# Patient Record
Sex: Male | Born: 1940
Health system: Southern US, Community
[De-identification: ages and names within clinical notes are randomized; demographics above are authoritative.]

## PROBLEM LIST (undated history)

## (undated) DIAGNOSIS — K760 Fatty (change of) liver, not elsewhere classified: Secondary | ICD-10-CM

## (undated) DIAGNOSIS — I6523 Occlusion and stenosis of bilateral carotid arteries: Secondary | ICD-10-CM

## (undated) DIAGNOSIS — K219 Gastro-esophageal reflux disease without esophagitis: Secondary | ICD-10-CM

## (undated) DIAGNOSIS — K229 Disease of esophagus, unspecified: Secondary | ICD-10-CM

## (undated) DIAGNOSIS — E785 Hyperlipidemia, unspecified: Secondary | ICD-10-CM

## (undated) DIAGNOSIS — K222 Esophageal obstruction: Secondary | ICD-10-CM

## (undated) DIAGNOSIS — R9439 Abnormal result of other cardiovascular function study: Secondary | ICD-10-CM

## (undated) DIAGNOSIS — G20 Parkinson's disease: Secondary | ICD-10-CM

## (undated) DIAGNOSIS — I2510 Atherosclerotic heart disease of native coronary artery without angina pectoris: Secondary | ICD-10-CM

## (undated) DIAGNOSIS — Z9889 Other specified postprocedural states: Secondary | ICD-10-CM

## (undated) DIAGNOSIS — T1490XA Injury, unspecified, initial encounter: Secondary | ICD-10-CM

## (undated) DIAGNOSIS — I4891 Unspecified atrial fibrillation: Secondary | ICD-10-CM

## (undated) DIAGNOSIS — I714 Abdominal aortic aneurysm, without rupture, unspecified: Secondary | ICD-10-CM

## (undated) DIAGNOSIS — K3189 Other diseases of stomach and duodenum: Secondary | ICD-10-CM

## (undated) DIAGNOSIS — Z930 Tracheostomy status: Secondary | ICD-10-CM

## (undated) DIAGNOSIS — S36039A Unspecified laceration of spleen, initial encounter: Secondary | ICD-10-CM

## (undated) DIAGNOSIS — R161 Splenomegaly, not elsewhere classified: Secondary | ICD-10-CM

## (undated) DIAGNOSIS — Z8719 Personal history of other diseases of the digestive system: Secondary | ICD-10-CM

## (undated) DIAGNOSIS — M1990 Unspecified osteoarthritis, unspecified site: Secondary | ICD-10-CM

## (undated) DIAGNOSIS — G20C Parkinsonism, unspecified: Secondary | ICD-10-CM

## (undated) HISTORY — DX: Tracheostomy status: Z93.0

## (undated) HISTORY — DX: Unspecified atrial fibrillation: I48.91

## (undated) HISTORY — DX: Other diseases of stomach and duodenum: K31.89

## (undated) HISTORY — DX: Occlusion and stenosis of bilateral carotid arteries: I65.23

## (undated) HISTORY — DX: Splenomegaly, not elsewhere classified: R16.1

## (undated) HISTORY — DX: Injury, unspecified, initial encounter: T14.90XA

## (undated) HISTORY — PX: CORONARY ARTERY BYPASS GRAFT: SHX141

## (undated) HISTORY — DX: Abdominal aortic aneurysm, without rupture, unspecified: I71.40

## (undated) HISTORY — PX: TONSILLECTOMY: SUR1361

## (undated) HISTORY — DX: Abdominal aortic aneurysm, without rupture: I71.4

## (undated) HISTORY — DX: Disease of esophagus, unspecified: K22.9

## (undated) HISTORY — DX: Hyperlipidemia, unspecified: E78.5

## (undated) HISTORY — DX: Other specified postprocedural states: Z98.890

## (undated) HISTORY — DX: Gastro-esophageal reflux disease without esophagitis: K21.9

## (undated) HISTORY — DX: Unspecified laceration of spleen, initial encounter: S36.039A

## (undated) HISTORY — DX: Atherosclerotic heart disease of native coronary artery without angina pectoris: I25.10

## (undated) HISTORY — DX: Abnormal result of other cardiovascular function study: R94.39

## (undated) HISTORY — PX: CARDIAC CATHETERIZATION: SHX172

## (undated) HISTORY — DX: Fatty (change of) liver, not elsewhere classified: K76.0

## (undated) HISTORY — DX: Esophageal obstruction: K22.2

---

## 1993-05-21 HISTORY — PX: INNER EAR SURGERY: SHX679

## 2002-05-21 HISTORY — PX: COLONOSCOPY: SHX174

## 2009-10-18 ENCOUNTER — Ambulatory Visit: Payer: Self-pay | Admitting: Gastroenterology

## 2009-10-18 ENCOUNTER — Ambulatory Visit (HOSPITAL_COMMUNITY): Admission: EM | Admit: 2009-10-18 | Discharge: 2009-10-18 | Payer: Self-pay | Admitting: Emergency Medicine

## 2009-10-19 ENCOUNTER — Encounter (INDEPENDENT_AMBULATORY_CARE_PROVIDER_SITE_OTHER): Payer: Self-pay | Admitting: *Deleted

## 2009-10-19 ENCOUNTER — Telehealth (INDEPENDENT_AMBULATORY_CARE_PROVIDER_SITE_OTHER): Payer: Self-pay

## 2009-10-21 ENCOUNTER — Ambulatory Visit: Payer: Self-pay | Admitting: Gastroenterology

## 2009-10-26 ENCOUNTER — Telehealth: Payer: Self-pay | Admitting: Gastroenterology

## 2009-10-26 ENCOUNTER — Ambulatory Visit: Payer: Self-pay | Admitting: Gastroenterology

## 2009-10-28 ENCOUNTER — Encounter: Payer: Self-pay | Admitting: Gastroenterology

## 2010-05-21 HISTORY — PX: EYE SURGERY: SHX253

## 2010-06-20 NOTE — Procedures (Signed)
Summary: Upper Endoscopy  Patient: Gregory [name] Note: All result statuses are Final unless otherwise noted.  Tests: (1) Upper Endoscopy (EGD)   EGD Upper Endoscopy       DONE     [facility]     [address]     [place], [place]  [address]           ENDOSCOPY PROCEDURE REPORT           PATIENT:  Gregory [name], Gregory [name]  MR#:  [id number]     BIRTHDATE:  February 16, 1941, 68 yrs. old  GENDER:  male     ENDOSCOPIST:  Judie Petit T. Russella Dar, MD, [facility]     Referred by:  Adela Lank, M.D.     PROCEDURE DATE:  10/18/2009     PROCEDURE:  EGD with foreign body removal     ASA CLASS:  Class II     INDICATIONS:  dysphagia, food impaction: Several weeks of solid     dysphagia and acute dysphagia unable to handle secretions since     Sunday evening     MEDICATIONS:  Fentanyl 75 mcg IV, Versed 7 mg IV     TOPICAL ANESTHETIC:  none     DESCRIPTION OF PROCEDURE:   After the risks benefits and     alternatives of the procedure were thoroughly explained, informed     consent was obtained.  The Pentax EG-[id number] endoscope was     introduced through the mouth and advanced to the second portion of     the duodenum, without limitations.  The instrument was slowly     withdrawn as the mucosa was fully examined.     PROCEDUREIMAGES     Retained food was present in the distal esophagus totally     obstructing the lumen. The food impaction was removed and the     endoscope advanced easily into the stomach. Severve esophagitis     was found in the distal esophagus, likely pressure related. It was     with circumfirential ulcerations and friable. A stricture was     found at the gastroesophageal junction. It was circumferential and     benign appearing. The stomach was entered and closely examined.     The antrum, angularis, and lesser curvature were well visualized,     including a retroflexed view of the cardia and fundus. The stomach     wall was normally distensable. The pylorus was deformed.  The scope     passed easily through the pylorus into the duodenum. The duodenal     bulb was normal in appearance, as was the postbulbar duodenum.     Retroflexed views revealed no abnormalities. The scope was then     withdrawn from the patient and the procedure completed.     COMPLICATIONS:  None           ENDOSCOPIC IMPRESSION:     1) Food, retained in the distal esophagus     2) Esophagitis in the distal esophagus     3) Stricture at the gastroesophageal junction     4) Deformed pylorus     RECOMMENDATIONS:     1) Anti-reflux regimen long term     2) PPI qam long term     3) EGD/Savary in 7-14 days     4) clear liquids until 0900 tomorrow and then only soft foods no     meat or bread until dilation  Venita Lick. Russella Dar, MD, [name]           n.     eSIGNED:   Venita Lick. Jakye Mullens at 10/18/2009 10:36 PM           [name], [id number]  Note: An exclamation mark (!) indicates a result that was not dispersed into the flowsheet. Document Creation Date: 10/19/2009 11:20 AM _______________________________________________________________________  (1) Order result status: Final Collection or observation date-time: 10/18/2009 22:12 Requested date-time:  Receipt date-time:  Reported date-time:  Referring Physician:   Ordering Physician: Claudette Head [phone]) Specimen Source:  Source: Launa Grill Order Number: [id number] Lab site:

## 2010-06-20 NOTE — Letter (Signed)
Summary: Patient [facility] Biopsy Results   Gastroenterology  [address] [place], [place] [address]   Phone: [phone]  Fax: [phone]        October 28, 2009 MRN: [id number]    FARID GRIGORIAN [address] [place], [place]  [address]    Dear Mr. Bezio,  I am pleased to inform you that the biopsies taken during your recent endoscopic examination did not show any evidence of cancer upon pathologic examination. The biopsies showed gastritis.  Continue with the treatment plan as outlined on the day of your      exam.  Please call us if you are having persistent problems or have questions about your condition that have not been fully answered at this time.  Sincerely,  Meryl Dare MD [facility]  This letter has been electronically signed by your physician.  Appended Document: Patient Notice-Endo Biopsy Results letter mailed.

## 2010-06-20 NOTE — Progress Notes (Signed)
Summary: med ?'s  Phone Note Call from Patient Call back at Home Phone [phone]   Caller: Patient Call For: Dr. Russella Dar Reason for Call: Talk to Nurse Summary of Call: EGD this morning and has med questions Initial call taken by: Vallarie Mare,  October 26, 2009 8:46 AM  Follow-up for Phone Call        pt. wanted to know if he should take prilosec this a.m. informed pt that he could take that after proedure is completed.  Follow-up by: Greer Ee RN,  October 26, 2009 8:49 AM

## 2010-06-20 NOTE — Procedures (Signed)
Summary: EGD/food impaction    EGD  Procedure date:  10/18/2009  Findings:      Location: [facility]    ENDOSCOPY PROCEDURE REPORT  PATIENT:  Gregory [name], Gregory [name]  MR#:  [id number] BIRTHDATE:   1940/08/06, 68 yrs. old   GENDER:   male ENDOSCOPIST:   Judie Petit T. Russella Dar, MD, [facility] Referred by: Adela Lank, M.D. PROCEDURE DATE:  10/18/2009 PROCEDURE:  EGD with foreign body removal ASA CLASS:   Class II INDICATIONS: dysphagia, food impaction: Several weeks of solid dysphagia and acute dysphagia unable to handle secretions since Sunday evening  MEDICATIONS:   Fentanyl 75 mcg IV, Versed 7 mg IV TOPICAL ANESTHETIC:   none DESCRIPTION OF PROCEDURE:   After the risks benefits and alternatives of the procedure were thoroughly explained, informed consent was obtained.  The Pentax EG-2770K endoscope was introduced through the mouth and advanced to the second portion of the duodenum, without limitations.  The instrument was slowly withdrawn as the mucosa was fully examined. <<PROCEDUREIMAGES>>          <<OLD IMAGES>> Retained food was present in the distal esophagus totally  obstructing the lumen. The food impaction was removed and the endoscope advanced easily into the stomach. Severve esophagitis was found in the distal esophagus, likely pressure related. It was with circumfirential ulcerations and friable. A stricture was found at the gastroesophageal junction. It was circumferential and benign appearing. The stomach was entered and closely examined. The antrum, angularis, and lesser curvature were well visualized, including a retroflexed view of the cardia and fundus. The stomach wall was normally distensable. The pylorus was deformed. The scope passed easily through the pylorus into the duodenum. The duodenal bulb was normal in appearance, as was the postbulbar duodenum. Retroflexed views revealed no abnormalities. The scope was then withdrawn from the patient and the procedure  completed. COMPLICATIONS:   None  ENDOSCOPIC IMPRESSION:  1) Food, retained in the distal esophagus  2) Esophagitis in the distal esophagus  3) Stricture at the gastroesophageal junction 4) Deformed pylorus RECOMMENDATIONS:  1) Anti-reflux regimen long term  2) PPI qam long term  3) EGD/Savary in 7-14 days 4) clear liquids until 0900 tomorrow and then only soft foods (no meat or bread) until dilation  Taksh Hjort T. Russella Dar, MD, [name]

## 2010-06-20 NOTE — Letter (Signed)
Summary: EGD Instructions  [facility]  [address] [place], [place] [address]   Phone: [phone]  Fax: [phone]       Gregory [name]    30-Nov-1940    MRN: [id number]       Procedure Day Dorna Bloom:  [facility]  10/26/09     Arrival Time: 1:30PM     Procedure Time:  2:30PM     Location of Procedure:                    [facility] (4th Floor)    PREPARATION FOR ENDOSCOPY   On 10/26/09 THE DAY OF THE PROCEDURE:  1.   No solid foods, milk or milk products are allowed after midnight the night before your procedure.  2.   Do not drink anything colored red or purple.  Avoid juices with pulp.  No orange juice.  3.  You may drink clear liquids until 12:30PM, which is 2 hours before your procedure.                                                                                                CLEAR LIQUIDS INCLUDE: Water Jello Ice Popsicles Tea (sugar ok, no milk/cream) Powdered fruit flavored drinks Coffee (sugar ok, no milk/cream) Gatorade Juice: apple, white grape, white cranberry  Lemonade Clear bullion, consomm, broth Carbonated beverages (any kind) Strained chicken noodle soup Hard Candy   MEDICATION INSTRUCTIONS  Unless otherwise instructed, you should take regular prescription medications with a small sip of water as early as possible the morning of your procedure.          OTHER INSTRUCTIONS  You will need a responsible adult at least 71 years of age to accompany you and drive you home.   This person must remain in the waiting room during your procedure.  Wear loose fitting clothing that is easily removed.  Leave jewelry and other valuables at home.  However, you may wish to bring a book to read or an iPod/MP3 player to listen to music as you wait for your procedure to start.  Remove all body piercing jewelry and leave at home.  Total time from sign-in until discharge is approximately 2-3 hours.  You should go home  directly after your procedure and rest.  You can resume normal activities the day after your procedure.  The day of your procedure you should not:   Drive   Make legal decisions   Operate machinery   Drink alcohol   Return to work  You will receive specific instructions about eating, activities and medications before you leave.    The above instructions have been reviewed and explained to me by   Wyona Almas RN  October 21, 2009 3:18 PM     I fully understand and can verbalize these instructions _____________________________ Date _________

## 2010-06-20 NOTE — Miscellaneous (Signed)
Summary: [facility] Previsit/prep  Clinical Lists Changes  Observations: Added new observation of NKA: T (10/21/2009 14:44)

## 2010-06-20 NOTE — Progress Notes (Signed)
Summary: Schedule repeat EGD  Phone Note Outgoing Call Call back at [facility] Phone [phone]   Call placed by: Darcey Nora RN, CGRN,  October 19, 2009 11:15 AM Call placed to: Patient Summary of Call: Patient  had food impaction and had EGD last pm by Dr Russella Dar.  Per [facility] report patient to be scheduled for repeat ESA in [facility], this is scheduled for 10/26/09 2:30 patient will come for a pre-visit 10/21/09 2:30 for instructions and paperwork.  Omeprazole rx will be sent to [facility] per patient request.  He will double up on his Prilosec for now.  Reviewed again with the patient and the wife, he needs to avoid meat and bread and stay on a soft diet. Initial call taken by: Darcey Nora RN, CGRN,  October 19, 2009 11:18 AM    New/Updated Medications: OMEPRAZOLE 40 MG CPDR (OMEPRAZOLE) 1 by mouth once daily 30 minutes before the first meal of the day Prescriptions: OMEPRAZOLE 40 MG CPDR (OMEPRAZOLE) 1 by mouth once daily 30 minutes before the first meal of the day  #90 x 3   Entered by:   Darcey Nora RN, CGRN   Authorized by:   Meryl Dare MD [facility]   Signed by:   Darcey Nora RN, CGRN on 10/19/2009   Method used:   Electronically to        [facility] [facility]* (mail-order)             ,          Ph: [phone]       Fax: [phone]   RxID:   [id number]

## 2010-06-20 NOTE — Procedures (Signed)
Summary: Upper Endoscopy w/DIL  Patient: [name] Note: All result statuses are Final unless otherwise noted.  Tests: (1) Upper Endoscopy w/DIL (UED)  UED Upper Endoscopy w/DIL                             DONE     [facility]     [address]     [place], [place]  [address]           ENDOSCOPY PROCEDURE REPORT           PATIENT:  Gregory [name], Gregory [name]  MR#:  [id number]     BIRTHDATE:  [birth year], 68 yrs. old  GENDER:  male     ENDOSCOPIST:  Judie Petit T. Russella Dar, MD, [facility]           PROCEDURE DATE:  10/26/2009     PROCEDURE:  EGD with biopsy, with dilatation over guidewire     ASA CLASS:  Class II     INDICATIONS:  1) stricture  2) dysphagia     MEDICATIONS:  Fentanyl 100 mcg IV, Versed 8 mg IV     TOPICAL ANESTHETIC:  Exactacain Spray     DESCRIPTION OF PROCEDURE:   After the risks benefits and     alternatives of the procedure were thoroughly explained, informed     consent was obtained.  The [id number] endoscope was     introduced through the mouth and advanced to the second portion of     the duodenum, without limitations.  The instrument was slowly     withdrawn as the mucosa was carefully examined.     <<PROCEDUREIMAGES>>     Moderate gastritis was found in the total stomach. It was granular     and erythematous. Biopsies of the antrum and body of the stomach     were obtained and sent to pathology.  Erythema was found in the     distal esophagus.  A stricture was found at the gastroesophageal     junction. It was circumferential and benign appearing. It was 14     mm in diameter. The duodenal bulb was normal in appearance, as was     the postbulbar duodenum.  A hiatal hernia was found in the cardia,     small.   Dilation was then performed at the gastroesphageal     junction:           Dilator:  Savary over guidewire  Sizes:  15, 16, 17 mm     Resistance:  minimal  Heme:  none           COMPLICATIONS:  None           ENDOSCOPIC IMPRESSION:     1)  Moderate diffuse gastritis     2) Erythema in the distal esophagus     3) 14 mm stricture at the gastroesophageal junction     4) Small hiatal hernia     RECOMMENDATIONS:     1) await pathology results     2) anti-reflux regimen long term     3) continue PPI qam long term     4) call office to schedule an office visit for 12 months     5) post dilation instructions           Briceida Rasberry T. Russella Dar, MD, [name]           CC:  Adela Lank, M.D.  n.     eSIGNED:   Artemis Koller T. Haris Baack at 10/26/2009 02:42 PM           [name], [id number]  Note: An exclamation mark (!) indicates a result that was not dispersed into the flowsheet. Document Creation Date: 10/26/2009 2:43 PM _______________________________________________________________________  (1) Order result status: Final Collection or observation date-time: 10/26/2009 14:36 Requested date-time:  Receipt date-time:  Reported date-time:  Referring Physician:   Ordering Physician: Claudette Head [phone]) Specimen Source:  Source: Launa Grill Order Number: [id number] Lab site:

## 2010-08-07 LAB — COMPREHENSIVE METABOLIC PANEL
AST: 21 U/L (ref 0–37)
Albumin: 4.2 g/dL (ref 3.5–5.2)
Alkaline Phosphatase: 71 U/L (ref 39–117)
BUN: 33 mg/dL — ABNORMAL HIGH (ref 6–23)
Calcium: 9.1 mg/dL (ref 8.4–10.5)
Chloride: 102 mEq/L (ref 96–112)
GFR calc Af Amer: 57 mL/min — ABNORMAL LOW (ref >60)
Glucose, Bld: 130 mg/dL — ABNORMAL HIGH (ref 70–99)
Sodium: 137 mEq/L (ref 135–145)

## 2010-08-07 LAB — CBC
MCHC: 34.3 g/dL (ref 30.0–36.0)
Platelets: 298 10*3/uL (ref 150–400)

## 2010-08-07 LAB — LIPASE, BLOOD: Lipase: 19 U/L (ref 11–59)

## 2010-08-07 LAB — DIFFERENTIAL
Basophils Absolute: 0 10*3/uL (ref 0.0–0.1)
Basophils Relative: 0 % (ref 0–1)
Neutrophils Relative %: 82 % — ABNORMAL HIGH (ref 43–77)

## 2010-08-31 ENCOUNTER — Other Ambulatory Visit: Payer: Self-pay | Admitting: Gastroenterology

## 2011-02-07 ENCOUNTER — Telehealth: Payer: Self-pay | Admitting: Gastroenterology

## 2011-02-07 NOTE — Telephone Encounter (Signed)
the patient is advised to try increase his omeprazole to bid, gas -x ac meals, Patient instructed to maintain an anti-reflux diet. Advised to avoid caffeine, mint, citrus foods/juices, tomatoes,  chocolate, NSAIDS/ASA products.  Instructed not to eat within 2 hours of exercise or bed, multiple small meals are better than 3 large meals.  Need to take PPI 30 minutes prior to 1st meal of the day and at bedtime.  He is asked to keep the appt with Dr Russella Dar on 03/06/11.  I will mail him an antireflux diet and measures.  [name]

## 2011-03-06 ENCOUNTER — Ambulatory Visit (INDEPENDENT_AMBULATORY_CARE_PROVIDER_SITE_OTHER): Payer: 59 | Admitting: Gastroenterology

## 2011-03-06 ENCOUNTER — Encounter: Payer: Self-pay | Admitting: Gastroenterology

## 2011-03-06 VITALS — BP 132/82 | HR 87 | Wt 213.4 lb

## 2011-03-06 DIAGNOSIS — K219 Gastro-esophageal reflux disease without esophagitis: Secondary | ICD-10-CM | POA: Insufficient documentation

## 2011-03-06 DIAGNOSIS — R079 Chest pain, unspecified: Secondary | ICD-10-CM

## 2011-03-06 MED ORDER — OMEPRAZOLE 40 MG PO CPDR: 40.0000 mg | DELAYED_RELEASE_CAPSULE | Freq: Two times a day (BID) | ORAL | Status: DC

## 2011-03-06 NOTE — Patient Instructions (Signed)
Your prescription for omeprazole 40 mg to take one tablet by mouth twice daily has been sent to your pharmacy.  Please keep your Cardiology appointment for further evaluation of your chest pain.  cc: Darci Needle, MD

## 2011-03-06 NOTE — Progress Notes (Signed)
History of Present Illness: This is a 70 year old male who has a history of GERD, an esophageal stricture and a prior food impaction. He underwent upper endoscopy with dilation last year. His reflux symptoms have generally been under good control with occasional breakthrough symptoms related to tomato sauce and other dietary stressors. He has had several episodes of nighttime reflux mainly following dietary stressors. He describes it as a tightness across his chest that awoke him on several occasions. He also has had similar symptoms related to exertion during the daytime. He was evaluated by Dr. Juleen China, his primary physician, and was referred to see me and [facility] cardiology. He has not had his cardiology appointment. His omeprazole was increased from 40 mg daily to 40 mg twice daily and his symptoms have improved but not resolved. Denies weight loss, abdominal pain, constipation, diarrhea, change in stool caliber, melena, hematochezia, nausea, vomiting, dysphagia.  Current Medications, Allergies, Past Medical History, Past Surgical History, Family History and Social History were reviewed in [employer] record.  Physical Exam: General: Well developed , well nourished, no acute distress Head: Normocephalic and atraumatic Eyes:  sclerae anicteric, EOMI Ears: Normal auditory acuity Mouth: No deformity or lesions Lungs: Clear throughout to auscultation Heart: Regular rate and rhythm; no murmurs, rubs or bruits Abdomen: Soft, non tender and non distended. No masses, hepatosplenomegaly or hernias noted. Normal Bowel sounds Musculoskeletal: Symmetrical with no gross deformities  Pulses:  Normal pulses noted Extremities: No clubbing, cyanosis, edema or deformities noted Neurological: Alert oriented x 4, grossly nonfocal Psychological:  Alert and cooperative. Normal mood and affect  Assessment and Recommendations:  1. GERD with a history of an esophageal stricture. One  component of his chest pain appears to be related to GERD. Continue omeprazole 40 mg twice daily and all standard antireflux measures. May use TUMS or Maalox when necessary.  2. Chest pain. One component is exertional and needs further evaluation by cardiology. I strongly recommended that he sees his cardiologist soon as possible.

## 2011-08-01 ENCOUNTER — Other Ambulatory Visit: Payer: Self-pay | Admitting: Endocrinology

## 2011-08-01 DIAGNOSIS — R1013 Epigastric pain: Secondary | ICD-10-CM

## 2011-08-01 DIAGNOSIS — K219 Gastro-esophageal reflux disease without esophagitis: Secondary | ICD-10-CM

## 2011-08-03 ENCOUNTER — Ambulatory Visit
Admission: RE | Admit: 2011-08-03 | Discharge: 2011-08-03 | Disposition: A | Discharge status: Home / Self Care | Payer: 59 | Source: Ambulatory Visit | Attending: Endocrinology | Admitting: Endocrinology

## 2011-08-03 DIAGNOSIS — R1013 Epigastric pain: Secondary | ICD-10-CM

## 2011-08-03 DIAGNOSIS — K219 Gastro-esophageal reflux disease without esophagitis: Secondary | ICD-10-CM

## 2011-08-28 ENCOUNTER — Telehealth: Payer: Self-pay | Admitting: Gastroenterology

## 2011-08-28 NOTE — Telephone Encounter (Signed)
Patient reported that he is having continued reflux.  He wants to schedule an office visit with Dr Russella Dar.  I have scheduled an appt for 09/19/11

## 2011-09-19 ENCOUNTER — Encounter: Payer: Self-pay | Admitting: Gastroenterology

## 2011-09-19 ENCOUNTER — Ambulatory Visit (INDEPENDENT_AMBULATORY_CARE_PROVIDER_SITE_OTHER): Payer: 59 | Admitting: Gastroenterology

## 2011-09-19 VITALS — BP 120/72 | HR 64 | Ht 72.0 in | Wt 208.5 lb

## 2011-09-19 DIAGNOSIS — R079 Chest pain, unspecified: Secondary | ICD-10-CM

## 2011-09-19 DIAGNOSIS — K219 Gastro-esophageal reflux disease without esophagitis: Secondary | ICD-10-CM

## 2011-09-19 MED ORDER — RANITIDINE HCL 150 MG PO TABS: 300.0000 mg | ORAL_TABLET | Freq: Every day | ORAL | Status: DC

## 2011-09-19 MED ORDER — DEXLANSOPRAZOLE 60 MG PO CPDR: 60.0000 mg | DELAYED_RELEASE_CAPSULE | Freq: Every day | ORAL | Status: DC

## 2011-09-19 NOTE — Progress Notes (Signed)
History of Present Illness: This is a 71 year old male here today his wife. He complains of frequent postprandial chest burning and grafting he describes diffusely across his chest. The symptoms come on frequently after meals and occasionally at night. He also has similar symptoms with exertion and movement. He tried Zegerid for 10 days and his symptoms improved significantly however the prescription was $362 and he could not afford it. 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 EGD showed distal esophageal erythema, peptic stricture, a small hernia and moderate chronic gastritis. He notes increased intestinal gas, and loud rumbling bowel sounds and flatulence. 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 Denies weight loss, abdominal pain, constipation, diarrhea, change in stool caliber, melena, hematochezia, nausea, vomiting, dysphagia.  Current Medications, Allergies, Past Medical History, Past Surgical History, Family History and Social History were reviewed in [employer] record.  Physical Exam: General: Well developed , well nourished, no acute distress Head: Normocephalic and atraumatic Eyes:  sclerae anicteric, EOMI Ears: Normal auditory acuity Mouth: No deformity or lesions Lungs: Clear throughout to auscultation Heart: Regular rate and rhythm; no murmurs, rubs or bruits Abdomen: Soft, non tender and non distended. No masses, hepatosplenomegaly or hernias noted. Normal Bowel sounds Musculoskeletal: Symmetrical with no gross deformities  Pulses:  Normal pulses noted Extremities: No clubbing, cyanosis, edema or deformities noted Neurological: Alert oriented x 4, grossly nonfocal Psychological:  Alert and cooperative. Mildly anxious, otherwise normal mood and affect  Assessment and Recommendations:  1. GERD. Chest pain. His symptoms appear to be primarily reflux related but he does have an exertional and movement  component to his chest symptoms. Discontinue omeprazole and begin Dexilant 60 mg every morning and ranitidine 300 mg every evening. TUMS as needed. Follow antireflux measures. Proceed with EGD if his symptoms are not adequately controlled. Many further evaluation by Dr. Juleen China other causes of chest symptoms related to exertion and movement.  2. Gas, borborygmi and flatulence. Low gas diet and Gas-X 4 times a day when necessary.  3. AAA. Per Dr. Juleen China.  4. Fatty liver. Long term low fat diet at weight loss program supervised by Dr. Juleen China.  5. Mild splenomegaly. Etiolgy unclear.

## 2011-09-19 NOTE — Patient Instructions (Addendum)
We have sent the following medications to your pharmacy for you to pick up at your convenience: Zantac 300 mg to take at bedtime. Stop taking Prilosec and start Dexilant samples one tablet by mouth every morning x 4 weeks.   cc: Darci Needle, MD

## 2011-10-09 ENCOUNTER — Telehealth: Payer: Self-pay | Admitting: Gastroenterology

## 2011-10-10 NOTE — Telephone Encounter (Signed)
Samples left out front for patient to pick up and pt notified.

## 2011-10-22 ENCOUNTER — Ambulatory Visit (INDEPENDENT_AMBULATORY_CARE_PROVIDER_SITE_OTHER): Payer: 59 | Admitting: Gastroenterology

## 2011-10-22 ENCOUNTER — Encounter: Payer: Self-pay | Admitting: Gastroenterology

## 2011-10-22 VITALS — BP 120/68 | HR 80 | Ht 72.0 in | Wt 207.1 lb

## 2011-10-22 DIAGNOSIS — R079 Chest pain, unspecified: Secondary | ICD-10-CM

## 2011-10-22 DIAGNOSIS — K219 Gastro-esophageal reflux disease without esophagitis: Secondary | ICD-10-CM

## 2011-10-22 DIAGNOSIS — Z1211 Encounter for screening for malignant neoplasm of colon: Secondary | ICD-10-CM

## 2011-10-22 MED ORDER — DEXLANSOPRAZOLE 60 MG PO CPDR: 60.0000 mg | DELAYED_RELEASE_CAPSULE | Freq: Every day | ORAL | Status: DC

## 2011-10-22 NOTE — Progress Notes (Signed)
History of Present Illness: This is a 71 year old male who has had improvement in his epigastric pain and chest pain on Dexilant in the morning and ranitidine in the evening. However he has had occasional symptoms that are often relieved by belching or passing flatus. He cannot locate records from his last colonoscopy but states it was performed in [place] over 10 years ago.  Current Medications, Allergies, Past Medical History, Past Surgical History, Family History and Social History were reviewed in [employer] record.  Physical Exam: General: Well developed , well nourished, no acute distress Head: Normocephalic and atraumatic Eyes:  sclerae anicteric, EOMI Ears: Normal auditory acuity Mouth: No deformity or lesions Lungs: Clear throughout to auscultation Heart: Regular rate and rhythm; no murmurs, rubs or bruits Abdomen: Soft, non tender and non distended. No masses, hepatosplenomegaly or hernias noted. Normal Bowel sounds Musculoskeletal: Symmetrical with no gross deformities  Pulses:  Normal pulses noted Extremities: No clubbing, cyanosis, edema or deformities noted Neurological: Alert oriented x 4, grossly nonfocal Psychological:  Alert and cooperative. Normal mood and affect  Assessment and Recommendations:  1. Chest pain and epigastric pain. A component of his symptoms appears to be related to GERD and intestinal gas. There may be other factors and he will return to Dr. Juleen China for further evaluation. Continue current medications. Continue long-term antireflux measures. May use Mylanta II or Gaviscon as needed. If these measures do not adequately control his symptoms we can proceed with an EGD at the time of his colonoscopy.  2. Colorectal cancer screening, average risk. I recommended that he proceed with colonoscopy at this time and would like to wait 1-2 months before scheduling.

## 2011-10-22 NOTE — Patient Instructions (Addendum)
We have sent the following medications to your pharmacy for you to pick up at your convenience: Dexilant, please take 1 capsule daily every morning.  You are due for a colonoscopy in August 2013. Please call to schedule with our office.   cc: Adela Lank, MD

## 2011-11-08 ENCOUNTER — Other Ambulatory Visit: Payer: Self-pay | Admitting: Gastroenterology

## 2011-11-30 ENCOUNTER — Other Ambulatory Visit: Payer: Self-pay | Admitting: Gastroenterology

## 2011-11-30 DIAGNOSIS — K219 Gastro-esophageal reflux disease without esophagitis: Secondary | ICD-10-CM

## 2011-11-30 MED ORDER — DEXLANSOPRAZOLE 60 MG PO CPDR: 60.0000 mg | DELAYED_RELEASE_CAPSULE | Freq: Every day | ORAL | Status: DC

## 2011-11-30 NOTE — Telephone Encounter (Signed)
Patient needs prescription sent to [facility] in place of his omeprazole. Prescription sent and samples left up front for patient to pick up until he receives his prescription from [facility].

## 2011-12-25 ENCOUNTER — Telehealth: Payer: Self-pay | Admitting: Gastroenterology

## 2011-12-25 MED ORDER — DEXLANSOPRAZOLE 60 MG PO CPDR: 60.0000 mg | DELAYED_RELEASE_CAPSULE | Freq: Every day | ORAL | Status: DC

## 2011-12-25 NOTE — Telephone Encounter (Signed)
Patient left his Dexilant on a trip to [place].  I have provided him samples.  He will come pick these up at the front desk

## 2012-05-21 DIAGNOSIS — I4891 Unspecified atrial fibrillation: Secondary | ICD-10-CM

## 2012-05-21 HISTORY — DX: Unspecified atrial fibrillation: I48.91

## 2012-06-12 ENCOUNTER — Other Ambulatory Visit: Payer: Self-pay | Admitting: Endocrinology

## 2012-06-12 DIAGNOSIS — I714 Abdominal aortic aneurysm, without rupture, unspecified: Secondary | ICD-10-CM

## 2012-07-21 ENCOUNTER — Ambulatory Visit
Admission: RE | Admit: 2012-07-21 | Discharge: 2012-07-21 | Disposition: A | Discharge status: Home / Self Care | Payer: 59 | Source: Ambulatory Visit | Attending: Endocrinology | Admitting: Endocrinology

## 2012-07-21 DIAGNOSIS — I714 Abdominal aortic aneurysm, without rupture: Secondary | ICD-10-CM

## 2012-08-07 ENCOUNTER — Other Ambulatory Visit: Payer: 59

## 2012-08-08 DIAGNOSIS — R9439 Abnormal result of other cardiovascular function study: Secondary | ICD-10-CM

## 2012-08-08 HISTORY — DX: Abnormal result of other cardiovascular function study: R94.39

## 2012-08-12 DIAGNOSIS — Z9889 Other specified postprocedural states: Secondary | ICD-10-CM

## 2012-08-12 HISTORY — DX: Other specified postprocedural states: Z98.890

## 2012-08-13 ENCOUNTER — Ambulatory Visit: Payer: Self-pay | Admitting: Neurology

## 2012-08-14 DIAGNOSIS — I6523 Occlusion and stenosis of bilateral carotid arteries: Secondary | ICD-10-CM

## 2012-08-14 HISTORY — DX: Occlusion and stenosis of bilateral carotid arteries: I65.23

## 2012-08-19 ENCOUNTER — Encounter (HOSPITAL_COMMUNITY)
Admission: RE | Disposition: A | Discharge status: Home / Self Care | Payer: Self-pay | Source: Ambulatory Visit | Attending: Cardiology

## 2012-08-19 ENCOUNTER — Ambulatory Visit (HOSPITAL_COMMUNITY)
Admission: RE | Admit: 2012-08-19 | Discharge: 2012-08-19 | Disposition: A | Discharge status: Home / Self Care | Payer: Medicare Other | Source: Ambulatory Visit | Attending: Cardiology | Admitting: Cardiology

## 2012-08-19 DIAGNOSIS — Z951 Presence of aortocoronary bypass graft: Secondary | ICD-10-CM | POA: Insufficient documentation

## 2012-08-19 DIAGNOSIS — I10 Essential (primary) hypertension: Secondary | ICD-10-CM | POA: Insufficient documentation

## 2012-08-19 DIAGNOSIS — I2510 Atherosclerotic heart disease of native coronary artery without angina pectoris: Secondary | ICD-10-CM | POA: Insufficient documentation

## 2012-08-19 DIAGNOSIS — I4891 Unspecified atrial fibrillation: Secondary | ICD-10-CM | POA: Insufficient documentation

## 2012-08-19 DIAGNOSIS — R079 Chest pain, unspecified: Secondary | ICD-10-CM | POA: Insufficient documentation

## 2012-08-19 HISTORY — PX: LEFT HEART CATHETERIZATION WITH CORONARY ANGIOGRAM: SHX5451

## 2012-08-19 LAB — CBC
Hemoglobin: 14.7 g/dL (ref 13.0–17.0)
RBC: 4.98 MIL/uL (ref 4.22–5.81)

## 2012-08-19 LAB — APTT: aPTT: 42 seconds — ABNORMAL HIGH (ref 24–37)

## 2012-08-19 LAB — BASIC METABOLIC PANEL
GFR calc Af Amer: 76 mL/min — ABNORMAL LOW (ref >90)
GFR calc non Af Amer: 66 mL/min — ABNORMAL LOW (ref >90)
Potassium: 5.1 mEq/L (ref 3.5–5.1)
Sodium: 142 mEq/L (ref 135–145)

## 2012-08-19 LAB — PROTIME-INR: Prothrombin Time: 15.1 seconds (ref 11.6–15.2)

## 2012-08-19 SURGERY — LEFT HEART CATHETERIZATION WITH CORONARY ANGIOGRAM
Anesthesia: LOCAL

## 2012-08-19 MED ORDER — HEPARIN (PORCINE) IN NACL 2-0.9 UNIT/ML-% IJ SOLN
INTRAMUSCULAR | Status: AC
  Filled 2012-08-19: qty 1000

## 2012-08-19 MED ORDER — SODIUM CHLORIDE 0.9 % IV SOLN
INTRAVENOUS | Status: DC
  Administered 2012-08-19: 10:00:00 via INTRAVENOUS

## 2012-08-19 MED ORDER — SODIUM CHLORIDE 0.9 % IV SOLN: 250.0000 mL | INTRAVENOUS | Status: DC | PRN

## 2012-08-19 MED ORDER — LIDOCAINE HCL (PF) 1 % IJ SOLN
INTRAMUSCULAR | Status: AC
  Filled 2012-08-19: qty 30

## 2012-08-19 MED ORDER — ASPIRIN 81 MG PO CHEW
324.0000 mg | CHEWABLE_TABLET | ORAL | Status: AC
  Administered 2012-08-19: 324 mg via ORAL

## 2012-08-19 MED ORDER — SODIUM CHLORIDE 0.9 % IJ SOLN: 3.0000 mL | INTRAMUSCULAR | Status: DC | PRN

## 2012-08-19 MED ORDER — MIDAZOLAM HCL 2 MG/2ML IJ SOLN
INTRAMUSCULAR | Status: AC
  Filled 2012-08-19: qty 2

## 2012-08-19 MED ORDER — ASPIRIN 81 MG PO CHEW
CHEWABLE_TABLET | ORAL | Status: AC
  Administered 2012-08-19: 324 mg via ORAL
  Filled 2012-08-19: qty 4

## 2012-08-19 MED ORDER — VERAPAMIL HCL 2.5 MG/ML IV SOLN
INTRAVENOUS | Status: AC
  Filled 2012-08-19: qty 2

## 2012-08-19 MED ORDER — HYDROMORPHONE HCL PF 2 MG/ML IJ SOLN
INTRAMUSCULAR | Status: AC
  Filled 2012-08-19: qty 1

## 2012-08-19 MED ORDER — SODIUM CHLORIDE 0.9 % IJ SOLN: 3.0000 mL | Freq: Two times a day (BID) | INTRAMUSCULAR | Status: DC

## 2012-08-19 MED ORDER — NITROGLYCERIN 1 MG/10 ML FOR IR/CATH LAB
INTRA_ARTERIAL | Status: AC
  Filled 2012-08-19: qty 10

## 2012-08-19 NOTE — H&P (Signed)
  Please see office visit notes for complete details of HPI.  

## 2012-08-19 NOTE — Interval H&P Note (Signed)
History and Physical Interval Note:  08/19/2012 11:12 AM  Gregory [name]  has presented today for surgery, with the diagnosis of Chest pain  The various methods of treatment have been discussed with the patient and family. After consideration of risks, benefits and other options for treatment, the patient has consented to  Procedure(s): LEFT HEART CATHETERIZATION WITH CORONARY ANGIOGRAM (N/A) and possible angioplasty as a surgical intervention .  The patient's history has been reviewed, patient examined, no change in status, stable for surgery.  I have reviewed the patient's chart and labs.  Questions were answered to the patient's satisfaction.     [name]

## 2012-08-19 NOTE — CV Procedure (Signed)
Procedure performed:  Left heart catheterization including hemodynamic monitoring of the left ventricle, LV gram, selective right and left coronary arteriography.  Indication patient is a 72 year-old fairly active male with history of hypertension,  hyperlipidemia,  who presents with new onset A. Fibrillation. Patient has  had non invasive testing which was abnormal.  Lexiscan  Stress revealed chest pain,. Dyspnea and abnormal EKG response and perfusion imaging studies demonstrated severe lateral wall ischemia. Hence is brought to the cardiac catheterization lab to evaluate the  coronary anatomy for definitive diagnosis of CAD.  Hemodynamic data:  Left ventricular pressure was 122/9 with LVEDP of 14 mm mercury. Aortic pressure was 118/50 with a mean of 72 mm mercury. There was no pressure gradient across the aortic valve  Left ventricle: Performed in the RAO projection revealed LVEF of 60%. There was No significant  MR. No wall motion abnormality.  Right coronary artery: The vessel is small, with mild diffuse disease, nondominant.  Left main coronary artery is large mildly calcified with at most eccentric 20% stenosis.  Circumflex coronary artery: It is a very large caliber vessel and a dominant vessel. It supplied territory. It is occluded in the proximal segment. The occlusion is calcific occlusion. The distal segment of the circumflex coronary artery is collateralized by the septal perforators from the LAD. The circumflex coronary artery appears to do origin to a very high OM 1 which in some views appeared to be a very small ramus intermediate which has ostial severe disease and is subtotally occluded in its very small. OM 2 is moderate caliber vessel with a proximal 30-40% stenosis. Rest of the circumflex coronary vessel looks widely patent.  LAD:  LAD gives origin to a large diagonal-1.  LAD has mild luminal irregularities. There is proximal tortuosity evident in the LAD and forms a semi-loop  at the origin. It is calcified mild to moderately.  Recommendation: Patient is extremely complex with regard to anatomy. Both percutaneous revascularization and single-vessel CABG are good options, I have discussed the options with her colleagues. I will again discuss with the patient and his family before making a final recommendation. Single vessel CABG to the circumflex coronary artery could be done under relatively low risk in a patient who is otherwise healthy. However a percutaneous revascularization can be safely be performed, albeit, complex, we suspect he may have long-term do result with the option for CABG if restenosis were to occur. I already discussed with the patient's family regarding the possible options and I will get back with them regarding this.  Technique: Under sterile precautions using a 6 French right radial  arterial access, a 6 French sheath was introduced into the right radial artery. A 5 [place] Tig 4 catheter was advanced into the ascending aorta selective  right coronary artery and left coronary artery was cannulated and angiography was performed in multiple views. The catheter was pulled back Out of the body over exchange length J-wire.  5 f pig tail Catheter was used to perform LV gram which was performed in RAO projection. Catheter exchanged out of the body over J-Wire. NO immediate complications noted. Patient tolerated the procedure well. Hemostasis was obtained by applying TR band.  Disposition: Will be discharged home today with outpatient follow up.

## 2012-08-26 ENCOUNTER — Ambulatory Visit: Payer: Self-pay | Admitting: Neurology

## 2012-08-28 ENCOUNTER — Encounter: Payer: Self-pay | Admitting: *Deleted

## 2012-08-28 DIAGNOSIS — E785 Hyperlipidemia, unspecified: Secondary | ICD-10-CM | POA: Insufficient documentation

## 2012-08-28 DIAGNOSIS — R9439 Abnormal result of other cardiovascular function study: Secondary | ICD-10-CM | POA: Insufficient documentation

## 2012-08-28 DIAGNOSIS — K219 Gastro-esophageal reflux disease without esophagitis: Secondary | ICD-10-CM | POA: Insufficient documentation

## 2012-08-28 DIAGNOSIS — Z9889 Other specified postprocedural states: Secondary | ICD-10-CM | POA: Insufficient documentation

## 2012-08-29 ENCOUNTER — Encounter: Payer: Self-pay | Admitting: Gastroenterology

## 2012-08-29 ENCOUNTER — Encounter: Payer: 59 | Admitting: Surgery

## 2012-09-09 ENCOUNTER — Encounter: Payer: Self-pay | Admitting: Surgery

## 2012-09-09 ENCOUNTER — Institutional Professional Consult (permissible substitution) (INDEPENDENT_AMBULATORY_CARE_PROVIDER_SITE_OTHER): Payer: Medicare Other | Admitting: Surgery

## 2012-09-09 VITALS — BP 104/61 | HR 64 | Resp 20 | Ht 70.5 in | Wt 207.0 lb

## 2012-09-09 DIAGNOSIS — I2510 Atherosclerotic heart disease of native coronary artery without angina pectoris: Secondary | ICD-10-CM

## 2012-09-09 NOTE — Progress Notes (Signed)
[address]       [place] [address]             [phone]      PCP is Michiel Sites, MD Referring Provider is [name], MD  Chief Complaint  Patient presents with  . Coronary Artery Disease    Surgical eval for CAD, cardiac cath 08/19/12     HPI:  The patient is a 72 year old gentleman with a history of hyperlipidemia who was recently found to be in atrial fibrillation with a rapid ventricular response during a routine physical examination. The patient was referred to Dr. Jacinto Halim for further evaluation and the patient and his wife related at least a six-month history of exertional fatigue and a pressure sensation in his chest that he calls a "grab". This occurs with a mild level of exertion and resolves with rest. He has also noticed symptoms after eating. He thought that this was likely related to reflux since he has had previous esophageal dilatation for esophageal stricture. A 2-D echocardiogram from 08/13/2012 showed a normal left ventricular ejection fraction with moderate left ventricular hypertrophy and mild mitral regurgitation. He also underwent a Lexiscan stress test on 08/08/2012 which showed left ventricular dilatation with stress and a large inferior and lateral area of severe ischemia. He was started on Pradaxa for atrial fibrillation. He subsequently underwent cardiac catheterization on 08/19/2012. This showed a dominant left circumflex coronary artery that is occluded just after the ostium and filled by collaterals from the septal perforators of the LAD. There is a small ramus that was subtotally occluded and then a moderate-sized marginal branch with 30-40% proximal stenosis. The remainder of the left circumflex was widely patent and filled slowly. The right coronary was a small, nondominant vessel that was diffusely diseased. The LAD was a large vessel with mild luminal irregularities. There is a large first diagonal branch that had no  significant stenosis. Left ventricular ejection fraction is about 60% with no mitral regurgitation.   Past Medical History  Diagnosis Date  . Esophageal stricture   . Deformed pylorus, acquired   . Erythema of esophagus   . Hiatal hernia   . AAA (abdominal aortic aneurysm)   . Fatty infiltration of liver   . Splenomegaly   . AF (atrial fibrillation) 2014  . S/P cardiac cath 08/12/12  . Abnormal stress test 08/08/12  . Hyperlipidemia   . Bilateral carotid artery stenosis 08/14/12    moderate bilaterally per carotid duplex  . CAD (coronary artery disease)   . GERD (gastroesophageal reflux disease)     Past Surgical History  Procedure Laterality Date  . Inner ear surgery  1995    Dr. Soyla Murphy...placement of shunt  . Eye surgery  2012    left cataract extraction  . Colonoscopy  2004    Family History  Problem Relation Age of Onset  . Heart disease Father     died from  . Kidney disease Mother   . Stroke Brother     Social History History  Substance Use Topics  . Smoking status: Former Smoker    Quit date: 08/29/1987  . Smokeless tobacco: Never Used  . Alcohol Use: No    Current Outpatient Prescriptions  Medication Sig Dispense Refill  . aspirin 81 MG tablet Take 81 mg by mouth daily.      [name] atorvastatin (LIPITOR) 80 MG tablet Take 80 mg by mouth daily.      . calcium carbonate (TUMS - DOSED  IN MG ELEMENTAL CALCIUM) 500 MG chewable tablet Chew 2 tablets by mouth 3 (three) times daily as needed for heartburn.      . dabigatran (PRADAXA) 150 MG CAPS Take 150 mg by mouth every 12 (twelve) hours.      . metoprolol tartrate (LOPRESSOR) 25 MG tablet Take 25 mg by mouth 2 (two) times daily.       No current facility-administered medications for this visit.    No Known Allergies  Review of Systems  Constitutional: Positive for activity change and fatigue. Negative for appetite change.  HENT: Positive for nosebleeds.        Mild since starting Pradaxa   Eyes: Negative.    Respiratory: Positive for chest tightness. Negative for shortness of breath.   Cardiovascular: Positive for chest pain and leg swelling. Negative for palpitations.  Gastrointestinal: Negative.   Endocrine: Negative.   Genitourinary: Negative.   Allergic/Immunologic: Negative.   Neurological: Negative.   Hematological: Negative.   Psychiatric/Behavioral: Negative.     BP 104/61  Pulse 64  Resp 20  Ht 5' 10.5" (1.791 m)  Wt 270 lb (122.471 kg)  BMI 38.18 kg/m2  SpO2 97% Physical Exam  Constitutional: He is oriented to person, place, and time. He appears well-developed and well-nourished. No distress.  HENT:  Head: Normocephalic and atraumatic.  Mouth/Throat: Oropharynx is clear and moist.  Eyes: Conjunctivae and EOM are normal. Pupils are equal, round, and reactive to light.  Neck: Normal range of motion. Neck supple. No JVD present. No thyromegaly present.  Cardiovascular: Normal rate, regular rhythm and intact distal pulses.  Exam reveals no gallop and no friction rub.   No murmur heard. Pulmonary/Chest: Effort normal and breath sounds normal. No respiratory distress. He has no wheezes. He has no rales.  Abdominal: Soft. Bowel sounds are normal. He exhibits no distension and no mass. There is no tenderness.  Musculoskeletal: Normal range of motion. He exhibits edema.  Mild in ankles bilaterally.  Lymphadenopathy:    He has no cervical adenopathy.  Neurological: He is alert and oriented to person, place, and time. He has normal strength. No sensory deficit.  Skin: Skin is warm and dry.  Psychiatric: He has a normal mood and affect.     Diagnostic Tests:  Cardiac Cath:  Hemodynamic data:  Left ventricular pressure was 122/9 with LVEDP of 14 mm mercury. Aortic pressure was 118/50 with a mean of 72 mm mercury. There was no pressure gradient across the aortic valve  Left ventricle: Performed in the RAO projection revealed LVEF of 60%. There was No significant MR. No wall  motion abnormality.  Right coronary artery: The vessel is small, with mild diffuse disease, nondominant.  Left main coronary artery is large mildly calcified with at most eccentric 20% stenosis.  Circumflex coronary artery: It is a very large caliber vessel and a dominant vessel. It supplied territory. It is occluded in the proximal segment. The occlusion is calcific occlusion. The distal segment of the circumflex coronary artery is collateralized by the septal perforators from the LAD. The circumflex coronary artery appears to do origin to a very high OM 1 which in some views appeared to be a very small ramus intermediate which has ostial severe disease and is subtotally occluded in its very small. OM 2 is moderate caliber vessel with a proximal 30-40% stenosis. Rest of the circumflex coronary vessel looks widely patent.  LAD: LAD gives origin to a large diagonal-1. LAD has mild luminal irregularities. There is proximal tortuosity  evident in the LAD and forms a semi-loop at the origin. It is calcified mild to moderately.     Impression:  He has an occluded dominant left circumflex coronary filling by collaterals with significant exertional fatigue and chest discomfort. His nuclear stress test shows a large area of lateral and inferior ischemia corresponding to this distribution. He is in overall good health and has remained quite active until recently when he has had to slow down due to the chest discomfort and fatigue. I think percutaneous intervention on the ostial and proximal left circumflex would be fairly risky due to the possibility of problems with the left main coronary artery and the need to stent back into the left main. I think his risk for coronary bypass graft surgery would be very low and would give him a good long-term prognosis and a full functional recovery. I discussed the operative procedure with the patient and family including alternatives, benefits and risks; including but not  limited to bleeding, blood transfusion, infection, stroke, myocardial infarction, graft failure, heart block requiring a permanent pacemaker, organ dysfunction, and death. I told them that he may or may not continue to have atrial fibrillation postop and may need to stay on anticoagulants.  [place] Jon Billings understands and agrees to proceed.  We will schedule surgery for Tuesday 09/16/12.  Plan:  We will plan surgery for Tuesday 09/16/12. He will discontinue the Pradaxa now and continue aspirin at 81 mg per day.

## 2012-09-10 ENCOUNTER — Other Ambulatory Visit: Payer: Self-pay | Admitting: *Deleted

## 2012-09-10 DIAGNOSIS — I2510 Atherosclerotic heart disease of native coronary artery without angina pectoris: Secondary | ICD-10-CM

## 2012-09-11 ENCOUNTER — Telehealth: Payer: Self-pay | Admitting: *Deleted

## 2012-09-11 ENCOUNTER — Encounter: Payer: Self-pay | Admitting: Surgery

## 2012-09-12 ENCOUNTER — Encounter (HOSPITAL_COMMUNITY)
Admission: RE | Admit: 2012-09-12 | Discharge: 2012-09-12 | Disposition: A | Discharge status: Home / Self Care | Payer: Medicare Other | Source: Ambulatory Visit | Attending: Surgery | Admitting: Surgery

## 2012-09-12 ENCOUNTER — Ambulatory Visit (HOSPITAL_COMMUNITY)
Admission: RE | Admit: 2012-09-12 | Discharge: 2012-09-12 | Disposition: A | Discharge status: Home / Self Care | Payer: Medicare Other | Source: Ambulatory Visit | Attending: Surgery | Admitting: Surgery

## 2012-09-12 ENCOUNTER — Encounter (HOSPITAL_COMMUNITY): Payer: Self-pay

## 2012-09-12 VITALS — BP 138/80 | HR 48 | Temp 98.0°F | Resp 20 | Ht 70.5 in | Wt 209.6 lb

## 2012-09-12 DIAGNOSIS — Z01810 Encounter for preprocedural cardiovascular examination: Secondary | ICD-10-CM

## 2012-09-12 DIAGNOSIS — I2510 Atherosclerotic heart disease of native coronary artery without angina pectoris: Secondary | ICD-10-CM

## 2012-09-12 DIAGNOSIS — Z01812 Encounter for preprocedural laboratory examination: Secondary | ICD-10-CM | POA: Insufficient documentation

## 2012-09-12 DIAGNOSIS — Z01818 Encounter for other preprocedural examination: Secondary | ICD-10-CM | POA: Insufficient documentation

## 2012-09-12 LAB — COMPREHENSIVE METABOLIC PANEL
AST: 18 U/L (ref 0–37)
BUN: 18 mg/dL (ref 6–23)
CO2: 23 mEq/L (ref 19–32)
Chloride: 103 mEq/L (ref 96–112)
Creatinine, Ser: 0.96 mg/dL (ref 0.50–1.35)
GFR calc non Af Amer: 81 mL/min — ABNORMAL LOW (ref >90)
Glucose, Bld: 89 mg/dL (ref 70–99)
Total Bilirubin: 1.1 mg/dL (ref 0.3–1.2)

## 2012-09-12 LAB — HEMOGLOBIN A1C
Hgb A1c MFr Bld: 5.1 % (ref <5.7)
Mean Plasma Glucose: 100 mg/dL (ref <117)

## 2012-09-12 LAB — URINALYSIS, ROUTINE W REFLEX MICROSCOPIC
Hgb urine dipstick: NEGATIVE
Specific Gravity, Urine: 1.013 (ref 1.005–1.030)
Urobilinogen, UA: 1 mg/dL (ref 0.0–1.0)

## 2012-09-12 LAB — CBC
HCT: 37 % — ABNORMAL LOW (ref 39.0–52.0)
Hemoglobin: 12.9 g/dL — ABNORMAL LOW (ref 13.0–17.0)
MCV: 81.9 fL (ref 78.0–100.0)
RBC: 4.52 MIL/uL (ref 4.22–5.81)
WBC: 6.4 10*3/uL (ref 4.0–10.5)

## 2012-09-12 LAB — TYPE AND SCREEN: Antibody Screen: NEGATIVE

## 2012-09-12 LAB — PULMONARY FUNCTION TEST

## 2012-09-12 LAB — ABO/RH: ABO/RH(D): O POS

## 2012-09-12 LAB — SURGICAL PCR SCREEN: Staphylococcus aureus: NEGATIVE

## 2012-09-12 MED ORDER — ALBUTEROL SULFATE (5 MG/ML) 0.5% IN NEBU
2.5000 mg | INHALATION_SOLUTION | Freq: Once | RESPIRATORY_TRACT | Status: AC
  Administered 2012-09-12: 2.5 mg via RESPIRATORY_TRACT

## 2012-09-12 NOTE — Progress Notes (Signed)
Pt denies SOB and chest pain. Pt stated that they were told to stop Pradaxa and continue with aspirin as ordered.

## 2012-09-12 NOTE — Progress Notes (Signed)
Pre-op Cardiac Surgery  Carotid Findings:  Bilateral:  No evidence of hemodynamically significant internal carotid artery stenosis.   Vertebral artery flow is antegrade.      Upper Extremity Right Left  Brachial Pressures 152 143  Radial Waveforms Tri Tri  Ulnar Waveforms Tri Tri  Palmar Arch (Allen's Test) Decreases with radial compression, but remains >50%. Normal with ulnar compression. Normal.      Lower  Extremity Right Left  Dorsalis Pedis 145, bi 132, mono  Anterior Tibial    Posterior Tibial 162, tri 155, tri  Ankle/Brachial Indices 1.07 1.02    Gregory [name], RDMS, RVT 09/12/2012

## 2012-09-12 NOTE — Pre-Procedure Instructions (Addendum)
Gregory [name]  09/12/2012   Your procedure is scheduled on: Tuesday, April 29,2014  Report to [facility] at  5:30 AM.  Call this number if you have problems the morning of surgery: [phone]   Remember:   Do not eat food or drink liquids after midnight.   Take these medicines the morning of surgery with A SIP OF WATER:  metoprolol tartrate (LOPRESSOR) 25 MG tablet   Do not take any NSAIDs ie: Ibuprofen, Advil, Naproxen or any medication containing Aspirin.              Do not wear jewelry, make-up or nail polish.  Do not wear lotions, powders, or perfumes. You may wear deodorant.             Men may shave face and neck.  Do not bring valuables to the hospital.  Contacts, dentures or bridgework may not be worn into surgery.  Leave suitcase in the car. After surgery it may be brought to your room.  For patients admitted to the hospital, checkout time is 11:00 AM the day of  discharge.   Patients discharged the day of surgery will not be allowed to drive home.  Name and phone number of your driver:   Special Instructions: Shower using CHG 2 nights before surgery and the night before surgery.  If you shower the day of surgery use CHG.  Use special wash - you have one bottle of CHG for all showers.  You should use approximately 1/3 of the bottle for each shower.   Please read over the following fact sheets that you were given: Pain Booklet, Coughing and Deep Breathing, Blood Transfusion Information and Surgical Site Infection Prevention

## 2012-09-12 NOTE — Progress Notes (Signed)
Pt states that he had a sleep study done in March 2014 at a sleep study center on [address] and had a negative result.

## 2012-09-15 LAB — BLOOD GAS, ARTERIAL
Acid-Base Excess: 1.7 mmol/L (ref 0.0–2.0)
TCO2: 26.5 mmol/L (ref 0–100)
pCO2 arterial: 37.1 mmHg (ref 35.0–45.0)
pO2, Arterial: 91.6 mmHg (ref 80.0–100.0)

## 2012-09-15 MED ORDER — VANCOMYCIN HCL 10 G IV SOLR
1500.0000 mg | INTRAVENOUS | Status: AC
  Administered 2012-09-16: 1500 mg via INTRAVENOUS
  Filled 2012-09-15: qty 1500

## 2012-09-15 MED ORDER — METOPROLOL TARTRATE 12.5 MG HALF TABLET: 12.5000 mg | ORAL_TABLET | Freq: Once | ORAL | Status: DC

## 2012-09-15 MED ORDER — DEXTROSE 5 % IV SOLN
750.0000 mg | INTRAVENOUS | Status: DC
  Filled 2012-09-15: qty 750

## 2012-09-15 MED ORDER — DEXMEDETOMIDINE HCL IN NACL 400 MCG/100ML IV SOLN
0.1000 ug/kg/h | INTRAVENOUS | Status: AC
  Administered 2012-09-16: 0.2 ug/kg/h via INTRAVENOUS
  Filled 2012-09-15: qty 100

## 2012-09-15 MED ORDER — DOPAMINE-DEXTROSE 3.2-5 MG/ML-% IV SOLN
2.0000 ug/kg/min | INTRAVENOUS | Status: DC
Start: 2012-09-16 — End: 2012-09-16
  Filled 2012-09-15: qty 250

## 2012-09-15 MED ORDER — MAGNESIUM SULFATE 50 % IJ SOLN
40.0000 meq | INTRAMUSCULAR | Status: DC
  Filled 2012-09-15: qty 10

## 2012-09-15 MED ORDER — PLASMA-LYTE 148 IV SOLN
INTRAVENOUS | Status: AC
  Administered 2012-09-16: 09:00:00
  Filled 2012-09-15: qty 2.5

## 2012-09-15 MED ORDER — PHENYLEPHRINE HCL 10 MG/ML IJ SOLN
30.0000 ug/min | INTRAVENOUS | Status: DC
  Filled 2012-09-15: qty 2

## 2012-09-15 MED ORDER — NITROGLYCERIN IN D5W 200-5 MCG/ML-% IV SOLN
2.0000 ug/min | INTRAVENOUS | Status: AC
  Administered 2012-09-16: 5 ug/min via INTRAVENOUS
  Filled 2012-09-15: qty 250

## 2012-09-15 MED ORDER — CEFUROXIME SODIUM 1.5 G IJ SOLR
1.5000 g | INTRAMUSCULAR | Status: AC
  Administered 2012-09-16: .75 g via INTRAVENOUS
  Administered 2012-09-16: 1.5 g via INTRAVENOUS
  Filled 2012-09-15 (×2): qty 1.5

## 2012-09-15 MED ORDER — EPINEPHRINE HCL 1 MG/ML IJ SOLN
0.5000 ug/min | INTRAVENOUS | Status: DC
  Filled 2012-09-15: qty 4

## 2012-09-15 MED ORDER — POTASSIUM CHLORIDE 2 MEQ/ML IV SOLN
80.0000 meq | INTRAVENOUS | Status: DC
  Filled 2012-09-15: qty 40

## 2012-09-15 MED ORDER — SODIUM CHLORIDE 0.9 % IV SOLN
INTRAVENOUS | Status: AC
  Administered 2012-09-16: 70 mL/h via INTRAVENOUS
  Filled 2012-09-15: qty 40

## 2012-09-15 MED ORDER — SODIUM CHLORIDE 0.9 % IV SOLN
INTRAVENOUS | Status: AC
  Administered 2012-09-16: 1.6 [IU]/h via INTRAVENOUS
  Filled 2012-09-15: qty 1

## 2012-09-16 ENCOUNTER — Ambulatory Visit (HOSPITAL_COMMUNITY): Payer: Medicare Other | Admitting: Certified Registered Nurse Anesthetist

## 2012-09-16 ENCOUNTER — Inpatient Hospital Stay (HOSPITAL_COMMUNITY): Payer: Medicare Other

## 2012-09-16 ENCOUNTER — Inpatient Hospital Stay (HOSPITAL_COMMUNITY)
Admission: RE | Admit: 2012-09-16 | Discharge: 2012-09-22 | DRG: 236 | Disposition: A | Discharge status: Home / Self Care | Payer: Medicare Other | Source: Ambulatory Visit | Attending: Surgery | Admitting: Surgery

## 2012-09-16 ENCOUNTER — Encounter (HOSPITAL_COMMUNITY)
Admission: RE | Disposition: A | Discharge status: Home / Self Care | Payer: Self-pay | Source: Ambulatory Visit | Attending: Surgery

## 2012-09-16 ENCOUNTER — Encounter (HOSPITAL_COMMUNITY): Payer: Self-pay | Admitting: *Deleted

## 2012-09-16 ENCOUNTER — Encounter (HOSPITAL_COMMUNITY): Payer: Self-pay | Admitting: Certified Registered Nurse Anesthetist

## 2012-09-16 DIAGNOSIS — Z951 Presence of aortocoronary bypass graft: Secondary | ICD-10-CM

## 2012-09-16 DIAGNOSIS — K449 Diaphragmatic hernia without obstruction or gangrene: Secondary | ICD-10-CM | POA: Diagnosis present

## 2012-09-16 DIAGNOSIS — Z9849 Cataract extraction status, unspecified eye: Secondary | ICD-10-CM

## 2012-09-16 DIAGNOSIS — E785 Hyperlipidemia, unspecified: Secondary | ICD-10-CM | POA: Diagnosis present

## 2012-09-16 DIAGNOSIS — I714 Abdominal aortic aneurysm, without rupture, unspecified: Secondary | ICD-10-CM | POA: Diagnosis present

## 2012-09-16 DIAGNOSIS — D62 Acute posthemorrhagic anemia: Secondary | ICD-10-CM | POA: Diagnosis not present

## 2012-09-16 DIAGNOSIS — I059 Rheumatic mitral valve disease, unspecified: Secondary | ICD-10-CM | POA: Diagnosis present

## 2012-09-16 DIAGNOSIS — K219 Gastro-esophageal reflux disease without esophagitis: Secondary | ICD-10-CM | POA: Diagnosis present

## 2012-09-16 DIAGNOSIS — I6529 Occlusion and stenosis of unspecified carotid artery: Secondary | ICD-10-CM | POA: Diagnosis present

## 2012-09-16 DIAGNOSIS — I4891 Unspecified atrial fibrillation: Secondary | ICD-10-CM | POA: Diagnosis present

## 2012-09-16 DIAGNOSIS — Z8249 Family history of ischemic heart disease and other diseases of the circulatory system: Secondary | ICD-10-CM

## 2012-09-16 DIAGNOSIS — I2510 Atherosclerotic heart disease of native coronary artery without angina pectoris: Principal | ICD-10-CM

## 2012-09-16 DIAGNOSIS — E8779 Other fluid overload: Secondary | ICD-10-CM | POA: Diagnosis not present

## 2012-09-16 DIAGNOSIS — K5900 Constipation, unspecified: Secondary | ICD-10-CM | POA: Diagnosis not present

## 2012-09-16 DIAGNOSIS — I658 Occlusion and stenosis of other precerebral arteries: Secondary | ICD-10-CM | POA: Diagnosis present

## 2012-09-16 DIAGNOSIS — Z823 Family history of stroke: Secondary | ICD-10-CM

## 2012-09-16 DIAGNOSIS — I498 Other specified cardiac arrhythmias: Secondary | ICD-10-CM | POA: Diagnosis not present

## 2012-09-16 DIAGNOSIS — Z87891 Personal history of nicotine dependence: Secondary | ICD-10-CM

## 2012-09-16 HISTORY — PX: CORONARY ARTERY BYPASS GRAFT: SHX141

## 2012-09-16 LAB — POCT I-STAT, CHEM 8
Glucose, Bld: 127 mg/dL — ABNORMAL HIGH (ref 70–99)
HCT: 34 % — ABNORMAL LOW (ref 39.0–52.0)
Hemoglobin: 11.6 g/dL — ABNORMAL LOW (ref 13.0–17.0)
Potassium: 4.2 mEq/L (ref 3.5–5.1)
Sodium: 140 mEq/L (ref 135–145)

## 2012-09-16 LAB — CBC
HCT: 33 % — ABNORMAL LOW (ref 39.0–52.0)
Hemoglobin: 11.6 g/dL — ABNORMAL LOW (ref 13.0–17.0)
Hemoglobin: 11.6 g/dL — ABNORMAL LOW (ref 13.0–17.0)
MCH: 29.3 pg (ref 26.0–34.0)
MCHC: 36 g/dL (ref 30.0–36.0)
RBC: 4.01 MIL/uL — ABNORMAL LOW (ref 4.22–5.81)
RDW: 14.5 % (ref 11.5–15.5)
RDW: 14.5 % (ref 11.5–15.5)
WBC: 11.2 10*3/uL — ABNORMAL HIGH (ref 4.0–10.5)

## 2012-09-16 LAB — POCT I-STAT 3, ART BLOOD GAS (G3+)
Acid-Base Excess: 3 mmol/L — ABNORMAL HIGH (ref 0.0–2.0)
Acid-base deficit: 1 mmol/L (ref 0.0–2.0)
Bicarbonate: 25.6 mEq/L — ABNORMAL HIGH (ref 20.0–24.0)
Patient temperature: 36.2
Patient temperature: 36.3
pCO2 arterial: 36.7 mmHg (ref 35.0–45.0)
pCO2 arterial: 41.6 mmHg (ref 35.0–45.0)
pH, Arterial: 7.448 (ref 7.350–7.450)
pH, Arterial: 7.385 (ref 7.350–7.450)
pO2, Arterial: 81 mmHg (ref 80.0–100.0)
pO2, Arterial: 127 mmHg — ABNORMAL HIGH (ref 80.0–100.0)

## 2012-09-16 LAB — GLUCOSE, CAPILLARY
Glucose-Capillary: 102 mg/dL — ABNORMAL HIGH (ref 70–99)
Glucose-Capillary: 92 mg/dL (ref 70–99)
Glucose-Capillary: 104 mg/dL — ABNORMAL HIGH (ref 70–99)
Glucose-Capillary: 121 mg/dL — ABNORMAL HIGH (ref 70–99)

## 2012-09-16 LAB — POCT I-STAT 4, (NA,K, GLUC, HGB,HCT)
Glucose, Bld: 103 mg/dL — ABNORMAL HIGH (ref 70–99)
Glucose, Bld: 106 mg/dL — ABNORMAL HIGH (ref 70–99)
HCT: 30 % — ABNORMAL LOW (ref 39.0–52.0)
Hemoglobin: 10.2 g/dL — ABNORMAL LOW (ref 13.0–17.0)
Hemoglobin: 9.5 g/dL — ABNORMAL LOW (ref 13.0–17.0)
Hemoglobin: 11.2 g/dL — ABNORMAL LOW (ref 13.0–17.0)
Potassium: 4.1 mEq/L (ref 3.5–5.1)
Potassium: 4.7 mEq/L (ref 3.5–5.1)
Potassium: 4.1 mEq/L (ref 3.5–5.1)
Sodium: 141 mEq/L (ref 135–145)
Sodium: 141 mEq/L (ref 135–145)

## 2012-09-16 LAB — CREATININE, SERUM
GFR calc Af Amer: >90 mL/min (ref >90)
GFR calc non Af Amer: 80 mL/min — ABNORMAL LOW (ref >90)

## 2012-09-16 LAB — PROTIME-INR
INR: 1.47 (ref 0.00–1.49)
Prothrombin Time: 17.4 seconds — ABNORMAL HIGH (ref 11.6–15.2)

## 2012-09-16 LAB — HEMOGLOBIN AND HEMATOCRIT, BLOOD: HCT: 25.5 % — ABNORMAL LOW (ref 39.0–52.0)

## 2012-09-16 LAB — MAGNESIUM: Magnesium: 3 mg/dL — ABNORMAL HIGH (ref 1.5–2.5)

## 2012-09-16 SURGERY — CORONARY ARTERY BYPASS GRAFTING (CABG)
Anesthesia: General | Site: Chest | Wound class: Contaminated

## 2012-09-16 MED ORDER — OXYCODONE HCL 5 MG PO TABS
5.0000 mg | ORAL_TABLET | ORAL | Status: DC | PRN
  Administered 2012-09-17: 10 mg via ORAL
  Filled 2012-09-16: qty 2
  Filled 2012-09-16: qty 1

## 2012-09-16 MED ORDER — INSULIN REGULAR BOLUS VIA INFUSION
0.0000 [IU] | Freq: Three times a day (TID) | INTRAVENOUS | Status: DC
  Filled 2012-09-16: qty 10

## 2012-09-16 MED ORDER — SODIUM CHLORIDE 0.9 % IV SOLN
INTRAVENOUS | Status: DC
  Filled 2012-09-16: qty 1

## 2012-09-16 MED ORDER — INSULIN ASPART 100 UNIT/ML ~~LOC~~ SOLN
0.0000 [IU] | SUBCUTANEOUS | Status: DC
  Administered 2012-09-17: 2 [IU] via SUBCUTANEOUS

## 2012-09-16 MED ORDER — PROTAMINE SULFATE 10 MG/ML IV SOLN
INTRAVENOUS | Status: DC | PRN
  Administered 2012-09-16 (×2): 50 mg via INTRAVENOUS
  Administered 2012-09-16: 20 mg via INTRAVENOUS
  Administered 2012-09-16 (×2): 50 mg via INTRAVENOUS
  Administered 2012-09-16: 30 mg via INTRAVENOUS

## 2012-09-16 MED ORDER — GLYCOPYRROLATE 0.2 MG/ML IJ SOLN
INTRAMUSCULAR | Status: DC | PRN
  Administered 2012-09-16: 0.2 mg via INTRAVENOUS
  Administered 2012-09-16: 0.4 mg via INTRAVENOUS

## 2012-09-16 MED ORDER — INSULIN ASPART 100 UNIT/ML ~~LOC~~ SOLN: 0.0000 [IU] | SUBCUTANEOUS | Status: DC

## 2012-09-16 MED ORDER — MORPHINE SULFATE 2 MG/ML IJ SOLN
1.0000 mg | INTRAMUSCULAR | Status: AC | PRN
  Administered 2012-09-16 (×2): 2 mg via INTRAVENOUS

## 2012-09-16 MED ORDER — FENTANYL CITRATE 0.05 MG/ML IJ SOLN
INTRAMUSCULAR | Status: DC | PRN
  Administered 2012-09-16 (×4): 100 ug via INTRAVENOUS
  Administered 2012-09-16: 250 ug via INTRAVENOUS
  Administered 2012-09-16: 100 ug via INTRAVENOUS
  Administered 2012-09-16: 50 ug via INTRAVENOUS
  Administered 2012-09-16: 250 ug via INTRAVENOUS
  Administered 2012-09-16: 700 ug via INTRAVENOUS

## 2012-09-16 MED ORDER — THROMBIN 20000 UNITS EX KIT
PACK | CUTANEOUS | Status: DC | PRN
  Administered 2012-09-16 (×3): via TOPICAL

## 2012-09-16 MED ORDER — ASPIRIN EC 325 MG PO TBEC
325.0000 mg | DELAYED_RELEASE_TABLET | Freq: Every day | ORAL | Status: DC
  Administered 2012-09-17 – 2012-09-19 (×3): 325 mg via ORAL
  Filled 2012-09-16 (×3): qty 1

## 2012-09-16 MED ORDER — MIDAZOLAM HCL 2 MG/2ML IJ SOLN: 2.0000 mg | INTRAMUSCULAR | Status: DC | PRN

## 2012-09-16 MED ORDER — VANCOMYCIN HCL IN DEXTROSE 1-5 GM/200ML-% IV SOLN
1000.0000 mg | Freq: Once | INTRAVENOUS | Status: DC
  Filled 2012-09-16: qty 200

## 2012-09-16 MED ORDER — INSULIN ASPART 100 UNIT/ML ~~LOC~~ SOLN
0.0000 [IU] | SUBCUTANEOUS | Status: AC
  Administered 2012-09-16 (×2): 2 [IU] via SUBCUTANEOUS

## 2012-09-16 MED ORDER — SODIUM CHLORIDE 0.9 % IJ SOLN
3.0000 mL | INTRAMUSCULAR | Status: DC | PRN
  Administered 2012-09-19: 3 mL via INTRAVENOUS

## 2012-09-16 MED ORDER — SODIUM CHLORIDE 0.9 % IV SOLN: INTRAVENOUS | Status: DC

## 2012-09-16 MED ORDER — BISACODYL 5 MG PO TBEC
10.0000 mg | DELAYED_RELEASE_TABLET | Freq: Every day | ORAL | Status: DC
  Administered 2012-09-17 – 2012-09-19 (×3): 10 mg via ORAL
  Filled 2012-09-16 (×3): qty 2

## 2012-09-16 MED ORDER — LACTATED RINGERS IV SOLN: INTRAVENOUS | Status: DC

## 2012-09-16 MED ORDER — SODIUM CHLORIDE 0.9 % IV SOLN: 250.0000 mL | INTRAVENOUS | Status: DC

## 2012-09-16 MED ORDER — LIDOCAINE HCL (CARDIAC) 20 MG/ML IV SOLN
INTRAVENOUS | Status: DC | PRN
  Administered 2012-09-16: 40 mg via INTRAVENOUS

## 2012-09-16 MED ORDER — DOCUSATE SODIUM 100 MG PO CAPS
200.0000 mg | ORAL_CAPSULE | Freq: Every day | ORAL | Status: DC
  Administered 2012-09-17 – 2012-09-19 (×3): 200 mg via ORAL
  Filled 2012-09-16 (×3): qty 2

## 2012-09-16 MED ORDER — MORPHINE SULFATE 2 MG/ML IJ SOLN
2.0000 mg | INTRAMUSCULAR | Status: DC | PRN
  Administered 2012-09-16 – 2012-09-17 (×2): 2 mg via INTRAVENOUS
  Filled 2012-09-16 (×4): qty 1

## 2012-09-16 MED ORDER — DEXTROSE 5 % IV SOLN
1.5000 g | Freq: Two times a day (BID) | INTRAVENOUS | Status: AC
  Administered 2012-09-16 – 2012-09-18 (×4): 1.5 g via INTRAVENOUS
  Filled 2012-09-16 (×4): qty 1.5

## 2012-09-16 MED ORDER — POTASSIUM CHLORIDE 10 MEQ/50ML IV SOLN: 10.0000 meq | INTRAVENOUS | Status: AC

## 2012-09-16 MED ORDER — ACETAMINOPHEN 160 MG/5ML PO SOLN
975.0000 mg | Freq: Four times a day (QID) | ORAL | Status: DC
  Filled 2012-09-16: qty 40.6

## 2012-09-16 MED ORDER — HEPARIN SODIUM (PORCINE) 1000 UNIT/ML IJ SOLN
INTRAMUSCULAR | Status: DC | PRN
  Administered 2012-09-16: 28000 [IU] via INTRAVENOUS

## 2012-09-16 MED ORDER — PROPOFOL 10 MG/ML IV BOLUS
INTRAVENOUS | Status: DC | PRN
  Administered 2012-09-16: 60 mg via INTRAVENOUS

## 2012-09-16 MED ORDER — LACTATED RINGERS IV SOLN
INTRAVENOUS | Status: DC | PRN
  Administered 2012-09-16 (×2): via INTRAVENOUS

## 2012-09-16 MED ORDER — ROCURONIUM BROMIDE 100 MG/10ML IV SOLN
INTRAVENOUS | Status: DC | PRN
  Administered 2012-09-16 (×4): 50 mg via INTRAVENOUS

## 2012-09-16 MED ORDER — METOPROLOL TARTRATE 25 MG/10 ML ORAL SUSPENSION
12.5000 mg | Freq: Two times a day (BID) | ORAL | Status: DC
  Filled 2012-09-16 (×3): qty 5

## 2012-09-16 MED ORDER — VECURONIUM BROMIDE 10 MG IV SOLR
INTRAVENOUS | Status: DC | PRN
  Administered 2012-09-16: 10 mg via INTRAVENOUS

## 2012-09-16 MED ORDER — ONDANSETRON HCL 4 MG/2ML IJ SOLN
4.0000 mg | Freq: Four times a day (QID) | INTRAMUSCULAR | Status: DC | PRN
  Administered 2012-09-17: 4 mg via INTRAVENOUS
  Filled 2012-09-16: qty 2

## 2012-09-16 MED ORDER — METOPROLOL TARTRATE 25 MG/10 ML ORAL SUSPENSION
12.5000 mg | Freq: Two times a day (BID) | ORAL | Status: DC
  Filled 2012-09-16: qty 5

## 2012-09-16 MED ORDER — DEXMEDETOMIDINE HCL IN NACL 200 MCG/50ML IV SOLN: 0.1000 ug/kg/h | INTRAVENOUS | Status: DC

## 2012-09-16 MED ORDER — PHENYLEPHRINE HCL 10 MG/ML IJ SOLN
10.0000 mg | INTRAVENOUS | Status: DC | PRN
  Administered 2012-09-16: 10 ug/min via INTRAVENOUS

## 2012-09-16 MED ORDER — ATORVASTATIN CALCIUM 80 MG PO TABS
80.0000 mg | ORAL_TABLET | Freq: Every day | ORAL | Status: DC
  Administered 2012-09-17 – 2012-09-22 (×6): 80 mg via ORAL
  Filled 2012-09-16 (×6): qty 1

## 2012-09-16 MED ORDER — PHENYLEPHRINE HCL 10 MG/ML IJ SOLN
INTRAMUSCULAR | Status: DC | PRN
  Administered 2012-09-16: 40 ug via INTRAVENOUS
  Administered 2012-09-16: 20 ug via INTRAVENOUS
  Administered 2012-09-16: 40 ug via INTRAVENOUS
  Administered 2012-09-16: 20 ug via INTRAVENOUS
  Administered 2012-09-16: 40 ug via INTRAVENOUS

## 2012-09-16 MED ORDER — ALBUMIN HUMAN 5 % IV SOLN
250.0000 mL | INTRAVENOUS | Status: AC | PRN
  Administered 2012-09-16 – 2012-09-17 (×4): 250 mL via INTRAVENOUS
  Filled 2012-09-16: qty 250

## 2012-09-16 MED ORDER — PHENYLEPHRINE HCL 10 MG/ML IJ SOLN
0.0000 ug/min | INTRAVENOUS | Status: DC
  Administered 2012-09-17: 25 ug/min via INTRAVENOUS
  Filled 2012-09-16 (×3): qty 2

## 2012-09-16 MED ORDER — FAMOTIDINE IN NACL 20-0.9 MG/50ML-% IV SOLN
20.0000 mg | Freq: Two times a day (BID) | INTRAVENOUS | Status: AC
  Administered 2012-09-16: 20 mg via INTRAVENOUS

## 2012-09-16 MED ORDER — INSULIN ASPART 100 UNIT/ML ~~LOC~~ SOLN
0.0000 [IU] | SUBCUTANEOUS | Status: DC
  Administered 2012-09-16: 2 [IU] via SUBCUTANEOUS

## 2012-09-16 MED ORDER — BISACODYL 10 MG RE SUPP: 10.0000 mg | Freq: Every day | RECTAL | Status: DC

## 2012-09-16 MED ORDER — SODIUM CHLORIDE 0.9 % IJ SOLN
3.0000 mL | Freq: Two times a day (BID) | INTRAMUSCULAR | Status: DC
  Administered 2012-09-17 – 2012-09-19 (×4): 3 mL via INTRAVENOUS

## 2012-09-16 MED ORDER — 0.9 % SODIUM CHLORIDE (POUR BTL) OPTIME
TOPICAL | Status: DC | PRN
  Administered 2012-09-16: 6000 mL

## 2012-09-16 MED ORDER — VANCOMYCIN HCL IN DEXTROSE 1-5 GM/200ML-% IV SOLN
1000.0000 mg | Freq: Once | INTRAVENOUS | Status: AC
  Administered 2012-09-16: 1000 mg via INTRAVENOUS
  Filled 2012-09-16: qty 200

## 2012-09-16 MED ORDER — ALBUMIN HUMAN 5 % IV SOLN
INTRAVENOUS | Status: DC | PRN
  Administered 2012-09-16: 11:00:00 via INTRAVENOUS

## 2012-09-16 MED ORDER — METOPROLOL TARTRATE 12.5 MG HALF TABLET
12.5000 mg | ORAL_TABLET | Freq: Two times a day (BID) | ORAL | Status: DC
  Filled 2012-09-16 (×3): qty 1

## 2012-09-16 MED ORDER — HEMOSTATIC AGENTS (NO CHARGE) OPTIME
TOPICAL | Status: DC | PRN
  Administered 2012-09-16: 1 via TOPICAL

## 2012-09-16 MED ORDER — PANTOPRAZOLE SODIUM 40 MG PO TBEC
40.0000 mg | DELAYED_RELEASE_TABLET | Freq: Every day | ORAL | Status: DC
  Administered 2012-09-18 – 2012-09-19 (×2): 40 mg via ORAL
  Filled 2012-09-16 (×2): qty 1

## 2012-09-16 MED ORDER — DEXTROSE 5 % IV SOLN
1.5000 g | Freq: Two times a day (BID) | INTRAVENOUS | Status: DC
  Filled 2012-09-16 (×2): qty 1.5

## 2012-09-16 MED ORDER — NITROGLYCERIN IN D5W 200-5 MCG/ML-% IV SOLN: 0.0000 ug/min | INTRAVENOUS | Status: DC

## 2012-09-16 MED ORDER — MIDAZOLAM HCL 5 MG/5ML IJ SOLN
INTRAMUSCULAR | Status: DC | PRN
  Administered 2012-09-16 (×8): 2 mg via INTRAVENOUS

## 2012-09-16 MED ORDER — ACETAMINOPHEN 10 MG/ML IV SOLN
1000.0000 mg | Freq: Once | INTRAVENOUS | Status: AC
  Administered 2012-09-16: 1000 mg via INTRAVENOUS
  Filled 2012-09-16: qty 100

## 2012-09-16 MED ORDER — METOPROLOL TARTRATE 12.5 MG HALF TABLET
12.5000 mg | ORAL_TABLET | Freq: Two times a day (BID) | ORAL | Status: DC
  Filled 2012-09-16: qty 1

## 2012-09-16 MED ORDER — SODIUM CHLORIDE 0.45 % IV SOLN
INTRAVENOUS | Status: DC
  Administered 2012-09-16: 20 mL/h via INTRAVENOUS

## 2012-09-16 MED ORDER — THROMBIN 20000 UNITS EX SOLR
CUTANEOUS | Status: AC
  Filled 2012-09-16: qty 20000

## 2012-09-16 MED ORDER — ACETAMINOPHEN 500 MG PO TABS
1000.0000 mg | ORAL_TABLET | Freq: Four times a day (QID) | ORAL | Status: DC
  Administered 2012-09-17 – 2012-09-19 (×10): 1000 mg via ORAL
  Filled 2012-09-16 (×14): qty 2

## 2012-09-16 MED ORDER — MAGNESIUM SULFATE 40 MG/ML IJ SOLN
4.0000 g | Freq: Once | INTRAMUSCULAR | Status: AC
  Administered 2012-09-16: 4 g via INTRAVENOUS
  Filled 2012-09-16: qty 100

## 2012-09-16 MED ORDER — METOPROLOL TARTRATE 1 MG/ML IV SOLN: 2.5000 mg | INTRAVENOUS | Status: DC | PRN

## 2012-09-16 MED ORDER — ASPIRIN 81 MG PO CHEW: 324.0000 mg | CHEWABLE_TABLET | Freq: Every day | ORAL | Status: DC

## 2012-09-16 MED ORDER — LACTATED RINGERS IV SOLN: 500.0000 mL | Freq: Once | INTRAVENOUS | Status: AC | PRN

## 2012-09-16 MED ORDER — CHLORHEXIDINE GLUCONATE 4 % EX LIQD: 30.0000 mL | CUTANEOUS | Status: DC

## 2012-09-16 SURGICAL SUPPLY — 99 items
ATTRACTOMAT 16X20 MAGNETIC DRP (DRAPES) ×2 IMPLANT
BAG DECANTER FOR FLEXI CONT (MISCELLANEOUS) ×2 IMPLANT
BANDAGE ELASTIC 4 VELCRO ST LF (GAUZE/BANDAGES/DRESSINGS) IMPLANT
BANDAGE ELASTIC 6 VELCRO ST LF (GAUZE/BANDAGES/DRESSINGS) IMPLANT
BANDAGE GAUZE ELAST BULKY 4 IN (GAUZE/BANDAGES/DRESSINGS) ×4 IMPLANT
BASKET HEART (ORDER IN 25'S) (MISCELLANEOUS) ×1
BASKET HEART (ORDER IN 25S) (MISCELLANEOUS) ×1 IMPLANT
BLADE STERNUM SYSTEM 6 (BLADE) ×2 IMPLANT
CANISTER SUCTION 2500CC (MISCELLANEOUS) ×2 IMPLANT
CANNULA VENOUS DUAL 34/46 (CANNULA) ×2 IMPLANT
CATH ROBINSON RED A/P 18FR (CATHETERS) ×4 IMPLANT
CATH THORACIC 28FR (CATHETERS) ×2 IMPLANT
CATH THORACIC 28FR RT ANG (CATHETERS) IMPLANT
CATH THORACIC 36FR (CATHETERS) ×2 IMPLANT
CATH THORACIC 36FR RT ANG (CATHETERS) ×2 IMPLANT
CLIP TI MEDIUM 24 (CLIP) IMPLANT
CLIP TI WIDE RED SMALL 24 (CLIP) IMPLANT
CLOTH BEACON ORANGE TIMEOUT ST (SAFETY) ×2 IMPLANT
COVER SURGICAL LIGHT HANDLE (MISCELLANEOUS) ×2 IMPLANT
CRADLE DONUT ADULT HEAD (MISCELLANEOUS) ×2 IMPLANT
DRAPE CARDIOVASCULAR INCISE (DRAPES) ×1
DRAPE SLUSH MACHINE 52X66 (DRAPES) IMPLANT
DRAPE SLUSH/WARMER DISC (DRAPES) ×2 IMPLANT
DRAPE SRG 135X102X78XABS (DRAPES) ×1 IMPLANT
DRSG COVADERM 4X14 (GAUZE/BANDAGES/DRESSINGS) ×2 IMPLANT
ELECT CAUTERY BLADE 6.4 (BLADE) ×2 IMPLANT
ELECT REM PT RETURN 9FT ADLT (ELECTROSURGICAL) ×4
ELECTRODE REM PT RTRN 9FT ADLT (ELECTROSURGICAL) ×2 IMPLANT
GLOVE BIO SURGEON STRL SZ 6 (GLOVE) ×4 IMPLANT
GLOVE BIO SURGEON STRL SZ 6.5 (GLOVE) IMPLANT
GLOVE BIO SURGEON STRL SZ7 (GLOVE) IMPLANT
GLOVE BIO SURGEON STRL SZ7.5 (GLOVE) ×6 IMPLANT
GLOVE BIOGEL PI IND STRL 6 (GLOVE) ×5 IMPLANT
GLOVE BIOGEL PI IND STRL 6.5 (GLOVE) IMPLANT
GLOVE BIOGEL PI IND STRL 7.0 (GLOVE) IMPLANT
GLOVE BIOGEL PI INDICATOR 6 (GLOVE) ×5
GLOVE BIOGEL PI INDICATOR 6.5 (GLOVE)
GLOVE BIOGEL PI INDICATOR 7.0 (GLOVE)
GLOVE EUDERMIC 7 POWDERFREE (GLOVE) ×4 IMPLANT
GLOVE ORTHO TXT STRL SZ7.5 (GLOVE) IMPLANT
GOWN PREVENTION PLUS XLARGE (GOWN DISPOSABLE) ×2 IMPLANT
GOWN STRL NON-REIN LRG LVL3 (GOWN DISPOSABLE) ×10 IMPLANT
HEMOSTAT POWDER SURGIFOAM 1G (HEMOSTASIS) ×6 IMPLANT
HEMOSTAT SURGICEL 2X14 (HEMOSTASIS) ×2 IMPLANT
INSERT FOGARTY 61MM (MISCELLANEOUS) IMPLANT
INSERT FOGARTY XLG (MISCELLANEOUS) IMPLANT
KIT BASIN OR (CUSTOM PROCEDURE TRAY) ×2 IMPLANT
KIT CATH CPB BARTLE (MISCELLANEOUS) ×2 IMPLANT
KIT ROOM TURNOVER OR (KITS) ×2 IMPLANT
KIT SUCTION CATH 14FR (SUCTIONS) ×2 IMPLANT
KIT VASOVIEW W/TROCAR VH 2000 (KITS) IMPLANT
NS IRRIG 1000ML POUR BTL (IV SOLUTION) ×12 IMPLANT
PACK OPEN HEART (CUSTOM PROCEDURE TRAY) ×2 IMPLANT
PAD ARMBOARD 7.5X6 YLW CONV (MISCELLANEOUS) ×4 IMPLANT
PENCIL BUTTON HOLSTER BLD 10FT (ELECTRODE) ×2 IMPLANT
PUNCH AORTIC ROTATE 4.0MM (MISCELLANEOUS) IMPLANT
PUNCH AORTIC ROTATE 4.5MM 8IN (MISCELLANEOUS) ×2 IMPLANT
PUNCH AORTIC ROTATE 5MM 8IN (MISCELLANEOUS) IMPLANT
SET CARDIOPLEGIA MPS 5001102 (MISCELLANEOUS) ×2 IMPLANT
SPONGE GAUZE 4X4 12PLY (GAUZE/BANDAGES/DRESSINGS) ×2 IMPLANT
SPONGE INTESTINAL PEANUT (DISPOSABLE) IMPLANT
SPONGE LAP 18X18 X RAY DECT (DISPOSABLE) IMPLANT
SPONGE LAP 4X18 X RAY DECT (DISPOSABLE) ×2 IMPLANT
SUT BONE WAX W31G (SUTURE) ×2 IMPLANT
SUT MNCRL AB 4-0 PS2 18 (SUTURE) IMPLANT
SUT PROLENE 3 0 SH DA (SUTURE) IMPLANT
SUT PROLENE 3 0 SH1 36 (SUTURE) ×2 IMPLANT
SUT PROLENE 4 0 RB 1 (SUTURE)
SUT PROLENE 4 0 SH DA (SUTURE) IMPLANT
SUT PROLENE 4-0 RB1 .5 CRCL 36 (SUTURE) IMPLANT
SUT PROLENE 5 0 C 1 36 (SUTURE) IMPLANT
SUT PROLENE 6 0 C 1 30 (SUTURE) IMPLANT
SUT PROLENE 7 0 BV 1 (SUTURE) IMPLANT
SUT PROLENE 7 0 BV1 MDA (SUTURE) ×2 IMPLANT
SUT PROLENE 8 0 BV175 6 (SUTURE) ×6 IMPLANT
SUT SILK  1 MH (SUTURE)
SUT SILK 1 MH (SUTURE) IMPLANT
SUT STEEL STERNAL CCS#1 18IN (SUTURE) IMPLANT
SUT STEEL SZ 6 DBL 3X14 BALL (SUTURE) ×6 IMPLANT
SUT VIC AB 1 CTX 36 (SUTURE) ×3
SUT VIC AB 1 CTX36XBRD ANBCTR (SUTURE) ×3 IMPLANT
SUT VIC AB 2-0 CT1 27 (SUTURE)
SUT VIC AB 2-0 CT1 TAPERPNT 27 (SUTURE) IMPLANT
SUT VIC AB 2-0 CTX 27 (SUTURE) IMPLANT
SUT VIC AB 3-0 SH 27 (SUTURE)
SUT VIC AB 3-0 SH 27X BRD (SUTURE) IMPLANT
SUT VIC AB 3-0 X1 27 (SUTURE) IMPLANT
SUT VICRYL 4-0 PS2 18IN ABS (SUTURE) IMPLANT
SUTURE E-PAK OPEN HEART (SUTURE) ×2 IMPLANT
SYSTEM SAHARA CHEST DRAIN ATS (WOUND CARE) ×2 IMPLANT
TAPE CLOTH SURG 4X10 WHT LF (GAUZE/BANDAGES/DRESSINGS) ×2 IMPLANT
TAPE PAPER 3X10 WHT MICROPORE (GAUZE/BANDAGES/DRESSINGS) ×2 IMPLANT
TOWEL OR 17X24 6PK STRL BLUE (TOWEL DISPOSABLE) ×2 IMPLANT
TOWEL OR 17X26 10 PK STRL BLUE (TOWEL DISPOSABLE) ×2 IMPLANT
TRAY FOLEY IC TEMP SENS 14FR (CATHETERS) ×2 IMPLANT
TUBE SUCT INTRACARD DLP 20F (MISCELLANEOUS) ×2 IMPLANT
TUBING INSUFFLATION 10FT LAP (TUBING) ×2 IMPLANT
UNDERPAD 30X30 INCONTINENT (UNDERPADS AND DIAPERS) ×2 IMPLANT
WATER STERILE IRR 1000ML POUR (IV SOLUTION) ×4 IMPLANT

## 2012-09-16 NOTE — Plan of Care (Signed)
Problem: Phase I Progression Outcomes Goal: Point person for discharge identified Outcome: Completed/Met Date Met:  09/16/12 Home with wife.

## 2012-09-16 NOTE — OR Nursing (Signed)
Case changed from clean case to contaminated due to hole in warming drape.  Dr. Laneta Simmers aware.

## 2012-09-16 NOTE — Interval H&P Note (Signed)
History and Physical Interval Note:  09/16/2012 7:47 AM  Gregory [name]  has presented today for surgery, with the diagnosis of Coronary Artery Disease  The various methods of treatment have been discussed with the patient and family. After consideration of risks, benefits and other options for treatment, the patient has consented to  Procedure(s): CORONARY ARTERY BYPASS GRAFTING (CABG) (N/A) as a surgical intervention .  The patient's history has been reviewed, patient examined, no change in status, stable for surgery.  I have reviewed the patient's chart and labs.  Questions were answered to the patient's satisfaction.     [name]

## 2012-09-16 NOTE — Progress Notes (Signed)
Patient ID: Gregory [name], male   DOB: 1941-04-19, 72 y.o.   MRN: [id number]                   [address]            [place] [address]          [phone]     Day of Surgery Procedure(s) (LRB): CORONARY ARTERY BYPASS GRAFTING (CABG) TIMES ONE USING LEFT INTERNAL MAMMARY ARTERY (N/A)  Total Length of Stay:  LOS: 0 days  BP 110/55  Pulse 80  Temp(Src) 98.2 F (36.8 C) (Oral)  Resp 18  Wt 210 lb (95.255 kg)  BMI 29.7 kg/m2  SpO2 100%  .Intake/Output     04/29 0701 - 04/30 0700   I.V. (mL/kg) 1700 (17.8)   Blood 904   IV Piggyback 500   Total Intake(mL/kg) 3104 (32.6)   Urine (mL/kg/hr) 1610 (1.3)   Blood 1200 (1)   Chest Tube 455 (0.4)   Total Output 3265   Net -161         . sodium chloride 20 mL/hr at 09/16/12 1900  . sodium chloride 10 mL/hr at 09/16/12 1300  . [START ON 09/17/2012] sodium chloride    . dexmedetomidine Stopped (09/16/12 1530)  . insulin (NOVOLIN-R) infusion 0.4 Units/hr (09/16/12 1530)  . lactated ringers 20 mL/hr at 09/16/12 1900  . nitroGLYCERIN Stopped (09/16/12 1340)  . phenylephrine (NEO-SYNEPHRINE) Adult infusion 30 mcg/min (09/16/12 1900)     Lab Results  Component Value Date   WBC 11.2* 09/16/2012   HGB 11.6* 09/16/2012   HCT 34.0* 09/16/2012   PLT 173 09/16/2012   GLUCOSE 127* 09/16/2012   ALT 12 09/12/2012   AST 18 09/12/2012   NA 140 09/16/2012   K 4.2 09/16/2012   CL 104 09/16/2012   CREATININE 1.00 09/16/2012   BUN 15 09/16/2012   CO2 23 09/12/2012   INR 1.47 09/16/2012   HGBA1C 5.1 09/12/2012   Now off vent still sleepy Not bleeding  Delight Ovens MD  Beeper [phone] Office [phone] 09/16/2012 7:40 PM

## 2012-09-16 NOTE — OR Nursing (Signed)
1046 first call to the unit and to volunteer desk to let family know that patient was off bypass. 1120 second call to sicu.

## 2012-09-16 NOTE — Anesthesia Postprocedure Evaluation (Signed)
  Anesthesia Post-op Note  Patient: Gregory [name]  Procedure(s) Performed: Procedure(s): CORONARY ARTERY BYPASS GRAFTING (CABG) TIMES ONE USING LEFT INTERNAL MAMMARY ARTERY (N/A)  Patient Location: SICU  Anesthesia Type:General  Level of Consciousness: sedated and Patient remains intubated per anesthesia plan  Airway and Oxygen Therapy: Patient remains intubated per anesthesia plan and Patient placed on Ventilator (see vital sign flow sheet for setting)  Post-op Pain: none  Post-op Assessment: Post-op Vital signs reviewed, Patient's Cardiovascular Status Stable, Respiratory Function Stable, Patent Airway, No signs of Nausea or vomiting and Pain level controlled  Post-op Vital Signs: stable  Complications: No apparent anesthesia complications

## 2012-09-16 NOTE — Anesthesia Preprocedure Evaluation (Signed)
Anesthesia Evaluation  Patient identified by MRN, date of birth, ID band Patient awake    Reviewed: Allergy & Precautions, H&P , NPO status , Patient's Chart, lab work & pertinent test results  Airway Mallampati: I TM Distance: >3 FB Neck ROM: full    Dental   Pulmonary          Cardiovascular + CAD and + Peripheral Vascular Disease + dysrhythmias Atrial Fibrillation Rhythm:irregular Rate:Normal     Neuro/Psych    GI/Hepatic GERD-  ,  Endo/Other    Renal/GU      Musculoskeletal   Abdominal   Peds  Hematology  (+) Blood dyscrasia, ,   Anesthesia Other Findings   Reproductive/Obstetrics                           Anesthesia Physical Anesthesia Plan  ASA: III  Anesthesia Plan: General   Post-op Pain Management:    Induction: Intravenous  Airway Management Planned: Oral ETT  Additional Equipment: Arterial line, CVP and PA Cath  Intra-op Plan:   Post-operative Plan: Extubation in OR and Post-operative intubation/ventilation  Informed Consent: I have reviewed the patients History and Physical, chart, labs and discussed the procedure including the risks, benefits and alternatives for the proposed anesthesia with the patient or authorized representative who has indicated his/her understanding and acceptance.     Plan Discussed with: CRNA, Anesthesiologist and Surgeon  Anesthesia Plan Comments:         Anesthesia Quick Evaluation

## 2012-09-16 NOTE — Brief Op Note (Signed)
09/16/2012  10:34 AM  PATIENT:  [name]  72 y.o. male  PRE-OPERATIVE DIAGNOSIS:  Coronary Artery Disease  POST-OPERATIVE DIAGNOSIS:  Coronary Artery Disease  PROCEDURE:  Procedure(s): CORONARY ARTERY BYPASS GRAFTING (CABG) X1 LIMA -OM 1  SURGEON:  Surgeon(s): [name], MD  PHYSICIAN ASSISTANT: Rett Stehlik PA-C  ANESTHESIA:   general  PATIENT CONDITION:  ICU - intubated and hemodynamically stable.  PRE-OPERATIVE WEIGHT: 95.2kg  COMPLICATIONS: NO KNOWN

## 2012-09-16 NOTE — H&P (Signed)
  [address]       [place] [address]             [phone]      PCP is KOHUT,WALTER DENNIS, MD Referring Provider is Ganji, Jagadeesh R, MD  Chief Complaint  Patient presents with  . Coronary Artery Disease    Surgical eval for CAD, cardiac cath 08/19/12     HPI:  The patient is a 72-year-old gentleman with a history of hyperlipidemia who was recently found to be in atrial fibrillation with a rapid ventricular response during a routine physical examination. The patient was referred to Dr. Ganji for further evaluation and the patient and his wife related at least a six-month history of exertional fatigue and a pressure sensation in his chest that he calls a "grab". This occurs with a mild level of exertion and resolves with rest. He has also noticed symptoms after eating. He thought that this was likely related to reflux since he has had previous esophageal dilatation for esophageal stricture. A 2-D echocardiogram from 08/13/2012 showed a normal left ventricular ejection fraction with moderate left ventricular hypertrophy and mild mitral regurgitation. He also underwent a Lexiscan stress test on 08/08/2012 which showed left ventricular dilatation with stress and a large inferior and lateral area of severe ischemia. He was started on Pradaxa for atrial fibrillation. He subsequently underwent cardiac catheterization on 08/19/2012. This showed a dominant left circumflex coronary artery that is occluded just after the ostium and filled by collaterals from the septal perforators of the LAD. There is a small ramus that was subtotally occluded and then a moderate-sized marginal branch with 30-40% proximal stenosis. The remainder of the left circumflex was widely patent and filled slowly. The right coronary was a small, nondominant vessel that was diffusely diseased. The LAD was a large vessel with mild luminal irregularities. There is a large first diagonal branch that had no  significant stenosis. Left ventricular ejection fraction is about 60% with no mitral regurgitation.   Past Medical History  Diagnosis Date  . Esophageal stricture   . Deformed pylorus, acquired   . Erythema of esophagus   . Hiatal hernia   . AAA (abdominal aortic aneurysm)   . Fatty infiltration of liver   . Splenomegaly   . AF (atrial fibrillation) 2014  . S/P cardiac cath 08/12/12  . Abnormal stress test 08/08/12  . Hyperlipidemia   . Bilateral carotid artery stenosis 08/14/12    moderate bilaterally per carotid duplex  . CAD (coronary artery disease)   . GERD (gastroesophageal reflux disease)     Past Surgical History  Procedure Laterality Date  . Inner ear surgery  1995    Dr. Kauffman...placement of shunt  . Eye surgery  2012    left cataract extraction  . Colonoscopy  2004    Family History  Problem Relation Age of Onset  . Heart disease Father     died from  . Kidney disease Mother   . Stroke Brother     Social History History  Substance Use Topics  . Smoking status: Former Smoker    Quit date: 08/29/1987  . Smokeless tobacco: Never Used  . Alcohol Use: No    Current Outpatient Prescriptions  Medication Sig Dispense Refill  . aspirin 81 MG tablet Take 81 mg by mouth daily.      . atorvastatin (LIPITOR) 80 MG tablet Take 80 mg by mouth daily.      . calcium carbonate (TUMS - DOSED   IN MG ELEMENTAL CALCIUM) 500 MG chewable tablet Chew 2 tablets by mouth 3 (three) times daily as needed for heartburn.      . dabigatran (PRADAXA) 150 MG CAPS Take 150 mg by mouth every 12 (twelve) hours.      . metoprolol tartrate (LOPRESSOR) 25 MG tablet Take 25 mg by mouth 2 (two) times daily.       No current facility-administered medications for this visit.    No Known Allergies  Review of Systems  Constitutional: Positive for activity change and fatigue. Negative for appetite change.  HENT: Positive for nosebleeds.        Mild since starting Pradaxa   Eyes: Negative.    Respiratory: Positive for chest tightness. Negative for shortness of breath.   Cardiovascular: Positive for chest pain and leg swelling. Negative for palpitations.  Gastrointestinal: Negative.   Endocrine: Negative.   Genitourinary: Negative.   Allergic/Immunologic: Negative.   Neurological: Negative.   Hematological: Negative.   Psychiatric/Behavioral: Negative.     BP 104/61  Pulse 64  Resp 20  Ht 5' 10.5" (1.791 m)  Wt 270 lb (122.471 kg)  BMI 38.18 kg/m2  SpO2 97% Physical Exam  Constitutional: He is oriented to person, place, and time. He appears well-developed and well-nourished. No distress.  HENT:  Head: Normocephalic and atraumatic.  Mouth/Throat: Oropharynx is clear and moist.  Eyes: Conjunctivae and EOM are normal. Pupils are equal, round, and reactive to light.  Neck: Normal range of motion. Neck supple. No JVD present. No thyromegaly present.  Cardiovascular: Normal rate, regular rhythm and intact distal pulses.  Exam reveals no gallop and no friction rub.   No murmur heard. Pulmonary/Chest: Effort normal and breath sounds normal. No respiratory distress. He has no wheezes. He has no rales.  Abdominal: Soft. Bowel sounds are normal. He exhibits no distension and no mass. There is no tenderness.  Musculoskeletal: Normal range of motion. He exhibits edema.  Mild in ankles bilaterally.  Lymphadenopathy:    He has no cervical adenopathy.  Neurological: He is alert and oriented to person, place, and time. He has normal strength. No sensory deficit.  Skin: Skin is warm and dry.  Psychiatric: He has a normal mood and affect.     Diagnostic Tests:  Cardiac Cath:  Hemodynamic data:  Left ventricular pressure was 122/9 with LVEDP of 14 mm mercury. Aortic pressure was 118/50 with a mean of 72 mm mercury. There was no pressure gradient across the aortic valve  Left ventricle: Performed in the RAO projection revealed LVEF of 60%. There was No significant MR. No wall  motion abnormality.  Right coronary artery: The vessel is small, with mild diffuse disease, nondominant.  Left main coronary artery is large mildly calcified with at most eccentric 20% stenosis.  Circumflex coronary artery: It is a very large caliber vessel and a dominant vessel. It supplied territory. It is occluded in the proximal segment. The occlusion is calcific occlusion. The distal segment of the circumflex coronary artery is collateralized by the septal perforators from the LAD. The circumflex coronary artery appears to do origin to a very high OM 1 which in some views appeared to be a very small ramus intermediate which has ostial severe disease and is subtotally occluded in its very small. OM 2 is moderate caliber vessel with a proximal 30-40% stenosis. Rest of the circumflex coronary vessel looks widely patent.  LAD: LAD gives origin to a large diagonal-1. LAD has mild luminal irregularities. There is proximal tortuosity   evident in the LAD and forms a semi-loop at the origin. It is calcified mild to moderately.     Impression:  He has an occluded dominant left circumflex coronary filling by collaterals with significant exertional fatigue and chest discomfort. His nuclear stress test shows a large area of lateral and inferior ischemia corresponding to this distribution. He is in overall good health and has remained quite active until recently when he has had to slow down due to the chest discomfort and fatigue. I think percutaneous intervention on the ostial and proximal left circumflex would be fairly risky due to the possibility of problems with the left main coronary artery and the need to stent back into the left main. I think his risk for coronary bypass graft surgery would be very low and would give him a good long-term prognosis and a full functional recovery. I discussed the operative procedure with the patient and family including alternatives, benefits and risks; including but not  limited to bleeding, blood transfusion, infection, stroke, myocardial infarction, graft failure, heart block requiring a permanent pacemaker, organ dysfunction, and death. I told them that he may or may not continue to have atrial fibrillation postop and may need to stay on anticoagulants.  Gregory [name] understands and agrees to proceed.  We will schedule surgery for Tuesday 09/16/12.  Plan:  We will plan surgery for Tuesday 09/16/12. He will discontinue the Pradaxa now and continue aspirin at 81 mg per day.  

## 2012-09-16 NOTE — Care Management Note (Signed)
    Page 1 of 1   09/18/2012     3:23:39 PM   CARE MANAGEMENT NOTE 09/18/2012  Patient:  Gregory [name], Gregory [name]   Account Number:  [id number]  Date Initiated:  09/16/2012  Documentation initiated by:  Avie Arenas  Subjective/Objective Assessment:   post op CABG x1  has spouse.     Action/Plan:   Anticipated DC Date:  09/22/2012   Anticipated DC Plan:  HOME W HOME HEALTH SERVICES      DC Planning Services  CM consult      Choice offered to / List presented to:             Status of service:  In process, will continue to follow Medicare Important Message given?   (If response is "NO", the following Medicare IM given date fields will be blank) Date Medicare IM given:   Date Additional Medicare IM given:    Discharge Disposition:    Per UR Regulation:  Reviewed for med. necessity/level of care/duration of stay  If discussed at Long Length of Stay Meetings, dates discussed:    Comments:  Contact:  Fehl,Janet Spouse [phone]   [phone]  09-18-12 3:20pm Avie Arenas, RNBSN - [phone] Family have friends that they have talked with about discharge planning and would like [facility] RN and PT with [facility].  Now do not want a hospital bed or lift chair.  Will need orders for [facility] RN and PT on discharge.  When referred would like to have Daralene Milch come out to see patient at PT.  09-17-12 3pm Avie Arenas, RNBSN [phone] Patient sitting up in chair - Wife, sister and son in room. Confirmed patient lived at home with wife prior to admission. Plan to go back home on discharge with wife/family with him 24/7.  Asking about life chair, bed, wedges on discharge.  Advised to check on cost and choices with Medical Supply companies or on the internet.  Talked with DME representative Justin from [facility].  Informed that if ordered through epic as DME - hospital bed - it would autimatically  pull screens up for justification of bed that would have to be documented by physisican.  Choices that  would need documentation would be post op CABG and why head of bed would need to bed elevated.

## 2012-09-16 NOTE — Transfer of Care (Signed)
Immediate Anesthesia Transfer of Care Note  Patient: Gregory [name]  Procedure(s) Performed: Procedure(s): CORONARY ARTERY BYPASS GRAFTING (CABG) TIMES ONE USING LEFT INTERNAL MAMMARY ARTERY (N/A)  Patient Location: PACU  Anesthesia Type:General  Level of Consciousness: unresponsive  Airway & Oxygen Therapy: Patient remains intubated per anesthesia plan and Patient placed on Ventilator (see vital sign flow sheet for setting)  Post-op Assessment: Report given to PACU RN and Post -op Vital signs reviewed and stable  Post vital signs: Reviewed and stable  Complications: No apparent anesthesia complications

## 2012-09-16 NOTE — Procedures (Signed)
Extubation Procedure Note  Patient Details:   Name: Gregory [name] DOB: 15-Jul-1940 MRN: [id number]   Airway Documentation:     Evaluation  O2 sats: stable throughout Complications: No apparent complications Patient did tolerate procedure well. Bilateral Breath Sounds: Clear   Yes  Pt tolerated rapid wean, -20 NIF, VC and positive for cuff leak. Pt extubated to 4lpm [place]. No stridor or dyspnea noted after extubation. Pt resting comfortably at this time, vitals are within normal limits. RT Will continue to monitor.   Beatris Si 09/16/2012, 5:23 PM

## 2012-09-17 ENCOUNTER — Encounter (HOSPITAL_COMMUNITY): Payer: Self-pay | Admitting: Surgery

## 2012-09-17 ENCOUNTER — Inpatient Hospital Stay (HOSPITAL_COMMUNITY): Payer: Medicare Other

## 2012-09-17 LAB — CBC
MCH: 29 pg (ref 26.0–34.0)
MCV: 82.1 fL (ref 78.0–100.0)
Platelets: 178 10*3/uL (ref 150–400)
Platelets: 156 10*3/uL (ref 150–400)
RBC: 3.49 MIL/uL — ABNORMAL LOW (ref 4.22–5.81)
RBC: 3.35 MIL/uL — ABNORMAL LOW (ref 4.22–5.81)
RDW: 14.6 % (ref 11.5–15.5)
RDW: 15.1 % (ref 11.5–15.5)
WBC: 10.6 10*3/uL — ABNORMAL HIGH (ref 4.0–10.5)
WBC: 11 10*3/uL — ABNORMAL HIGH (ref 4.0–10.5)

## 2012-09-17 LAB — BASIC METABOLIC PANEL
CO2: 25 mEq/L (ref 19–32)
Chloride: 101 mEq/L (ref 96–112)
GFR calc Af Amer: >90 mL/min (ref >90)
Potassium: 4.2 mEq/L (ref 3.5–5.1)

## 2012-09-17 LAB — GLUCOSE, CAPILLARY
Glucose-Capillary: 113 mg/dL — ABNORMAL HIGH (ref 70–99)
Glucose-Capillary: 118 mg/dL — ABNORMAL HIGH (ref 70–99)
Glucose-Capillary: 118 mg/dL — ABNORMAL HIGH (ref 70–99)
Glucose-Capillary: 125 mg/dL — ABNORMAL HIGH (ref 70–99)
Glucose-Capillary: 136 mg/dL — ABNORMAL HIGH (ref 70–99)
Glucose-Capillary: 142 mg/dL — ABNORMAL HIGH (ref 70–99)

## 2012-09-17 LAB — POCT I-STAT, CHEM 8
Calcium, Ion: 1.21 mmol/L (ref 1.13–1.30)
Glucose, Bld: 130 mg/dL — ABNORMAL HIGH (ref 70–99)
HCT: 27 % — ABNORMAL LOW (ref 39.0–52.0)
Hemoglobin: 9.2 g/dL — ABNORMAL LOW (ref 13.0–17.0)

## 2012-09-17 LAB — CREATININE, SERUM: Creatinine, Ser: 0.88 mg/dL (ref 0.50–1.35)

## 2012-09-17 LAB — MAGNESIUM: Magnesium: 2.3 mg/dL (ref 1.5–2.5)

## 2012-09-17 MED ORDER — INSULIN ASPART 100 UNIT/ML ~~LOC~~ SOLN
0.0000 [IU] | SUBCUTANEOUS | Status: DC
  Administered 2012-09-17: 2 [IU] via SUBCUTANEOUS

## 2012-09-17 MED FILL — Magnesium Sulfate Inj 50%: INTRAMUSCULAR | Qty: 10 | Status: AC

## 2012-09-17 MED FILL — Potassium Chloride Inj 2 mEq/ML: INTRAVENOUS | Qty: 40 | Status: AC

## 2012-09-17 NOTE — Op Note (Signed)
NAME:  Gregory [name], Gregory [name] [name] NO.:  [id number]  MEDICAL RECORD NO.:  [id number]  LOCATION:  2310                         FACILITY:  [facility]  PHYSICIAN:  Evelene Croon, M.D.     DATE OF BIRTH:  Apr 26, 1941  DATE OF PROCEDURE:  09/16/2012 DATE OF DISCHARGE:                              OPERATIVE REPORT   PREOPERATIVE DIAGNOSIS:  Chronic dominant ostial left circumflex occlusion with marked inferolateral ischemia.  POSTOPERATIVE DIAGNOSIS:  Chronic dominant ostial left circumflex occlusion with marked inferolateral ischemia.  OPERATIVE PROCEDURE:  Median sternotomy, extracorporeal circulation, coronary artery bypass graft surgery x1 using a left internal mammary artery graft to the obtuse marginal branch of the left circumflex coronary artery.  ATTENDING SURGEON:  Evelene Croon, M.D.  ASSISTANT:  Rowe Clack, P.A.-C.  ANESTHESIA:  General endotracheal.  CLINICAL HISTORY:  This patient is a 72 year old gentleman with a history of hypertension who was found to be in atrial fibrillation with rapid ventricular response during routine physical examination.  He was referred to Dr. Jacinto Halim for cardiology evaluation, and related a 16-month history of exertional fatigue and pressure in his chest.  This was occurring with exertion and resolving with rest.  He was also having some symptoms after eating.  He thought this was related to reflux from previous esophageal dilatation for esophageal stricture.  A 2D echocardiogram on August 13, 2012, showed normal left ventricular ejection fraction with moderate left ventricular hypertrophy and mild mitral regurgitation.  He underwent a nuclear stress test on August 08, 2012, which showed left ventricular dilatation with stress and large inferior lateral area of severe ischemia.  He was started on Pradaxa for atrial fibrillation and subsequently underwent cardiac catheterization on August 19, 2012.  This showed a dominant left circumflex  coronary artery that was occluded just at the ostium and this vessel filled by collaterals from the septal perforators of the LAD.  There was a small ramus branch that was subtotally occluded and then a moderate-sized marginal branch with mild proximal narrowing in it.  The remainder of the left circumflex was widely patent and fills slowly.  The right coronary artery was a small nondominant vessel.  It was diffusely diseased.  The LAD was a large vessel with mild luminal regularities. There was a large first diagonal with no significant disease.  Ejection fraction was about 60% with no mitral regurgitation.  Given the patient's symptoms and documented large area of inferolateral ischemia, it was felt that revascularization was indicated to prevent further ischemia and infarction, and improve his quality of life.  Dr. Jacinto Halim and I discussed the case and felt that coronary bypass using a left internal mammary graft to the left circumflex system would be the best option.  I discussed the operative procedure with the patient and his wife including alternatives, benefits, and risks including, but not limited to bleeding, blood transfusion, infection, stroke, myocardial infarction, graft failure, and death.  He understood and agreed to proceed.  OPERATIVE PROCEDURE:  The patient was taken operative room and placed on the table in a supine position.  Preoperative intravenous antibiotics were given.  After induction of general endotracheal anesthesia, a Foley catheter was placed in bladder using sterile technique.  Then, the chest, abdomen, and both lower extremities were prepped and draped in usual sterile manner.  Time-out was taken.  Proper patient, proper operation were confirmed with nursing and anesthesia staff.  The chest was then opened through a median sternotomy incision and the pericardium opened in midline.  Examination of the heart showed good right ventricular contractility.  The  ascending aorta was of normal size and had no palpable plaques in it.  Then, the left internal mammary artery was harvested from the chest wall as a pedicle graft.  This was a medium-caliber vessel with excellent blood flow through it.  Then the patient was heparinized and when an adequate ACT was obtained, the distal ascending aorta was cannulated using a 20-French aortic cannula for arterial inflow.  Venous outflow was achieved using a two-stage venous cannula for the right atrial appendage.  An antegrade cardioplegia and vent cannula was inserted in the aortic root.  The patient was placed on cardiopulmonary bypass and the distal coronary artery was identified.  The left circumflex gave off a moderate-sized marginal, which was graftable and had no distal disease in it.  I could not feel any proximal plaque in the vessel.  The distal branches of the left circumflex were smaller vessels which themselves did not appear large enough to graft, but would fill well from the obtuse marginal branch.  Then, the aorta was crossclamped and 1000 mL of cold blood antegrade cardioplegia was administered in the aortic root with quick arrest of the heart.  Topical hypothermia with iced saline was used.  Normothermia was maintained.  A temperature probe was placed in septum, insulating pad in the pericardium.  Then, the distal anastomosis was performed to the obtuse marginal branch.  The left internal mammary artery pedicle was brought through an opening in the left pericardium anterior to the phrenic nerve.  It was anastomosed to the obtuse marginal branch in an end-to-side manner using continuous 8-0 Prolene suture.  The pedicle was sutured to the epicardium with 6-0 Prolene sutures.  The clamp was removed from the mammary pedicle.  There was rapid flow of blood seen down the obtuse marginal branch, and return of lateral wall activity.  Then, the cross- clamp was removed with a time of 36  minutes.  There was spontaneous return of ventricular fibrillation and the patient was defibrillated into sinus rhythm.  The distal anastomosis appeared hemostatic.  Two temporary right ventricular and right atrial pacing wires were placed and brought out through the skin.  When the patient rewarmed to 37 degrees centigrade, he was weaned from cardiopulmonary bypass on no inotropic agents.  Total bypass time was 47 minutes.  Cardiac function appeared excellent.  Cardiac output of 7 L/minute.  Protamine was given and the venous and aortic cannulas were removed without difficulty.  Hemostasis was achieved.  Three chest tubes were placed with 2 in the posterior pericardium, 1 in the anterior mediastinal, and 1 in the left pleural space.  The sternum was reapproximated with double #6 stainless steel wires.  The fascia was closed with continuous #1 Vicryl suture.  Subcutaneous tissue was closed with continuous 2-0 Vicryl and the skin with a 3-0 Vicryl subcuticular closure.  The sponge, needle, and instrument counts were correct according to the scrub nurse.  Dry sterile dressing was applied over the incisions around the chest tubes, which were hooked to Pleur-evac suction.  The patient remained hemodynamically stable and was transported to the SICU in guarded, but stable condition.  Evelene Croon, M.D.    BB/MEDQ  D:  09/16/2012  T:  09/17/2012  Job:  [id number]  cc:   [name], MD

## 2012-09-17 NOTE — Progress Notes (Signed)
1 Day Post-Op Procedure(s) (LRB): CORONARY ARTERY BYPASS GRAFTING (CABG) TIMES ONE USING LEFT INTERNAL MAMMARY ARTERY (N/A) Subjective: No complaints  Objective: Vital signs in last 24 hours: Temp:  [96.3 F (35.7 C)-99.7 F (37.6 C)] 99.3 F (37.4 C) (04/30 0700) Pulse Rate:  [66-81] 80 (04/30 0700) Cardiac Rhythm:  [-] Atrial paced (04/30 0400) Resp:  [8-29] 15 (04/30 0700) BP: (89-135)/(37-82) 98/55 mmHg (04/30 0700) SpO2:  [94 %-100 %] 95 % (04/30 0700) Arterial Line BP: (92-135)/(46-78) 104/47 mmHg (04/30 0700) FiO2 (%):  [40 %-50 %] 40 % (04/29 1640) Weight:  [97.1 kg (214 lb 1.1 oz)] 97.1 kg (214 lb 1.1 oz) (04/30 0500)  Hemodynamic parameters for last 24 hours:    Intake/Output from previous day: 04/29 0701 - 04/30 0700 In: 5051.1 [I.V.:2397.1; Blood:904; IV Piggyback:1750] Out: 4310 [Urine:2305; Blood:1200; Chest Tube:805] Intake/Output this shift:    General appearance: alert and cooperative Neurologic: intact Heart: regular rate and rhythm, S1, S2 normal, no murmur, click, rub or gallop Lungs: clear to auscultation bilaterally Extremities: extremities normal, atraumatic, no cyanosis or edema Wound: dressing dry  Lab Results:  Recent Labs  09/16/12 1800 09/16/12 1844 09/17/12 0340  WBC 11.2*  --  10.6*  HGB 11.6* 11.6* 10.2*  HCT 33.0* 34.0* 28.6*  PLT 173  --  178   BMET:  Recent Labs  09/16/12 1844 09/17/12 0340  NA 140 134*  [name] 4.2 4.2  CL 104 101  CO2  --  25  GLUCOSE 127* 137*  BUN 15 14  CREATININE 1.00 0.92  CALCIUM  --  8.1*    PT/INR:  Recent Labs  09/16/12 1230  LABPROT 17.4*  INR 1.47   ABG    Component Value Date/Time   PHART 7.337* 09/16/2012 1900   HCO3 25.8* 09/16/2012 1900   TCO2 27 09/16/2012 1900   ACIDBASEDEF 1.0 09/16/2012 1900   O2SAT 99.0 09/16/2012 1900   CBG (last 3)   Recent Labs  09/16/12 2151 09/17/12 0004 09/17/12 0433  GLUCAP 142* 118* 125*   CXR:  Bibasilar atelectasis  Assessment/Plan: S/P  Procedure(s) (LRB): CORONARY ARTERY BYPASS GRAFTING (CABG) TIMES ONE USING LEFT INTERNAL MAMMARY ARTERY (N/A) Wean neo Mobilize d/c tubes/lines See progression orders   LOS: 1 day    Gregory [name],Gregory [name] 09/17/2012

## 2012-09-17 NOTE — Progress Notes (Signed)
Patient ID: Gregory [name], male   DOB: 08-Apr-1941, 72 y.o.   MRN: [id number]   Hemodynamically stable on low dose neo.  Urine output adequate. Have not diuresed yet since still on neo.  CBC    Component Value Date/Time   WBC 11.0* 09/17/2012 1700   RBC 3.35* 09/17/2012 1700   HGB 9.2* 09/17/2012 1716   HCT 27.0* 09/17/2012 1716   PLT 156 09/17/2012 1700   MCV 82.1 09/17/2012 1700   MCH 29.0 09/17/2012 1700   MCHC 35.3 09/17/2012 1700   RDW 15.1 09/17/2012 1700   LYMPHSABS 1.4 10/18/2009 2025   MONOABS 0.4 10/18/2009 2025   EOSABS 0.1 10/18/2009 2025   BASOSABS 0.0 10/18/2009 2025    BMET    Component Value Date/Time   NA 135 09/17/2012 1716   K 4.1 09/17/2012 1716   CL 98 09/17/2012 1716   CO2 25 09/17/2012 0340   GLUCOSE 130* 09/17/2012 1716   BUN 13 09/17/2012 1716   CREATININE 0.80 09/17/2012 1716   CALCIUM 8.1* 09/17/2012 0340   GFRNONAA 84* 09/17/2012 1700   GFRAA >90 09/17/2012 1700

## 2012-09-18 ENCOUNTER — Inpatient Hospital Stay (HOSPITAL_COMMUNITY): Payer: Medicare Other

## 2012-09-18 LAB — GLUCOSE, CAPILLARY
Glucose-Capillary: 102 mg/dL — ABNORMAL HIGH (ref 70–99)
Glucose-Capillary: 89 mg/dL (ref 70–99)

## 2012-09-18 LAB — CBC
HCT: 24.7 % — ABNORMAL LOW (ref 39.0–52.0)
MCHC: 35.6 g/dL (ref 30.0–36.0)
MCV: 82.6 fL (ref 78.0–100.0)
Platelets: 135 10*3/uL — ABNORMAL LOW (ref 150–400)
RDW: 15 % (ref 11.5–15.5)

## 2012-09-18 LAB — BASIC METABOLIC PANEL
BUN: 11 mg/dL (ref 6–23)
Calcium: 8.5 mg/dL (ref 8.4–10.5)
Chloride: 104 mEq/L (ref 96–112)
Creatinine, Ser: 0.89 mg/dL (ref 0.50–1.35)
GFR calc Af Amer: >90 mL/min (ref >90)

## 2012-09-18 MED ORDER — FUROSEMIDE 10 MG/ML IJ SOLN
40.0000 mg | Freq: Once | INTRAMUSCULAR | Status: AC
  Administered 2012-09-18: 40 mg via INTRAVENOUS
  Filled 2012-09-18: qty 4

## 2012-09-18 MED ORDER — AMIODARONE HCL 200 MG PO TABS
400.0000 mg | ORAL_TABLET | Freq: Two times a day (BID) | ORAL | Status: DC
  Administered 2012-09-18 – 2012-09-19 (×3): 400 mg via ORAL
  Filled 2012-09-18 (×4): qty 2

## 2012-09-18 MED ORDER — POTASSIUM CHLORIDE CRYS ER 20 MEQ PO TBCR
40.0000 meq | EXTENDED_RELEASE_TABLET | Freq: Once | ORAL | Status: AC
  Administered 2012-09-18: 40 meq via ORAL
  Filled 2012-09-18: qty 2

## 2012-09-18 MED ORDER — AMIODARONE IV BOLUS ONLY 150 MG/100ML
150.0000 mg | Freq: Once | INTRAVENOUS | Status: AC
  Administered 2012-09-18: 150 mg via INTRAVENOUS
  Filled 2012-09-18: qty 100

## 2012-09-18 NOTE — Progress Notes (Signed)
Attempted to ambulate patient this AM.After standing briefly, pt c/o dizziness and 'not feeling right'. He began to have an unsteady gait and was placed back into bed. Pt's vitals remained stable throughout event.   Gregory [name]

## 2012-09-18 NOTE — Progress Notes (Signed)
[facility] BRIEF SICU PROGRESS NOTE  2 Days Post-Op  S/P Procedure(s) (LRB): CORONARY ARTERY BYPASS GRAFTING (CABG) TIMES ONE USING LEFT INTERNAL MAMMARY ARTERY (N/A)   Stable day NSR/AAI paced rhythm BP stable off Neo Diuresing well  Plan: Continue current plan  Neima Lacross H 09/18/2012 6:29 PM

## 2012-09-18 NOTE — Progress Notes (Signed)
2 Days Post-Op Procedure(s) (LRB): CORONARY ARTERY BYPASS GRAFTING (CABG) TIMES ONE USING LEFT INTERNAL MAMMARY ARTERY (N/A) Subjective: No complaints but did not sleep much  Objective: Vital signs in last 24 hours: Temp:  [98 F (36.7 C)-99.3 F (37.4 C)] 98.1 F (36.7 C) (05/01 0729) Pulse Rate:  [66-83] 83 (05/01 0700) Cardiac Rhythm:  [-] Atrial paced (05/01 0700) Resp:  [14-25] 23 (05/01 0700) BP: (94-118)/(44-75) 112/52 mmHg (05/01 0700) SpO2:  [88 %-100 %] 100 % (05/01 0500) Arterial Line BP: (98-139)/(45-90) 104/48 mmHg (05/01 0600) Weight:  [98.6 kg (217 lb 6 oz)] 98.6 kg (217 lb 6 oz) (05/01 0500) Rhythm: sinus 70's with frequent PAC's, trying to go into A-fib.  Hemodynamic parameters for last 24 hours:    Intake/Output from previous day: 04/30 0701 - 05/01 0700 In: 660.3 [I.V.:560.3; IV Piggyback:100] Out: 2035 [Urine:1910; Chest Tube:125] Intake/Output this shift:    General appearance: alert and cooperative Neurologic: intact Heart: regular rate and rhythm, S1, S2 normal, no murmur, click, rub or gallop and frequent extra beats Lungs: diminished breath sounds bibasilar Extremities: edema mild Wound: incision ok  Lab Results:  Recent Labs  09/17/12 1700 09/17/12 1716 09/18/12 0407  WBC 11.0*  --  8.7  HGB 9.7* 9.2* 8.8*  HCT 27.5* 27.0* 24.7*  PLT 156  --  135*   BMET:  Recent Labs  09/17/12 0340  09/17/12 1716 09/18/12 0407  NA 134*  --  135 140  K 4.2  --  4.1 3.9  CL 101  --  98 104  CO2 25  --   --  28  GLUCOSE 137*  --  130* 116*  BUN 14  --  13 11  CREATININE 0.92  < > 0.80 0.89  CALCIUM 8.1*  --   --  8.5  < > = values in this interval not displayed.  PT/INR:  Recent Labs  09/16/12 1230  LABPROT 17.4*  INR 1.47   ABG    Component Value Date/Time   PHART 7.337* 09/16/2012 1900   HCO3 25.8* 09/16/2012 1900   TCO2 25 09/17/2012 1716   ACIDBASEDEF 1.0 09/16/2012 1900   O2SAT 99.0 09/16/2012 1900   CBG (last 3)   Recent Labs  09/17/12 2001 09/17/12 2358 09/18/12 0400  GLUCAP 118* 95 109*   CXR: bilateral lower lobe atel. Mild pulmonary edema.  Assessment/Plan: S/P Procedure(s) (LRB): CORONARY ARTERY BYPASS GRAFTING (CABG) TIMES ONE USING LEFT INTERNAL MAMMARY ARTERY (N/A) He was off neo and then put back on it this am for decrease in BP. Will wean it as tolerated. I think he will probably go into a-fib with frequent PAC's and bursts of tachycardia. Will give a bolus of amio and start po. Have been holding off on B-Blocker while on neo.  Mobilize Diuresis   LOS: 2 days    Alitzel Cookson K 09/18/2012

## 2012-09-19 MED ORDER — LACTULOSE 10 GM/15ML PO SOLN
10.0000 g | Freq: Every day | ORAL | Status: DC | PRN
  Administered 2012-09-19 – 2012-09-21 (×2): 10 g via ORAL
  Filled 2012-09-19 (×2): qty 15

## 2012-09-19 MED ORDER — ONDANSETRON HCL 4 MG PO TABS: 4.0000 mg | ORAL_TABLET | Freq: Four times a day (QID) | ORAL | Status: DC | PRN

## 2012-09-19 MED ORDER — FAMOTIDINE 20 MG PO TABS
20.0000 mg | ORAL_TABLET | Freq: Two times a day (BID) | ORAL | Status: DC
  Administered 2012-09-19 – 2012-09-22 (×7): 20 mg via ORAL
  Filled 2012-09-19 (×8): qty 1

## 2012-09-19 MED ORDER — BISACODYL 5 MG PO TBEC: 10.0000 mg | DELAYED_RELEASE_TABLET | Freq: Every day | ORAL | Status: DC | PRN

## 2012-09-19 MED ORDER — SODIUM CHLORIDE 0.9 % IJ SOLN: 3.0000 mL | INTRAMUSCULAR | Status: DC | PRN

## 2012-09-19 MED ORDER — TRAMADOL HCL 50 MG PO TABS: 50.0000 mg | ORAL_TABLET | ORAL | Status: DC | PRN

## 2012-09-19 MED ORDER — BISACODYL 10 MG RE SUPP
10.0000 mg | Freq: Every day | RECTAL | Status: DC | PRN
  Administered 2012-09-20: 10 mg via RECTAL
  Filled 2012-09-19 (×2): qty 1

## 2012-09-19 MED ORDER — ONDANSETRON HCL 4 MG/2ML IJ SOLN: 4.0000 mg | Freq: Four times a day (QID) | INTRAMUSCULAR | Status: DC | PRN

## 2012-09-19 MED ORDER — FUROSEMIDE 40 MG PO TABS
40.0000 mg | ORAL_TABLET | Freq: Every day | ORAL | Status: AC
  Administered 2012-09-19 – 2012-09-21 (×3): 40 mg via ORAL
  Filled 2012-09-19 (×3): qty 1

## 2012-09-19 MED ORDER — ASPIRIN EC 325 MG PO TBEC
325.0000 mg | DELAYED_RELEASE_TABLET | Freq: Every day | ORAL | Status: DC
  Administered 2012-09-19 – 2012-09-22 (×4): 325 mg via ORAL
  Filled 2012-09-19 (×4): qty 1

## 2012-09-19 MED ORDER — POLYVINYL ALCOHOL 1.4 % OP SOLN
1.0000 [drp] | OPHTHALMIC | Status: DC | PRN
  Administered 2012-09-19 (×2): 1 [drp] via OPHTHALMIC
  Filled 2012-09-19: qty 15

## 2012-09-19 MED ORDER — ACETAMINOPHEN 325 MG PO TABS: 650.0000 mg | ORAL_TABLET | Freq: Four times a day (QID) | ORAL | Status: DC | PRN

## 2012-09-19 MED ORDER — OXYCODONE HCL 5 MG PO TABS
5.0000 mg | ORAL_TABLET | ORAL | Status: DC | PRN
  Administered 2012-09-20 – 2012-09-21 (×2): 10 mg via ORAL
  Filled 2012-09-19 (×2): qty 2

## 2012-09-19 MED ORDER — SODIUM CHLORIDE 0.9 % IV SOLN: 250.0000 mL | INTRAVENOUS | Status: DC | PRN

## 2012-09-19 MED ORDER — AMIODARONE HCL 200 MG PO TABS
200.0000 mg | ORAL_TABLET | Freq: Two times a day (BID) | ORAL | Status: DC
  Administered 2012-09-19 – 2012-09-22 (×6): 200 mg via ORAL
  Filled 2012-09-19 (×7): qty 1

## 2012-09-19 MED ORDER — MOVING RIGHT ALONG BOOK
Freq: Once | Status: AC
  Administered 2012-09-19: 11:00:00
  Filled 2012-09-19: qty 1

## 2012-09-19 MED ORDER — DOCUSATE SODIUM 100 MG PO CAPS
200.0000 mg | ORAL_CAPSULE | Freq: Every day | ORAL | Status: DC
  Administered 2012-09-19 – 2012-09-22 (×4): 200 mg via ORAL
  Filled 2012-09-19 (×4): qty 2

## 2012-09-19 MED ORDER — POTASSIUM CHLORIDE CRYS ER 20 MEQ PO TBCR
40.0000 meq | EXTENDED_RELEASE_TABLET | Freq: Every day | ORAL | Status: AC
  Administered 2012-09-19 – 2012-09-21 (×3): 40 meq via ORAL
  Filled 2012-09-19 (×3): qty 2

## 2012-09-19 MED ORDER — SODIUM CHLORIDE 0.9 % IJ SOLN
3.0000 mL | Freq: Two times a day (BID) | INTRAMUSCULAR | Status: DC
  Administered 2012-09-19 – 2012-09-21 (×5): 3 mL via INTRAVENOUS

## 2012-09-19 NOTE — Clinical Documentation Improvement (Signed)
GENERIC DOCUMENTATION CLARIFICATION QUERY  THIS DOCUMENT IS NOT A PERMANENT PART OF THE MEDICAL RECORD  TO RESPOND TO THE THIS QUERY, FOLLOW THE INSTRUCTIONS BELOW:  1. If needed, update documentation for the patient's encounter via the notes activity.  2. Access this query again and click edit on the In [employer].  3. After updating, or not, click F2 to complete all highlighted (required) fields concerning your review. Select "additional documentation in the medical record" OR "no additional documentation provided".  4. Click Sign note button.  5. The deficiency will fall out of your In Basket *Please let us know if you are not able to complete this workflow by phone or e-mail (listed below).  Please update your documentation within the medical record to reflect your response to this query.                                                                                        09/19/12   Dear Dr. Laneta Simmers / Associates,  In a better effort to capture your patient's severity of illness, reflect appropriate length of stay and utilization of resources, a review of the patient medical record has revealed the following indicators.    Based on your clinical judgment, please clarify and document in a progress note and/or discharge summary the clinical condition associated with the following supporting information:  In responding to this query please exercise your independent judgment.  The fact that a query is asked, does not imply that any particular answer is desired or expected.    Possible Clinical Conditions?  _______Atelectasis   _______Bilateral Effusions  _______Other Condition  _______Cannot Clinically Determine       Risk Factors:  Diagnostics: 5/01: CXR results: suspected worsening Atelectasis and asymmetric pulmonary edema, R>L; unchanged small/trace Bilateral Effusions.    You may use possible, probable, or suspect with inpatient documentation. possible,  probable, suspected diagnoses MUST be documented at the time of discharge  Reviewed: Bibasilar Atelectasis documented in the progress notes.  Thank You,  Marciano Sequin,  Clinical Documentation Specialist:  Phone: [phone]   [facility] [place]

## 2012-09-19 NOTE — Progress Notes (Signed)
3 Days Post-Op Procedure(s) (LRB): CORONARY ARTERY BYPASS GRAFTING (CABG) TIMES ONE USING LEFT INTERNAL MAMMARY ARTERY (N/A) Subjective: No complaints. Has not had BM yet  Objective: Vital signs in last 24 hours: Temp:  [97.7 F (36.5 C)-98.9 F (37.2 C)] 98.3 F (36.8 C) (05/02 0819) Pulse Rate:  [59-82] 63 (05/02 0700) Cardiac Rhythm:  [-] Normal sinus rhythm (05/01 1600) Resp:  [14-23] 22 (05/02 0700) BP: (87-119)/(31-57) 114/57 mmHg (05/02 0700) SpO2:  [90 %-99 %] 95 % (05/02 0700) Arterial Line BP: (85-137)/(34-61) 109/44 mmHg (05/02 0700)  Hemodynamic parameters for last 24 hours:    Intake/Output from previous day: 05/01 0701 - 05/02 0700 In: 1201.3 [P.O.:760; I.V.:391.3; IV Piggyback:50] Out: 2900 [Urine:2900] Intake/Output this shift:    General appearance: alert and cooperative Neurologic: intact Heart: regular rate and rhythm, S1, S2 normal, no murmur, click, rub or gallop Lungs: clear to auscultation bilaterally Extremities: edema mild Wound: incision ok  Lab Results:  Recent Labs  09/17/12 1700 09/17/12 1716 09/18/12 0407  WBC 11.0*  --  8.7  HGB 9.7* 9.2* 8.8*  HCT 27.5* 27.0* 24.7*  PLT 156  --  135*   BMET:  Recent Labs  09/17/12 0340  09/17/12 1716 09/18/12 0407  NA 134*  --  135 140  K 4.2  --  4.1 3.9  CL 101  --  98 104  CO2 25  --   --  28  GLUCOSE 137*  --  130* 116*  BUN 14  --  13 11  CREATININE 0.92  < > 0.80 0.89  CALCIUM 8.1*  --   --  8.5  < > = values in this interval not displayed.  PT/INR:  Recent Labs  09/16/12 1230  LABPROT 17.4*  INR 1.47   ABG    Component Value Date/Time   PHART 7.337* 09/16/2012 1900   HCO3 25.8* 09/16/2012 1900   TCO2 25 09/17/2012 1716   ACIDBASEDEF 1.0 09/16/2012 1900   O2SAT 99.0 09/16/2012 1900   CBG (last 3)   Recent Labs  09/18/12 1615 09/18/12 2017 09/18/12 2329  GLUCAP 89 98 111*    Assessment/Plan: S/P Procedure(s) (LRB): CORONARY ARTERY BYPASS GRAFTING (CABG) TIMES ONE  USING LEFT INTERNAL MAMMARY ARTERY (N/A) Mobilize Diuresis Plan for transfer to step-down: see transfer orders Maintaining sinus on po amio. Will plan to continue this for 4 weeks. HR 60's so will not start B-Blocker until amio stopped.   LOS: 3 days    BARTLE,BRYAN K 09/19/2012

## 2012-09-19 NOTE — Progress Notes (Signed)
Per pt, pt ambulated on 2300 and walked from unit to 2000 during transfer.

## 2012-09-20 MED ORDER — MENTHOL 3 MG MT LOZG: 1.0000 | LOZENGE | OROMUCOSAL | Status: DC | PRN

## 2012-09-20 MED ORDER — MAGNESIUM HYDROXIDE 400 MG/5ML PO SUSP
30.0000 mL | Freq: Every day | ORAL | Status: DC | PRN
  Administered 2012-09-20: 30 mL via ORAL
  Filled 2012-09-20 (×2): qty 30

## 2012-09-20 NOTE — Progress Notes (Addendum)
CARDIAC REHAB PHASE I   PRE:  Rate/Rhythm: 71 sinus  BP:  Supine:   Sitting: 120/60  Standing:    SaO2: 99% ra  MODE:  Ambulation: 400 ft   POST:  Rate/Rhythem: 78 sinus  BP:  Supine:   Sitting: 130/78  Standing:    SaO2: 98% ra  1015-1115 Pt ambulated in hallway using rolling walker,slow steady gait.  Took 3 standing rest breaks. Tolerated well.  Pt has frequent non productive cough.  IS use and good pulmonary toilet enforced. Education on restrictions, activity progression and nutrition given. Pt and family instructed how to watch CABG video.  Understanding verbalized   Dan Europe

## 2012-09-20 NOTE — Progress Notes (Addendum)
                    [address]            [place] [address]          [phone]     4 Days Post-Op Procedure(s) (LRB): CORONARY ARTERY BYPASS GRAFTING (CABG) TIMES ONE USING LEFT INTERNAL MAMMARY ARTERY (N/A)  Subjective: Feels well, slight nonproductive cough.  Still no BM.   Objective: Vital signs in last 24 hours: Patient Vitals for the past 24 hrs:  BP Temp Temp src Pulse Resp SpO2 Weight  09/20/12 0446 122/56 mmHg 98.8 F (37.1 C) Oral 74 16 96 % 210 lb 1.6 oz (95.3 kg)  09/19/12 1948 102/59 mmHg 98.3 F (36.8 C) Oral 67 18 100 % -  09/19/12 1109 128/56 mmHg 97.4 F (36.3 C) Oral 65 18 100 % -  09/19/12 1000 108/52 mmHg - - 66 21 98 % -  09/19/12 0900 105/53 mmHg - - 68 20 94 % -  09/19/12 0819 - 98.3 F (36.8 C) Oral - - - -   Current Weight  09/20/12 210 lb 1.6 oz (95.3 kg)   PRE-OPERATIVE WEIGHT: 95.2kg      Intake/Output from previous day: 05/02 0701 - 05/03 0700 In: 60 [I.V.:60] Out: 540 [Urine:540]    PHYSICAL EXAM:  Heart: RRR Lungs: Clear Wound: Clean and dry Extremities: Trace LE edema     Lab Results: CBC: Recent Labs  09/17/12 1700 09/17/12 1716 09/18/12 0407  WBC 11.0*  --  8.7  HGB 9.7* 9.2* 8.8*  HCT 27.5* 27.0* 24.7*  PLT 156  --  135*   BMET:  Recent Labs  09/17/12 1716 09/18/12 0407  NA 135 140  K 4.1 3.9  CL 98 104  CO2  --  28  GLUCOSE 130* 116*  BUN 13 11  CREATININE 0.80 0.89  CALCIUM  --  8.5    PT/INR: No results found for this basename: LABPROT, INR,  in the last 72 hours    Assessment/Plan: S/P Procedure(s) (LRB): CORONARY ARTERY BYPASS GRAFTING (CABG) TIMES ONE USING LEFT INTERNAL MAMMARY ARTERY (N/A) CV- AF, now SR.  HR has been stable in 60s-70s, so will d/c pacer box, roll and tape EPWs. Continue Amio, no beta blocker for now due to bradycardia. Vol overload- diurese. CRPI, pulm toilet. LOC today. Possibly home Sunday/Monday if remains stable.    LOS: 4 days     COLLINS,GINA H 09/20/2012  I have seen and examined Gregory [name] and agree with the above assessment  and plan.  Delight Ovens MD Beeper [phone] Office [phone] 09/20/2012 11:04 AM

## 2012-09-21 LAB — BASIC METABOLIC PANEL
BUN: 20 mg/dL (ref 6–23)
GFR calc non Af Amer: 62 mL/min — ABNORMAL LOW (ref >90)
Glucose, Bld: 105 mg/dL — ABNORMAL HIGH (ref 70–99)
Potassium: 4.1 mEq/L (ref 3.5–5.1)

## 2012-09-21 NOTE — Discharge Summary (Signed)
[address]            [place] [address]          [phone]         Discharge Summary  Name: Gregory [name] DOB: [birth year] 72 y.o. MRN: [id number]   Admission Date: 09/16/2012 Discharge Date: 09/22/2012    Admitting Diagnosis: Coronary artery disease   Discharge Diagnosis:  Coronary artery disease Postoperative atrial fibrillation Postoperative bradycardia Expected postoperative blood loss anemia  Past Medical History  Diagnosis Date  . Esophageal stricture   . Deformed pylorus, acquired   . Erythema of esophagus   . AAA (abdominal aortic aneurysm)   . Fatty infiltration of liver   . Splenomegaly   . AF (atrial fibrillation) 2014  . S/P cardiac cath 08/12/12  . Abnormal stress test 08/08/12  . Hyperlipidemia   . Bilateral carotid artery stenosis 08/14/12    moderate bilaterally per carotid duplex  . CAD (coronary artery disease)   . GERD (gastroesophageal reflux disease)      Procedures: CORONARY ARTERY BYPASS GRAFTING x 1 (Left internal mammary artery to obtuse marginal) - 09/16/2012   HPI:  The patient is a 72 y.o. male with a history of hyperlipidemia who was recently found to be in atrial fibrillation with a rapid ventricular response during a routine physical examination. The patient was referred to Dr. Jacinto Halim for further evaluation and the patient and his wife related at least a six-month history of exertional fatigue and a pressure sensation in his chest that he calls a "grab". This occurs with a mild level of exertion and resolves with rest. He has also noticed symptoms after eating. He thought that this was likely related to reflux since he has had previous esophageal dilatation for esophageal stricture. A 2-D echocardiogram from 08/13/2012 showed a normal left ventricular ejection fraction with moderate left ventricular hypertrophy and mild mitral regurgitation. He also underwent a Lexiscan stress test on 08/08/2012  which showed left ventricular dilatation with stress and a large inferior and lateral area of severe ischemia. He was started on Pradaxa for atrial fibrillation. He subsequently underwent cardiac catheterization on 08/19/2012. This showed a dominant left circumflex coronary artery that is occluded just after the ostium and filled by collaterals from the septal perforators of the LAD. There is a small ramus that was subtotally occluded and then a moderate-sized marginal branch with 30-40% proximal stenosis. The remainder of the left circumflex was widely patent and filled slowly. The right coronary was a small, nondominant vessel that was diffusely diseased. The LAD was a large vessel with mild luminal irregularities. There is a large first diagonal branch that had no significant stenosis. Left ventricular ejection fraction is about 60% with no mitral regurgitation.  He was referred to Dr. Laneta Simmers for cardiac surgery evaluation, and was felt to be a good candidate for CABG.  All risks, benefits and alternatives of surgery were explained in detail, and the patient agreed to proceed.    Hospital Course:  The patient was admitted to [facility] on 09/16/2012.  The patient was taken to the operating room and underwent the above procedure.    The postoperative course has generally been uneventful.  He  had runs of atrial fibrillation and sinus rhythm with frequent PACs, and was started on oral amiodarone.  He has not been started on a beta blocker secondary to bradycardia.  He has otherwise done well postoperatively.  He is ambulating in the halls, and has been started on Lasix for volume overload.  He is currently in rate controlled atrial fibrillation, and has been started on low dose Pradaxa.  He is tolerating a regular diet.  He remains afebrile and all vital signs are stable.  He was seen on 09/22/2012 and was deemed ready for discharge home.     Recent vital signs:  Filed Vitals:   09/20/12 2101  BP:  134/60  Pulse: 73  Temp: 98.3 F (36.8 C)  Resp: 18    Recent laboratory studies:  CBC:No results found for this basename: WBC, HGB, HCT, PLT,  in the last 72 hours BMET:  Recent Labs  09/21/12 0525  NA 138  K 4.1  CL 102  CO2 29  GLUCOSE 105*  BUN 20  CREATININE 1.14  CALCIUM 9.0    PT/INR: No results found for this basename: LABPROT, INR,  in the last 72 hours   Discharge Medications:     Medication List    STOP taking these medications       metoprolol tartrate 25 MG tablet  Commonly known as:  LOPRESSOR      TAKE these medications       amiodarone 200 MG tablet  Commonly known as:  PACERONE  Take 1 tablet (200 mg total) by mouth 2 (two) times daily.     aspirin EC 81 MG tablet  Take 81 mg by mouth daily.     atorvastatin 80 MG tablet  Commonly known as:  LIPITOR  Take 80 mg by mouth daily.     calcium carbonate 500 MG chewable tablet  Commonly known as:  TUMS - dosed in mg elemental calcium  Chew 2 tablets by mouth 3 (three) times daily as needed for heartburn.     dabigatran 75 MG Caps  Commonly known as:  PRADAXA  Take 2 capsules (150 mg total) by mouth every 12 (twelve) hours. Take 1/2 tablet twice daily for 1 week, then 1 tablet twice daily     famotidine 20 MG tablet  Commonly known as:  PEPCID  Take 1 tablet (20 mg total) by mouth 2 (two) times daily.     oxyCODONE 5 MG immediate release tablet  Commonly known as:  Oxy IR/ROXICODONE  Take 1-2 tablets (5-10 mg total) by mouth every 4 (four) hours as needed.          Discharge Instructions:  The patient is to refrain from driving, heavy lifting or strenuous activity.  May shower daily and clean incisions with soap and water.  May resume regular diet.   Follow Up:  Follow-up Information   Follow up with [name], MD. Schedule an appointment as soon as possible for a visit in 2 weeks.   Contact information:   [address] [place] [address] [phone]         Follow up with [name], MD In 3 weeks. (Office will call to schedule appointment)    Contact information:   [address] [employer] [address] [place] [address] [phone]          Adella Hare 09/21/2012, 11:51 AM

## 2012-09-21 NOTE — Progress Notes (Signed)
Pt ambulated in hall with friend 350 ft. Pt tolerated well.

## 2012-09-21 NOTE — Progress Notes (Addendum)
                    [address]            [place] [address]          [phone]     5 Days Post-Op Procedure(s) (LRB): CORONARY ARTERY BYPASS GRAFTING (CABG) TIMES ONE USING LEFT INTERNAL MAMMARY ARTERY (N/A)  Subjective: C/o constipation. Otherwise feels well this am.   Objective: Vital signs in last 24 hours: Patient Vitals for the past 24 hrs:  BP Temp Temp src Pulse Resp SpO2 Weight  09/21/12 0500 - - - - - - 210 lb 4.8 oz (95.391 kg)  09/20/12 2101 134/60 mmHg 98.3 F (36.8 C) Oral 73 18 98 % 210 lb 4.8 oz (95.391 kg)  09/20/12 1209 117/50 mmHg 98.3 F (36.8 C) Oral 66 16 99 % -   Current Weight  09/21/12 210 lb 4.8 oz (95.391 kg)  PRE-OPERATIVE WEIGHT: 95.2kg     Intake/Output from previous day: 05/03 0701 - 05/04 0700 In: 120 [P.O.:120] Out: 875 [Urine:875]    PHYSICAL EXAM:  Heart: RRR Lungs: Clear Wound: Clean and dry Extremities: Mild LE edema    Lab Results: CBC:No results found for this basename: WBC, HGB, HCT, PLT,  in the last 72 hours BMET:  Recent Labs  09/21/12 0525  NA 138  K 4.1  CL 102  CO2 29  GLUCOSE 105*  BUN 20  CREATININE 1.14  CALCIUM 9.0    PT/INR: No results found for this basename: LABPROT, INR,  in the last 72 hours    Assessment/Plan: S/P Procedure(s) (LRB): CORONARY ARTERY BYPASS GRAFTING (CABG) TIMES ONE USING LEFT INTERNAL MAMMARY ARTERY (N/A)  CV- AF, now SR, although he has frequent PACs when walking. Continue Amio. Beta blocker held due to bradycardia. May need to consider starting at low dose if he continues to have AF/PACs.  Vol overload- diurese.   CRPI, pulm toilet.   LOC today.   Disp- probably ready for d/c in am, but pt's wife unable to pick him up tomorrow.  He will try to make arrangements.   LOS: 5 days    COLLINS,GINA H 09/21/2012  I have seen and examined Gregory [name] and agree with the above assessment  and plan.  Delight Ovens MD Beeper [phone] Office  [phone] 09/21/2012 11:31 AM

## 2012-09-22 MED ORDER — DABIGATRAN ETEXILATE MESYLATE 150 MG PO CAPS: 150.0000 mg | ORAL_CAPSULE | Freq: Every day | ORAL | Status: DC

## 2012-09-22 MED ORDER — DABIGATRAN ETEXILATE MESYLATE 75 MG PO CAPS
75.0000 mg | ORAL_CAPSULE | Freq: Two times a day (BID) | ORAL | Status: DC
  Administered 2012-09-22: 75 mg via ORAL
  Filled 2012-09-22 (×2): qty 1

## 2012-09-22 MED ORDER — OXYCODONE HCL 5 MG PO TABS: 5.0000 mg | ORAL_TABLET | ORAL | Status: DC | PRN

## 2012-09-22 MED ORDER — FLEET ENEMA 7-19 GM/118ML RE ENEM
1.0000 | ENEMA | Freq: Every day | RECTAL | Status: DC | PRN
  Filled 2012-09-22: qty 1

## 2012-09-22 MED ORDER — DABIGATRAN ETEXILATE MESYLATE 75 MG PO CAPS: 150.0000 mg | ORAL_CAPSULE | Freq: Two times a day (BID) | ORAL | Status: DC

## 2012-09-22 MED ORDER — AMIODARONE HCL 200 MG PO TABS: 200.0000 mg | ORAL_TABLET | Freq: Two times a day (BID) | ORAL | Status: DC

## 2012-09-22 MED ORDER — FAMOTIDINE 20 MG PO TABS: 20.0000 mg | ORAL_TABLET | Freq: Two times a day (BID) | ORAL | Status: DC

## 2012-09-22 NOTE — Progress Notes (Signed)
Pt had large formed bowel movement, voiced relief Georgette Dover

## 2012-09-22 NOTE — Progress Notes (Addendum)
CARDIAC REHAB PHASE I   PRE:  Rate/Rhythm: 94 Afib  BP:  Supine:   Sitting:   Standing: 96/60   SaO2: would not register  MODE:  Ambulation: 1240 ft   POST:  Rate/Rhythm: 92 Afib  BP:  Supine:   Sitting: to BR after walk  Standing:    SaO2:  0835-0910 Pt tolerated ambulation well without c/o Gait steady VS stable. Pt's only c/o is need to have BM. Discussed Outpt. CRP with pt. He agrees to referral. Pt will decide between [place] or [facility], will send to both places.  Melina Copa RN 09/22/2012 9:20 AM

## 2012-09-22 NOTE — Progress Notes (Signed)
Pt's wife called with question concerning Pradaxa dosage, D/C instructions unclear. Per pharmacy, pt to take 75 mg tonight and call MD tomorrow to verify/clarify dosage.

## 2012-09-22 NOTE — Progress Notes (Addendum)
   [address]       [place] [address]             [phone]    6 Days Post-Op  Procedure(s) (LRB): CORONARY ARTERY BYPASS GRAFTING (CABG) TIMES ONE USING LEFT INTERNAL MAMMARY ARTERY (N/A) Subjective: Feels ok, no BM yet, mild cough  Objective  Telemetry afib with CVR  Temp:  [97.8 F (36.6 C)-98.6 F (37 C)] 98.4 F (36.9 C) (05/05 0448) Pulse Rate:  [70-89] 70 (05/05 0448) Resp:  [16-20] 16 (05/05 0448) BP: (104-136)/(55-72) 104/62 mmHg (05/05 0448) SpO2:  [94 %-100 %] 98 % (05/05 0448) Weight:  [208 lb 8 oz (94.575 kg)] 208 lb 8 oz (94.575 kg) (05/05 0448)   Intake/Output Summary (Last 24 hours) at 09/22/12 0756 Last data filed at 09/22/12 0456  Gross per 24 hour  Intake      0 ml  Output    925 ml  Net   -925 ml       General appearance: alert, cooperative and no distress Heart: irregularly irregular rhythm Lungs: dim in bases Abdomen: benign Extremities: + edema Wound: incisions healing well  Lab Results:  Recent Labs  09/21/12 0525  NA 138  K 4.1  CL 102  CO2 29  GLUCOSE 105*  BUN 20  CREATININE 1.14  CALCIUM 9.0   No results found for this basename: AST, ALT, ALKPHOS, BILITOT, PROT, ALBUMIN,  in the last 72 hours No results found for this basename: LIPASE, AMYLASE,  in the last 72 hours No results found for this basename: WBC, NEUTROABS, HGB, HCT, MCV, PLT,  in the last 72 hours No results found for this basename: CKTOTAL, CKMB, TROPONINI,  in the last 72 hours No components found with this basename: POCBNP,  No results found for this basename: DDIMER,  in the last 72 hours No results found for this basename: HGBA1C,  in the last 72 hours No results found for this basename: CHOL, HDL, LDLCALC, TRIG, CHOLHDL,  in the last 72 hours No results found for this basename: TSH, T4TOTAL, FREET3, T3FREE, THYROIDAB,  in the last 72 hours No results found for this basename: VITAMINB12, FOLATE, FERRITIN, TIBC, IRON, RETICCTPCT,  in the  last 72 hours  Medications: Scheduled . amiodarone  200 mg Oral BID  . aspirin EC  325 mg Oral Daily  . atorvastatin  80 mg Oral Daily  . docusate sodium  200 mg Oral Daily  . famotidine  20 mg Oral BID  . sodium chloride  3 mL Intravenous Q12H     Radiology/Studies:  No results found.  INR: Will add last result for INR, ABG once components are confirmed Will add last 4 CBG results once components are confirmed  Assessment/Plan: S/P Procedure(s) (LRB): CORONARY ARTERY BYPASS GRAFTING (CABG) TIMES ONE USING LEFT INTERNAL MAMMARY ARTERY (N/A)  1 will start pradaxa with cont afib. Per PVT once daily for 1 week then BID 2 Fleet enema for constipation 3 poss home later today vs am     LOS: 6 days    Deanglo Hissong E 5/5/20147:56 AM  patient examined and medical record reviewed,agree with above note. VAN TRIGT III,PETER 09/22/2012   Addendum: per pharm recs due to drug distribution they recommend BID use, so will start at 75 mg bid for 1 week, then 150 mg BID

## 2012-09-22 NOTE — Progress Notes (Signed)
Pt and wife educated and informed of DC information. Verbalizes understanding of f/u appts, medications, and surgical restrictions including lifting and ambulating. IV and monitor removed. Pt ready for DC home.

## 2012-10-14 ENCOUNTER — Other Ambulatory Visit: Payer: Self-pay | Admitting: *Deleted

## 2012-10-14 DIAGNOSIS — I2510 Atherosclerotic heart disease of native coronary artery without angina pectoris: Secondary | ICD-10-CM

## 2012-10-15 ENCOUNTER — Ambulatory Visit
Admission: RE | Admit: 2012-10-15 | Discharge: 2012-10-15 | Disposition: A | Discharge status: Home / Self Care | Payer: Medicare Other | Source: Ambulatory Visit | Attending: Thoracic Surgery (Cardiothoracic Vascular Surgery) | Admitting: Thoracic Surgery (Cardiothoracic Vascular Surgery)

## 2012-10-15 ENCOUNTER — Encounter: Payer: Self-pay | Admitting: Surgery

## 2012-10-15 ENCOUNTER — Ambulatory Visit (INDEPENDENT_AMBULATORY_CARE_PROVIDER_SITE_OTHER): Payer: Self-pay | Admitting: Surgery

## 2012-10-15 VITALS — BP 128/69 | HR 53 | Resp 18 | Ht 70.5 in | Wt 208.0 lb

## 2012-10-15 DIAGNOSIS — Z951 Presence of aortocoronary bypass graft: Secondary | ICD-10-CM

## 2012-10-15 DIAGNOSIS — I2510 Atherosclerotic heart disease of native coronary artery without angina pectoris: Secondary | ICD-10-CM

## 2012-10-15 NOTE — Progress Notes (Signed)
[address]       Gregory [name] [address]             [phone]      HPI:  Patient returns for routine postoperative follow-up having undergone coronary bypass graft surgery x1 using a left internal mammary graft to the obtuse marginal coronary artery on 09/16/2012. The patient's early postoperative recovery while in the hospital was notable for postoperative atrial fibrillation converted with amiodarone. He was started on Pradaxa. Since hospital discharge the patient reports he has been feeling well. The heartburn symptoms that he has had for the past 2 years have completely resolved since surgery. He is walking daily without chest pain or shortness of breath. He has had some intermittent cough during the day particularly when he is talking a lot. This has not been a problem for him at night when he lays down.   Current Outpatient Prescriptions  Medication Sig Dispense Refill  . amiodarone (PACERONE) 200 MG tablet Take 1 tablet (200 mg total) by mouth 2 (two) times daily.  60 tablet  1  . aspirin EC 81 MG tablet Take 81 mg by mouth daily.      [name] atorvastatin (LIPITOR) 80 MG tablet Take 80 mg by mouth daily.      . calcium carbonate (TUMS - DOSED IN MG ELEMENTAL CALCIUM) 500 MG chewable tablet Chew 2 tablets by mouth 3 (three) times daily as needed for heartburn.      . dabigatran (PRADAXA) 75 MG CAPS Take 150 mg by mouth every 12 (twelve) hours.       No current facility-administered medications for this visit.    Physical Exam: BP 128/69  Pulse 53  Resp 18  Ht 5' 10.5" (1.791 m)  Wt 208 lb (94.348 kg)  BMI 29.41 kg/m2  SpO2 98% He looks well. Cardiac exam shows a regular rate and rhythm with normal heart sounds. Lung exam reveals decreased breath sounds in the bases. The chest incision is healing well and sternum is stable. There is mild bilateral ankle edema.  Diagnostic Tests:  *RADIOLOGY REPORT*  Clinical Data: Follow-up CABG. Cough.  CHEST - 2 VIEW    Comparison: 09/17/2012  Findings: Interval removal of right central line and left chest  tube. No pneumothorax. There are small bilateral pleural  effusions. Bibasilar atelectasis, improved since prior study.  Mild cardiomegaly.  IMPRESSION:  Residual small bilateral effusions. Improving but mild persistence  of bibasilar atelectasis.  Original Report Authenticated By: Charlett Nose, M.D.     Impression:  He is doing well following coronary bypass. He still has some mild volume excess with ankle edema and small pleural effusions which are likely the reason for his cough. He said that he took  Lasix and potassium for about 3 days but then stopped. I asked him to resume the Lasix 40 mg per day and potassium 20 mEq per day for one week and to follow his weight and ankle edema. If he notices that the edema resolves and his weight decreases then he could discontinue these. I told him he could return to driving a car but should refrain from lifting anything heavier than 10 pounds for 3 months postoperatively. He appears to be maintaining sinus rhythm. I decreased his amiodarone to 200 mg daily. He has a followup appointment with Dr. Jacinto Halim in June and he can decide about discontinuing the amiodarone and Pradaxa.  Plan: He'll followup with Dr. Jacinto Halim and will contact me if he has any  problems with his incisions.

## 2012-12-24 ENCOUNTER — Other Ambulatory Visit: Payer: Self-pay

## 2013-03-10 DIAGNOSIS — J90 Pleural effusion, not elsewhere classified: Secondary | ICD-10-CM | POA: Insufficient documentation

## 2013-03-12 ENCOUNTER — Ambulatory Visit
Admission: RE | Admit: 2013-03-12 | Discharge: 2013-03-12 | Disposition: A | Discharge status: Home / Self Care | Payer: Medicare Other | Source: Ambulatory Visit | Attending: Cardiology | Admitting: Cardiology

## 2013-03-12 ENCOUNTER — Other Ambulatory Visit: Payer: Self-pay | Admitting: Cardiology

## 2013-03-12 DIAGNOSIS — J90 Pleural effusion, not elsewhere classified: Secondary | ICD-10-CM

## 2013-03-15 ENCOUNTER — Other Ambulatory Visit: Payer: Self-pay | Admitting: Cardiology

## 2013-03-15 DIAGNOSIS — J90 Pleural effusion, not elsewhere classified: Secondary | ICD-10-CM

## 2013-03-16 ENCOUNTER — Other Ambulatory Visit (HOSPITAL_COMMUNITY): Payer: Self-pay | Admitting: Cardiology

## 2013-03-16 DIAGNOSIS — J90 Pleural effusion, not elsewhere classified: Secondary | ICD-10-CM

## 2013-03-18 ENCOUNTER — Ambulatory Visit (HOSPITAL_COMMUNITY)
Admission: RE | Admit: 2013-03-18 | Discharge: 2013-03-18 | Disposition: A | Discharge status: Home / Self Care | Payer: Medicare Other | Source: Ambulatory Visit | Attending: Radiology | Admitting: Radiology

## 2013-03-18 ENCOUNTER — Ambulatory Visit (HOSPITAL_COMMUNITY)
Admission: RE | Admit: 2013-03-18 | Discharge: 2013-03-18 | Disposition: A | Discharge status: Home / Self Care | Payer: Medicare Other | Source: Ambulatory Visit | Attending: Cardiology | Admitting: Cardiology

## 2013-03-18 DIAGNOSIS — Z951 Presence of aortocoronary bypass graft: Secondary | ICD-10-CM | POA: Insufficient documentation

## 2013-03-18 DIAGNOSIS — J90 Pleural effusion, not elsewhere classified: Secondary | ICD-10-CM | POA: Insufficient documentation

## 2013-03-18 NOTE — Procedures (Signed)
Successful US guided left thoracentesis. Yielded 1 liter of blood tinged serous colored fluid. Pt tolerated procedure well. No immediate complications.  Specimen was not sent for labs. CXR ordered.  Pattricia Boss D PA-C 03/18/2013 11:52 AM

## 2013-03-26 ENCOUNTER — Other Ambulatory Visit: Payer: Self-pay

## 2013-04-02 DIAGNOSIS — Z951 Presence of aortocoronary bypass graft: Secondary | ICD-10-CM | POA: Insufficient documentation

## 2013-12-30 ENCOUNTER — Encounter: Payer: Self-pay | Admitting: Gastroenterology

## 2014-04-29 ENCOUNTER — Encounter (HOSPITAL_COMMUNITY): Payer: Self-pay | Admitting: Cardiology

## 2015-09-13 ENCOUNTER — Ambulatory Visit: Payer: Self-pay | Admitting: Sports Medicine

## 2015-09-15 ENCOUNTER — Ambulatory Visit (INDEPENDENT_AMBULATORY_CARE_PROVIDER_SITE_OTHER): Payer: Medicare Other | Admitting: Podiatry

## 2015-09-15 ENCOUNTER — Encounter: Payer: Self-pay | Admitting: Podiatry

## 2015-09-15 VITALS — BP 136/83 | HR 64 | Resp 18

## 2015-09-15 DIAGNOSIS — B351 Tinea unguium: Secondary | ICD-10-CM | POA: Diagnosis not present

## 2015-09-15 DIAGNOSIS — L84 Corns and callosities: Secondary | ICD-10-CM

## 2015-09-15 DIAGNOSIS — M79676 Pain in unspecified toe(s): Secondary | ICD-10-CM | POA: Diagnosis not present

## 2015-09-15 NOTE — Progress Notes (Signed)
Subjective:     Patient ID: Gregory [name], male   DOB: 07-10-1940, 75 y.o.   MRN: [id number]  HPI 75 year old male presents the office they for the concerns of thick, painful, elongated toenails that he cannot trim himself results for painful calluses. No recent injury or trauma. No swelling. No other complaints at this time.   Review of Systems  All other systems reviewed and are negative.      Objective:   Physical Exam General: AAO x3, NAD  Dermatological:Nails are hypertrophic, dystrophic, brittle, discolored, elongated 10. There is no synovitis or drainage. There is hyperkeratotic lesions bilateral submetatarsal heads 4. There is no underlying ulceration, drainage or other signs of infection. No open lesions at this time.  Vascular: Dorsalis Pedis artery and Posterior Tibial artery pedal pulses are 2/4 bilateral with immedate capillary fill time. Pedal hair growth present. No varicosities and no lower extremity edema present bilateral. There is no pain with calf compression, swelling, warmth, erythema.   Neruologic: Grossly intact via light touch bilateral. Vibratory intact via tuning fork bilateral. Protective threshold with Semmes Wienstein monofilament intact to all pedal sites bilateral. Patellar and Achilles deep tendon reflexes 2+ bilateral. No Babinski or clonus noted bilateral.   Musculoskeletal: No gross boney pedal deformities bilateral. No pain, crepitus, or limitation noted with foot and ankle range of motion bilateral. Muscular strength 5/5 in all groups tested bilateral.  Gait: Unassisted, Nonantalgic.      Assessment:     Symptomatic onychomycosis, hyperkeratotic lesions    Plan:     -Treatment options discussed including all alternatives, risks, and complications -Etiology of symptoms were discussed -Nails are brittle 10 without complications or bleeding -Hyperkeratotic lesions debrided 4 without complications or bleeding -Daily foot  inspection -Follow-up in 3 months or sooner if any problems arise. In the meantime, encouraged to call the office with any questions, concerns, change in symptoms.   Gregory [name], DPM

## 2015-10-25 ENCOUNTER — Ambulatory Visit: Payer: Medicare Other | Admitting: Neurology

## 2015-11-02 ENCOUNTER — Ambulatory Visit (INDEPENDENT_AMBULATORY_CARE_PROVIDER_SITE_OTHER): Payer: Medicare Other | Admitting: Neurology

## 2015-11-02 ENCOUNTER — Ambulatory Visit: Payer: Medicare Other | Admitting: Neurology

## 2015-11-02 ENCOUNTER — Encounter: Payer: Self-pay | Admitting: Neurology

## 2015-11-02 VITALS — BP 142/73 | HR 69 | Resp 16 | Ht 70.5 in | Wt 206.0 lb

## 2015-11-02 DIAGNOSIS — G20 Parkinson's disease: Secondary | ICD-10-CM | POA: Diagnosis not present

## 2015-11-02 DIAGNOSIS — G4752 REM sleep behavior disorder: Secondary | ICD-10-CM

## 2015-11-02 MED ORDER — CARBIDOPA-LEVODOPA 25-100 MG PO TABS: ORAL_TABLET | ORAL | Status: DC

## 2015-11-02 NOTE — Patient Instructions (Signed)
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 As discussed this disease does progress with time. It can affect your balance, your memory, your mood, your bowel and bladder function, your posture, balance and walking and your activities of daily living. However, there are good supportive treatments and symptomatic treatments available, so most patients have a change to a good quality life and life expectancy is not typically altered. Overall you are doing fairly well but I do want to suggest a few things today:  Remember to drink plenty of fluid at least 6 glasses (8 oz each), eat healthy meals and do not skip any meals. Try to eat protein with a every meal and eat a healthy snack such as fruit or nuts in between meals. Try to keep a regular sleep-wake schedule and try to exercise daily, particularly in the form of walking, 20-30 minutes a day, if you can.   Taking your medication on schedule is key.   Try to stay active physically and mentally. Engage in social activities in your community and with your family and try to keep up with current events by reading the newspaper or watching the news. Try to do word puzzles and you may like to do puzzles and brain games on the computer such as on [email or web address].   As far as your medications are concerned, I would like to suggest medication to try: Sinemet (generic name: carbidopa-levodopa) 25/100 mg: Take half a pill twice daily (8 AM and noon) for one week, then half a pill 3 times a day (8 AM, noon, and 4 PM) for one week, then one pill 3 times a day thereafter. Please try to take the medication away from you mealtimes, that is, ideally either one hour before or 2 hours after your meal to ensure optimal absorption. The medication can interfere with the protein content of your meal and trying to the protein in your food and therefore not get fully absorbed.  Common side effects reported are: Nausea, vomiting, sedation, confusion, lightheadedness. Rare  side effects include hallucinations, severe nausea or vomiting, diarrhea and significant drop in blood pressure especially when going from lying to standing or from sitting to standing.   As far as diagnostic testing, I will order: brain scan, called MRI and call you with the test results. We will have to schedule you for this on a separate date. This test requires authorization from your insurance, and we will take care of the insurance process.  I would like to see you back in 3 months, sooner if we need to. Please call us with any interim questions, concerns, problems, updates or refill requests.  Our phone number is [phone]. We also have an after hours call service for urgent matters and there is a physician on-call for urgent questions, that cannot wait till the next work day. For any emergencies you know to call 911 or go to the nearest emergency room.   You can email me through my chart and also leave a phone message for Lafonda Mossesiana, my nurse.
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
## 2015-11-04 ENCOUNTER — Encounter: Payer: Self-pay | Admitting: Neurology

## 2015-11-04 ENCOUNTER — Telehealth: Payer: Self-pay | Admitting: Neurology

## 2015-11-04 NOTE — Telephone Encounter (Signed)
Called patient and reviewed C/L regimen and taking it away from food and reasons why. Patient demonstrated understanding and agreement.

## 2015-11-04 NOTE — Telephone Encounter (Addendum)
Pt called said he has questions about the directions for carbidopa-levodopa (SINEMET IR) 25-100 MG tablet . His next dose is at 12. He also has questions about the protein diet. Please call

## 2015-12-15 ENCOUNTER — Ambulatory Visit: Payer: Medicare Other | Admitting: Podiatry

## 2016-01-20 ENCOUNTER — Ambulatory Visit
Admission: RE | Admit: 2016-01-20 | Discharge: 2016-01-20 | Disposition: A | Discharge status: Home / Self Care | Payer: Medicare Other | Source: Ambulatory Visit | Attending: Neurology | Admitting: Neurology

## 2016-01-20 DIAGNOSIS — G20 Parkinson's disease: Secondary | ICD-10-CM

## 2016-01-20 DIAGNOSIS — G4752 REM sleep behavior disorder: Secondary | ICD-10-CM

## 2016-01-21 ENCOUNTER — Other Ambulatory Visit: Payer: Self-pay

## 2016-01-21 NOTE — Progress Notes (Signed)
Please call patient regarding the recent brain MRI: The brain scan showed a normal structure of the brain and mild volume loss which we call atrophy. There were changes in the deeper structures of the brain, which we call white matter changes or microvascular changes. These were reported as mild in His case. These are tiny white spots, that occur with time and are seen in a variety of conditions, including with normal aging, chronic hypertension, chronic headaches, especially migraine HAs, chronic diabetes, chronic hyperlipidemia. These are not strokes and no mass or lesion were seen which is reassuring. Again, there were no acute findings, such as a stroke, or mass or blood products. No further action is required on this test at this time, other than re-enforcing the importance of good blood pressure control, good cholesterol control, good blood sugar control, and weight management. Please remind patient to keep any upcoming appointments or tests and to call us with any interim questions, concerns, problems or updates. Thanks,  Huston FoleySaima Kariel Skillman, MD, PhD

## 2016-01-25 ENCOUNTER — Telehealth: Payer: Self-pay

## 2016-01-25 NOTE — Telephone Encounter (Signed)
I spoke to patient and gave him results and recommendations. He voiced understanding. I reminded him of up coming appt in 2 weeks.

## 2016-01-25 NOTE — Telephone Encounter (Signed)
-----   Message from Saima Athar, MD sHuston Foleyent at 01/21/2016  2:40 PM EDT ----- Please call patient regarding the recent brain MRI: The brain scan showed a normal structure of the brain and mild volume loss which we call atrophy. There were changes in the deeper structures of the brain, which we call white matter changes or microvascular changes. These were reported as mild in His case. These are tiny white spots, that occur with time and are seen in a variety of conditions, including with normal aging, chronic hypertension, chronic headaches, especially migraine HAs, chronic diabetes, chronic hyperlipidemia. These are not strokes and no mass or lesion were seen which is reassuring. Again, there were no acute findings, such as a stroke, or mass or blood products. No further action is required on this test at this time, other than re-enforcing the importance of good blood pressure control, good cholesterol control, good blood sugar control, and weight management. Please remind patient to keep any upcoming appointments or tests and to call us with any interim questions, concerns, problems or updates. Thanks,  Huston FoleySaima Athar, MD, PhD

## 2016-02-07 ENCOUNTER — Ambulatory Visit: Payer: Medicare Other | Admitting: Neurology

## 2016-02-22 ENCOUNTER — Ambulatory Visit (INDEPENDENT_AMBULATORY_CARE_PROVIDER_SITE_OTHER): Payer: Medicare Other | Admitting: Neurology

## 2016-02-22 ENCOUNTER — Encounter: Payer: Self-pay | Admitting: Neurology

## 2016-02-22 VITALS — BP 122/60 | HR 68 | Resp 16 | Ht 70.5 in | Wt 212.0 lb

## 2016-02-22 DIAGNOSIS — G20 Parkinson's disease: Secondary | ICD-10-CM | POA: Diagnosis not present

## 2016-02-22 DIAGNOSIS — G4752 REM sleep behavior disorder: Secondary | ICD-10-CM | POA: Diagnosis not present

## 2016-02-22 MED ORDER — CARBIDOPA-LEVODOPA ER 50-200 MG PO TBCR
1.0000 | EXTENDED_RELEASE_TABLET | Freq: Three times a day (TID) | ORAL | 5 refills | Status: DC
Start: 2016-02-22 — End: 2016-07-19

## 2016-02-22 NOTE — Patient Instructions (Addendum)
We will change your Sinemet to longacting: Sinemet CR: 50/200 mg, take one pill at 9 AM, 1 PM and 5 PM.
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
## 2016-02-23 ENCOUNTER — Telehealth: Payer: Self-pay | Admitting: Neurology

## 2016-02-23 NOTE — Telephone Encounter (Signed)
Do you want to change this? Or advise patient to wait for pharmacy to order shipment?

## 2016-02-23 NOTE — Telephone Encounter (Signed)
I called patient back and advised him not to double up per Dr. Frances FurbishAthar. He already feels sleepy with the medication and will be more so if he doubles it. Patient voiced understanding and will wait to start new med when delivered.

## 2016-02-23 NOTE — Telephone Encounter (Signed)
We can wait for the Sinemet CR 50/200 mg to be ordered by pharmacy. No urgency, would like to keep this Rx, not change. Please inform pt.

## 2016-02-23 NOTE — Telephone Encounter (Signed)
Patient called regarding recent increase from 25 MG to 50 MG carbidopa-levodopa (SINEMET CR) 50-200 MG tablet, pharmacy has advised patient they don't have 50 MG in stock, advised patient to check w/Dr. Frances FurbishAthar regarding taking (2) 25 MG tablets. Please call to advise.

## 2016-05-17 DIAGNOSIS — H34211 Partial retinal artery occlusion, right eye: Secondary | ICD-10-CM | POA: Insufficient documentation

## 2016-06-01 ENCOUNTER — Inpatient Hospital Stay (HOSPITAL_COMMUNITY)
Admission: EM | Admit: 2016-06-01 | Discharge: 2016-06-20 | DRG: 987 | Disposition: A | Discharge status: Rehabilitation Facility | Payer: Medicare Other | Attending: General Surgery | Admitting: General Surgery

## 2016-06-01 ENCOUNTER — Emergency Department (HOSPITAL_COMMUNITY): Payer: Medicare Other

## 2016-06-01 ENCOUNTER — Encounter (HOSPITAL_COMMUNITY): Payer: Self-pay | Admitting: *Deleted

## 2016-06-01 DIAGNOSIS — S2243XA Multiple fractures of ribs, bilateral, initial encounter for closed fracture: Secondary | ICD-10-CM | POA: Diagnosis present

## 2016-06-01 DIAGNOSIS — R0789 Other chest pain: Secondary | ICD-10-CM

## 2016-06-01 DIAGNOSIS — I714 Abdominal aortic aneurysm, without rupture, unspecified: Secondary | ICD-10-CM

## 2016-06-01 DIAGNOSIS — N183 Chronic kidney disease, stage 3 unspecified: Secondary | ICD-10-CM

## 2016-06-01 DIAGNOSIS — I7771 Dissection of carotid artery: Secondary | ICD-10-CM | POA: Diagnosis not present

## 2016-06-01 DIAGNOSIS — R4182 Altered mental status, unspecified: Secondary | ICD-10-CM

## 2016-06-01 DIAGNOSIS — S22088A Other fracture of T11-T12 vertebra, initial encounter for closed fracture: Secondary | ICD-10-CM | POA: Diagnosis present

## 2016-06-01 DIAGNOSIS — N390 Urinary tract infection, site not specified: Secondary | ICD-10-CM | POA: Diagnosis not present

## 2016-06-01 DIAGNOSIS — R0682 Tachypnea, not elsewhere classified: Secondary | ICD-10-CM

## 2016-06-01 DIAGNOSIS — S2249XA Multiple fractures of ribs, unspecified side, initial encounter for closed fracture: Secondary | ICD-10-CM

## 2016-06-01 DIAGNOSIS — Z6829 Body mass index (BMI) 29.0-29.9, adult: Secondary | ICD-10-CM

## 2016-06-01 DIAGNOSIS — Z951 Presence of aortocoronary bypass graft: Secondary | ICD-10-CM

## 2016-06-01 DIAGNOSIS — G20 Parkinson's disease: Secondary | ICD-10-CM | POA: Diagnosis present

## 2016-06-01 DIAGNOSIS — I639 Cerebral infarction, unspecified: Secondary | ICD-10-CM

## 2016-06-01 DIAGNOSIS — R062 Wheezing: Secondary | ICD-10-CM

## 2016-06-01 DIAGNOSIS — S22089A Unspecified fracture of T11-T12 vertebra, initial encounter for closed fracture: Secondary | ICD-10-CM

## 2016-06-01 DIAGNOSIS — R011 Cardiac murmur, unspecified: Secondary | ICD-10-CM | POA: Diagnosis not present

## 2016-06-01 DIAGNOSIS — R188 Other ascites: Secondary | ICD-10-CM | POA: Diagnosis not present

## 2016-06-01 DIAGNOSIS — S32009A Unspecified fracture of unspecified lumbar vertebra, initial encounter for closed fracture: Secondary | ICD-10-CM

## 2016-06-01 DIAGNOSIS — E785 Hyperlipidemia, unspecified: Secondary | ICD-10-CM | POA: Diagnosis present

## 2016-06-01 DIAGNOSIS — E669 Obesity, unspecified: Secondary | ICD-10-CM | POA: Diagnosis present

## 2016-06-01 DIAGNOSIS — R471 Dysarthria and anarthria: Secondary | ICD-10-CM | POA: Diagnosis not present

## 2016-06-01 DIAGNOSIS — S36112A Contusion of liver, initial encounter: Secondary | ICD-10-CM | POA: Diagnosis present

## 2016-06-01 DIAGNOSIS — I5041 Acute combined systolic (congestive) and diastolic (congestive) heart failure: Secondary | ICD-10-CM

## 2016-06-01 DIAGNOSIS — S32019A Unspecified fracture of first lumbar vertebra, initial encounter for closed fracture: Secondary | ICD-10-CM | POA: Diagnosis present

## 2016-06-01 DIAGNOSIS — R5383 Other fatigue: Secondary | ICD-10-CM

## 2016-06-01 DIAGNOSIS — S270XXA Traumatic pneumothorax, initial encounter: Secondary | ICD-10-CM | POA: Diagnosis not present

## 2016-06-01 DIAGNOSIS — J9600 Acute respiratory failure, unspecified whether with hypoxia or hypercapnia: Secondary | ICD-10-CM

## 2016-06-01 DIAGNOSIS — R402134 Coma scale, eyes open, to sound, 24 hours or more after hospital admission: Secondary | ICD-10-CM | POA: Diagnosis not present

## 2016-06-01 DIAGNOSIS — R319 Hematuria, unspecified: Secondary | ICD-10-CM

## 2016-06-01 DIAGNOSIS — S27321A Contusion of lung, unilateral, initial encounter: Secondary | ICD-10-CM

## 2016-06-01 DIAGNOSIS — I480 Paroxysmal atrial fibrillation: Secondary | ICD-10-CM | POA: Diagnosis not present

## 2016-06-01 DIAGNOSIS — G8194 Hemiplegia, unspecified affecting left nondominant side: Secondary | ICD-10-CM | POA: Diagnosis not present

## 2016-06-01 DIAGNOSIS — S36039A Unspecified laceration of spleen, initial encounter: Secondary | ICD-10-CM | POA: Diagnosis present

## 2016-06-01 DIAGNOSIS — R0902 Hypoxemia: Secondary | ICD-10-CM | POA: Diagnosis present

## 2016-06-01 DIAGNOSIS — I4891 Unspecified atrial fibrillation: Secondary | ICD-10-CM

## 2016-06-01 DIAGNOSIS — R402364 Coma scale, best motor response, obeys commands, 24 hours or more after hospital admission: Secondary | ICD-10-CM | POA: Diagnosis not present

## 2016-06-01 DIAGNOSIS — I509 Heart failure, unspecified: Secondary | ICD-10-CM

## 2016-06-01 DIAGNOSIS — N320 Bladder-neck obstruction: Secondary | ICD-10-CM | POA: Diagnosis not present

## 2016-06-01 DIAGNOSIS — I25810 Atherosclerosis of coronary artery bypass graft(s) without angina pectoris: Secondary | ICD-10-CM | POA: Diagnosis present

## 2016-06-01 DIAGNOSIS — G9349 Other encephalopathy: Secondary | ICD-10-CM | POA: Diagnosis not present

## 2016-06-01 DIAGNOSIS — S301XXA Contusion of abdominal wall, initial encounter: Secondary | ICD-10-CM | POA: Diagnosis present

## 2016-06-01 DIAGNOSIS — R402244 Coma scale, best verbal response, confused conversation, 24 hours or more after hospital admission: Secondary | ICD-10-CM | POA: Diagnosis not present

## 2016-06-01 DIAGNOSIS — I63411 Cerebral infarction due to embolism of right middle cerebral artery: Secondary | ICD-10-CM | POA: Diagnosis not present

## 2016-06-01 DIAGNOSIS — Z4659 Encounter for fitting and adjustment of other gastrointestinal appliance and device: Secondary | ICD-10-CM

## 2016-06-01 DIAGNOSIS — I130 Hypertensive heart and chronic kidney disease with heart failure and stage 1 through stage 4 chronic kidney disease, or unspecified chronic kidney disease: Secondary | ICD-10-CM | POA: Diagnosis present

## 2016-06-01 DIAGNOSIS — T1490XA Injury, unspecified, initial encounter: Secondary | ICD-10-CM | POA: Diagnosis not present

## 2016-06-01 DIAGNOSIS — I5033 Acute on chronic diastolic (congestive) heart failure: Secondary | ICD-10-CM | POA: Diagnosis not present

## 2016-06-01 DIAGNOSIS — I69319 Unspecified symptoms and signs involving cognitive functions following cerebral infarction: Secondary | ICD-10-CM

## 2016-06-01 DIAGNOSIS — K921 Melena: Secondary | ICD-10-CM | POA: Diagnosis not present

## 2016-06-01 DIAGNOSIS — R52 Pain, unspecified: Secondary | ICD-10-CM

## 2016-06-01 DIAGNOSIS — S27322A Contusion of lung, bilateral, initial encounter: Secondary | ICD-10-CM | POA: Diagnosis present

## 2016-06-01 DIAGNOSIS — Z0189 Encounter for other specified special examinations: Secondary | ICD-10-CM

## 2016-06-01 DIAGNOSIS — N179 Acute kidney failure, unspecified: Secondary | ICD-10-CM

## 2016-06-01 DIAGNOSIS — E870 Hyperosmolality and hypernatremia: Secondary | ICD-10-CM

## 2016-06-01 DIAGNOSIS — R29810 Facial weakness: Secondary | ICD-10-CM | POA: Diagnosis not present

## 2016-06-01 DIAGNOSIS — I6330 Cerebral infarction due to thrombosis of unspecified cerebral artery: Secondary | ICD-10-CM | POA: Insufficient documentation

## 2016-06-01 DIAGNOSIS — Y92410 Unspecified street and highway as the place of occurrence of the external cause: Secondary | ICD-10-CM

## 2016-06-01 DIAGNOSIS — T797XXA Traumatic subcutaneous emphysema, initial encounter: Secondary | ICD-10-CM | POA: Diagnosis present

## 2016-06-01 DIAGNOSIS — R29711 NIHSS score 11: Secondary | ICD-10-CM | POA: Diagnosis not present

## 2016-06-01 DIAGNOSIS — D62 Acute posthemorrhagic anemia: Secondary | ICD-10-CM | POA: Diagnosis not present

## 2016-06-01 HISTORY — DX: Fatty (change of) liver, not elsewhere classified: K76.0

## 2016-06-01 HISTORY — DX: Parkinson's disease: G20

## 2016-06-01 HISTORY — DX: Parkinsonism, unspecified: G20.C

## 2016-06-01 HISTORY — DX: Atherosclerotic heart disease of native coronary artery without angina pectoris: I25.10

## 2016-06-01 HISTORY — DX: Personal history of other diseases of the digestive system: Z87.19

## 2016-06-01 HISTORY — DX: Unspecified atrial fibrillation: I48.91

## 2016-06-01 LAB — PREPARE FRESH FROZEN PLASMA
Blood Product Expiration Date: 201801172359
Blood Product Expiration Date: 201801172359
ISSUE DATE / TIME: 201801122059
ISSUE DATE / TIME: 201801122059
UNIT TYPE AND RH: 6200
Unit Type and Rh: 6200

## 2016-06-01 LAB — COMPREHENSIVE METABOLIC PANEL
ALBUMIN: 3.6 g/dL (ref 3.5–5.0)
ALK PHOS: 79 U/L (ref 38–126)
ALT: 6 U/L — AB (ref 17–63)
AST: 50 U/L — ABNORMAL HIGH (ref 15–41)
Anion gap: 12 (ref 5–15)
BUN: 20 mg/dL (ref 6–20)
CALCIUM: 9.1 mg/dL (ref 8.9–10.3)
CHLORIDE: 104 mmol/L (ref 101–111)
CO2: 23 mmol/L (ref 22–32)
CREATININE: 1.34 mg/dL — AB (ref 0.61–1.24)
GFR calc Af Amer: 58 mL/min — ABNORMAL LOW (ref >60)
GFR calc non Af Amer: 50 mL/min — ABNORMAL LOW (ref >60)
GLUCOSE: 142 mg/dL — AB (ref 65–99)
Potassium: 4 mmol/L (ref 3.5–5.1)
SODIUM: 139 mmol/L (ref 135–145)
Total Bilirubin: 0.9 mg/dL (ref 0.3–1.2)
Total Protein: 6.3 g/dL — ABNORMAL LOW (ref 6.5–8.1)

## 2016-06-01 LAB — TYPE AND SCREEN
ABO/RH(D): O POS
ANTIBODY SCREEN: NEGATIVE
Unit division: 0
Unit division: 0

## 2016-06-01 LAB — URINALYSIS, ROUTINE W REFLEX MICROSCOPIC
BILIRUBIN URINE: NEGATIVE
GLUCOSE, UA: NEGATIVE mg/dL
KETONES UR: 5 mg/dL — AB
NITRITE: NEGATIVE
PH: 5 (ref 5.0–8.0)
PROTEIN: 30 mg/dL — AB
Specific Gravity, Urine: 1.041 — ABNORMAL HIGH (ref 1.005–1.030)

## 2016-06-01 LAB — CBC
HCT: 37.8 % — ABNORMAL LOW (ref 39.0–52.0)
Hemoglobin: 12.4 g/dL — ABNORMAL LOW (ref 13.0–17.0)
MCH: 28.8 pg (ref 26.0–34.0)
MCHC: 32.8 g/dL (ref 30.0–36.0)
MCV: 87.7 fL (ref 78.0–100.0)
PLATELETS: 283 10*3/uL (ref 150–400)
RBC: 4.31 MIL/uL (ref 4.22–5.81)
RDW: 15 % (ref 11.5–15.5)
WBC: 18 10*3/uL — ABNORMAL HIGH (ref 4.0–10.5)

## 2016-06-01 LAB — PROTIME-INR
INR: 1.19
Prothrombin Time: 15.2 seconds (ref 11.4–15.2)

## 2016-06-01 LAB — ABO/RH: ABO/RH(D): O POS

## 2016-06-01 LAB — ETHANOL

## 2016-06-01 MED ORDER — ONDANSETRON HCL 4 MG/2ML IJ SOLN
INTRAMUSCULAR | Status: AC
  Filled 2016-06-01: qty 2

## 2016-06-01 MED ORDER — FENTANYL CITRATE (PF) 100 MCG/2ML IJ SOLN
INTRAMUSCULAR | Status: AC
  Filled 2016-06-01: qty 2

## 2016-06-01 MED ORDER — IOPAMIDOL (ISOVUE-300) INJECTION 61%
INTRAVENOUS | Status: AC
  Administered 2016-06-01: 100 mL
  Filled 2016-06-01: qty 100

## 2016-06-01 MED ORDER — ONDANSETRON HCL 4 MG/2ML IJ SOLN
4.0000 mg | Freq: Once | INTRAMUSCULAR | Status: AC
  Administered 2016-06-02: 4 mg via INTRAVENOUS

## 2016-06-01 MED ORDER — MORPHINE SULFATE (PF) 4 MG/ML IV SOLN
4.0000 mg | Freq: Once | INTRAVENOUS | Status: AC
  Administered 2016-06-01: 4 mg via INTRAVENOUS

## 2016-06-01 MED ORDER — ONDANSETRON HCL 4 MG/2ML IJ SOLN
4.0000 mg | Freq: Once | INTRAMUSCULAR | Status: AC
  Administered 2016-06-01: 4 mg via INTRAVENOUS

## 2016-06-01 MED ORDER — FENTANYL CITRATE (PF) 100 MCG/2ML IJ SOLN
100.0000 ug | Freq: Once | INTRAMUSCULAR | Status: AC
  Administered 2016-06-01: 100 ug via INTRAVENOUS

## 2016-06-01 MED ORDER — IOPAMIDOL (ISOVUE-370) INJECTION 76%
INTRAVENOUS | Status: AC
  Filled 2016-06-01: qty 100

## 2016-06-01 MED ORDER — FENTANYL CITRATE (PF) 100 MCG/2ML IJ SOLN
INTRAMUSCULAR | Status: AC | PRN
  Administered 2016-06-01: 100 ug via INTRAVENOUS

## 2016-06-01 MED ORDER — MORPHINE SULFATE 2 MG/ML IJ SOLN
INTRAMUSCULAR | Status: AC | PRN
  Administered 2016-06-01: 4 mg via INTRAVENOUS
  Administered 2016-06-02: 2 mg via INTRAVENOUS

## 2016-06-01 MED ORDER — LIDOCAINE HCL (PF) 1 % IJ SOLN
10.0000 mL | Freq: Once | INTRAMUSCULAR | Status: DC
  Filled 2016-06-01: qty 10

## 2016-06-01 NOTE — H&P (Signed)
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 M 06/01/2016, 11:17 PM   Procedures
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
## 2016-06-01 NOTE — ED Notes (Signed)
Dr Andrey Campanilewilson in to see
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
## 2016-06-01 NOTE — ED Notes (Signed)
Pt returned from ct
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
## 2016-06-01 NOTE — ED Notes (Signed)
To ct

## 2016-06-01 NOTE — ED Notes (Signed)
Portable chest and pelvis 

## 2016-06-01 NOTE — ED Notes (Signed)
Fentanyl 100 mg given now delayed from earlier
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
## 2016-06-02 ENCOUNTER — Encounter (HOSPITAL_COMMUNITY): Payer: Self-pay | Admitting: *Deleted

## 2016-06-02 DIAGNOSIS — I5033 Acute on chronic diastolic (congestive) heart failure: Secondary | ICD-10-CM | POA: Diagnosis not present

## 2016-06-02 DIAGNOSIS — J90 Pleural effusion, not elsewhere classified: Secondary | ICD-10-CM | POA: Diagnosis not present

## 2016-06-02 DIAGNOSIS — G9349 Other encephalopathy: Secondary | ICD-10-CM | POA: Diagnosis not present

## 2016-06-02 DIAGNOSIS — R339 Retention of urine, unspecified: Secondary | ICD-10-CM | POA: Diagnosis not present

## 2016-06-02 DIAGNOSIS — G8194 Hemiplegia, unspecified affecting left nondominant side: Secondary | ICD-10-CM | POA: Diagnosis not present

## 2016-06-02 DIAGNOSIS — I714 Abdominal aortic aneurysm, without rupture: Secondary | ICD-10-CM | POA: Diagnosis not present

## 2016-06-02 DIAGNOSIS — I63411 Cerebral infarction due to embolism of right middle cerebral artery: Secondary | ICD-10-CM | POA: Diagnosis not present

## 2016-06-02 DIAGNOSIS — I69319 Unspecified symptoms and signs involving cognitive functions following cerebral infarction: Secondary | ICD-10-CM | POA: Diagnosis not present

## 2016-06-02 DIAGNOSIS — Z8669 Personal history of other diseases of the nervous system and sense organs: Secondary | ICD-10-CM | POA: Diagnosis not present

## 2016-06-02 DIAGNOSIS — S36039A Unspecified laceration of spleen, initial encounter: Secondary | ICD-10-CM | POA: Diagnosis present

## 2016-06-02 DIAGNOSIS — S22088D Other fracture of T11-T12 vertebra, subsequent encounter for fracture with routine healing: Secondary | ICD-10-CM | POA: Diagnosis not present

## 2016-06-02 DIAGNOSIS — I63131 Cerebral infarction due to embolism of right carotid artery: Secondary | ICD-10-CM | POA: Diagnosis not present

## 2016-06-02 DIAGNOSIS — R0789 Other chest pain: Secondary | ICD-10-CM | POA: Diagnosis not present

## 2016-06-02 DIAGNOSIS — G20 Parkinson's disease: Secondary | ICD-10-CM | POA: Diagnosis present

## 2016-06-02 DIAGNOSIS — Y92410 Unspecified street and highway as the place of occurrence of the external cause: Secondary | ICD-10-CM | POA: Diagnosis not present

## 2016-06-02 DIAGNOSIS — N183 Chronic kidney disease, stage 3 (moderate): Secondary | ICD-10-CM | POA: Diagnosis present

## 2016-06-02 DIAGNOSIS — S2243XA Multiple fractures of ribs, bilateral, initial encounter for closed fracture: Secondary | ICD-10-CM | POA: Diagnosis present

## 2016-06-02 DIAGNOSIS — S270XXA Traumatic pneumothorax, initial encounter: Secondary | ICD-10-CM | POA: Diagnosis present

## 2016-06-02 DIAGNOSIS — I130 Hypertensive heart and chronic kidney disease with heart failure and stage 1 through stage 4 chronic kidney disease, or unspecified chronic kidney disease: Secondary | ICD-10-CM | POA: Diagnosis present

## 2016-06-02 DIAGNOSIS — N390 Urinary tract infection, site not specified: Secondary | ICD-10-CM | POA: Diagnosis not present

## 2016-06-02 DIAGNOSIS — T797XXA Traumatic subcutaneous emphysema, initial encounter: Secondary | ICD-10-CM | POA: Diagnosis present

## 2016-06-02 DIAGNOSIS — S32019A Unspecified fracture of first lumbar vertebra, initial encounter for closed fracture: Secondary | ICD-10-CM | POA: Diagnosis present

## 2016-06-02 DIAGNOSIS — I6789 Other cerebrovascular disease: Secondary | ICD-10-CM | POA: Diagnosis not present

## 2016-06-02 DIAGNOSIS — I4891 Unspecified atrial fibrillation: Secondary | ICD-10-CM | POA: Diagnosis not present

## 2016-06-02 DIAGNOSIS — A419 Sepsis, unspecified organism: Secondary | ICD-10-CM | POA: Diagnosis not present

## 2016-06-02 DIAGNOSIS — I480 Paroxysmal atrial fibrillation: Secondary | ICD-10-CM | POA: Diagnosis not present

## 2016-06-02 DIAGNOSIS — R188 Other ascites: Secondary | ICD-10-CM | POA: Diagnosis not present

## 2016-06-02 DIAGNOSIS — J9600 Acute respiratory failure, unspecified whether with hypoxia or hypercapnia: Secondary | ICD-10-CM | POA: Diagnosis not present

## 2016-06-02 DIAGNOSIS — G9340 Encephalopathy, unspecified: Secondary | ICD-10-CM | POA: Diagnosis not present

## 2016-06-02 DIAGNOSIS — S22088A Other fracture of T11-T12 vertebra, initial encounter for closed fracture: Secondary | ICD-10-CM | POA: Diagnosis present

## 2016-06-02 DIAGNOSIS — S2241XS Multiple fractures of ribs, right side, sequela: Secondary | ICD-10-CM | POA: Diagnosis not present

## 2016-06-02 DIAGNOSIS — R310 Gross hematuria: Secondary | ICD-10-CM | POA: Diagnosis not present

## 2016-06-02 DIAGNOSIS — I639 Cerebral infarction, unspecified: Secondary | ICD-10-CM | POA: Diagnosis not present

## 2016-06-02 DIAGNOSIS — S27322A Contusion of lung, bilateral, initial encounter: Secondary | ICD-10-CM | POA: Diagnosis present

## 2016-06-02 DIAGNOSIS — T1490XA Injury, unspecified, initial encounter: Secondary | ICD-10-CM | POA: Diagnosis present

## 2016-06-02 DIAGNOSIS — I7771 Dissection of carotid artery: Secondary | ICD-10-CM | POA: Diagnosis not present

## 2016-06-02 DIAGNOSIS — K921 Melena: Secondary | ICD-10-CM | POA: Diagnosis not present

## 2016-06-02 DIAGNOSIS — I6521 Occlusion and stenosis of right carotid artery: Secondary | ICD-10-CM | POA: Diagnosis not present

## 2016-06-02 DIAGNOSIS — S22089S Unspecified fracture of T11-T12 vertebra, sequela: Secondary | ICD-10-CM | POA: Diagnosis not present

## 2016-06-02 DIAGNOSIS — I25810 Atherosclerosis of coronary artery bypass graft(s) without angina pectoris: Secondary | ICD-10-CM | POA: Diagnosis present

## 2016-06-02 DIAGNOSIS — N179 Acute kidney failure, unspecified: Secondary | ICD-10-CM | POA: Diagnosis not present

## 2016-06-02 DIAGNOSIS — D62 Acute posthemorrhagic anemia: Secondary | ICD-10-CM | POA: Diagnosis not present

## 2016-06-02 DIAGNOSIS — S36112A Contusion of liver, initial encounter: Secondary | ICD-10-CM | POA: Diagnosis present

## 2016-06-02 DIAGNOSIS — I6330 Cerebral infarction due to thrombosis of unspecified cerebral artery: Secondary | ICD-10-CM | POA: Diagnosis not present

## 2016-06-02 DIAGNOSIS — J189 Pneumonia, unspecified organism: Secondary | ICD-10-CM | POA: Diagnosis not present

## 2016-06-02 DIAGNOSIS — S301XXA Contusion of abdominal wall, initial encounter: Secondary | ICD-10-CM | POA: Diagnosis present

## 2016-06-02 DIAGNOSIS — I5041 Acute combined systolic (congestive) and diastolic (congestive) heart failure: Secondary | ICD-10-CM | POA: Diagnosis not present

## 2016-06-02 LAB — CBC
HCT: 35.2 % — ABNORMAL LOW (ref 39.0–52.0)
HCT: 32 % — ABNORMAL LOW (ref 39.0–52.0)
HEMATOCRIT: 37.1 % — AB (ref 39.0–52.0)
Hemoglobin: 12.2 g/dL — ABNORMAL LOW (ref 13.0–17.0)
Hemoglobin: 11.5 g/dL — ABNORMAL LOW (ref 13.0–17.0)
Hemoglobin: 10.6 g/dL — ABNORMAL LOW (ref 13.0–17.0)
MCH: 28.6 pg (ref 26.0–34.0)
MCH: 28.8 pg (ref 26.0–34.0)
MCH: 29.3 pg (ref 26.0–34.0)
MCHC: 32.9 g/dL (ref 30.0–36.0)
MCHC: 32.7 g/dL (ref 30.0–36.0)
MCHC: 33.1 g/dL (ref 30.0–36.0)
MCV: 86.9 fL (ref 78.0–100.0)
MCV: 88.2 fL (ref 78.0–100.0)
MCV: 88.4 fL (ref 78.0–100.0)
PLATELETS: 246 10*3/uL (ref 150–400)
Platelets: 284 10*3/uL (ref 150–400)
Platelets: 236 10*3/uL (ref 150–400)
RBC: 4.27 MIL/uL (ref 4.22–5.81)
RBC: 3.99 MIL/uL — AB (ref 4.22–5.81)
RBC: 3.62 MIL/uL — ABNORMAL LOW (ref 4.22–5.81)
RDW: 15 % (ref 11.5–15.5)
RDW: 15.3 % (ref 11.5–15.5)
RDW: 15.8 % — AB (ref 11.5–15.5)
WBC: 18 10*3/uL — ABNORMAL HIGH (ref 4.0–10.5)
WBC: 10.6 10*3/uL — ABNORMAL HIGH (ref 4.0–10.5)
WBC: 9.4 10*3/uL (ref 4.0–10.5)

## 2016-06-02 LAB — COMPREHENSIVE METABOLIC PANEL
ALBUMIN: 3.6 g/dL (ref 3.5–5.0)
ALT: 8 U/L — ABNORMAL LOW (ref 17–63)
AST: 57 U/L — AB (ref 15–41)
Alkaline Phosphatase: 69 U/L (ref 38–126)
Anion gap: 12 (ref 5–15)
BUN: 22 mg/dL — AB (ref 6–20)
CHLORIDE: 106 mmol/L (ref 101–111)
CO2: 20 mmol/L — ABNORMAL LOW (ref 22–32)
Calcium: 8.8 mg/dL — ABNORMAL LOW (ref 8.9–10.3)
Creatinine, Ser: 1.38 mg/dL — ABNORMAL HIGH (ref 0.61–1.24)
GFR calc Af Amer: 56 mL/min — ABNORMAL LOW (ref >60)
GFR calc non Af Amer: 48 mL/min — ABNORMAL LOW (ref >60)
Glucose, Bld: 174 mg/dL — ABNORMAL HIGH (ref 65–99)
POTASSIUM: 4.7 mmol/L (ref 3.5–5.1)
Sodium: 138 mmol/L (ref 135–145)
Total Bilirubin: 0.8 mg/dL (ref 0.3–1.2)
Total Protein: 6.2 g/dL — ABNORMAL LOW (ref 6.5–8.1)

## 2016-06-02 LAB — PROTIME-INR
INR: 1.26
PROTHROMBIN TIME: 15.9 s — AB (ref 11.4–15.2)

## 2016-06-02 LAB — APTT: APTT: 31 s (ref 24–36)

## 2016-06-02 MED ORDER — ONDANSETRON HCL 4 MG/2ML IJ SOLN
INTRAMUSCULAR | Status: AC
  Filled 2016-06-02: qty 2

## 2016-06-02 MED ORDER — MORPHINE SULFATE (PF) 2 MG/ML IV SOLN
1.0000 mg | INTRAVENOUS | Status: DC | PRN
  Administered 2016-06-02: 1 mg via INTRAVENOUS
  Administered 2016-06-02 (×2): 2 mg via INTRAVENOUS
  Administered 2016-06-02 – 2016-06-03 (×2): 1 mg via INTRAVENOUS
  Administered 2016-06-03 (×2): 2 mg via INTRAVENOUS
  Administered 2016-06-03: 1 mg via INTRAVENOUS
  Filled 2016-06-02 (×5): qty 1

## 2016-06-02 MED ORDER — ACETAMINOPHEN 325 MG PO TABS
650.0000 mg | ORAL_TABLET | Freq: Four times a day (QID) | ORAL | Status: DC
  Administered 2016-06-03 – 2016-06-05 (×10): 650 mg via ORAL
  Filled 2016-06-02 (×11): qty 2

## 2016-06-02 MED ORDER — PANTOPRAZOLE SODIUM 40 MG IV SOLR
40.0000 mg | Freq: Every day | INTRAVENOUS | Status: DC
  Administered 2016-06-02 – 2016-06-14 (×11): 40 mg via INTRAVENOUS
  Filled 2016-06-02 (×12): qty 40

## 2016-06-02 MED ORDER — ONDANSETRON HCL 4 MG PO TABS: 4.0000 mg | ORAL_TABLET | Freq: Four times a day (QID) | ORAL | Status: DC | PRN

## 2016-06-02 MED ORDER — DEXTROSE-NACL 5-0.9 % IV SOLN
INTRAVENOUS | Status: DC
  Administered 2016-06-02: 1000 mL via INTRAVENOUS
  Administered 2016-06-02 – 2016-06-07 (×5): via INTRAVENOUS

## 2016-06-02 MED ORDER — OXYCODONE HCL 5 MG PO TABS
5.0000 mg | ORAL_TABLET | ORAL | Status: DC | PRN
  Administered 2016-06-02 – 2016-06-03 (×3): 5 mg via ORAL
  Filled 2016-06-02 (×3): qty 1

## 2016-06-02 MED ORDER — ORAL CARE MOUTH RINSE
15.0000 mL | Freq: Two times a day (BID) | OROMUCOSAL | Status: DC
  Administered 2016-06-02 – 2016-06-15 (×28): 15 mL via OROMUCOSAL

## 2016-06-02 MED ORDER — DOCUSATE SODIUM 100 MG PO CAPS
100.0000 mg | ORAL_CAPSULE | Freq: Two times a day (BID) | ORAL | Status: DC
  Administered 2016-06-02 – 2016-06-03 (×3): 100 mg via ORAL
  Filled 2016-06-02 (×6): qty 1

## 2016-06-02 MED ORDER — PANTOPRAZOLE SODIUM 40 MG PO TBEC
40.0000 mg | DELAYED_RELEASE_TABLET | Freq: Every day | ORAL | Status: DC
  Administered 2016-06-04: 40 mg via ORAL
  Filled 2016-06-02: qty 1

## 2016-06-02 MED ORDER — MORPHINE SULFATE (PF) 2 MG/ML IV SOLN
INTRAVENOUS | Status: AC
  Administered 2016-06-02: 2 mg via INTRAVENOUS
  Filled 2016-06-02: qty 1

## 2016-06-02 MED ORDER — OXYCODONE HCL 5 MG PO TABS
10.0000 mg | ORAL_TABLET | ORAL | Status: DC | PRN
  Administered 2016-06-04 (×2): 10 mg via ORAL
  Filled 2016-06-02 (×2): qty 2

## 2016-06-02 MED ORDER — POTASSIUM CHLORIDE IN NACL 20-0.9 MEQ/L-% IV SOLN
INTRAVENOUS | Status: DC
  Administered 2016-06-02: 1000 mL via INTRAVENOUS
  Filled 2016-06-02: qty 1000

## 2016-06-02 MED ORDER — CARBIDOPA-LEVODOPA ER 50-200 MG PO TBCR
1.0000 | EXTENDED_RELEASE_TABLET | Freq: Three times a day (TID) | ORAL | Status: DC
  Administered 2016-06-02 – 2016-06-04 (×9): 1 via ORAL
  Filled 2016-06-02 (×10): qty 1

## 2016-06-02 MED ORDER — MORPHINE SULFATE (PF) 4 MG/ML IV SOLN
INTRAVENOUS | Status: AC
  Filled 2016-06-02: qty 1

## 2016-06-02 MED ORDER — LIDOCAINE HCL (PF) 2 % IJ SOLN
INTRAMUSCULAR | Status: AC
  Filled 2016-06-02: qty 2

## 2016-06-02 MED ORDER — ONDANSETRON HCL 4 MG/2ML IJ SOLN: 4.0000 mg | Freq: Four times a day (QID) | INTRAMUSCULAR | Status: DC | PRN

## 2016-06-02 MED ORDER — METHOCARBAMOL 500 MG PO TABS
750.0000 mg | ORAL_TABLET | Freq: Three times a day (TID) | ORAL | Status: DC | PRN
Start: 2016-06-02 — End: 2016-06-20
  Administered 2016-06-17: 750 mg via ORAL
  Filled 2016-06-02: qty 2

## 2016-06-02 NOTE — Consult Note (Signed)
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 Continue bedrest until TLSO can be fitted and applied. Mobilize with therapy once TLSO is obtained.  Gregory [name] A 06/02/2016, 12:41 AM

## 2016-06-02 NOTE — ED Notes (Signed)
Moist sponge stick to lips

## 2016-06-02 NOTE — Progress Notes (Signed)
Orthopedic Tech Progress Note Patient Details:  Emelia LoronRichard Zanetti January 08, 1941 [id number]  Patient ID: Emelia Loronichard Gonet, male   DOB: January 08, 1941, 76 y.o.   MRN: [id number]   Saul FordyceJennifer C Latrisa Hellums 06/02/2016, 8:58 AMCalled [employer] for TLSO brace.
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
## 2016-06-02 NOTE — Progress Notes (Signed)
Rehab Admissions Coordinator Note:  Patient was screened by Trish MageLogue, Dailey Buccheri M for appropriateness for an Inpatient Acute Rehab Consult.  At this time, we are recommending Inpatient Rehab consult.  Trish MageLogue, Oasis Goehring M 06/02/2016, 5:28 PM  I can be reached at [phone].
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
## 2016-06-03 DIAGNOSIS — S2243XD Multiple fractures of ribs, bilateral, subsequent encounter for fracture with routine healing: Secondary | ICD-10-CM

## 2016-06-03 DIAGNOSIS — T1490XA Injury, unspecified, initial encounter: Secondary | ICD-10-CM

## 2016-06-03 DIAGNOSIS — S36112A Contusion of liver, initial encounter: Secondary | ICD-10-CM

## 2016-06-03 DIAGNOSIS — S2243XA Multiple fractures of ribs, bilateral, initial encounter for closed fracture: Secondary | ICD-10-CM

## 2016-06-03 DIAGNOSIS — S36039D Unspecified laceration of spleen, subsequent encounter: Secondary | ICD-10-CM

## 2016-06-03 DIAGNOSIS — S22088D Other fracture of T11-T12 vertebra, subsequent encounter for fracture with routine healing: Secondary | ICD-10-CM

## 2016-06-03 DIAGNOSIS — R0789 Other chest pain: Secondary | ICD-10-CM

## 2016-06-03 DIAGNOSIS — I714 Abdominal aortic aneurysm, without rupture, unspecified: Secondary | ICD-10-CM

## 2016-06-03 DIAGNOSIS — S22089A Unspecified fracture of T11-T12 vertebra, initial encounter for closed fracture: Secondary | ICD-10-CM

## 2016-06-03 DIAGNOSIS — N183 Chronic kidney disease, stage 3 unspecified: Secondary | ICD-10-CM

## 2016-06-03 DIAGNOSIS — G20 Parkinson's disease: Secondary | ICD-10-CM

## 2016-06-03 DIAGNOSIS — S27321D Contusion of lung, unilateral, subsequent encounter: Secondary | ICD-10-CM

## 2016-06-03 DIAGNOSIS — R52 Pain, unspecified: Secondary | ICD-10-CM

## 2016-06-03 DIAGNOSIS — S36039A Unspecified laceration of spleen, initial encounter: Secondary | ICD-10-CM

## 2016-06-03 DIAGNOSIS — S36112D Contusion of liver, subsequent encounter: Secondary | ICD-10-CM

## 2016-06-03 DIAGNOSIS — I25810 Atherosclerosis of coronary artery bypass graft(s) without angina pectoris: Secondary | ICD-10-CM

## 2016-06-03 DIAGNOSIS — S27321A Contusion of lung, unilateral, initial encounter: Secondary | ICD-10-CM

## 2016-06-03 LAB — BLOOD GAS, ARTERIAL
Acid-Base Excess: 1.9 mmol/L (ref 0.0–2.0)
BICARBONATE: 25.3 mmol/L (ref 20.0–28.0)
DRAWN BY: 370221
O2 CONTENT: 2 L/min
O2 SAT: 91.7 %
PATIENT TEMPERATURE: 98.1
pCO2 arterial: 35 mmHg (ref 32.0–48.0)
pH, Arterial: 7.471 — ABNORMAL HIGH (ref 7.350–7.450)
pO2, Arterial: 57.7 mmHg — ABNORMAL LOW (ref 83.0–108.0)

## 2016-06-03 LAB — BASIC METABOLIC PANEL
ANION GAP: 8 (ref 5–15)
BUN: 25 mg/dL — ABNORMAL HIGH (ref 6–20)
CALCIUM: 8.5 mg/dL — AB (ref 8.9–10.3)
CHLORIDE: 107 mmol/L (ref 101–111)
CO2: 25 mmol/L (ref 22–32)
Creatinine, Ser: 1.14 mg/dL (ref 0.61–1.24)
GFR calc Af Amer: >60 mL/min (ref >60)
GFR calc non Af Amer: >60 mL/min (ref >60)
GLUCOSE: 130 mg/dL — AB (ref 65–99)
Potassium: 4.5 mmol/L (ref 3.5–5.1)
Sodium: 140 mmol/L (ref 135–145)

## 2016-06-03 LAB — CBC
HCT: 31 % — ABNORMAL LOW (ref 39.0–52.0)
HCT: 27.2 % — ABNORMAL LOW (ref 39.0–52.0)
HEMATOCRIT: 27.4 % — AB (ref 39.0–52.0)
HEMOGLOBIN: 9.1 g/dL — AB (ref 13.0–17.0)
HEMOGLOBIN: 8.9 g/dL — AB (ref 13.0–17.0)
Hemoglobin: 10.1 g/dL — ABNORMAL LOW (ref 13.0–17.0)
MCH: 28.9 pg (ref 26.0–34.0)
MCH: 29.3 pg (ref 26.0–34.0)
MCH: 29.1 pg (ref 26.0–34.0)
MCHC: 32.6 g/dL (ref 30.0–36.0)
MCHC: 33.2 g/dL (ref 30.0–36.0)
MCHC: 32.7 g/dL (ref 30.0–36.0)
MCV: 88.6 fL (ref 78.0–100.0)
MCV: 88.1 fL (ref 78.0–100.0)
MCV: 88.9 fL (ref 78.0–100.0)
PLATELETS: 192 10*3/uL (ref 150–400)
Platelets: 221 10*3/uL (ref 150–400)
Platelets: 211 10*3/uL (ref 150–400)
RBC: 3.5 MIL/uL — AB (ref 4.22–5.81)
RBC: 3.11 MIL/uL — AB (ref 4.22–5.81)
RBC: 3.06 MIL/uL — AB (ref 4.22–5.81)
RDW: 15.6 % — ABNORMAL HIGH (ref 11.5–15.5)
RDW: 15.7 % — ABNORMAL HIGH (ref 11.5–15.5)
RDW: 15.6 % — ABNORMAL HIGH (ref 11.5–15.5)
WBC: 10.4 10*3/uL (ref 4.0–10.5)
WBC: 10.4 10*3/uL (ref 4.0–10.5)
WBC: 11.1 10*3/uL — ABNORMAL HIGH (ref 4.0–10.5)

## 2016-06-03 LAB — URINALYSIS, MICROSCOPIC (REFLEX)

## 2016-06-03 LAB — URINALYSIS, ROUTINE W REFLEX MICROSCOPIC
Bilirubin Urine: NEGATIVE
GLUCOSE, UA: 100 mg/dL — AB
KETONES UR: NEGATIVE mg/dL
Nitrite: NEGATIVE
PH: 6 (ref 5.0–8.0)
Protein, ur: NEGATIVE mg/dL

## 2016-06-03 MED ORDER — STARCH (THICKENING) PO POWD
ORAL | Status: DC | PRN
  Filled 2016-06-03: qty 227

## 2016-06-03 NOTE — Progress Notes (Signed)
Overall stable. Back pain well controlled. Denies radiating pain numbness or weakness. Patient able to stand up at bedside yesterday.  Mobilize in a brace further. Get upright x-rays this thoracic and lumbar spine. If this stable then will continue with bracing for treatment.
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
## 2016-06-04 ENCOUNTER — Inpatient Hospital Stay (HOSPITAL_COMMUNITY): Payer: Medicare Other

## 2016-06-04 LAB — COMPREHENSIVE METABOLIC PANEL
ALT: 7 U/L — AB (ref 17–63)
AST: 48 U/L — ABNORMAL HIGH (ref 15–41)
Albumin: 2.9 g/dL — ABNORMAL LOW (ref 3.5–5.0)
Alkaline Phosphatase: 52 U/L (ref 38–126)
Anion gap: 9 (ref 5–15)
BUN: 21 mg/dL — ABNORMAL HIGH (ref 6–20)
CHLORIDE: 107 mmol/L (ref 101–111)
CO2: 24 mmol/L (ref 22–32)
CREATININE: 1.1 mg/dL (ref 0.61–1.24)
Calcium: 8.4 mg/dL — ABNORMAL LOW (ref 8.9–10.3)
GFR calc non Af Amer: >60 mL/min (ref >60)
Glucose, Bld: 112 mg/dL — ABNORMAL HIGH (ref 65–99)
POTASSIUM: 4.2 mmol/L (ref 3.5–5.1)
SODIUM: 140 mmol/L (ref 135–145)
Total Bilirubin: 1.5 mg/dL — ABNORMAL HIGH (ref 0.3–1.2)
Total Protein: 5.9 g/dL — ABNORMAL LOW (ref 6.5–8.1)

## 2016-06-04 LAB — CBC
HCT: 27.5 % — ABNORMAL LOW (ref 39.0–52.0)
Hemoglobin: 9 g/dL — ABNORMAL LOW (ref 13.0–17.0)
MCH: 29 pg (ref 26.0–34.0)
MCHC: 32.7 g/dL (ref 30.0–36.0)
MCV: 88.7 fL (ref 78.0–100.0)
PLATELETS: 226 10*3/uL (ref 150–400)
RBC: 3.1 MIL/uL — AB (ref 4.22–5.81)
RDW: 15.8 % — ABNORMAL HIGH (ref 11.5–15.5)
WBC: 12.4 10*3/uL — AB (ref 4.0–10.5)

## 2016-06-04 LAB — URINE CULTURE

## 2016-06-04 LAB — CDS SEROLOGY

## 2016-06-04 LAB — MRSA PCR SCREENING: MRSA by PCR: NEGATIVE

## 2016-06-04 MED ORDER — OXYCODONE HCL 5 MG PO TABS
5.0000 mg | ORAL_TABLET | ORAL | Status: DC | PRN
  Administered 2016-06-04: 10 mg via ORAL
  Filled 2016-06-04: qty 2

## 2016-06-04 MED ORDER — TRAMADOL HCL 50 MG PO TABS
100.0000 mg | ORAL_TABLET | Freq: Four times a day (QID) | ORAL | Status: DC
  Administered 2016-06-04 – 2016-06-05 (×4): 100 mg via ORAL
  Filled 2016-06-04 (×4): qty 2

## 2016-06-04 MED ORDER — POLYETHYLENE GLYCOL 3350 17 G PO PACK
17.0000 g | PACK | Freq: Every day | ORAL | Status: DC
  Administered 2016-06-05 – 2016-06-14 (×2): 17 g via ORAL
  Filled 2016-06-04 (×3): qty 1

## 2016-06-04 MED ORDER — MORPHINE SULFATE (PF) 2 MG/ML IV SOLN
2.0000 mg | INTRAVENOUS | Status: DC | PRN
  Administered 2016-06-09 – 2016-06-10 (×3): 2 mg via INTRAVENOUS
  Filled 2016-06-04 (×3): qty 1

## 2016-06-04 MED ORDER — IPRATROPIUM-ALBUTEROL 0.5-2.5 (3) MG/3ML IN SOLN
3.0000 mL | RESPIRATORY_TRACT | Status: DC
  Administered 2016-06-04 – 2016-06-07 (×17): 3 mL via RESPIRATORY_TRACT
  Filled 2016-06-04 (×15): qty 3
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
## 2016-06-04 NOTE — Progress Notes (Signed)
I met with pt and two daughters and a friend at bedside. Pt up in chair but drowsy. Briefly discussed an inpt rehab admission pending insurance approval. I will follow up tomorrow. [phone]
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
## 2016-06-05 ENCOUNTER — Inpatient Hospital Stay (HOSPITAL_COMMUNITY): Payer: Medicare Other

## 2016-06-05 LAB — CBC WITH DIFFERENTIAL/PLATELET
Basophils Absolute: 0 10*3/uL (ref 0.0–0.1)
Basophils Relative: 0 %
EOS ABS: 0.2 10*3/uL (ref 0.0–0.7)
Eosinophils Relative: 2 %
HEMATOCRIT: 25.4 % — AB (ref 39.0–52.0)
HEMOGLOBIN: 8.3 g/dL — AB (ref 13.0–17.0)
LYMPHS ABS: 0.9 10*3/uL (ref 0.7–4.0)
Lymphocytes Relative: 9 %
MCH: 29.1 pg (ref 26.0–34.0)
MCHC: 32.7 g/dL (ref 30.0–36.0)
MCV: 89.1 fL (ref 78.0–100.0)
MONOS PCT: 8 %
Monocytes Absolute: 0.8 10*3/uL (ref 0.1–1.0)
NEUTROS PCT: 81 %
Neutro Abs: 8.3 10*3/uL — ABNORMAL HIGH (ref 1.7–7.7)
Platelets: 250 10*3/uL (ref 150–400)
RBC: 2.85 MIL/uL — ABNORMAL LOW (ref 4.22–5.81)
RDW: 16.2 % — ABNORMAL HIGH (ref 11.5–15.5)
WBC: 10.3 10*3/uL (ref 4.0–10.5)

## 2016-06-05 LAB — POCT I-STAT 3, ART BLOOD GAS (G3+)
ACID-BASE EXCESS: 1 mmol/L (ref 0.0–2.0)
Bicarbonate: 23.9 mmol/L (ref 20.0–28.0)
O2 SAT: 95 %
PH ART: 7.498 — AB (ref 7.350–7.450)
PO2 ART: 66 mmHg — AB (ref 83.0–108.0)
Patient temperature: 98.6
TCO2: 25 mmol/L (ref 0–100)
pCO2 arterial: 30.8 mmHg — ABNORMAL LOW (ref 32.0–48.0)

## 2016-06-05 LAB — URINALYSIS, ROUTINE W REFLEX MICROSCOPIC
BILIRUBIN URINE: NEGATIVE
Glucose, UA: NEGATIVE mg/dL
Hgb urine dipstick: NEGATIVE
Ketones, ur: 5 mg/dL — AB
LEUKOCYTES UA: NEGATIVE
Nitrite: POSITIVE — AB
PH: 5 (ref 5.0–8.0)
Protein, ur: 30 mg/dL — AB
SPECIFIC GRAVITY, URINE: 1.029 (ref 1.005–1.030)
SQUAMOUS EPITHELIAL / LPF: NONE SEEN

## 2016-06-05 MED ORDER — CIPROFLOXACIN IN D5W 400 MG/200ML IV SOLN
400.0000 mg | Freq: Two times a day (BID) | INTRAVENOUS | Status: AC
  Administered 2016-06-05 – 2016-06-09 (×10): 400 mg via INTRAVENOUS
  Filled 2016-06-05 (×10): qty 200

## 2016-06-05 MED ORDER — CARBIDOPA-LEVODOPA 25-100 MG PO TABS
1.5000 | ORAL_TABLET | Freq: Four times a day (QID) | ORAL | Status: DC
  Administered 2016-06-05 – 2016-06-20 (×52): 1.5 via ORAL
  Filled 2016-06-05 (×5): qty 2
  Filled 2016-06-05: qty 1
  Filled 2016-06-05 (×7): qty 2
  Filled 2016-06-05 (×2): qty 1
  Filled 2016-06-05 (×4): qty 2
  Filled 2016-06-05: qty 1
  Filled 2016-06-05 (×13): qty 2
  Filled 2016-06-05: qty 1
  Filled 2016-06-05 (×5): qty 2
  Filled 2016-06-05: qty 1
  Filled 2016-06-05 (×3): qty 2
  Filled 2016-06-05: qty 1
  Filled 2016-06-05 (×8): qty 2
  Filled 2016-06-05: qty 1
  Filled 2016-06-05 (×3): qty 2

## 2016-06-05 MED ORDER — DOCUSATE SODIUM 50 MG/5ML PO LIQD
100.0000 mg | Freq: Two times a day (BID) | ORAL | Status: DC
  Administered 2016-06-05 – 2016-06-19 (×20): 100 mg via ORAL
  Filled 2016-06-05 (×21): qty 10
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
## 2016-06-05 NOTE — Progress Notes (Signed)
Patient more lethargic and sedated today. Opens eyes to sternal rub says a few words. Was able to transfer from bed to chair. Was able to bear weight in his lower extremities. Patient complains of multifocal pain pedicle and chest wall and back.  Overall stable. Continue TLSO and efforts at mobilization.
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
## 2016-06-05 NOTE — Progress Notes (Signed)
I met with [place] and Olivia Mackie, daughters at bedside. I await further progress with therapies before attempting approval for an inpt rehab admission with pt's insurance. I have encouraged family members to assist pt with incentive spirometry throughout the day. [phone]
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
## 2016-06-06 MED ORDER — TRAMADOL HCL 50 MG PO TABS
50.0000 mg | ORAL_TABLET | Freq: Four times a day (QID) | ORAL | Status: DC | PRN
  Administered 2016-06-08 – 2016-06-10 (×3): 100 mg via ORAL
  Filled 2016-06-06 (×3): qty 2
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
## 2016-06-06 NOTE — Progress Notes (Signed)
Patient more alert and somewhat more appropriately at this morning. States back pain is controlled. Denies lower extremity pain.  Motor function of lower extremities appears intact. Good sensation bilaterally.  Still would like to get upright x-rays when patient unable to tolerate.
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
## 2016-06-07 ENCOUNTER — Inpatient Hospital Stay (HOSPITAL_COMMUNITY): Payer: Medicare Other

## 2016-06-07 DIAGNOSIS — I6330 Cerebral infarction due to thrombosis of unspecified cerebral artery: Secondary | ICD-10-CM | POA: Insufficient documentation

## 2016-06-07 DIAGNOSIS — I639 Cerebral infarction, unspecified: Secondary | ICD-10-CM

## 2016-06-07 LAB — MAGNESIUM
Magnesium: 2.2 mg/dL (ref 1.7–2.4)
Magnesium: 2.3 mg/dL (ref 1.7–2.4)

## 2016-06-07 LAB — BASIC METABOLIC PANEL
Anion gap: 8 (ref 5–15)
BUN: 17 mg/dL (ref 6–20)
CALCIUM: 8.4 mg/dL — AB (ref 8.9–10.3)
CO2: 24 mmol/L (ref 22–32)
CREATININE: 1.04 mg/dL (ref 0.61–1.24)
Chloride: 112 mmol/L — ABNORMAL HIGH (ref 101–111)
GLUCOSE: 103 mg/dL — AB (ref 65–99)
Potassium: 3.7 mmol/L (ref 3.5–5.1)
Sodium: 144 mmol/L (ref 135–145)

## 2016-06-07 LAB — CBC WITH DIFFERENTIAL/PLATELET
BASOS ABS: 0 10*3/uL (ref 0.0–0.1)
BASOS PCT: 0 %
EOS PCT: 2 %
Eosinophils Absolute: 0.2 10*3/uL (ref 0.0–0.7)
HCT: 26 % — ABNORMAL LOW (ref 39.0–52.0)
Hemoglobin: 8.3 g/dL — ABNORMAL LOW (ref 13.0–17.0)
Lymphocytes Relative: 8 %
Lymphs Abs: 0.8 10*3/uL (ref 0.7–4.0)
MCH: 29 pg (ref 26.0–34.0)
MCHC: 31.9 g/dL (ref 30.0–36.0)
MCV: 90.9 fL (ref 78.0–100.0)
MONO ABS: 0.9 10*3/uL (ref 0.1–1.0)
MONOS PCT: 9 %
NEUTROS ABS: 7.6 10*3/uL (ref 1.7–7.7)
Neutrophils Relative %: 81 %
PLATELETS: 262 10*3/uL (ref 150–400)
RBC: 2.86 MIL/uL — ABNORMAL LOW (ref 4.22–5.81)
RDW: 16.3 % — AB (ref 11.5–15.5)
WBC: 9.4 10*3/uL (ref 4.0–10.5)

## 2016-06-07 LAB — GLUCOSE, CAPILLARY
GLUCOSE-CAPILLARY: 68 mg/dL (ref 65–99)
Glucose-Capillary: 86 mg/dL (ref 65–99)

## 2016-06-07 LAB — URINE CULTURE

## 2016-06-07 LAB — PHOSPHORUS
PHOSPHORUS: 3 mg/dL (ref 2.5–4.6)
Phosphorus: 3 mg/dL (ref 2.5–4.6)

## 2016-06-07 MED ORDER — ALBUTEROL SULFATE (2.5 MG/3ML) 0.083% IN NEBU
2.5000 mg | INHALATION_SOLUTION | RESPIRATORY_TRACT | Status: DC | PRN
  Administered 2016-06-09: 2.5 mg via RESPIRATORY_TRACT
  Filled 2016-06-07: qty 3

## 2016-06-07 MED ORDER — PRO-STAT SUGAR FREE PO LIQD
30.0000 mL | Freq: Two times a day (BID) | ORAL | Status: AC
  Administered 2016-06-07 – 2016-06-08 (×2): 30 mL
  Filled 2016-06-07 (×2): qty 30

## 2016-06-07 MED ORDER — SODIUM CHLORIDE 0.9 % IV SOLN
INTRAVENOUS | Status: DC
  Administered 2016-06-07 – 2016-06-15 (×11): via INTRAVENOUS
  Filled 2016-06-07 (×18): qty 1000

## 2016-06-07 MED ORDER — DEXTROSE 50 % IV SOLN
INTRAVENOUS | Status: AC
  Administered 2016-06-07: 50 mL
  Filled 2016-06-07: qty 50

## 2016-06-07 MED ORDER — PRO-STAT SUGAR FREE PO LIQD: 30.0000 mL | Freq: Two times a day (BID) | ORAL | Status: DC

## 2016-06-07 MED ORDER — STROKE: EARLY STAGES OF RECOVERY BOOK
Freq: Once | Status: AC
  Administered 2016-06-07: 20:00:00
  Filled 2016-06-07: qty 1

## 2016-06-07 MED ORDER — PIVOT 1.5 CAL PO LIQD
1000.0000 mL | ORAL | Status: DC
  Administered 2016-06-09 – 2016-06-14 (×6): 1000 mL
  Filled 2016-06-07 (×8): qty 1000

## 2016-06-07 MED ORDER — PIVOT 1.5 CAL PO LIQD: 1000.0000 mL | ORAL | Status: DC

## 2016-06-07 MED ORDER — IOPAMIDOL (ISOVUE-370) INJECTION 76%
INTRAVENOUS | Status: AC
  Administered 2016-06-07: 100 mL
  Filled 2016-06-07: qty 100

## 2016-06-07 MED ORDER — IPRATROPIUM-ALBUTEROL 0.5-2.5 (3) MG/3ML IN SOLN
3.0000 mL | Freq: Two times a day (BID) | RESPIRATORY_TRACT | Status: DC
  Administered 2016-06-07 – 2016-06-10 (×6): 3 mL via RESPIRATORY_TRACT
  Filled 2016-06-07 (×6): qty 3

## 2016-06-07 MED ORDER — ASPIRIN 300 MG RE SUPP
300.0000 mg | Freq: Every day | RECTAL | Status: DC
Start: 2016-06-07 — End: 2016-06-14
  Administered 2016-06-07 – 2016-06-13 (×7): 300 mg via RECTAL
  Filled 2016-06-07 (×8): qty 1

## 2016-06-07 NOTE — Progress Notes (Signed)
Patient ID: Gregory [name], male   DOB: 11-08-1940, 76 y.o.   MRN: [id number] CT head shows acute strokes. Code stroke called by nursing. [name], MD, MPH, FACS Trauma: [phone] General Surgery: [phone]
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
## 2016-06-07 NOTE — Procedures (Signed)
History: 76 yo M with AMS, stroke  Sedation: noen  Technique: This is a 21 channel routine scalp EEG performed at the bedside with bipolar and monopolar montages arranged in accordance to the international 10/20 system of electrode placement. One channel was dedicated to EKG recording.    Background: There is a posterior dominant rhythm of 7-8 Hz that is moderately well organized. This is better seen on the left than right. In addition there is generalized diffuse irregular   Photic stimulation: Physiologic driving is not performed  EEG Abnormalities: 1) Mild attenuation of faster frequencies including PDR on the right 2) generalized irregular slow activity  Clinical Interpretation: This EEG is consistent with a focal right hemispheric cerebral dysfunction in the setting of a  generalized non-specific cerebral dysfunction(encephalopathy). There was no seizure or seizure predisposition recorded on this study.   Ritta SlotMcNeill Kirkpatrick, MD [facility] [phone]  If 7pm- 7am, please page neurology on call as listed in AMION.

## 2016-06-07 NOTE — Progress Notes (Signed)
Radiology called with CT results showing new acute right sided infarcts.  Patient's primary doctor, Dr. Violeta GelinasBurke Thompson with Trauma, was notified.  Called neurohospitalist, Dr. Benedict NeedyNath, to notify of patient's status.  Dr Benedict NeedyNath will come to bedside assess. We will continue to monitor patient closely. Roald Lukacs C 11:09 AM

## 2016-06-07 NOTE — Progress Notes (Signed)
Patient ID: Gregory [name], male   DOB: May 07, 1941, 76 y.o.   MRN: [id number] I called his son, [name], and updated him on the new CVAs and plan of care.  Violeta GelinasBurke Boniface Goffe, MD, MPH, FACS Trauma: [phone] General Surgery: [phone]
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
## 2016-06-07 NOTE — Progress Notes (Signed)
Pt with new multiple Rt sided infarcts noted on CT.  Stroke workup in progress.  Will continue to follow.    Quintella BatonJulie W. Rontavious Albright, RN, BSN  Trauma/Neuro ICU Case Manager [phone]

## 2016-06-07 NOTE — Progress Notes (Signed)
No significant change in status. Patient continues to be somewhat encephalopathic. He will follow commands. He is moving all extremities. His main complaint continues that of rib and chest wall pain.  Continue current efforts at mobilization. I would still like to get upright x-rays in brace when patient able to tolerate.

## 2016-06-07 NOTE — Progress Notes (Signed)
EEG completed, results pending. 

## 2016-06-07 NOTE — Progress Notes (Signed)
Noted new dx CVA. I will continue follow. [phone]

## 2016-06-08 ENCOUNTER — Inpatient Hospital Stay (HOSPITAL_COMMUNITY): Payer: Medicare Other

## 2016-06-08 ENCOUNTER — Other Ambulatory Visit: Payer: Self-pay | Admitting: *Deleted

## 2016-06-08 DIAGNOSIS — I6789 Other cerebrovascular disease: Secondary | ICD-10-CM

## 2016-06-08 DIAGNOSIS — G20 Parkinson's disease: Secondary | ICD-10-CM

## 2016-06-08 DIAGNOSIS — I6521 Occlusion and stenosis of right carotid artery: Secondary | ICD-10-CM

## 2016-06-08 DIAGNOSIS — I25810 Atherosclerosis of coronary artery bypass graft(s) without angina pectoris: Secondary | ICD-10-CM

## 2016-06-08 DIAGNOSIS — I63231 Cerebral infarction due to unspecified occlusion or stenosis of right carotid arteries: Secondary | ICD-10-CM

## 2016-06-08 DIAGNOSIS — I714 Abdominal aortic aneurysm, without rupture: Secondary | ICD-10-CM

## 2016-06-08 DIAGNOSIS — I481 Persistent atrial fibrillation: Secondary | ICD-10-CM

## 2016-06-08 DIAGNOSIS — N183 Chronic kidney disease, stage 3 (moderate): Secondary | ICD-10-CM

## 2016-06-08 DIAGNOSIS — I6523 Occlusion and stenosis of bilateral carotid arteries: Secondary | ICD-10-CM

## 2016-06-08 LAB — PHOSPHORUS
Phosphorus: 2.1 mg/dL — ABNORMAL LOW (ref 2.5–4.6)
Phosphorus: 2.7 mg/dL (ref 2.5–4.6)

## 2016-06-08 LAB — LIPID PANEL
Cholesterol: 105 mg/dL (ref 0–200)
HDL: 24 mg/dL — ABNORMAL LOW (ref >40)
LDL CALC: 67 mg/dL (ref 0–99)
TRIGLYCERIDES: 70 mg/dL (ref <150)
Total CHOL/HDL Ratio: 4.4 RATIO
VLDL: 14 mg/dL (ref 0–40)

## 2016-06-08 LAB — GLUCOSE, CAPILLARY
GLUCOSE-CAPILLARY: 102 mg/dL — AB (ref 65–99)
GLUCOSE-CAPILLARY: 116 mg/dL — AB (ref 65–99)
GLUCOSE-CAPILLARY: 76 mg/dL (ref 65–99)
GLUCOSE-CAPILLARY: 89 mg/dL (ref 65–99)
Glucose-Capillary: 117 mg/dL — ABNORMAL HIGH (ref 65–99)
Glucose-Capillary: 122 mg/dL — ABNORMAL HIGH (ref 65–99)
Glucose-Capillary: 121 mg/dL — ABNORMAL HIGH (ref 65–99)

## 2016-06-08 LAB — POCT I-STAT 3, ART BLOOD GAS (G3+)
Acid-Base Excess: 1 mmol/L (ref 0.0–2.0)
BICARBONATE: 23.4 mmol/L (ref 20.0–28.0)
O2 SAT: 96 %
TCO2: 24 mmol/L (ref 0–100)
pCO2 arterial: 27.1 mmHg — ABNORMAL LOW (ref 32.0–48.0)
pH, Arterial: 7.544 — ABNORMAL HIGH (ref 7.350–7.450)
pO2, Arterial: 67 mmHg — ABNORMAL LOW (ref 83.0–108.0)

## 2016-06-08 LAB — ECHOCARDIOGRAM COMPLETE
Height: 70 in
Weight: 3467.39 oz

## 2016-06-08 LAB — MAGNESIUM
MAGNESIUM: 2.1 mg/dL (ref 1.7–2.4)
MAGNESIUM: 2.2 mg/dL (ref 1.7–2.4)

## 2016-06-08 MED ORDER — PERFLUTREN LIPID MICROSPHERE
INTRAVENOUS | Status: AC
  Administered 2016-06-08: 2 mL
  Filled 2016-06-08: qty 10
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
## 2016-06-08 NOTE — Progress Notes (Signed)
  Echocardiogram 2D Echocardiogram with Definity has been performed.  Gregory [name], Gregory [name] 06/08/2016, 9:16 AM

## 2016-06-08 NOTE — Progress Notes (Signed)
Trauma Service Note  Subjective: Patient doing okay.  Much more alert today than yesterday.  Able to follow commands and talk.  Family present and their questions have been answered.  Will go speak with the aife directly later today.  Objective: Vital signs in last 24 hours: Temp:  [98 F (36.7 C)-98.9 F (37.2 C)] 98.8 F (37.1 C) (01/19 0800) Pulse Rate:  [31-116] 78 (01/19 0800) Resp:  [0-29] 27 (01/19 0800) BP: (102-151)/(53-89) 105/79 (01/19 0800) SpO2:  [97 %-100 %] 100 % (01/19 0858) Weight:  [98.3 kg (216 lb 11.4 oz)] 98.3 kg (216 lb 11.4 oz) (01/19 0409) Last BM Date:  (pta)  Intake/Output from previous day: 01/18 0701 - 01/19 0700 In: 2856.3 [I.V.:1856.3; NG/GT:600; IV Piggyback:400] Out: 625 [Urine:625] Intake/Output this shift: Total I/O In: -  Out: 220 [Urine:220]  General: No acute distress.  Breathing somewhat heavily, but maintain his oxygen saturations.  Has not had a CXr for several  days  Lungs: Clear  Abd: Soft, haas feeding tube in place.  Extremities: No changes  Neuro: Seems better today and talkative.  Following commands.    Lab Results: CBC   Recent Labs  06/05/16 1021 06/07/16 0329  WBC 10.3 9.4  HGB 8.3* 8.3*  HCT 25.4* 26.0*  PLT 250 262   BMET  Recent Labs  06/07/16 0329  NA 144  K 3.7  CL 112*  CO2 24  GLUCOSE 103*  BUN 17  CREATININE 1.04  CALCIUM 8.4*   PT/INR No results for input(s): LABPROT, INR in the last 72 hours. ABG  Recent Labs  06/05/16 1015  PHART 7.498*  HCO3 23.9    Studies/Results: Ct Angio Head W/cm &/or Wo Cm  Result Date: 06/08/2016 CLINICAL DATA:  76 y/o  M; stroke. EXAM: CT ANGIOGRAPHY HEAD AND NECK TECHNIQUE: Multidetector CT imaging of the head and neck was performed using the standard protocol during bolus administration of intravenous contrast. Multiplanar CT image reconstructions and MIPs were obtained to evaluate the vascular anatomy. Carotid stenosis measurements (when applicable) are  obtained utilizing NASCET criteria, using the distal internal carotid diameter as the denominator. CONTRAST:  100 cc Isovue 370 COMPARISON:  06/07/2016 CT of the head. FINDINGS: CTA NECK FINDINGS Aortic arch: Bovine arch, normal variant. Mild aortic atherosclerosis with mixed plaque. No significant stenosis of great vessel origins. 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 Left carotid system: Predominantly fibrofatty plaque of the carotid bifurcation with moderate 50% stenosis of the distal common carotid artery just proximal to bifurcation. No significant stenosis of the internal carotid artery. Vertebral arteries: Codominant. No evidence of dissection, stenosis (50% or greater) or occlusion. Skeleton: Mild cervical spondylosis. No high-grade bony canal stenosis. Other neck: Negative. Upper chest: Right upper lobe consolidation and pleural effusions moderate on the right and trace on the left. Review of the MIP images confirms the above findings CTA HEAD FINDINGS Anterior circulation: Diminished caliber of the right internal carotid artery relative to the left is likely due to severe proximal right ICA stenosis. Extensive calcified plaque of the cavernous and paraclinoid internal carotid artery is with mild bilateral paraclinoid stenosis. Bilateral middle cerebral arteries and anterior cerebral arteries are patent without evidence of large vessel occlusion, aneurysm, or significant stenosis. Posterior circulation: No significant stenosis, proximal occlusion, aneurysm, or vascular malformation. Venous sinuses: As permitted by contrast timing, patent. Anatomic variants: Small patent anterior communicating artery  and bilateral posterior communicating arteries. Delayed phase: No abnormal intracranial enhancement. Review of the MIP images confirms the above findings IMPRESSION: 1. Severe,  greater than 95%, thread-like stenosis of the proximal right internal carotid artery secondary to a predominantly fibrofatty plaque. 2. Moderate 50% stenosis of left common carotid artery just proximal to bifurcation secondary to predominantly fibrofatty plaque. 3. Diminished caliber of right ICA likely due to severe proximal right ICA stenosis. 4. No large vessel occlusion, high-grade stenosis, aneurysm, or vascular malformation of the circle of Willis. 5. Intracranial atherosclerosis with areas of mild vessel irregularity and stenosis as well as calcified plaque of the internal carotid arteries. 6. Right upper lobe consolidation may be related to pneumonia or pulmonary edema. Moderate right and trace left pleural effusions. Electronically Signed   By: Mitzi Hansen M.D.   On: 06/08/2016 00:55   Ct Head Wo Contrast  Result Date: 06/07/2016 CLINICAL DATA:  Mental status change.  Increased lethargy. EXAM: CT HEAD WITHOUT CONTRAST TECHNIQUE: Contiguous axial images were obtained from the base of the skull through the vertex without intravenous contrast. COMPARISON:  Six days ago FINDINGS: Brain: Interval, acute infarcts in the anterior right thalamus and genu of internal capsule. 1 cm infarct in the right periatrial white matter. No acute hemorrhage, hydrocephalus, or shift. Cerebral volume loss with secondary ventriculomegaly. Vascular: No visible hyperdense vessel. Atherosclerotic calcifications. Skull: No acute finding. Sinuses/Orbits: Mastoidectomy on the left with predominantly clear bowl. Bilateral cataract resection. Other: These results will be called to the ordering clinician or representative by the Radiologist Assistant, and communication documented in the PACS or zVision Dashboard. 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 Electronically Signed   By: Marnee Spring M.D.   On: 06/07/2016 10:49   Ct Angio Neck W/cm &/or  Wo/cm  Result Date: 06/08/2016 CLINICAL DATA:  76 y/o  M; stroke. EXAM: CT ANGIOGRAPHY HEAD AND NECK TECHNIQUE: Multidetector CT imaging of the head and neck was performed using the standard protocol during bolus administration of intravenous contrast. Multiplanar CT image reconstructions and MIPs were obtained to evaluate the vascular anatomy. Carotid stenosis measurements (when applicable) are obtained utilizing NASCET criteria, using the distal internal carotid diameter as the denominator. CONTRAST:  100 cc Isovue 370 COMPARISON:  06/07/2016 CT of the head. FINDINGS: CTA NECK FINDINGS Aortic arch: Bovine arch, normal variant. Mild aortic atherosclerosis with mixed plaque. No significant stenosis of great vessel origins. 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 Left carotid system: Predominantly fibrofatty plaque of the carotid bifurcation with moderate 50% stenosis of the distal common carotid artery just proximal to bifurcation. No significant stenosis of the internal carotid artery. Vertebral arteries: Codominant. No evidence of dissection, stenosis (50% or greater) or occlusion. Skeleton: Mild cervical spondylosis. No high-grade bony canal stenosis. Other neck: Negative. Upper chest: Right upper lobe consolidation and pleural effusions moderate on the right and trace on the left. Review of the MIP images confirms the above findings CTA HEAD FINDINGS Anterior circulation: Diminished caliber of the right internal carotid artery relative to the left is likely due to severe proximal right ICA stenosis. Extensive calcified plaque of the cavernous and paraclinoid internal carotid artery is with mild bilateral paraclinoid stenosis. Bilateral middle cerebral arteries and anterior cerebral arteries are patent without evidence of large vessel occlusion, aneurysm, or significant  stenosis. Posterior circulation: No significant stenosis,  proximal occlusion, aneurysm, or vascular malformation. Venous sinuses: As permitted by contrast timing, patent. Anatomic variants: Small patent anterior communicating artery and bilateral posterior communicating arteries. Delayed phase: No abnormal intracranial enhancement. Review of the MIP images confirms the above findings IMPRESSION: 1. 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 2. Moderate 50% stenosis of left common carotid artery just proximal to bifurcation secondary to predominantly fibrofatty plaque. 3. Diminished caliber of right ICA likely due to severe proximal right ICA stenosis. 4. No large vessel occlusion, high-grade stenosis, aneurysm, or vascular malformation of the circle of Willis. 5. Intracranial atherosclerosis with areas of mild vessel irregularity and stenosis as well as calcified plaque of the internal carotid arteries. 6. Right upper lobe consolidation may be related to pneumonia or pulmonary edema. Moderate right and trace left pleural effusions. Electronically Signed   By: Mitzi HansenLance  Furusawa-Stratton M.D.   On: 06/08/2016 00:55   Dg Abd Portable 1v  Result Date: 06/07/2016 CLINICAL DATA:  Evaluate feeding tube placement. EXAM: PORTABLE ABDOMEN - 1 VIEW COMPARISON:  None. FINDINGS: The distal tip of the feeding tube is near the junction of the pylorus and duodenal bulb. IMPRESSION: The distal tip of the feeding tube is near the junction of the pylorus and duodenal bulb. Electronically Signed   By: Gerome Samavid  Williams III M.D   On: 06/07/2016 18:40    Anti-infectives: Anti-infectives    Start     Dose/Rate Route Frequency Ordered Stop   06/05/16 1200  ciprofloxacin (CIPRO) IVPB 400 mg     400 mg 200 mL/hr over 60 Minutes Intravenous Every 12 hours 06/05/16 1052 06/10/16 1159      Assessment/Plan: s/p  Swallowing evaluation   CXR COntinue with PT/OT In atrial fibrillation with rate controlled, but not anticoagulated outside of ASA Vascular surgery consultation.  LOS: 6 days   Marta LamasJames O. Gae BonWyatt, III, MD, FACS [phone] Trauma Surgeon 06/08/2016
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
## 2016-06-08 NOTE — Progress Notes (Signed)
I met with pt's daughter, [place] and her husband at bedside, as well as Dr. Hulen Skains. Informed Dr. Hulen Skains and RN, Marlowe Kays, that pt's wife is requesting Trauma MD to meet with her in her room 5N31 for which she is a patient. She requests updates on her husband's condition. I have updated RN CM, Almyra Free. [phone]
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
## 2016-06-09 ENCOUNTER — Inpatient Hospital Stay (HOSPITAL_COMMUNITY): Payer: Medicare Other

## 2016-06-09 DIAGNOSIS — I6330 Cerebral infarction due to thrombosis of unspecified cerebral artery: Secondary | ICD-10-CM

## 2016-06-09 LAB — BASIC METABOLIC PANEL
Anion gap: 7 (ref 5–15)
BUN: 29 mg/dL — AB (ref 6–20)
CHLORIDE: 113 mmol/L — AB (ref 101–111)
CO2: 22 mmol/L (ref 22–32)
CREATININE: 0.96 mg/dL (ref 0.61–1.24)
Calcium: 8.4 mg/dL — ABNORMAL LOW (ref 8.9–10.3)
Glucose, Bld: 103 mg/dL — ABNORMAL HIGH (ref 65–99)
POTASSIUM: 4.3 mmol/L (ref 3.5–5.1)
SODIUM: 142 mmol/L (ref 135–145)

## 2016-06-09 LAB — CBC WITH DIFFERENTIAL/PLATELET
BASOS PCT: 0 %
Basophils Absolute: 0 10*3/uL (ref 0.0–0.1)
EOS ABS: 0.3 10*3/uL (ref 0.0–0.7)
EOS PCT: 3 %
HCT: 29.7 % — ABNORMAL LOW (ref 39.0–52.0)
Hemoglobin: 9.4 g/dL — ABNORMAL LOW (ref 13.0–17.0)
LYMPHS ABS: 1.3 10*3/uL (ref 0.7–4.0)
Lymphocytes Relative: 13 %
MCH: 28.7 pg (ref 26.0–34.0)
MCHC: 31.6 g/dL (ref 30.0–36.0)
MCV: 90.5 fL (ref 78.0–100.0)
Monocytes Absolute: 0.7 10*3/uL (ref 0.1–1.0)
Monocytes Relative: 7 %
NEUTROS PCT: 76 %
Neutro Abs: 7.6 10*3/uL (ref 1.7–7.7)
PLATELETS: 277 10*3/uL (ref 150–400)
RBC: 3.28 MIL/uL — AB (ref 4.22–5.81)
RDW: 16.6 % — ABNORMAL HIGH (ref 11.5–15.5)
WBC: 10.5 10*3/uL (ref 4.0–10.5)

## 2016-06-09 LAB — BLOOD GAS, ARTERIAL
ACID-BASE EXCESS: 0.4 mmol/L (ref 0.0–2.0)
BICARBONATE: 23.7 mmol/L (ref 20.0–28.0)
Drawn by: 406621
O2 Content: 21 L/min
O2 Saturation: 93.1 %
PCO2 ART: 33.4 mmHg (ref 32.0–48.0)
PH ART: 7.466 — AB (ref 7.350–7.450)
Patient temperature: 98.6
pO2, Arterial: 64.3 mmHg — ABNORMAL LOW (ref 83.0–108.0)

## 2016-06-09 LAB — GLUCOSE, CAPILLARY
GLUCOSE-CAPILLARY: 98 mg/dL (ref 65–99)
Glucose-Capillary: 128 mg/dL — ABNORMAL HIGH (ref 65–99)
Glucose-Capillary: 144 mg/dL — ABNORMAL HIGH (ref 65–99)
Glucose-Capillary: 114 mg/dL — ABNORMAL HIGH (ref 65–99)
Glucose-Capillary: 98 mg/dL (ref 65–99)

## 2016-06-09 LAB — HEMOGLOBIN A1C
HEMOGLOBIN A1C: 4.8 % (ref 4.8–5.6)
MEAN PLASMA GLUCOSE: 91 mg/dL

## 2016-06-09 NOTE — Progress Notes (Signed)
EEG Completed; Results Pending  
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
## 2016-06-09 NOTE — Plan of Care (Signed)
Problem: Nutrition: Goal: Dietary intake will improve Outcome: Not Progressing Unable to swallow at this time.  Tolerating tf   Problem: Tissue Perfusion: Goal: Complications of Ischemic Stroke will be minimized (choose ONE based on patient diagnosis) Outcome: Progressing Pt still lethargic.  Unable to participate fully with exam and education.  Daughter educated on risk factors.
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
## 2016-06-10 LAB — URINALYSIS, ROUTINE W REFLEX MICROSCOPIC
Bilirubin Urine: NEGATIVE
Glucose, UA: NEGATIVE mg/dL
Hgb urine dipstick: NEGATIVE
Ketones, ur: NEGATIVE mg/dL
Nitrite: NEGATIVE
PH: 5 (ref 5.0–8.0)
Protein, ur: NEGATIVE mg/dL
SPECIFIC GRAVITY, URINE: 1.021 (ref 1.005–1.030)
SQUAMOUS EPITHELIAL / LPF: NONE SEEN

## 2016-06-10 LAB — GLUCOSE, CAPILLARY
GLUCOSE-CAPILLARY: 102 mg/dL — AB (ref 65–99)
GLUCOSE-CAPILLARY: 107 mg/dL — AB (ref 65–99)
GLUCOSE-CAPILLARY: 117 mg/dL — AB (ref 65–99)
GLUCOSE-CAPILLARY: 128 mg/dL — AB (ref 65–99)
Glucose-Capillary: 103 mg/dL — ABNORMAL HIGH (ref 65–99)
Glucose-Capillary: 99 mg/dL (ref 65–99)
Glucose-Capillary: 91 mg/dL (ref 65–99)

## 2016-06-10 MED ORDER — OXYCODONE HCL 5 MG/5ML PO SOLN
5.0000 mg | ORAL | Status: DC | PRN
  Administered 2016-06-10 – 2016-06-12 (×3): 5 mg via ORAL
  Filled 2016-06-10 (×3): qty 5

## 2016-06-10 MED ORDER — IPRATROPIUM-ALBUTEROL 0.5-2.5 (3) MG/3ML IN SOLN
3.0000 mL | RESPIRATORY_TRACT | Status: DC | PRN
  Administered 2016-06-15: 3 mL via RESPIRATORY_TRACT
  Filled 2016-06-10: qty 3
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
## 2016-06-11 ENCOUNTER — Telehealth: Payer: Self-pay | Admitting: Vascular Surgery

## 2016-06-11 LAB — CBC
HEMATOCRIT: 30.6 % — AB (ref 39.0–52.0)
HEMOGLOBIN: 9.4 g/dL — AB (ref 13.0–17.0)
MCH: 28 pg (ref 26.0–34.0)
MCHC: 30.7 g/dL (ref 30.0–36.0)
MCV: 91.1 fL (ref 78.0–100.0)
Platelets: 382 10*3/uL (ref 150–400)
RBC: 3.36 MIL/uL — ABNORMAL LOW (ref 4.22–5.81)
RDW: 16.2 % — AB (ref 11.5–15.5)
WBC: 12.3 10*3/uL — ABNORMAL HIGH (ref 4.0–10.5)

## 2016-06-11 LAB — GLUCOSE, CAPILLARY
GLUCOSE-CAPILLARY: 92 mg/dL (ref 65–99)
GLUCOSE-CAPILLARY: 97 mg/dL (ref 65–99)
GLUCOSE-CAPILLARY: 88 mg/dL (ref 65–99)
GLUCOSE-CAPILLARY: 106 mg/dL — AB (ref 65–99)
GLUCOSE-CAPILLARY: 99 mg/dL (ref 65–99)
Glucose-Capillary: 95 mg/dL (ref 65–99)

## 2016-06-11 LAB — HEPARIN LEVEL (UNFRACTIONATED): Heparin Unfractionated: <0.1 [IU]/mL — ABNORMAL LOW (ref 0.30–0.70)

## 2016-06-11 MED ORDER — SODIUM CHLORIDE 0.9 % IV BOLUS (SEPSIS)
500.0000 mL | Freq: Once | INTRAVENOUS | Status: AC
  Administered 2016-06-11: 500 mL via INTRAVENOUS

## 2016-06-11 MED ORDER — QUETIAPINE FUMARATE 25 MG PO TABS
50.0000 mg | ORAL_TABLET | Freq: Every day | ORAL | Status: DC
  Administered 2016-06-11: 50 mg via ORAL
  Filled 2016-06-11: qty 2

## 2016-06-11 MED ORDER — HEPARIN (PORCINE) IN NACL 100-0.45 UNIT/ML-% IJ SOLN
1700.0000 [IU]/h | INTRAMUSCULAR | Status: DC
  Administered 2016-06-11: 1100 [IU]/h via INTRAVENOUS
  Administered 2016-06-12: 1400 [IU]/h via INTRAVENOUS
  Administered 2016-06-13 – 2016-06-16 (×7): 1650 [IU]/h via INTRAVENOUS
  Administered 2016-06-18 – 2016-06-20 (×4): 1700 [IU]/h via INTRAVENOUS
  Filled 2016-06-11 (×16): qty 250

## 2016-06-11 MED ORDER — GUAIFENESIN 100 MG/5ML PO SOLN
15.0000 mL | Freq: Four times a day (QID) | ORAL | Status: DC
  Administered 2016-06-11 – 2016-06-20 (×31): 300 mg
  Filled 2016-06-11 (×5): qty 15
  Filled 2016-06-11: qty 5
  Filled 2016-06-11 (×22): qty 15
  Filled 2016-06-11: qty 5
  Filled 2016-06-11 (×2): qty 15

## 2016-06-11 NOTE — Telephone Encounter (Signed)
-----   Message from Sharee PimpleMarilyn K McChesney, RN sent at 06/08/2016  4:22 PM EST ----- Regarding: 4-6 weeks with carotid duplex   ----- Message ----- From: Sherren Kernsharles E Fields, MD Sent: 06/08/2016   4:07 PM To: Vvs Charge Pool  Level 5 consult from Dr Lindie SpruceWyatt for symptomatic carotid stenosis  Needs office visit with me in 4-6 weeks with carotid duplex at that time Can go on greenlist in brackets  3M CompanyCharles Fields

## 2016-06-11 NOTE — Telephone Encounter (Signed)
phone just rang, no answr or VM, mailing letter for appt  8:30 US, 10 OV 07/19/16

## 2016-06-11 NOTE — Progress Notes (Signed)
Was told in report by prior RN that MD wanted patient to receive Seroquel at bedtime due to family concern that patient has not been sleeping at night and is having difficulty cooperating with PT and speech during the day. No order found for Seroquel or note in chart about patient receiving medication. Trauma MD paged and order received from Dr. Sheliah HatchKinsinger to administer medication to patient.
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
## 2016-06-11 NOTE — Progress Notes (Signed)
I continue to follow pt's progress. I await pt's functional level to improve before pursuing insurance approval for a possible inpt rehab admission. I have updated his wife and daughter, Philippa ChesterBrittney. [phone]

## 2016-06-11 NOTE — Progress Notes (Signed)
Pt SBP below neurology's recommendations. Dr. Amada JupiterKirkpatrick notified. Order received for one time 500ml bolus. Will monitor.

## 2016-06-12 LAB — CBC
HEMATOCRIT: 29.5 % — AB (ref 39.0–52.0)
Hemoglobin: 9.3 g/dL — ABNORMAL LOW (ref 13.0–17.0)
MCH: 28.4 pg (ref 26.0–34.0)
MCHC: 31.5 g/dL (ref 30.0–36.0)
MCV: 90.2 fL (ref 78.0–100.0)
Platelets: 381 10*3/uL (ref 150–400)
RBC: 3.27 MIL/uL — AB (ref 4.22–5.81)
RDW: 16.4 % — ABNORMAL HIGH (ref 11.5–15.5)
WBC: 10.5 10*3/uL (ref 4.0–10.5)

## 2016-06-12 LAB — GLUCOSE, CAPILLARY
GLUCOSE-CAPILLARY: 49 mg/dL — AB (ref 65–99)
GLUCOSE-CAPILLARY: 75 mg/dL (ref 65–99)
GLUCOSE-CAPILLARY: 87 mg/dL (ref 65–99)
GLUCOSE-CAPILLARY: 91 mg/dL (ref 65–99)
GLUCOSE-CAPILLARY: 96 mg/dL (ref 65–99)
Glucose-Capillary: 117 mg/dL — ABNORMAL HIGH (ref 65–99)
Glucose-Capillary: 90 mg/dL (ref 65–99)

## 2016-06-12 LAB — HEPARIN LEVEL (UNFRACTIONATED)
Heparin Unfractionated: 0.16 [IU]/mL — ABNORMAL LOW (ref 0.30–0.70)
Heparin Unfractionated: 0.33 [IU]/mL (ref 0.30–0.70)

## 2016-06-12 MED ORDER — METHOCARBAMOL 1000 MG/10ML IJ SOLN: 500.0000 mg | Freq: Three times a day (TID) | INTRAVENOUS | Status: DC | PRN

## 2016-06-12 MED ORDER — DEXTROSE 50 % IV SOLN
INTRAVENOUS | Status: AC
  Filled 2016-06-12: qty 50

## 2016-06-12 MED ORDER — DEXTROSE 50 % IV SOLN
25.0000 mL | Freq: Once | INTRAVENOUS | Status: AC
  Administered 2016-06-12: 25 mL via INTRAVENOUS

## 2016-06-12 MED ORDER — FENTANYL CITRATE (PF) 100 MCG/2ML IJ SOLN
25.0000 ug | INTRAMUSCULAR | Status: DC | PRN
  Administered 2016-06-13: 50 ug via INTRAVENOUS
  Filled 2016-06-12: qty 2

## 2016-06-12 MED ORDER — QUETIAPINE FUMARATE 25 MG PO TABS
25.0000 mg | ORAL_TABLET | Freq: Every day | ORAL | Status: DC
  Administered 2016-06-13: 25 mg via ORAL
  Filled 2016-06-12: qty 1

## 2016-06-12 NOTE — Progress Notes (Signed)
ANTICOAGULATION CONSULT NOTE - Follow Up Consult  Pharmacy Consult for Heparin Indication: CVA  No Known Allergies  Patient Measurements: Height: 5\' 10"  (177.8 cm) Weight: 229 lb 8 oz (104.1 kg) IBW/kg (Calculated) : 73 Heparin Dosing Weight:    Vital Signs: Temp: 97.6 F (36.4 C) (01/23 2000) Temp Source: Axillary (01/23 2000) BP: 112/93 (01/23 2100) Pulse Rate: 62 (01/23 2100)  Labs:  Recent Labs  06/11/16 1401 06/11/16 2324 06/12/16 0356 06/12/16 1109 06/12/16 2110  HGB 9.4*  --  9.3*  --   --   HCT 30.6*  --  29.5*  --   --   PLT 382  --  381  --   --   HEPARINUNFRC  --  <0.10*  --  0.16* 0.33    Estimated Creatinine Clearance: 79.1 mL/min (by C-G formula based on SCr of 0.96 mg/dL).   Assessment:  Anticoag: right MCA stroke, A. fib, right ICA high-grade stenosis. HL 0.33 in goal range.  Goal of Therapy:  Heparin level 0.3-0.5 units/ml Monitor platelets by anticoagulation protocol: Yes   Plan:  Continue IV heparin at 1650 units/hr HL and CBC in AM   Allyn Bertoni S. Merilynn Finlandobertson, PharmD, BCPS Clinical Staff Pharmacist Pager [phone]  Misty Stanleyobertson, Neesha Langton Stillinger 06/12/2016,10:10 PM

## 2016-06-12 NOTE — Progress Notes (Signed)
Patient had a small loose dark red bowel movement. Heparin gtt infusing at 1650 units/hr. Notified Dr Corliss Skainssuei and ordered to continue to monitor, keep heparin infusing, and verify theres a CBC ordered for 5am.   Bengie Kaucher, [place] Marie, RN 9:19 PM
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
## 2016-06-12 NOTE — Progress Notes (Signed)
Hypoglycemic Event  CBG: 49  Treatment: D50 IV 25 mL  Symptoms: hungry  Follow-up CBG: Time:2341 CBG Result:87  Possible Reasons for Event: Inadequate meal intake- patient pulled out cortrak earlier today, now NPO and no tube feeds  Comments/MD notified: Will monitor closely.    Gregory [name], Gregory [name]
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
## 2016-06-12 NOTE — Progress Notes (Signed)
ANTICOAGULATION CONSULT NOTE  Pharmacy Consult for heparin Indication: Afib/carotid stenosis  No Known Allergies  Patient Measurements: Height: 5\' 10"  (177.8 cm) Weight: 229 lb 8 oz (104.1 kg) IBW/kg (Calculated) : 73 Heparin Dosing Weight: 85 kg  Vital Signs: Temp: 98.5 F (36.9 C) (01/23 0800) Temp Source: Axillary (01/23 0800) BP: 99/88 (01/23 0800) Pulse Rate: 75 (01/23 0800)  Labs:  Recent Labs  06/11/16 1401 06/11/16 2324 06/12/16 0356 06/12/16 1109  HGB 9.4*  --  9.3*  --   HCT 30.6*  --  29.5*  --   PLT 382  --  381  --   HEPARINUNFRC  --  <0.10*  --  0.16*    Estimated Creatinine Clearance: 79.1 mL/min (by C-G formula based on SCr of 0.96 mg/dL).  Assessment: 76 y.o. male with with Afib, carotid stenosis, and recent CVA on heparin per pharmacy.  No bolus and low goal per MD orders.  Heparin level below goal at 0.16 after rate increased to 1400 units/hr.  Hg stable at 9.3, pltc WNL, no bleeding reported.   Goal of Therapy:  Heparin level 0.3 - 0.5 units/ml Monitor platelets by anticoagulation protocol: Yes   Plan: no boluses per MD order Increase Heparin 1650 units/hr Check heparin level in 8 hours.   Daily HL and CBC while on heparin FEES tomorrow, if passes, transition to eliquis  Herby AbrahamMichelle T. Macallister Ashmead, Pharm.D. [phone] 06/12/2016 12:31 PM
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
## 2016-06-12 NOTE — Progress Notes (Signed)
ANTICOAGULATION CONSULT NOTE  Pharmacy Consult for heparin Indication: Afib/carotid stenosis  No Known Allergies  Patient Measurements: Height: 5\' 10"  (177.8 cm) Weight: 226 lb 3.1 oz (102.6 kg) IBW/kg (Calculated) : 73 Heparin Dosing Weight: 85 kg  Vital Signs: Temp: 98.3 F (36.8 C) (01/23 0000) Temp Source: Axillary (01/23 0000) BP: 130/59 (01/22 2000) Pulse Rate: 70 (01/22 2000)  Labs:  Recent Labs  06/09/16 0310 06/11/16 1401 06/11/16 2324  HGB 9.4* 9.4*  --   HCT 29.7* 30.6*  --   PLT 277 382  --   HEPARINUNFRC  --   --  <0.10*  CREATININE 0.96  --   --     Estimated Creatinine Clearance: 78.5 mL/min (by C-G formula based on SCr of 0.96 mg/dL).  Assessment: 76 y.o. male with with Afib, carotid stenosis, and recent CVA for heparin   Goal of Therapy:  Heparin level 0.3 - 0.5 units/ml Monitor platelets by anticoagulation protocol: Yes   Plan:  Increase Heparin 1400 units/hr .Check heparin level in 8 hours.    Geannie RisenGreg Arieona Swaggerty, PharmD, BCPS  06/12/2016 12:37 AM
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
## 2016-06-13 ENCOUNTER — Inpatient Hospital Stay (HOSPITAL_COMMUNITY): Payer: Medicare Other

## 2016-06-13 LAB — GLUCOSE, CAPILLARY
GLUCOSE-CAPILLARY: 71 mg/dL (ref 65–99)
GLUCOSE-CAPILLARY: 73 mg/dL (ref 65–99)
GLUCOSE-CAPILLARY: 85 mg/dL (ref 65–99)
Glucose-Capillary: 75 mg/dL (ref 65–99)
Glucose-Capillary: 73 mg/dL (ref 65–99)
Glucose-Capillary: 87 mg/dL (ref 65–99)

## 2016-06-13 LAB — HEPARIN LEVEL (UNFRACTIONATED): HEPARIN UNFRACTIONATED: 0.5 [IU]/mL (ref 0.30–0.70)

## 2016-06-13 LAB — CBC
HEMATOCRIT: 31.7 % — AB (ref 39.0–52.0)
Hemoglobin: 9.7 g/dL — ABNORMAL LOW (ref 13.0–17.0)
MCH: 27.5 pg (ref 26.0–34.0)
MCHC: 30.6 g/dL (ref 30.0–36.0)
MCV: 89.8 fL (ref 78.0–100.0)
PLATELETS: 423 10*3/uL — AB (ref 150–400)
RBC: 3.53 MIL/uL — ABNORMAL LOW (ref 4.22–5.81)
RDW: 16.2 % — AB (ref 11.5–15.5)
WBC: 9 10*3/uL (ref 4.0–10.5)

## 2016-06-13 NOTE — Progress Notes (Signed)
ANTICOAGULATION CONSULT NOTE - Follow Up Consult  Pharmacy Consult for Heparin Indication: CVA  No Known Allergies  Patient Measurements: Height: 5\' 10"  (177.8 cm) Weight: 224 lb 6.9 oz (101.8 kg) IBW/kg (Calculated) : 73 Heparin Dosing Weight:    Vital Signs: Temp: 98.3 F (36.8 C) (01/24 0800) Temp Source: Oral (01/24 0800) BP: 107/83 (01/24 0800) Pulse Rate: 68 (01/24 0700)  Labs:  Recent Labs  06/11/16 1401  06/12/16 0356 06/12/16 1109 06/12/16 2110 06/13/16 0452  HGB 9.4*  --  9.3*  --   --  9.7*  HCT 30.6*  --  29.5*  --   --  31.7*  PLT 382  --  381  --   --  423*  HEPARINUNFRC  --   < >  --  0.16* 0.33 0.50  < > = values in this interval not displayed.  Estimated Creatinine Clearance: 78.2 mL/min (by C-G formula based on SCr of 0.96 mg/dL).   Assessment:  Anticoag: right MCA stroke, A. fib, right ICA high-grade stenosis. HL 0.50 in goal range. Hg stable at 9.7.  Last night small loose dark red bowel movement per RN note.  FEES today, if passes plan to transition to eliquis.  Goal of Therapy:  Heparin level 0.3-0.5 units/ml Monitor platelets by anticoagulation protocol: Yes   Plan:  Continue IV heparin at 1650 units/hr HL and CBC in AM F/u ability to transition to eliquis  Gregory AbrahamMichelle T. Monty Spicher, Pharm.D. [phone] 06/13/2016 9:39 AM
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
## 2016-06-14 LAB — HEPARIN LEVEL (UNFRACTIONATED): Heparin Unfractionated: 0.32 [IU]/mL (ref 0.30–0.70)

## 2016-06-14 LAB — CBC
HCT: 30.7 % — ABNORMAL LOW (ref 39.0–52.0)
Hemoglobin: 9.5 g/dL — ABNORMAL LOW (ref 13.0–17.0)
MCH: 27.6 pg (ref 26.0–34.0)
MCHC: 30.9 g/dL (ref 30.0–36.0)
MCV: 89.2 fL (ref 78.0–100.0)
Platelets: 463 10*3/uL — ABNORMAL HIGH (ref 150–400)
RBC: 3.44 MIL/uL — ABNORMAL LOW (ref 4.22–5.81)
RDW: 16.3 % — ABNORMAL HIGH (ref 11.5–15.5)
WBC: 9.5 10*3/uL (ref 4.0–10.5)

## 2016-06-14 LAB — GLUCOSE, CAPILLARY
GLUCOSE-CAPILLARY: 105 mg/dL — AB (ref 65–99)
Glucose-Capillary: 113 mg/dL — ABNORMAL HIGH (ref 65–99)
Glucose-Capillary: 123 mg/dL — ABNORMAL HIGH (ref 65–99)
Glucose-Capillary: 112 mg/dL — ABNORMAL HIGH (ref 65–99)
Glucose-Capillary: 115 mg/dL — ABNORMAL HIGH (ref 65–99)

## 2016-06-14 MED ORDER — ASPIRIN 325 MG PO TABS
ORAL_TABLET | ORAL | Status: AC
  Filled 2016-06-14: qty 1

## 2016-06-14 MED ORDER — JEVITY 1.2 CAL PO LIQD
1000.0000 mL | ORAL | Status: DC
  Administered 2016-06-14 – 2016-06-19 (×4): 1000 mL
  Filled 2016-06-14 (×12): qty 1000

## 2016-06-14 MED ORDER — PRO-STAT SUGAR FREE PO LIQD
30.0000 mL | Freq: Two times a day (BID) | ORAL | Status: DC
  Administered 2016-06-14 – 2016-06-20 (×13): 30 mL
  Filled 2016-06-14 (×13): qty 30

## 2016-06-14 MED ORDER — ASPIRIN 325 MG PO TABS
325.0000 mg | ORAL_TABLET | Freq: Every day | ORAL | Status: DC
  Administered 2016-06-14 – 2016-06-20 (×7): 325 mg via ORAL
  Filled 2016-06-14 (×7): qty 1

## 2016-06-14 MED ORDER — MORPHINE SULFATE (PF) 2 MG/ML IV SOLN
2.0000 mg | INTRAVENOUS | Status: DC | PRN
  Administered 2016-06-17: 2 mg via INTRAVENOUS
  Filled 2016-06-14: qty 1

## 2016-06-14 MED ORDER — HYDROCODONE-ACETAMINOPHEN 7.5-325 MG/15ML PO SOLN
10.0000 mL | ORAL | Status: DC | PRN
  Administered 2016-06-16: 15 mL
  Administered 2016-06-17: 10 mL
  Administered 2016-06-17 – 2016-06-19 (×8): 15 mL
  Filled 2016-06-14 (×11): qty 15

## 2016-06-14 MED ORDER — PANTOPRAZOLE SODIUM 40 MG PO PACK
40.0000 mg | PACK | Freq: Every day | ORAL | Status: DC
Start: 2016-06-14 — End: 2016-06-20
  Administered 2016-06-15 – 2016-06-20 (×6): 40 mg
  Filled 2016-06-14 (×6): qty 20

## 2016-06-14 NOTE — Progress Notes (Signed)
ANTICOAGULATION CONSULT NOTE  Pharmacy Consult for Heparin Indication: CVA  Patient Measurements: Height: 5\' 10"  (177.8 cm) Weight: 224 lb 6.9 oz (101.8 kg) IBW/kg (Calculated) : 73    Vital Signs: Temp: 97.8 F (36.6 C) (01/25 0400) Temp Source: Oral (01/25 0400) BP: 139/89 (01/25 0600) Pulse Rate: 72 (01/25 0600)   Recent Labs  06/12/16 0356  06/12/16 2110 06/13/16 0452 06/14/16 0407  HGB 9.3*  --   --  9.7* 9.5*  HCT 29.5*  --   --  31.7* 30.7*  PLT 381  --   --  423* 463*  HEPARINUNFRC  --   < > 0.33 0.50 0.32  < > = values in this interval not displayed.   Assessment: R MCA stroke, A. fib, right ICA high-grade stenosis. Heparin level in goal range. Hg stable at 9.5.  Abdominal wall hematoma and recent hematochezia noted.    Goal of Therapy:  Heparin level 0.3-0.5 units/ml Monitor platelets by anticoagulation protocol: Yes    Plan:  Continue heparin infusion at 1650 units/hr HL and CBC in AM F/u plan for oral anticoagulation    Agapito GamesAlison Hermen Mario, PharmD, BCPS Clinical Pharmacist 06/14/2016 7:23 AM
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
## 2016-06-14 NOTE — Progress Notes (Deleted)
CRITICAL VALUE ALERT  Critical value received: Troponin 0.05  Date of notification:  06/14/16  Time of notification:  2110  Critical value read back:Yes.    Nurse who received alert:  Chima Astorino  MD notified (1st page):  Fudim  Time of first page:  2115  MD notified (2nd page):  Time of second page:  Responding MD:  Fudim  Time MD responded:  2116  No orders received at this time. Will continue to monitor.  
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
## 2016-06-14 NOTE — Progress Notes (Signed)
No bed is available today for this pt. I will begin insurance authorization tomorrow for a possible admission to inpt rehab bed pending insurance approval and bed availability over the next several days. [phone]
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
## 2016-06-15 ENCOUNTER — Inpatient Hospital Stay (HOSPITAL_COMMUNITY): Payer: Medicare Other

## 2016-06-15 LAB — CBC
HCT: 31.9 % — ABNORMAL LOW (ref 39.0–52.0)
Hemoglobin: 9.7 g/dL — ABNORMAL LOW (ref 13.0–17.0)
MCH: 27.6 pg (ref 26.0–34.0)
MCHC: 30.4 g/dL (ref 30.0–36.0)
MCV: 90.6 fL (ref 78.0–100.0)
PLATELETS: 547 10*3/uL — AB (ref 150–400)
RBC: 3.52 MIL/uL — AB (ref 4.22–5.81)
RDW: 16.3 % — AB (ref 11.5–15.5)
WBC: 11.3 10*3/uL — ABNORMAL HIGH (ref 4.0–10.5)

## 2016-06-15 LAB — GLUCOSE, CAPILLARY
GLUCOSE-CAPILLARY: 111 mg/dL — AB (ref 65–99)
GLUCOSE-CAPILLARY: 119 mg/dL — AB (ref 65–99)
GLUCOSE-CAPILLARY: 123 mg/dL — AB (ref 65–99)
Glucose-Capillary: 117 mg/dL — ABNORMAL HIGH (ref 65–99)
Glucose-Capillary: 108 mg/dL — ABNORMAL HIGH (ref 65–99)
Glucose-Capillary: 105 mg/dL — ABNORMAL HIGH (ref 65–99)

## 2016-06-15 LAB — BRAIN NATRIURETIC PEPTIDE: B Natriuretic Peptide: 264.7 pg/mL — ABNORMAL HIGH (ref 0.0–100.0)

## 2016-06-15 LAB — HEPARIN LEVEL (UNFRACTIONATED): Heparin Unfractionated: 0.37 IU/mL (ref 0.30–0.70)

## 2016-06-15 MED ORDER — FUROSEMIDE 10 MG/ML IJ SOLN
40.0000 mg | Freq: Two times a day (BID) | INTRAMUSCULAR | Status: DC
  Administered 2016-06-15 – 2016-06-17 (×4): 40 mg via INTRAVENOUS
  Filled 2016-06-15 (×3): qty 4

## 2016-06-15 MED ORDER — FUROSEMIDE 40 MG PO TABS
40.0000 mg | ORAL_TABLET | Freq: Two times a day (BID) | ORAL | Status: DC
  Administered 2016-06-15: 40 mg
  Filled 2016-06-15 (×2): qty 1

## 2016-06-15 MED ORDER — FUROSEMIDE 10 MG/ML IJ SOLN
INTRAMUSCULAR | Status: AC
  Filled 2016-06-15: qty 4

## 2016-06-15 MED ORDER — FUROSEMIDE 8 MG/ML PO SOLN
40.0000 mg | Freq: Two times a day (BID) | ORAL | Status: DC
  Filled 2016-06-15: qty 5

## 2016-06-15 MED ORDER — POTASSIUM CHLORIDE 20 MEQ/15ML (10%) PO SOLN
20.0000 meq | Freq: Two times a day (BID) | ORAL | Status: DC
  Administered 2016-06-15 – 2016-06-17 (×5): 20 meq via ORAL
  Filled 2016-06-15 (×5): qty 15
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
## 2016-06-15 NOTE — Progress Notes (Signed)
cortrak team off, RN on 3100 very busy at this time, attempted NG with no success. Paged 3100 RN and will come and reinsert cortark.

## 2016-06-15 NOTE — Progress Notes (Signed)
Pt tachypneic RR going up to 37, wheezing, sweating on his forehead. Breathing treatment given, an hour later wheezing returned. Wheezing audible from outside pts room. MD Andrey CampanileWilson paged. MD advised to order stat portable CXR. CXR ordered. Will continue to monitor.

## 2016-06-15 NOTE — Progress Notes (Signed)
Subjective:  Drowsy and confused this morning.   Objective:  Vital Signs in the last 24 hours: Temp:  [97.9 F (36.6 C)-98.7 F (37.1 C)] 98.4 F (36.9 C) (01/26 1546) Pulse Rate:  [61-80] 67 (01/26 1546) Resp:  [21-43] 23 (01/26 1546) BP: (125-149)/(73-94) 130/81 (01/26 1546) SpO2:  [100 %] 100 % (01/26 1546) Weight:  [103.6 kg (228 lb 6.3 oz)] 103.6 kg (228 lb 6.3 oz) (01/26 0448)  Intake/Output from previous day: 01/25 0701 - 01/26 0700 In: 3144.5 [I.V.:1696.5; NG/GT:1448] Out: 600 [Urine:600]  Physical Exam: General appearance: appears stated age, mild distress, mildly obese and slowed mentation Eyes: negative findings: lids and lashes normal Neck: no JVD, supple, symmetrical, trachea midline, thyroid not enlarged, symmetric, no tenderness/mass/nodules and Bilateral carotid bruit present. Neck: JVP - normal, carotids 2+= without bruits Resp: clear to auscultation bilaterally Chest wall: no tenderness Cardio: S1 is variable, S2 is normal.  No gallop or murmur appreciated. GI: soft, non-tender; bowel sounds normal; no masses,  no organomegaly Extremities: edema 2 plus bilateral leg edema  Lab Results: BMP  Recent Labs  06/04/16 0338 06/07/16 0329 06/09/16 0310  NA 140 144 142  K 4.2 3.7 4.3  CL 107 112* 113*  CO2 24 24 22   GLUCOSE 112* 103* 103*  BUN 21* 17 29*  CREATININE 1.10 1.04 0.96  CALCIUM 8.4* 8.4* 8.4*  GFRNONAA >60 >60 >60  GFRAA >60 >60 >60    CBC  Recent Labs Lab 06/09/16 0310  06/15/16 0236  WBC 10.5  < > 11.3*  RBC 3.28*  < > 3.52*  HGB 9.4*  < > 9.7*  HCT 29.7*  < > 31.9*  PLT 277  < > 547*  MCV 90.5  < > 90.6  MCH 28.7  < > 27.6  MCHC 31.6  < > 30.4  RDW 16.6*  < > 16.3*  LYMPHSABS 1.3  --   --   MONOABS 0.7  --   --   EOSABS 0.3  --   --   BASOSABS 0.0  --   --   < > = values in this interval not displayed.  HEMOGLOBIN A1C Lab Results  Component Value Date   HGBA1C 4.8 06/08/2016   MPG 91 06/08/2016   Lipid Panel      Component Value Date/Time   CHOL 105 06/08/2016 0915   TRIG 70 06/08/2016 0915   HDL 24 (L) 06/08/2016 0915   CHOLHDL 4.4 06/08/2016 0915   VLDL 14 06/08/2016 0915   LDLCALC 67 06/08/2016 0915    Hepatic Function Panel  Recent Labs  06/01/16 2127 06/02/16 0237 06/04/16 0338  PROT 6.3* 6.2* 5.9*  ALBUMIN 3.6 3.6 2.9*  AST 50* 57* 48*  ALT 6* 8* 7*  ALKPHOS 79 69 52  BILITOT 0.9 0.8 1.5*    Imaging: Imaging results have been reviewed  Cardiac Studies:  2D Echocardiogram /19/2018:  - Left ventricle: The cavity size was normal. There was severeconcentric hypertrophy. Systolic function was vigorous. Theestimated ejection fraction was in the range of 65% to 70%. Wallmotion was normal; there were no regional wall motionabnormalities. Features are consistent with a pseudonormal leftventricular filling pattern, with concomitant abnormal relaxationand increased filling pressure (grade 2 diastolic dysfunction). - Left atrium: The atrium was mildly dilated. - Tricuspid valve: There was mild regurgitation. - Pulmonary arteries: Systolic pressure was mildly increased. PApeak pressure: 39 mm Hg (S). Impressions: No cardiac source of emboli was indentified.  EKG 06/11/2016: Atrial fibrillation with controlled ventricular response at  the rate of 81 bpm, left axis deviation, no evidence of ischemia.  Normal QT interval.  Compared to 06/01/2016, wandering atrial pacemaker has been replaced by atrial fibrillation.  Telemetry 06/15/2016: Normal sinus rhythm with frequent PACs.  Scheduled Meds: . aspirin  325 mg Oral Daily  . carbidopa-levodopa  1.5 tablet Oral QID  . docusate  100 mg Oral BID  . feeding supplement (PRO-STAT SUGAR FREE 64)  30 mL Per Tube BID  . furosemide  40 mg Per Tube BID  . guaiFENesin  15 mL Per Tube Q6H  . mouth rinse  15 mL Mouth Rinse BID  . pantoprazole sodium  40 mg Per Tube Daily   Continuous Infusions: . feeding supplement (JEVITY 1.2 CAL) 1,000 mL  (06/14/16 1712)  . heparin 1,650 Units/hr (06/15/16 1311)  . sodium chloride 0.9 % 1,000 mL with potassium chloride 20 mEq infusion 75 mL/hr at 06/15/16 0320   PRN Meds:.albuterol, HYDROcodone-acetaminophen, ipratropium-albuterol, methocarbamol (ROBAXIN)  IV, methocarbamol, morphine injection, ondansetron **OR** ondansetron (ZOFRAN) IV   Assessment/Plan:  1. New onset A. Fibrillation with controlled ventricular response. CHA2DS2-VASCScore: Risk Score  6,  Yearly risk of stroke  9.8. Recommendation: Anticoagulation. 2. Acute on chronic diastolic heart failure. Mild worsening respiratory effort this morning and less alert.  3.  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  Symptomatic right ICA stenosis of high-grade, needs carotid endarterectomy. 5.  Hyperlipidemia 6.  History of Parkinson's disease 7.  Hypertension  Recommendation: He is presently wall remote loaded from all the IV fluids that he has been getting, agree with starting IV Lasix for 2-3 days for diuresis, chest x-ray also revealing increased vascular congestion. Will change to IV form lasix  Once he passes swallowing evaluation, we'll switch him to oral anticoagulation.  He is now back in sinus rhythm on telemetry.  He has occasional PACs.  Unless problems, I will see him back on Monday.  Yates Decamp, M.D. 06/15/2016, 6:19 PM [facility] Pager: [phone] Office: [phone] If no answer: [phone]

## 2016-06-15 NOTE — Care Management Important Message (Signed)
Important Message  Patient Details  Name: Gregory [name] MRN: [id number] Date of Birth: Sep 18, 1940   Medicare Important Message Given:  Yes    Kyla BalzarineShealy, Ginnifer Creelman Abena 06/15/2016, 2:07 PM

## 2016-06-15 NOTE — Progress Notes (Signed)
ANTICOAGULATION CONSULT NOTE  Pharmacy Consult for Heparin Indication: CVA  Patient Measurements: Height: 5\' 10"  (177.8 cm) Weight: 228 lb 6.3 oz (103.6 kg) IBW/kg (Calculated) : 73    Vital Signs: Temp: 98.4 F (36.9 C) (01/26 0700) Temp Source: Oral (01/26 0700) BP: 127/79 (01/26 0700) Pulse Rate: 76 (01/26 0700)   Recent Labs  06/13/16 0452 06/14/16 0407 06/15/16 0236  HGB 9.7* 9.5* 9.7*  HCT 31.7* 30.7* 31.9*  PLT 423* 463* 547*  HEPARINUNFRC 0.50 0.32 0.37     Assessment: R MCA stroke, A. fib, right ICA high-grade stenosis. Heparin level remains in therapeutic range on heparin infusion at 1650 units/hr. Hgb stable at 9.7. Multifocal traumatic hematoma(s) and recent hematochezia noted.    Goal of Therapy:  Heparin level 0.3-0.5 units/ml Monitor platelets by anticoagulation protocol: Yes    Plan:  1. Continue heparin infusion at 1650 units/hr 2. HL and CBC in AM 3. Noted tentative plans to transition to apixaban once able to take PO   Pollyann SamplesAndy Gudelia Eugene, PharmD, BCPS 06/15/2016, 7:31 AM
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
## 2016-06-15 NOTE — Progress Notes (Signed)
Paged dr.  Janee Mornhompson, noticed pt had cortrak out beside him in the bed on hourly rounds, mittens off. Family not present in room. New orders rec'd to see if may reinsert cortrak or place NG tube for meds and feedings.
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
## 2016-06-15 NOTE — Progress Notes (Signed)
[facility] Monday pending insurance approval and bed availability. I have updated pt's wife in [id number] with her daughter, [name] , at bedside. I will follow up on Monday. [phone]
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
## 2016-06-16 ENCOUNTER — Inpatient Hospital Stay (HOSPITAL_COMMUNITY): Payer: Medicare Other

## 2016-06-16 LAB — BASIC METABOLIC PANEL
Anion gap: 8 (ref 5–15)
BUN: 52 mg/dL — AB (ref 6–20)
CHLORIDE: 113 mmol/L — AB (ref 101–111)
CO2: 23 mmol/L (ref 22–32)
Calcium: 8.5 mg/dL — ABNORMAL LOW (ref 8.9–10.3)
Creatinine, Ser: 1.74 mg/dL — ABNORMAL HIGH (ref 0.61–1.24)
GFR calc Af Amer: 42 mL/min — ABNORMAL LOW (ref >60)
GFR calc non Af Amer: 36 mL/min — ABNORMAL LOW (ref >60)
GLUCOSE: 110 mg/dL — AB (ref 65–99)
POTASSIUM: 4.7 mmol/L (ref 3.5–5.1)
Sodium: 144 mmol/L (ref 135–145)

## 2016-06-16 LAB — GLUCOSE, CAPILLARY
GLUCOSE-CAPILLARY: 111 mg/dL — AB (ref 65–99)
GLUCOSE-CAPILLARY: 119 mg/dL — AB (ref 65–99)
GLUCOSE-CAPILLARY: 119 mg/dL — AB (ref 65–99)
Glucose-Capillary: 119 mg/dL — ABNORMAL HIGH (ref 65–99)
Glucose-Capillary: 122 mg/dL — ABNORMAL HIGH (ref 65–99)
Glucose-Capillary: 105 mg/dL — ABNORMAL HIGH (ref 65–99)

## 2016-06-16 LAB — HEPARIN LEVEL (UNFRACTIONATED): Heparin Unfractionated: 0.35 IU/mL (ref 0.30–0.70)

## 2016-06-16 LAB — CBC
HEMATOCRIT: 30.2 % — AB (ref 39.0–52.0)
HEMOGLOBIN: 9.1 g/dL — AB (ref 13.0–17.0)
MCH: 27.7 pg (ref 26.0–34.0)
MCHC: 30.1 g/dL (ref 30.0–36.0)
MCV: 92.1 fL (ref 78.0–100.0)
Platelets: 539 10*3/uL — ABNORMAL HIGH (ref 150–400)
RBC: 3.28 MIL/uL — AB (ref 4.22–5.81)
RDW: 16.2 % — ABNORMAL HIGH (ref 11.5–15.5)
WBC: 11.2 10*3/uL — ABNORMAL HIGH (ref 4.0–10.5)

## 2016-06-16 LAB — BRAIN NATRIURETIC PEPTIDE: B NATRIURETIC PEPTIDE 5: 291.3 pg/mL — AB (ref 0.0–100.0)

## 2016-06-16 MED ORDER — ORAL CARE MOUTH RINSE
15.0000 mL | Freq: Two times a day (BID) | OROMUCOSAL | Status: DC
  Administered 2016-06-16 – 2016-06-20 (×9): 15 mL via OROMUCOSAL

## 2016-06-16 MED ORDER — CHLORHEXIDINE GLUCONATE 0.12 % MT SOLN
15.0000 mL | Freq: Two times a day (BID) | OROMUCOSAL | Status: DC
  Administered 2016-06-16 – 2016-06-20 (×9): 15 mL via OROMUCOSAL
  Filled 2016-06-16 (×10): qty 15
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
## 2016-06-16 NOTE — Progress Notes (Signed)
Patient ID: Gregory LoronRichard Checketts, male   DOB: 1941-04-17, 76 y.o.   MRN: [id number]  BP 133/82 (BP Location: Left Arm)   Pulse 72   Temp 99.2 F (37.3 C) (Oral)   Resp (!) 25   Ht 5\' 10"  (1.778 m)   Wt 103.9 kg (229 lb 0.9 oz)   SpO2 100%   BMI 32.87 kg/m  Lethargic, arouses with moderate stimulation Non fluent, not following commands Moves all extremities Awaiting thoracolumbar films which have been ordered Remains neurologically impaired.

## 2016-06-17 LAB — GLUCOSE, CAPILLARY
GLUCOSE-CAPILLARY: 128 mg/dL — AB (ref 65–99)
GLUCOSE-CAPILLARY: 100 mg/dL — AB (ref 65–99)
GLUCOSE-CAPILLARY: 104 mg/dL — AB (ref 65–99)
Glucose-Capillary: 101 mg/dL — ABNORMAL HIGH (ref 65–99)
Glucose-Capillary: 103 mg/dL — ABNORMAL HIGH (ref 65–99)
Glucose-Capillary: 103 mg/dL — ABNORMAL HIGH (ref 65–99)

## 2016-06-17 LAB — URINALYSIS, ROUTINE W REFLEX MICROSCOPIC
GLUCOSE, UA: NEGATIVE mg/dL
Ketones, ur: 15 mg/dL — AB
Nitrite: POSITIVE — AB
PH: 6.5 (ref 5.0–8.0)
Protein, ur: >300 mg/dL — AB
SPECIFIC GRAVITY, URINE: 1.015 (ref 1.005–1.030)

## 2016-06-17 LAB — BASIC METABOLIC PANEL
Anion gap: 7 (ref 5–15)
BUN: 77 mg/dL — AB (ref 6–20)
CHLORIDE: 114 mmol/L — AB (ref 101–111)
CO2: 25 mmol/L (ref 22–32)
CREATININE: 2.42 mg/dL — AB (ref 0.61–1.24)
Calcium: 8.1 mg/dL — ABNORMAL LOW (ref 8.9–10.3)
GFR calc Af Amer: 28 mL/min — ABNORMAL LOW (ref >60)
GFR calc non Af Amer: 24 mL/min — ABNORMAL LOW (ref >60)
GLUCOSE: 131 mg/dL — AB (ref 65–99)
Potassium: 4.3 mmol/L (ref 3.5–5.1)
SODIUM: 146 mmol/L — AB (ref 135–145)

## 2016-06-17 LAB — URINALYSIS, MICROSCOPIC (REFLEX)

## 2016-06-17 LAB — CBC
HEMATOCRIT: 29.6 % — AB (ref 39.0–52.0)
Hemoglobin: 9 g/dL — ABNORMAL LOW (ref 13.0–17.0)
MCH: 28 pg (ref 26.0–34.0)
MCHC: 30.4 g/dL (ref 30.0–36.0)
MCV: 91.9 fL (ref 78.0–100.0)
Platelets: 524 10*3/uL — ABNORMAL HIGH (ref 150–400)
RBC: 3.22 MIL/uL — ABNORMAL LOW (ref 4.22–5.81)
RDW: 16.7 % — AB (ref 11.5–15.5)
WBC: 10.3 10*3/uL (ref 4.0–10.5)

## 2016-06-17 LAB — TSH: TSH: 6.936 u[IU]/mL — ABNORMAL HIGH (ref 0.350–4.500)

## 2016-06-17 LAB — HEPARIN LEVEL (UNFRACTIONATED)
HEPARIN UNFRACTIONATED: 0.29 [IU]/mL — AB (ref 0.30–0.70)
HEPARIN UNFRACTIONATED: 0.4 [IU]/mL (ref 0.30–0.70)

## 2016-06-17 LAB — SODIUM, URINE, RANDOM: Sodium, Ur: 76 mmol/L

## 2016-06-17 LAB — CREATININE, URINE, RANDOM: Creatinine, Urine: 41.34 mg/dL

## 2016-06-17 LAB — BRAIN NATRIURETIC PEPTIDE: B Natriuretic Peptide: 254.1 pg/mL — ABNORMAL HIGH (ref 0.0–100.0)

## 2016-06-17 MED ORDER — CIPROFLOXACIN IN D5W 200 MG/100ML IV SOLN
200.0000 mg | Freq: Two times a day (BID) | INTRAVENOUS | Status: DC
  Administered 2016-06-17 – 2016-06-20 (×6): 200 mg via INTRAVENOUS
  Filled 2016-06-17 (×7): qty 100

## 2016-06-17 MED ORDER — FUROSEMIDE 10 MG/ML IJ SOLN
120.0000 mg | Freq: Three times a day (TID) | INTRAVENOUS | Status: DC
  Administered 2016-06-17 – 2016-06-19 (×6): 120 mg via INTRAVENOUS
  Filled 2016-06-17 (×7): qty 12
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
## 2016-06-17 NOTE — Progress Notes (Signed)
ANTICOAGULATION CONSULT NOTE  Pharmacy Consult for Heparin Indication: CVA  Patient Measurements: Height: 5\' 10"  (177.8 cm) Weight: 227 lb 8.2 oz (103.2 kg) IBW/kg (Calculated) : 73    Vital Signs: Temp: 98.5 F (36.9 C) (01/28 0322) Temp Source: Oral (01/28 0322) BP: 135/73 (01/28 0322) Pulse Rate: 71 (01/28 0322)   Recent Labs  06/14/16 0407 06/15/16 0236 06/16/16 0207 06/17/16 0240  HGB 9.5* 9.7* 9.1*  --   HCT 30.7* 31.9* 30.2*  --   PLT 463* 547* 539*  --   HEPARINUNFRC 0.32 0.37 0.35 0.29*  CREATININE  --   --  1.74*  --     Assessment: R MCA stroke, A. fib, right ICA high-grade stenosis.  Hgb stable at 9.1, Plts WNL. Multifocal traumatic hematoma(s) and recent hematochezia noted.   Heparin level dropped this morning to just below goal at 0.29 units/mL. No bleeding noted.   Goal of Therapy:  Heparin level 0.3-0.5 units/ml Monitor platelets by anticoagulation protocol: Yes   Plan:  Increase heparin infusion to 1700 units/hr Heparin level in 8 hours  Daily heparin level and CBC   Bane Hagy D. Ashlee Bewley, PharmD, BCPS Clinical Pharmacist Pager: [phone] 06/17/2016 4:06 AM

## 2016-06-17 NOTE — Progress Notes (Signed)
ANTICOAGULATION CONSULT NOTE  Pharmacy Consult for Heparin Indication: CVA  Patient Measurements: Height: 5\' 10"  (177.8 cm) Weight: 227 lb 8.2 oz (103.2 kg) IBW/kg (Calculated) : 73    Vital Signs: Temp: 98.1 F (36.7 C) (01/28 1100) Temp Source: Core (Comment) (01/28 1100) BP: 115/93 (01/28 1100) Pulse Rate: 65 (01/28 1100)   Recent Labs  06/15/16 0236 06/16/16 0207 06/17/16 0240 06/17/16 0726 06/17/16 1121  HGB 9.7* 9.1*  --  9.0*  --   HCT 31.9* 30.2*  --  29.6*  --   PLT 547* 539*  --  524*  --   HEPARINUNFRC 0.37 0.35 0.29*  --  0.40  CREATININE  --  1.74*  --  2.42*  --     Assessment: R MCA stroke, A. fib, right ICA high-grade stenosis.  Hgb stable at 9.0, Plts WNL. Multifocal traumatic hematoma(s) and recent hematochezia noted, but no current concerns of bleeding -HL 0.4, therapeutic  Goal of Therapy:  Heparin level 0.3-0.5 units/ml Monitor platelets by anticoagulation protocol: Yes   Plan:  Continue heparin infusion to 1700 units/hr Daily heparin level and CBC  Gregory KaufmanKai Areonna Bran Bernette Redbird(Kenny), PharmD  PGY1 Pharmacy Resident Pager: [phone] 06/17/2016 12:46 PM
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
## 2016-06-17 NOTE — Progress Notes (Addendum)
Subjective:  Easily arousal and oriented and answers appropriately and moves all   Objective:  Vital Signs in the last 24 hours: Temp:  [98 F (36.7 C)-98.5 F (36.9 C)] 98.1 F (36.7 C) (01/28 1100) Pulse Rate:  [62-76] 65 (01/28 1100) Resp:  [14-23] 14 (01/28 1100) BP: (115-135)/(55-93) 115/93 (01/28 1100) SpO2:  [99 %-100 %] 100 % (01/28 1100) Weight:  [103.2 kg (227 lb 8.2 oz)] 103.2 kg (227 lb 8.2 oz) (01/28 0322)  Intake/Output from previous day: 01/27 0701 - 01/28 0700 In: 429.6 [I.V.:429.6] Out: 1625 [Urine:1625]  Physical Exam:  General appearance: alert, cooperative, appears stated age, no distress, mildly obese and slowed mentation Eyes: negative findings: lids and lashes normal and conjunctivae and sclerae normal Neck: no carotid bruit, supple, symmetrical, trachea midline, thyroid not enlarged, symmetric, no tenderness/mass/nodules and Short neck and unable to comment Neck: JVP - normal, carotids 2+= without bruits Resp: Decreased breath sounds at the bases due to decreased effort. Faint crackles are heard. No wheezing. Chest wall: no tenderness Cardio: regular rate and rhythm, S1, S2 normal, no murmur, click, rub or gallop GI: soft, non-tender; bowel sounds normal; no masses,  no organomegaly and Obese Extremities: There is no edema in the lower extremity. Bilateral upper extent the show 2-3+ edema.  Lab Results: BMP  Recent Labs  06/09/16 0310 06/16/16 0207 06/17/16 0726  NA 142 144 146*  K 4.3 4.7 4.3  CL 113* 113* 114*  CO2 22 23 25   GLUCOSE 103* 110* 131*  BUN 29* 52* 77*  CREATININE 0.96 1.74* 2.42*  CALCIUM 8.4* 8.5* 8.1*  GFRNONAA >60 36* 24*  GFRAA >60 42* 28*    CBC  Recent Labs Lab 06/17/16 0726  WBC 10.3  RBC 3.22*  HGB 9.0*  HCT 29.6*  PLT 524*  MCV 91.9  MCH 28.0  MCHC 30.4  RDW 16.7*    HEMOGLOBIN A1C Lab Results  Component Value Date   HGBA1C 4.8 06/08/2016   MPG 91 06/08/2016   Hepatic Function Panel  Recent  Labs  06/01/16 2127 06/02/16 0237 06/04/16 0338  PROT 6.3* 6.2* 5.9*  ALBUMIN 3.6 3.6 2.9*  AST 50* 57* 48*  ALT 6* 8* 7*  ALKPHOS 79 69 52  BILITOT 0.9 0.8 1.5*   BNP (last 3 results)  Recent Labs  06/15/16 0935 06/16/16 0207 06/17/16 0240  BNP 264.7* 291.3* 254.1*   Imaging: Imaging results have been reviewed  Cardiac Studies:  2D Echocardiogram /19/2018: - Left ventricle: The cavity size was normal. There was severeconcentric hypertrophy. Systolic function was vigorous. Theestimated ejection fraction was in the range of 65% to 70%. Wallmotion was normal; there were no regional wall motionabnormalities. Features are consistent with a pseudonormal leftventricular filling pattern, with concomitant abnormal relaxationand increased filling pressure (grade 2 diastolic dysfunction). - Left atrium: The atrium was mildly dilated. - Tricuspid valve: There was mild regurgitation. - Pulmonary arteries: Systolic pressure was mildly increased. PApeak pressure: 39 mm Hg (S). Impressions: No cardiac source of emboli was indentified.  EKG 06/11/2016:Atrial fibrillation with controlled ventricular response at the rate of 81 bpm, left axis deviation, no evidence of ischemia. Normal QT interval. Compared to 06/01/2016, wandering atrial pacemaker has been replaced by atrial fibrillation.  Scheduled Meds: . aspirin  325 mg Oral Daily  . carbidopa-levodopa  1.5 tablet Oral QID  . chlorhexidine  15 mL Mouth Rinse BID  . docusate  100 mg Oral BID  . feeding supplement (PRO-STAT SUGAR FREE 64)  30 mL Per  Tube BID  . furosemide  40 mg Intravenous Q12H  . guaiFENesin  15 mL Per Tube Q6H  . mouth rinse  15 mL Mouth Rinse q12n4p  . pantoprazole sodium  40 mg Per Tube Daily  . potassium chloride  20 mEq Oral BID   Continuous Infusions: . feeding supplement (JEVITY 1.2 CAL) 1,000 mL (06/15/16 2054)  . heparin 1,700 Units/hr (06/17/16 0450)   PRN Meds:.albuterol,  HYDROcodone-acetaminophen, ipratropium-albuterol, methocarbamol (ROBAXIN)  IV, methocarbamol, morphine injection, ondansetron **OR** ondansetron (ZOFRAN) IV    Telemetry 06/15/2016, 06/17/2016: Normal sinus rhythm with frequent PACs. Assessment/Plan:  1. Acute on chronic diastolic heart failure 2. Acute renal failure 3. Paroxysmal atrial fibrillation, presently in sinus rhythm. CHA2DS2-VASCScore: Risk Score 6, Yearly risk of stroke 9.8. Recommendation: Anticoagulation. 4. 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 Hypertension 6. Hyperlipidemia 7. Anasarca with probable fluid in his belly with ascites and upper extremity 3+ edema. Lower extremity edema has completely resolved. Chest x-ray findings consistent with congestive heart failure.  Recommendation: Extremely difficult situation with worsening renal function, etiology not known. Not on any nephrotoxic agents. Continue Diuresis for today. F/U BMP.  He is now back in sinus rhythm and is maintaining sinus rhythm. He will need strict I's and O's and if not improved in the next 24 hours, may need stepdown bed for nursing issues. His weight is up by 20 pounds since admission. Has had fairly good urine output last night. Consider nephrology consultation. Discussed with trauma team.   Yates DecampGANJI, Rockell Faulks, M.D. 06/17/2016, 12:42 PM [facility] Pager: [phone] Office: [phone] If no answer: [phone]

## 2016-06-18 ENCOUNTER — Inpatient Hospital Stay (HOSPITAL_COMMUNITY): Payer: Medicare Other

## 2016-06-18 LAB — GLUCOSE, CAPILLARY
GLUCOSE-CAPILLARY: 124 mg/dL — AB (ref 65–99)
GLUCOSE-CAPILLARY: 101 mg/dL — AB (ref 65–99)
GLUCOSE-CAPILLARY: 101 mg/dL — AB (ref 65–99)
Glucose-Capillary: 118 mg/dL — ABNORMAL HIGH (ref 65–99)
Glucose-Capillary: 118 mg/dL — ABNORMAL HIGH (ref 65–99)
Glucose-Capillary: 124 mg/dL — ABNORMAL HIGH (ref 65–99)

## 2016-06-18 LAB — CBC
HEMATOCRIT: 30.4 % — AB (ref 39.0–52.0)
HEMOGLOBIN: 9 g/dL — AB (ref 13.0–17.0)
MCH: 27.4 pg (ref 26.0–34.0)
MCHC: 29.6 g/dL — ABNORMAL LOW (ref 30.0–36.0)
MCV: 92.4 fL (ref 78.0–100.0)
Platelets: 547 10*3/uL — ABNORMAL HIGH (ref 150–400)
RBC: 3.29 MIL/uL — ABNORMAL LOW (ref 4.22–5.81)
RDW: 16.6 % — ABNORMAL HIGH (ref 11.5–15.5)
WBC: 9.1 10*3/uL (ref 4.0–10.5)

## 2016-06-18 LAB — BASIC METABOLIC PANEL
ANION GAP: 10 (ref 5–15)
BUN: 99 mg/dL — ABNORMAL HIGH (ref 6–20)
CHLORIDE: 111 mmol/L (ref 101–111)
CO2: 24 mmol/L (ref 22–32)
Calcium: 8.2 mg/dL — ABNORMAL LOW (ref 8.9–10.3)
Creatinine, Ser: 3.99 mg/dL — ABNORMAL HIGH (ref 0.61–1.24)
GFR calc non Af Amer: 13 mL/min — ABNORMAL LOW (ref >60)
GFR, EST AFRICAN AMERICAN: 15 mL/min — AB (ref >60)
Glucose, Bld: 108 mg/dL — ABNORMAL HIGH (ref 65–99)
POTASSIUM: 5.2 mmol/L — AB (ref 3.5–5.1)
SODIUM: 145 mmol/L (ref 135–145)

## 2016-06-18 LAB — TROPONIN I
TROPONIN I: 0.03 ng/mL — AB (ref <0.03)
TROPONIN I: 0.03 ng/mL — AB (ref <0.03)
Troponin I: <0.03 ng/mL (ref <0.03)

## 2016-06-18 LAB — BRAIN NATRIURETIC PEPTIDE: B Natriuretic Peptide: 268.4 pg/mL — ABNORMAL HIGH (ref 0.0–100.0)

## 2016-06-18 LAB — HEPARIN LEVEL (UNFRACTIONATED): HEPARIN UNFRACTIONATED: 0.33 [IU]/mL (ref 0.30–0.70)

## 2016-06-18 MED ORDER — BISACODYL 10 MG RE SUPP
10.0000 mg | Freq: Every day | RECTAL | Status: DC | PRN
  Administered 2016-06-18: 10 mg via RECTAL
  Filled 2016-06-18: qty 1

## 2016-06-18 NOTE — Progress Notes (Signed)
I met with pt's wife, daughter and son at bedside. Wife is to be discharged from inpt rehab/CIR today to home. Noted foley placed with large output. I will follow to assess when to begin authorization for a possible inpt rehab admisson this week pending medical readiness. [phone]
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
## 2016-06-18 NOTE — Progress Notes (Signed)
Subjective:  Easily arousal and oriented and answers appropriately and moves all 4 extremities. Speech therapist in.   Objective:  Vital Signs in the last 24 hours: Temp:  [97.4 F (36.3 C)-98.6 F (37 C)] 97.4 F (36.3 C) (01/29 0700) Pulse Rate:  [65-75] 73 (01/29 0700) Resp:  [14-18] 16 (01/29 0700) BP: (109-134)/(58-93) 123/66 (01/29 0700) SpO2:  [99 %-100 %] 100 % (01/29 0700) Weight:  [228 lb 9.9 oz (103.7 kg)] 228 lb 9.9 oz (103.7 kg) (01/29 0314)  Intake/Output from previous day: 01/28 0701 - 01/29 0700 In: 3131.5 [I.V.:297.5; NG/GT:2610; IV Piggyback:224] Out: 1000 [Urine:1000]  Physical Exam:  General appearance: alert, cooperative, appears stated age, no distress, mildly obese and slowed mentation Eyes: negative findings: lids and lashes normal and conjunctivae and sclerae normal Neck: no carotid bruit, supple, symmetrical, trachea midline, thyroid not enlarged, symmetric, no tenderness/mass/nodules and Short neck and unable to comment Neck: JVP - normal, carotids 2+= without bruits Resp: Decreased breath sounds at the bases due to decreased effort. Faint crackles are heard. No wheezing. Chest wall: no tenderness Cardio: regular rate and rhythm, S1, S2 normal, no murmur, click, rub or gallop GI: Obese, minimal diffuse tenderness. Distended bladder  Extremities: There is no edema in the lower extremity. Bilateral upper extent the show 2-3+ edema.  Lab Results: BMP  Recent Labs  06/16/16 0207 06/17/16 0726 06/18/16 0424  NA 144 146* 145  K 4.7 4.3 5.2*  CL 113* 114* 111  CO2 23 25 24   GLUCOSE 110* 131* 108*  BUN 52* 77* 99*  CREATININE 1.74* 2.42* 3.99*  CALCIUM 8.5* 8.1* 8.2*  GFRNONAA 36* 24* 13*  GFRAA 42* 28* 15*    CBC  Recent Labs Lab 06/18/16 0424  WBC 9.1  RBC 3.29*  HGB 9.0*  HCT 30.4*  PLT 547*  MCV 92.4  MCH 27.4  MCHC 29.6*  RDW 16.6*    HEMOGLOBIN A1C Lab Results  Component Value Date   HGBA1C 4.8 06/08/2016   MPG 91  06/08/2016   Hepatic Function Panel  Recent Labs  06/01/16 2127 06/02/16 0237 06/04/16 0338  PROT 6.3* 6.2* 5.9*  ALBUMIN 3.6 3.6 2.9*  AST 50* 57* 48*  ALT 6* 8* 7*  ALKPHOS 79 69 52  BILITOT 0.9 0.8 1.5*   BNP (last 3 results)  Recent Labs  06/16/16 0207 06/17/16 0240 06/18/16 0424  BNP 291.3* 254.1* 268.4*   Imaging: Imaging results have been reviewed  Cardiac Studies:  2D Echocardiogram /19/2018: - Left ventricle: The cavity size was normal. There was severeconcentric hypertrophy. Systolic function was vigorous. Theestimated ejection fraction was in the range of 65% to 70%. Wallmotion was normal; there were no regional wall motionabnormalities. Features are consistent with a pseudonormal leftventricular filling pattern, with concomitant abnormal relaxationand increased filling pressure (grade 2 diastolic dysfunction). - Left atrium: The atrium was mildly dilated. - Tricuspid valve: There was mild regurgitation. - Pulmonary arteries: Systolic pressure was mildly increased. PApeak pressure: 39 mm Hg (S). Impressions: No cardiac source of emboli was indentified.  EKG 06/11/2016:Atrial fibrillation with controlled ventricular response at the rate of 81 bpm, left axis deviation, no evidence of ischemia. Normal QT interval. Compared to 06/01/2016, wandering atrial pacemaker has been replaced by atrial fibrillation.  Scheduled Meds: . aspirin  325 mg Oral Daily  . carbidopa-levodopa  1.5 tablet Oral QID  . chlorhexidine  15 mL Mouth Rinse BID  . ciprofloxacin  200 mg Intravenous Q12H  . docusate  100 mg Oral BID  .  feeding supplement (PRO-STAT SUGAR FREE 64)  30 mL Per Tube BID  . furosemide  120 mg Intravenous Q8H  . guaiFENesin  15 mL Per Tube Q6H  . mouth rinse  15 mL Mouth Rinse q12n4p  . pantoprazole sodium  40 mg Per Tube Daily   Continuous Infusions: . feeding supplement (JEVITY 1.2 CAL) 1,000 mL (06/15/16 2054)  . heparin 1,700 Units/hr  (06/18/16 0300)   PRN Meds:.albuterol, HYDROcodone-acetaminophen, ipratropium-albuterol, methocarbamol (ROBAXIN)  IV, methocarbamol, morphine injection, ondansetron **OR** ondansetron (ZOFRAN) IV    Telemetry 06/15/2016, 06/17/2016: Normal sinus rhythm with frequent PACs. Assessment/Plan:  1. Acute on chronic diastolic heart failure 2. Acute renal failure 3. Paroxysmal atrial fibrillation, presently in sinus rhythm. CHA2DS2-VASCScore: Risk Score 6, Yearly risk of stroke 9.8. Recommendation: Anticoagulation. 4. 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 Hypertension 6. Hyperlipidemia 7. Anasarca  Chest x-ray findings consistent with congestive heart failure.  Recommendation:  Maintains sinus with PAC. D/W Dr. Darrick Pennaeterding and his input greately appreciated. Continue diuresis for now. F/U S. Cr.  Yates DecampGANJI, Taijah Macrae, M.D. 06/18/2016, 8:45 AM [facility] Pager: [phone] Office: [phone] If no answer: [phone]

## 2016-06-18 NOTE — Progress Notes (Signed)
Pt being transported to US for kidney US.

## 2016-06-18 NOTE — Care Management Important Message (Signed)
Important Message  Patient Details  Name: Gregory [name] MRN: [id number] Date of Birth: May 24, 1940   Medicare Important Message Given:  Yes    Kyla BalzarineShealy, Marley Pakula Abena 06/18/2016, 2:05 PM
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
## 2016-06-18 NOTE — Progress Notes (Signed)
Noted Renal consult and volume overload. I will hold on pursuing insurance approval to admit pt to inpt rehab and follow at this time. [phone]
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
## 2016-06-18 NOTE — Progress Notes (Signed)
Foley placed per Dr. Darrick Pennaeterding, previous u/a sent on 1/28 positive UTI. No urine specimen sent. Foley drained 1550cc of dark brown urine.
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
## 2016-06-19 LAB — GLUCOSE, CAPILLARY
GLUCOSE-CAPILLARY: 113 mg/dL — AB (ref 65–99)
GLUCOSE-CAPILLARY: 154 mg/dL — AB (ref 65–99)
GLUCOSE-CAPILLARY: 95 mg/dL (ref 65–99)
GLUCOSE-CAPILLARY: 97 mg/dL (ref 65–99)
Glucose-Capillary: 132 mg/dL — ABNORMAL HIGH (ref 65–99)
Glucose-Capillary: 135 mg/dL — ABNORMAL HIGH (ref 65–99)

## 2016-06-19 LAB — COMPREHENSIVE METABOLIC PANEL
ALBUMIN: 3 g/dL — AB (ref 3.5–5.0)
ALK PHOS: 145 U/L — AB (ref 38–126)
ALT: 8 U/L — ABNORMAL LOW (ref 17–63)
ANION GAP: 15 (ref 5–15)
AST: 22 U/L (ref 15–41)
BUN: 103 mg/dL — ABNORMAL HIGH (ref 6–20)
CALCIUM: 9.1 mg/dL (ref 8.9–10.3)
CHLORIDE: 103 mmol/L (ref 101–111)
CO2: 29 mmol/L (ref 22–32)
Creatinine, Ser: 3.93 mg/dL — ABNORMAL HIGH (ref 0.61–1.24)
GFR calc non Af Amer: 14 mL/min — ABNORMAL LOW (ref >60)
GFR, EST AFRICAN AMERICAN: 16 mL/min — AB (ref >60)
GLUCOSE: 134 mg/dL — AB (ref 65–99)
Potassium: 4.3 mmol/L (ref 3.5–5.1)
SODIUM: 147 mmol/L — AB (ref 135–145)
Total Bilirubin: 1.3 mg/dL — ABNORMAL HIGH (ref 0.3–1.2)
Total Protein: 7.2 g/dL (ref 6.5–8.1)

## 2016-06-19 LAB — CBC
HEMATOCRIT: 33.3 % — AB (ref 39.0–52.0)
HEMOGLOBIN: 10.2 g/dL — AB (ref 13.0–17.0)
MCH: 27.9 pg (ref 26.0–34.0)
MCHC: 30.6 g/dL (ref 30.0–36.0)
MCV: 91.2 fL (ref 78.0–100.0)
Platelets: 609 10*3/uL — ABNORMAL HIGH (ref 150–400)
RBC: 3.65 MIL/uL — ABNORMAL LOW (ref 4.22–5.81)
RDW: 16.3 % — AB (ref 11.5–15.5)
WBC: 9.6 10*3/uL (ref 4.0–10.5)

## 2016-06-19 LAB — PHOSPHORUS: PHOSPHORUS: 6 mg/dL — AB (ref 2.5–4.6)

## 2016-06-19 LAB — HEPARIN LEVEL (UNFRACTIONATED): HEPARIN UNFRACTIONATED: 0.5 [IU]/mL (ref 0.30–0.70)
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
## 2016-06-19 NOTE — Progress Notes (Signed)
ANTICOAGULATION CONSULT NOTE  Pharmacy Consult for Heparin Indication: CVA  Patient Measurements: Height: 5\' 10"  (177.8 cm) Weight: 211 lb 13.8 oz (96.1 kg) IBW/kg (Calculated) : 73   Vital Signs: Temp: 98.5 F (36.9 C) (01/30 0329) Temp Source: Oral (01/30 0329) BP: 127/68 (01/30 0329) Pulse Rate: 75 (01/30 0329)   Recent Labs  06/17/16 0726 06/17/16 1121 06/18/16 0424 06/18/16 0919 06/18/16 1435 06/18/16 2031 06/19/16 0238  HGB 9.0*  --  9.0*  --   --   --  10.2*  HCT 29.6*  --  30.4*  --   --   --  33.3*  PLT 524*  --  547*  --   --   --  609*  HEPARINUNFRC  --  0.40 0.33  --   --   --  0.50  CREATININE 2.42*  --  3.99*  --   --   --  3.93*  TROPONINI  --   --   --  <0.03 0.03* 0.03*  --     Assessment: 76 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 fib, right ICA high-grade stenosis. Pt with grade 2 liver hematoma, L flank hematoma. 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 Continues on heparin gtt for CVA and Afib. CHADsVASC = 6.  Heparin level this morning remains therapeutic (HL 0.5 << 0 .33, goal of 0.3-0.5). Hgb 10.2, plts 609. No overt s/sx of bleeding noted.    Per neuro (on 1/26) noted plans to transition to apixaban once reliably taking PO and then consider elective carotid revascularization in 4-6 weeks  Goal of Therapy:  Heparin level 0.3-0.5 units/ml Monitor platelets by anticoagulation protocol: Yes   Plan:  1. Continue Heparin at 1700 units/hr (17 ml/hr) 2. Will continue to monitor for any signs/symptoms of bleeding and will follow up with heparin level in the a.m.   Thank you for allowing pharmacy to be a part of this patient's care.  Georgina PillionElizabeth Lynetta Tomczak, PharmD, BCPS Clinical Pharmacist Pager: [phone] Clinical phone for 06/19/2016 from 7a-3:30p: [phone] If after 3:30p, please call main pharmacy at: [phone] 06/19/2016 9:07 AM
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
## 2016-06-20 ENCOUNTER — Inpatient Hospital Stay (HOSPITAL_COMMUNITY): Payer: Medicare Other

## 2016-06-20 ENCOUNTER — Inpatient Hospital Stay (HOSPITAL_COMMUNITY)
Admission: AD | Admit: 2016-06-20 | Payer: Medicare Other | Source: Intra-hospital | Admitting: Physical Medicine & Rehabilitation

## 2016-06-20 ENCOUNTER — Encounter (HOSPITAL_COMMUNITY): Payer: Self-pay | Admitting: Physical Medicine and Rehabilitation

## 2016-06-20 ENCOUNTER — Inpatient Hospital Stay (HOSPITAL_COMMUNITY)
Admission: RE | Admit: 2016-06-20 | Discharge: 2016-06-29 | DRG: 057 | Disposition: A | Discharge status: Other Acute Inpatient Hospital | Payer: Medicare Other | Source: Intra-hospital | Attending: Physical Medicine & Rehabilitation | Admitting: Physical Medicine & Rehabilitation

## 2016-06-20 DIAGNOSIS — E669 Obesity, unspecified: Secondary | ICD-10-CM | POA: Diagnosis present

## 2016-06-20 DIAGNOSIS — K920 Hematemesis: Secondary | ICD-10-CM | POA: Diagnosis not present

## 2016-06-20 DIAGNOSIS — Z8669 Personal history of other diseases of the nervous system and sense organs: Secondary | ICD-10-CM | POA: Diagnosis not present

## 2016-06-20 DIAGNOSIS — I63139 Cerebral infarction due to embolism of unspecified carotid artery: Secondary | ICD-10-CM | POA: Diagnosis present

## 2016-06-20 DIAGNOSIS — N390 Urinary tract infection, site not specified: Secondary | ICD-10-CM | POA: Diagnosis present

## 2016-06-20 DIAGNOSIS — J90 Pleural effusion, not elsewhere classified: Secondary | ICD-10-CM | POA: Diagnosis present

## 2016-06-20 DIAGNOSIS — R319 Hematuria, unspecified: Secondary | ICD-10-CM

## 2016-06-20 DIAGNOSIS — R339 Retention of urine, unspecified: Secondary | ICD-10-CM | POA: Diagnosis not present

## 2016-06-20 DIAGNOSIS — N138 Other obstructive and reflux uropathy: Secondary | ICD-10-CM | POA: Diagnosis present

## 2016-06-20 DIAGNOSIS — I69319 Unspecified symptoms and signs involving cognitive functions following cerebral infarction: Secondary | ICD-10-CM

## 2016-06-20 DIAGNOSIS — Z8249 Family history of ischemic heart disease and other diseases of the circulatory system: Secondary | ICD-10-CM

## 2016-06-20 DIAGNOSIS — Z6828 Body mass index (BMI) 28.0-28.9, adult: Secondary | ICD-10-CM | POA: Diagnosis not present

## 2016-06-20 DIAGNOSIS — I5032 Chronic diastolic (congestive) heart failure: Secondary | ICD-10-CM | POA: Diagnosis present

## 2016-06-20 DIAGNOSIS — I5041 Acute combined systolic (congestive) and diastolic (congestive) heart failure: Secondary | ICD-10-CM | POA: Diagnosis not present

## 2016-06-20 DIAGNOSIS — K5900 Constipation, unspecified: Secondary | ICD-10-CM

## 2016-06-20 DIAGNOSIS — N179 Acute kidney failure, unspecified: Secondary | ICD-10-CM

## 2016-06-20 DIAGNOSIS — I6521 Occlusion and stenosis of right carotid artery: Secondary | ICD-10-CM | POA: Diagnosis present

## 2016-06-20 DIAGNOSIS — N189 Chronic kidney disease, unspecified: Secondary | ICD-10-CM | POA: Diagnosis present

## 2016-06-20 DIAGNOSIS — G20 Parkinson's disease: Secondary | ICD-10-CM | POA: Diagnosis present

## 2016-06-20 DIAGNOSIS — Z781 Physical restraint status: Secondary | ICD-10-CM

## 2016-06-20 DIAGNOSIS — S27329D Contusion of lung, unspecified, subsequent encounter: Secondary | ICD-10-CM | POA: Diagnosis not present

## 2016-06-20 DIAGNOSIS — R509 Fever, unspecified: Secondary | ICD-10-CM

## 2016-06-20 DIAGNOSIS — E870 Hyperosmolality and hypernatremia: Secondary | ICD-10-CM

## 2016-06-20 DIAGNOSIS — S2243XD Multiple fractures of ribs, bilateral, subsequent encounter for fracture with routine healing: Secondary | ICD-10-CM | POA: Diagnosis not present

## 2016-06-20 DIAGNOSIS — E860 Dehydration: Secondary | ICD-10-CM | POA: Diagnosis present

## 2016-06-20 DIAGNOSIS — S22089S Unspecified fracture of T11-T12 vertebra, sequela: Secondary | ICD-10-CM | POA: Diagnosis not present

## 2016-06-20 DIAGNOSIS — R310 Gross hematuria: Secondary | ICD-10-CM | POA: Diagnosis not present

## 2016-06-20 DIAGNOSIS — I639 Cerebral infarction, unspecified: Secondary | ICD-10-CM

## 2016-06-20 DIAGNOSIS — I480 Paroxysmal atrial fibrillation: Secondary | ICD-10-CM

## 2016-06-20 DIAGNOSIS — N401 Enlarged prostate with lower urinary tract symptoms: Secondary | ICD-10-CM | POA: Diagnosis present

## 2016-06-20 DIAGNOSIS — Z79899 Other long term (current) drug therapy: Secondary | ICD-10-CM

## 2016-06-20 DIAGNOSIS — R05 Cough: Secondary | ICD-10-CM

## 2016-06-20 DIAGNOSIS — E785 Hyperlipidemia, unspecified: Secondary | ICD-10-CM | POA: Diagnosis present

## 2016-06-20 DIAGNOSIS — Z951 Presence of aortocoronary bypass graft: Secondary | ICD-10-CM

## 2016-06-20 DIAGNOSIS — S32009A Unspecified fracture of unspecified lumbar vertebra, initial encounter for closed fracture: Secondary | ICD-10-CM

## 2016-06-20 DIAGNOSIS — K219 Gastro-esophageal reflux disease without esophagitis: Secondary | ICD-10-CM | POA: Diagnosis present

## 2016-06-20 DIAGNOSIS — I69318 Other symptoms and signs involving cognitive functions following cerebral infarction: Principal | ICD-10-CM

## 2016-06-20 DIAGNOSIS — N320 Bladder-neck obstruction: Secondary | ICD-10-CM | POA: Diagnosis present

## 2016-06-20 DIAGNOSIS — S22088D Other fracture of T11-T12 vertebra, subsequent encounter for fracture with routine healing: Secondary | ICD-10-CM

## 2016-06-20 DIAGNOSIS — R4189 Other symptoms and signs involving cognitive functions and awareness: Secondary | ICD-10-CM | POA: Diagnosis present

## 2016-06-20 DIAGNOSIS — I63131 Cerebral infarction due to embolism of right carotid artery: Secondary | ICD-10-CM

## 2016-06-20 DIAGNOSIS — S2241XS Multiple fractures of ribs, right side, sequela: Secondary | ICD-10-CM | POA: Diagnosis not present

## 2016-06-20 DIAGNOSIS — I4891 Unspecified atrial fibrillation: Secondary | ICD-10-CM

## 2016-06-20 DIAGNOSIS — S22089A Unspecified fracture of T11-T12 vertebra, initial encounter for closed fracture: Secondary | ICD-10-CM

## 2016-06-20 DIAGNOSIS — I509 Heart failure, unspecified: Secondary | ICD-10-CM

## 2016-06-20 DIAGNOSIS — R059 Cough, unspecified: Secondary | ICD-10-CM

## 2016-06-20 DIAGNOSIS — D62 Acute posthemorrhagic anemia: Secondary | ICD-10-CM

## 2016-06-20 DIAGNOSIS — Z4659 Encounter for fitting and adjustment of other gastrointestinal appliance and device: Secondary | ICD-10-CM

## 2016-06-20 DIAGNOSIS — Z7982 Long term (current) use of aspirin: Secondary | ICD-10-CM

## 2016-06-20 DIAGNOSIS — I2510 Atherosclerotic heart disease of native coronary artery without angina pectoris: Secondary | ICD-10-CM | POA: Diagnosis present

## 2016-06-20 LAB — GLUCOSE, CAPILLARY
GLUCOSE-CAPILLARY: 100 mg/dL — AB (ref 65–99)
GLUCOSE-CAPILLARY: 131 mg/dL — AB (ref 65–99)
GLUCOSE-CAPILLARY: 114 mg/dL — AB (ref 65–99)
Glucose-Capillary: 149 mg/dL — ABNORMAL HIGH (ref 65–99)
Glucose-Capillary: 97 mg/dL (ref 65–99)

## 2016-06-20 LAB — CBC
HCT: 32.1 % — ABNORMAL LOW (ref 39.0–52.0)
HCT: 33.6 % — ABNORMAL LOW (ref 39.0–52.0)
HEMOGLOBIN: 9.8 g/dL — AB (ref 13.0–17.0)
HEMOGLOBIN: 9.9 g/dL — AB (ref 13.0–17.0)
MCH: 28 pg (ref 26.0–34.0)
MCH: 27.3 pg (ref 26.0–34.0)
MCHC: 30.5 g/dL (ref 30.0–36.0)
MCHC: 29.5 g/dL — ABNORMAL LOW (ref 30.0–36.0)
MCV: 91.7 fL (ref 78.0–100.0)
MCV: 92.6 fL (ref 78.0–100.0)
PLATELETS: 584 10*3/uL — AB (ref 150–400)
PLATELETS: 575 10*3/uL — AB (ref 150–400)
RBC: 3.5 MIL/uL — AB (ref 4.22–5.81)
RBC: 3.63 MIL/uL — AB (ref 4.22–5.81)
RDW: 16.1 % — ABNORMAL HIGH (ref 11.5–15.5)
RDW: 15.9 % — ABNORMAL HIGH (ref 11.5–15.5)
WBC: 8.6 10*3/uL (ref 4.0–10.5)
WBC: 8.9 10*3/uL (ref 4.0–10.5)

## 2016-06-20 LAB — CREATININE, SERUM
CREATININE: 1.82 mg/dL — AB (ref 0.61–1.24)
GFR calc non Af Amer: 34 mL/min — ABNORMAL LOW (ref >60)
GFR, EST AFRICAN AMERICAN: 40 mL/min — AB (ref >60)

## 2016-06-20 LAB — RENAL FUNCTION PANEL
ANION GAP: 10 (ref 5–15)
Albumin: 2.7 g/dL — ABNORMAL LOW (ref 3.5–5.0)
BUN: 85 mg/dL — ABNORMAL HIGH (ref 6–20)
CO2: 29 mmol/L (ref 22–32)
CREATININE: 2.51 mg/dL — AB (ref 0.61–1.24)
Calcium: 8.6 mg/dL — ABNORMAL LOW (ref 8.9–10.3)
Chloride: 107 mmol/L (ref 101–111)
GFR calc non Af Amer: 23 mL/min — ABNORMAL LOW (ref >60)
GFR, EST AFRICAN AMERICAN: 27 mL/min — AB (ref >60)
Glucose, Bld: 120 mg/dL — ABNORMAL HIGH (ref 65–99)
Phosphorus: 4 mg/dL (ref 2.5–4.6)
Potassium: 3.8 mmol/L (ref 3.5–5.1)
SODIUM: 146 mmol/L — AB (ref 135–145)

## 2016-06-20 LAB — HEPARIN LEVEL (UNFRACTIONATED): Heparin Unfractionated: 0.5 IU/mL (ref 0.30–0.70)

## 2016-06-20 MED ORDER — PRO-STAT SUGAR FREE PO LIQD
30.0000 mL | Freq: Two times a day (BID) | ORAL | Status: DC
  Administered 2016-06-21 – 2016-06-26 (×10): 30 mL
  Filled 2016-06-20 (×10): qty 30

## 2016-06-20 MED ORDER — GUAIFENESIN 100 MG/5ML PO SOLN
15.0000 mL | Freq: Four times a day (QID) | ORAL | Status: DC
  Administered 2016-06-20 – 2016-06-25 (×15): 300 mg
  Filled 2016-06-20: qty 15
  Filled 2016-06-20: qty 25
  Filled 2016-06-20: qty 15
  Filled 2016-06-20: qty 5
  Filled 2016-06-20 (×2): qty 10
  Filled 2016-06-20 (×2): qty 15
  Filled 2016-06-20: qty 5
  Filled 2016-06-20: qty 15
  Filled 2016-06-20: qty 5
  Filled 2016-06-20: qty 10
  Filled 2016-06-20 (×4): qty 15

## 2016-06-20 MED ORDER — BISACODYL 10 MG RE SUPP
10.0000 mg | Freq: Every day | RECTAL | Status: DC | PRN
  Administered 2016-06-24 – 2016-06-27 (×2): 10 mg via RECTAL
  Filled 2016-06-20 (×2): qty 1

## 2016-06-20 MED ORDER — JEVITY 1.2 CAL PO LIQD
1000.0000 mL | ORAL | Status: DC
  Filled 2016-06-20 (×3): qty 1000

## 2016-06-20 MED ORDER — FREE WATER
200.0000 mL | Freq: Three times a day (TID) | Status: DC
  Administered 2016-06-20 – 2016-06-24 (×13): 200 mL

## 2016-06-20 MED ORDER — GUAIFENESIN-DM 100-10 MG/5ML PO SYRP: 5.0000 mL | ORAL_SOLUTION | Freq: Four times a day (QID) | ORAL | Status: DC | PRN

## 2016-06-20 MED ORDER — CARBIDOPA-LEVODOPA 25-100 MG PO TABS
1.5000 | ORAL_TABLET | Freq: Four times a day (QID) | ORAL | Status: DC
  Administered 2016-06-20 – 2016-06-29 (×31): 1.5
  Filled 2016-06-20 (×3): qty 2
  Filled 2016-06-20: qty 1
  Filled 2016-06-20 (×27): qty 2
  Filled 2016-06-20: qty 1
  Filled 2016-06-20: qty 2

## 2016-06-20 MED ORDER — ACETAMINOPHEN 325 MG PO TABS
325.0000 mg | ORAL_TABLET | ORAL | Status: DC | PRN
  Administered 2016-06-22: 650 mg
  Administered 2016-06-22: 325 mg
  Administered 2016-06-23 – 2016-06-25 (×2): 650 mg
  Filled 2016-06-20 (×4): qty 2

## 2016-06-20 MED ORDER — PANTOPRAZOLE SODIUM 40 MG PO PACK
40.0000 mg | PACK | Freq: Every day | ORAL | Status: DC
  Administered 2016-06-21 – 2016-06-29 (×8): 40 mg
  Filled 2016-06-20 (×7): qty 20

## 2016-06-20 MED ORDER — PROCHLORPERAZINE 25 MG RE SUPP: 12.5000 mg | Freq: Four times a day (QID) | RECTAL | Status: DC | PRN

## 2016-06-20 MED ORDER — TRAZODONE HCL 50 MG PO TABS
25.0000 mg | ORAL_TABLET | Freq: Every evening | ORAL | Status: DC | PRN
  Administered 2016-06-21: 50 mg
  Administered 2016-06-22 – 2016-06-23 (×2): 25 mg
  Filled 2016-06-20 (×4): qty 1

## 2016-06-20 MED ORDER — DOCUSATE SODIUM 50 MG/5ML PO LIQD
100.0000 mg | Freq: Two times a day (BID) | ORAL | Status: DC
  Administered 2016-06-21 – 2016-06-29 (×16): 100 mg
  Filled 2016-06-20 (×16): qty 10

## 2016-06-20 MED ORDER — HEPARIN SODIUM (PORCINE) 5000 UNIT/ML IJ SOLN: 5000.0000 [IU] | Freq: Three times a day (TID) | INTRAMUSCULAR | Status: DC

## 2016-06-20 MED ORDER — ALBUTEROL SULFATE (2.5 MG/3ML) 0.083% IN NEBU
2.5000 mg | INHALATION_SOLUTION | RESPIRATORY_TRACT | Status: DC | PRN
Start: 2016-06-20 — End: 2016-06-29

## 2016-06-20 MED ORDER — PROCHLORPERAZINE MALEATE 5 MG PO TABS: 5.0000 mg | ORAL_TABLET | Freq: Four times a day (QID) | ORAL | Status: DC | PRN

## 2016-06-20 MED ORDER — HYDROCODONE-ACETAMINOPHEN 7.5-325 MG/15ML PO SOLN
10.0000 mL | ORAL | Status: DC | PRN
  Administered 2016-06-20: 10 mL
  Filled 2016-06-20: qty 15

## 2016-06-20 MED ORDER — HYDROCODONE-ACETAMINOPHEN 7.5-325 MG/15ML PO SOLN
5.0000 mL | ORAL | Status: DC | PRN
  Administered 2016-06-21 – 2016-06-23 (×7): 5 mL
  Filled 2016-06-20 (×7): qty 15

## 2016-06-20 MED ORDER — ALUM & MAG HYDROXIDE-SIMETH 200-200-20 MG/5ML PO SUSP: 30.0000 mL | ORAL | Status: DC | PRN

## 2016-06-20 MED ORDER — POLYETHYLENE GLYCOL 3350 17 G PO PACK
17.0000 g | PACK | Freq: Every day | ORAL | Status: DC | PRN
Start: 2016-06-20 — End: 2016-06-26
  Administered 2016-06-21: 17 g
  Filled 2016-06-20: qty 1

## 2016-06-20 MED ORDER — FLEET ENEMA 7-19 GM/118ML RE ENEM: 1.0000 | ENEMA | Freq: Once | RECTAL | Status: DC | PRN

## 2016-06-20 MED ORDER — INSULIN ASPART 100 UNIT/ML ~~LOC~~ SOLN
0.0000 [IU] | SUBCUTANEOUS | Status: DC
  Administered 2016-06-22: 1 [IU] via SUBCUTANEOUS

## 2016-06-20 MED ORDER — ASPIRIN 325 MG PO TABS
325.0000 mg | ORAL_TABLET | Freq: Every day | ORAL | Status: DC
  Administered 2016-06-21 – 2016-06-29 (×9): 325 mg
  Filled 2016-06-20 (×9): qty 1

## 2016-06-20 MED ORDER — HEPARIN (PORCINE) IN NACL 100-0.45 UNIT/ML-% IJ SOLN
1450.0000 [IU]/h | INTRAMUSCULAR | Status: DC
  Administered 2016-06-21: 1650 [IU]/h via INTRAVENOUS
  Administered 2016-06-21: 1700 [IU]/h via INTRAVENOUS
  Administered 2016-06-22 – 2016-06-23 (×3): 1650 [IU]/h via INTRAVENOUS
  Administered 2016-06-25: 1550 [IU]/h via INTRAVENOUS
  Administered 2016-06-25: 1650 [IU]/h via INTRAVENOUS
  Administered 2016-06-26 – 2016-06-28 (×3): 1450 [IU]/h via INTRAVENOUS
  Filled 2016-06-20 (×13): qty 250

## 2016-06-20 MED ORDER — BISACODYL 10 MG RE SUPP: 10.0000 mg | Freq: Every day | RECTAL | Status: DC | PRN

## 2016-06-20 MED ORDER — METHOCARBAMOL 750 MG PO TABS: 750.0000 mg | ORAL_TABLET | Freq: Three times a day (TID) | ORAL | Status: DC | PRN

## 2016-06-20 MED ORDER — ORAL CARE MOUTH RINSE
15.0000 mL | Freq: Two times a day (BID) | OROMUCOSAL | Status: DC
  Administered 2016-06-21 – 2016-06-28 (×14): 15 mL via OROMUCOSAL

## 2016-06-20 MED ORDER — CIPROFLOXACIN IN D5W 200 MG/100ML IV SOLN
200.0000 mg | INTRAVENOUS | Status: DC
  Administered 2016-06-21 – 2016-06-22 (×2): 200 mg via INTRAVENOUS
  Filled 2016-06-20 (×2): qty 100

## 2016-06-20 MED ORDER — PROCHLORPERAZINE EDISYLATE 5 MG/ML IJ SOLN: 5.0000 mg | Freq: Four times a day (QID) | INTRAMUSCULAR | Status: DC | PRN

## 2016-06-20 MED ORDER — DIPHENHYDRAMINE HCL 12.5 MG/5ML PO ELIX: 12.5000 mg | ORAL_SOLUTION | Freq: Four times a day (QID) | ORAL | Status: DC | PRN

## 2016-06-20 MED ORDER — CHLORHEXIDINE GLUCONATE 0.12 % MT SOLN
15.0000 mL | Freq: Two times a day (BID) | OROMUCOSAL | Status: DC
  Administered 2016-06-20 – 2016-06-29 (×17): 15 mL via OROMUCOSAL
  Filled 2016-06-20 (×16): qty 15

## 2016-06-20 MED ORDER — IPRATROPIUM-ALBUTEROL 0.5-2.5 (3) MG/3ML IN SOLN
3.0000 mL | RESPIRATORY_TRACT | Status: DC | PRN
Start: 2016-06-20 — End: 2016-06-29
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
## 2016-06-20 NOTE — Progress Notes (Signed)
PHARMACY NOTE -  ANTIBIOTIC RENAL DOSE ADJUSTMENT   Patient has been initiated on Cipro 200mg  IV Q12H for UTI. SCr 3.51, estimated CrCl 28 ml/min   Plan: - Reduce Cipro to Q24H    Gianfranco Araki D. Laney Potashang, PharmD, BCPS Pager:  [phone] 06/20/2016, 4:34 PM
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
## 2016-06-20 NOTE — Progress Notes (Signed)
Discharged patient to [facility].  Receiving RN at bedside.  Belongings at bedside.

## 2016-06-20 NOTE — Progress Notes (Signed)
ANTICOAGULATION CONSULT NOTE  Pharmacy Consult for Heparin Indication: CVA  Patient Measurements: Height: 5\' 10"  (177.8 cm) Weight: 203 lb 4.2 oz (92.2 kg) IBW/kg (Calculated) : 73   Vital Signs: Temp: 98 F (36.7 C) (01/31 0700) Temp Source: Axillary (01/31 0700) BP: 100/66 (01/31 0700) Pulse Rate: 69 (01/31 0830)   Recent Labs  06/18/16 0424 06/18/16 0919 06/18/16 1435 06/18/16 2031 06/19/16 0238 06/20/16 0410  HGB 9.0*  --   --   --  10.2* 9.8*  HCT 30.4*  --   --   --  33.3* 32.1*  PLT 547*  --   --   --  609* 584*  HEPARINUNFRC 0.33  --   --   --  0.50 0.50  CREATININE 3.99*  --   --   --  3.93* 2.51*  TROPONINI  --  <0.03 0.03* 0.03*  --   --     Assessment: 76 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 fib, right ICA high-grade stenosis. Pt with grade 2 liver hematoma, L flank hematoma. 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 Continues on heparin gtt for CVA and Afib. CHADsVASC = 6.  Heparin level this morning remains therapeutic (HL 0.5 << 0 .5, goal of 0.3-0.5). Hgb 9.8, plts 584. No overt s/sx of bleeding noted.    Per neuro (on 1/26) noted plans to transition to apixaban once reliably taking PO and then consider elective carotid revascularization in 4-6 weeks  Goal of Therapy:  Heparin level 0.3-0.5 units/ml Monitor platelets by anticoagulation protocol: Yes   Plan:  1. Continue Heparin at 1700 units/hr (17 ml/hr) 2. Will continue to monitor for any signs/symptoms of bleeding and will follow up with heparin level in the a.m.   Thank you for allowing pharmacy to be a part of this patient's care.  Georgina PillionElizabeth Hema Lanza, PharmD, BCPS Clinical Pharmacist Pager: [phone] Clinical phone for 06/20/2016 from 7a-3:30p: [phone] If after 3:30p, please call main pharmacy at: [phone] 06/20/2016 9:10 AM
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
## 2016-06-20 NOTE — Progress Notes (Signed)
I have insurance approval to admit pt to inpt rehab today. I discussed with Dr. Lindie SpruceWyatt and son , [name] at bedside. I contacted pt's wife, Marylu LundJanet, by phone as well as daughter, [name] and they are I agreement. I will make the arrangements to admit today. [phone]
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
## 2016-06-20 NOTE — Progress Notes (Signed)
Marissa NestlePam Love PAC made aware unable to perform back X-Ray as pt. unable to stand stand.  Pt returned to room by transport. Found pt minutes later with mitten off and Cortrak tube out. Marissa NestlePam Love PAC aware and order placed for replacement.

## 2016-06-20 NOTE — Care Management Note (Signed)
Case Management Note  Patient Details  Name: Gregory [name] MRN: [id number] Date of Birth: [birth year]  Subjective/Objective:  Pt medically stable for discharge, and insurance approval received for admission to inpatient rehab today.                  Action/Plan: Plan dc to [facility] today.    Expected Discharge Date:     06/20/16             Expected Discharge Plan:  IP Rehab Facility  In-House Referral:     Discharge planning Services  CM Consult  Post Acute Care Choice:    Choice offered to:     DME Arranged:    DME Agency:     HH Arranged:    HH Agency:     Status of Service:  Completed, signed off  If discussed at [employer] Meetings, dates discussed:    Additional Comments:  Quintella BatonJulie W. Lavel Rieman, RN, BSN  Trauma/Neuro ICU Case Manager [phone]
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
## 2016-06-20 NOTE — Progress Notes (Signed)
Spoke with pt's daughter, Lowanda FosterBrittany, about her father transferring to rehab [phone]. Daughter reported that family was already aware that patient would be moving to rehab and just didn't know the time.  Daughter asked questions about if the patient's antibiotic would be covering the UTI, when the barium swallow will be, and overall status of her father. All questions were answered to the best of our ability. SLP stated that patient would be getting barium swallow while at rehab. Patient being transferred currently.
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
## 2016-06-21 ENCOUNTER — Inpatient Hospital Stay (HOSPITAL_COMMUNITY): Payer: Medicare Other

## 2016-06-21 ENCOUNTER — Inpatient Hospital Stay (HOSPITAL_COMMUNITY): Payer: Medicare Other | Admitting: Speech Pathology

## 2016-06-21 ENCOUNTER — Inpatient Hospital Stay (HOSPITAL_COMMUNITY): Payer: Medicare Other | Admitting: Physical Therapy

## 2016-06-21 ENCOUNTER — Inpatient Hospital Stay (HOSPITAL_COMMUNITY): Payer: Medicare Other | Admitting: Occupational Therapy

## 2016-06-21 DIAGNOSIS — I63131 Cerebral infarction due to embolism of right carotid artery: Secondary | ICD-10-CM

## 2016-06-21 DIAGNOSIS — Z8669 Personal history of other diseases of the nervous system and sense organs: Secondary | ICD-10-CM

## 2016-06-21 LAB — CBC WITH DIFFERENTIAL/PLATELET
Basophils Absolute: 0 10*3/uL (ref 0.0–0.1)
Basophils Relative: 1 %
EOS PCT: 6 %
Eosinophils Absolute: 0.5 10*3/uL (ref 0.0–0.7)
HEMATOCRIT: 33.6 % — AB (ref 39.0–52.0)
Hemoglobin: 10 g/dL — ABNORMAL LOW (ref 13.0–17.0)
LYMPHS ABS: 1.5 10*3/uL (ref 0.7–4.0)
LYMPHS PCT: 17 %
MCH: 28 pg (ref 26.0–34.0)
MCHC: 29.8 g/dL — ABNORMAL LOW (ref 30.0–36.0)
MCV: 94.1 fL (ref 78.0–100.0)
MONO ABS: 0.4 10*3/uL (ref 0.1–1.0)
Monocytes Relative: 5 %
Neutro Abs: 6.2 10*3/uL (ref 1.7–7.7)
Neutrophils Relative %: 71 %
PLATELETS: 570 10*3/uL — AB (ref 150–400)
RBC: 3.57 MIL/uL — AB (ref 4.22–5.81)
RDW: 16.2 % — ABNORMAL HIGH (ref 11.5–15.5)
WBC: 8.6 10*3/uL (ref 4.0–10.5)

## 2016-06-21 LAB — COMPREHENSIVE METABOLIC PANEL
ALT: 13 U/L — AB (ref 17–63)
AST: 23 U/L (ref 15–41)
Albumin: 2.9 g/dL — ABNORMAL LOW (ref 3.5–5.0)
Alkaline Phosphatase: 128 U/L — ABNORMAL HIGH (ref 38–126)
Anion gap: 10 (ref 5–15)
BILIRUBIN TOTAL: 0.9 mg/dL (ref 0.3–1.2)
BUN: 53 mg/dL — ABNORMAL HIGH (ref 6–20)
CALCIUM: 9.1 mg/dL (ref 8.9–10.3)
CHLORIDE: 115 mmol/L — AB (ref 101–111)
CO2: 25 mmol/L (ref 22–32)
CREATININE: 1.49 mg/dL — AB (ref 0.61–1.24)
GFR, EST AFRICAN AMERICAN: 51 mL/min — AB (ref >60)
GFR, EST NON AFRICAN AMERICAN: 44 mL/min — AB (ref >60)
Glucose, Bld: 91 mg/dL (ref 65–99)
Potassium: 3.8 mmol/L (ref 3.5–5.1)
Sodium: 150 mmol/L — ABNORMAL HIGH (ref 135–145)
TOTAL PROTEIN: 6.6 g/dL (ref 6.5–8.1)

## 2016-06-21 LAB — GLUCOSE, CAPILLARY
GLUCOSE-CAPILLARY: 92 mg/dL (ref 65–99)
Glucose-Capillary: 74 mg/dL (ref 65–99)
Glucose-Capillary: 71 mg/dL (ref 65–99)
Glucose-Capillary: 88 mg/dL (ref 65–99)
Glucose-Capillary: 111 mg/dL — ABNORMAL HIGH (ref 65–99)
Glucose-Capillary: 108 mg/dL — ABNORMAL HIGH (ref 65–99)

## 2016-06-21 LAB — HEPARIN LEVEL (UNFRACTIONATED): HEPARIN UNFRACTIONATED: 0.52 [IU]/mL (ref 0.30–0.70)

## 2016-06-21 MED ORDER — SODIUM CHLORIDE 0.45 % IV SOLN
INTRAVENOUS | Status: DC
  Administered 2016-06-21: 10:00:00 via INTRAVENOUS

## 2016-06-21 MED ORDER — NEPRO/CARBSTEADY PO LIQD: 60.0000 mL | ORAL | Status: DC

## 2016-06-21 MED ORDER — NEPRO/CARBSTEADY PO LIQD
1000.0000 mL | ORAL | Status: DC
  Administered 2016-06-21: 1000 mL via ORAL
  Filled 2016-06-21 (×2): qty 1000

## 2016-06-21 MED ORDER — NEPRO/CARBSTEADY PO LIQD
1000.0000 mL | ORAL | Status: DC
  Administered 2016-06-21 – 2016-06-25 (×5): 1000 mL via ORAL
  Filled 2016-06-21 (×7): qty 1000

## 2016-06-21 NOTE — Progress Notes (Signed)
Patient ID: Gregory LoronRichard Beverley, male   DOB: 27-Mar-1941, 76 y.o.   MRN: [id number] CKD3

## 2016-06-21 NOTE — Progress Notes (Signed)
Tube reinserted in pts right nare to 80 cm. Pt tolerated fairly well. Two therapists at bedside to assist and comfort pt. Thank you for your help. Xray has been ordered and assessment has been modified. Please keep Cortrak tube, it can be reinserted in the event that it becomes dislodged. Please contact Cortrak team with any questions or concerns.

## 2016-06-21 NOTE — Progress Notes (Signed)
ANTICOAGULATION CONSULT NOTE - Follow Up Consult  Pharmacy Consult for heparin Indication: CVA  No Known Allergies  Patient Measurements: Height: 5\' 11"  (180.3 cm) Weight: 212 lb 8.4 oz (96.4 kg) IBW/kg (Calculated) : 75.3  Vital Signs: Temp: 97.3 F (36.3 C) (02/01 0500) Temp Source: Axillary (02/01 0500) BP: 109/56 (02/01 0500) Pulse Rate: 62 (02/01 0500)  Labs:  Recent Labs  06/18/16 0919 06/18/16 1435 06/18/16 2031  06/19/16 0238 06/20/16 0410 06/20/16 1811 06/21/16 0507  HGB  --   --   --   < > 10.2* 9.8* 9.9* 10.0*  HCT  --   --   --   < > 33.3* 32.1* 33.6* 33.6*  PLT  --   --   --   < > 609* 584* 575* 570*  HEPARINUNFRC  --   --   --   --  0.50 0.50  --  0.52  CREATININE  --   --   --   < > 3.93* 2.51* 1.82* 1.49*  TROPONINI <0.03 0.03* 0.03*  --   --   --   --   --   < > = values in this interval not displayed.  Estimated Creatinine Clearance: 49.9 mL/min (by C-G formula based on SCr of 1.49 mg/dL (H)).   Medications:  Scheduled:  . aspirin  325 mg Per Tube Daily  . carbidopa-levodopa  1.5 tablet Per Tube QID  . chlorhexidine  15 mL Mouth Rinse BID  . ciprofloxacin  200 mg Intravenous Q24H  . docusate  100 mg Per Tube BID  . feeding supplement (PRO-STAT SUGAR FREE 64)  30 mL Per Tube BID  . free water  200 mL Per Tube TID WC & HS  . guaiFENesin  15 mL Per Tube Q6H  . insulin aspart  0-9 Units Subcutaneous Q4H  . mouth rinse  15 mL Mouth Rinse q12n4p  . pantoprazole sodium  40 mg Per Tube Daily   Infusions:  . feeding supplement (JEVITY 1.2 CAL)    . heparin 1,700 Units/hr (06/21/16 0302)    Assessment: 76 yo male with CVA and afib is currently on slightly supratherapeutic heparin.  Heparin level is 0.52 and CBC is stable.  Goal of Therapy:  Heparin level 0.3-0.5 units/ml Monitor platelets by anticoagulation protocol: Yes   Plan:  - Empirically reduce heparin to 1650 units/hr - Recheck heparin level in am  Davianna Deutschman, Tsz-Yin 06/21/2016,8:17  AM
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
## 2016-06-21 NOTE — Discharge Instructions (Signed)
Inpatient Rehab Discharge Instructions  Gregory [name] Discharge date and time:    Activities/Precautions/ Functional Status: Activity: activity as tolerated Diet:  Wound Care:   Functional status:  ___ No restrictions     ___ Walk up steps independently ___ 24/7 supervision/assistance   ___ Walk up steps with assistance ___ Intermittent supervision/assistance  ___ Bathe/dress independently ___ Walk with walker     ___ Bathe/dress with assistance ___ Walk Independently    ___ Shower independently ___ Walk with assistance    ___ Shower with assistance ___ No alcohol     ___ Return to work/school ________  Special Instructions:    STROKE/TIA DISCHARGE INSTRUCTIONS SMOKING Cigarette smoking nearly doubles your risk of having a stroke & is the single most alterable risk factor  If you smoke or have smoked in the last 12 months, you are advised to quit smoking for your health.  Most of the excess cardiovascular risk related to smoking disappears within a year of stopping.  Ask you doctor about anti-smoking medications  [place] Quit Line: 1-800-QUIT NOW  Free Smoking Cessation Classes [phone]  CHOLESTEROL Know your levels; limit fat & cholesterol in your diet  Lipid Panel     Component Value Date/Time   CHOL 105 06/08/2016 0915   TRIG 70 06/08/2016 0915   HDL 24 (L) 06/08/2016 0915   CHOLHDL 4.4 06/08/2016 0915   VLDL 14 06/08/2016 0915   LDLCALC 67 06/08/2016 0915      Many patients benefit from treatment even if their cholesterol is at goal.  Goal: Total Cholesterol (CHOL) less than 160  Goal:  Triglycerides (TRIG) less than 150  Goal:  HDL greater than 40  Goal:  LDL (LDLCALC) less than 100   BLOOD PRESSURE American Stroke Association blood pressure target is less that 120/80 mm/Hg  Your discharge blood pressure is:  BP: (!) 109/56  Monitor your blood pressure  Limit your salt and alcohol intake  Many individuals will require more than one medication for  high blood pressure  DIABETES (A1c is a blood sugar average for last 3 months) Goal HGBA1c is under 7% (HBGA1c is blood sugar average for last 3 months)  Diabetes: No known diagnosis of diabetes    Lab Results  Component Value Date   HGBA1C 4.8 06/08/2016     Your HGBA1c can be lowered with medications, healthy diet, and exercise.  Check your blood sugar as directed by your physician  Call your physician if you experience unexplained or low blood sugars.  PHYSICAL ACTIVITY/REHABILITATION Goal is 30 minutes at least 4 days per week  Activity: No driving, Therapies: See above Return to work: N/A  Activity decreases your risk of heart attack and stroke and makes your heart stronger.  It helps control your weight and blood pressure; helps you relax and can improve your mood.  Participate in a regular exercise program.  Talk with your doctor about the best form of exercise for you (dancing, walking, swimming, cycling).  DIET/WEIGHT Goal is to maintain a healthy weight  Your discharge diet is: Diet NPO time specified  liquids Your height is:  Height: 5\' 11"  (180.3 cm) Your current weight is: Weight: 96.4 kg (212 lb 8.4 oz) Your Body Mass Index (BMI) is:  BMI (Calculated): 29.7  Following the type of diet specifically designed for you will help prevent another stroke.  Your goal weight is: *  Your goal Body Mass Index (BMI) is 19-24.  Healthy food habits can help reduce 3 risk  factors for stroke:  High cholesterol, hypertension, and excess weight.  RESOURCES Stroke/Support Group:  Call [phone]   STROKE EDUCATION PROVIDED/REVIEWED AND GIVEN TO PATIENT Stroke warning signs and symptoms How to activate emergency medical system (call 911). Medications prescribed at discharge. Need for follow-up after discharge. Personal risk factors for stroke. Pneumonia vaccine given:  Flu vaccine given:  My questions have been answered, the writing is legible, and I understand these  instructions.  I will adhere to these goals & educational materials that have been provided to me after my discharge from the hospital.     My questions have been answered and I understand these instructions. I will adhere to these goals and the provided educational materials after my discharge from the hospital.  Patient/Caregiver Signature _______________________________ Date __________  Clinician Signature _______________________________________ Date __________  Please bring this form and your medication list with you to all your follow-up doctor's appointments.
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
## 2016-06-22 ENCOUNTER — Encounter (HOSPITAL_COMMUNITY): Payer: Self-pay

## 2016-06-22 ENCOUNTER — Inpatient Hospital Stay (HOSPITAL_COMMUNITY): Payer: Medicare Other | Admitting: Occupational Therapy

## 2016-06-22 ENCOUNTER — Inpatient Hospital Stay (HOSPITAL_COMMUNITY): Payer: Medicare Other | Admitting: Physical Therapy

## 2016-06-22 ENCOUNTER — Inpatient Hospital Stay (HOSPITAL_COMMUNITY): Payer: Medicare Other | Admitting: Speech Pathology

## 2016-06-22 DIAGNOSIS — I5041 Acute combined systolic (congestive) and diastolic (congestive) heart failure: Secondary | ICD-10-CM

## 2016-06-22 DIAGNOSIS — S22089S Unspecified fracture of T11-T12 vertebra, sequela: Secondary | ICD-10-CM

## 2016-06-22 DIAGNOSIS — J90 Pleural effusion, not elsewhere classified: Secondary | ICD-10-CM

## 2016-06-22 LAB — CBC
HCT: 37.7 % — ABNORMAL LOW (ref 39.0–52.0)
Hemoglobin: 11.2 g/dL — ABNORMAL LOW (ref 13.0–17.0)
MCH: 27.9 pg (ref 26.0–34.0)
MCHC: 29.7 g/dL — ABNORMAL LOW (ref 30.0–36.0)
MCV: 94 fL (ref 78.0–100.0)
Platelets: 606 10*3/uL — ABNORMAL HIGH (ref 150–400)
RBC: 4.01 MIL/uL — ABNORMAL LOW (ref 4.22–5.81)
RDW: 16.3 % — ABNORMAL HIGH (ref 11.5–15.5)
WBC: 10.1 10*3/uL (ref 4.0–10.5)

## 2016-06-22 LAB — GLUCOSE, CAPILLARY
GLUCOSE-CAPILLARY: 111 mg/dL — AB (ref 65–99)
Glucose-Capillary: 108 mg/dL — ABNORMAL HIGH (ref 65–99)
Glucose-Capillary: 109 mg/dL — ABNORMAL HIGH (ref 65–99)
Glucose-Capillary: 109 mg/dL — ABNORMAL HIGH (ref 65–99)
Glucose-Capillary: 120 mg/dL — ABNORMAL HIGH (ref 65–99)
Glucose-Capillary: 122 mg/dL — ABNORMAL HIGH (ref 65–99)
Glucose-Capillary: 127 mg/dL — ABNORMAL HIGH (ref 65–99)

## 2016-06-22 LAB — RENAL FUNCTION PANEL
Albumin: 2.9 g/dL — ABNORMAL LOW (ref 3.5–5.0)
Anion gap: 9 (ref 5–15)
BUN: 50 mg/dL — ABNORMAL HIGH (ref 6–20)
CO2: 24 mmol/L (ref 22–32)
Calcium: 9.3 mg/dL (ref 8.9–10.3)
Chloride: 116 mmol/L — ABNORMAL HIGH (ref 101–111)
Creatinine, Ser: 1.48 mg/dL — ABNORMAL HIGH (ref 0.61–1.24)
GFR calc Af Amer: 51 mL/min — ABNORMAL LOW (ref >60)
GFR calc non Af Amer: 44 mL/min — ABNORMAL LOW (ref >60)
Glucose, Bld: 124 mg/dL — ABNORMAL HIGH (ref 65–99)
Phosphorus: 3.3 mg/dL (ref 2.5–4.6)
Potassium: 3.9 mmol/L (ref 3.5–5.1)
Sodium: 149 mmol/L — ABNORMAL HIGH (ref 135–145)

## 2016-06-22 LAB — URINE CULTURE: Culture: NO GROWTH

## 2016-06-22 LAB — HEPARIN LEVEL (UNFRACTIONATED): HEPARIN UNFRACTIONATED: 0.49 [IU]/mL (ref 0.30–0.70)
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
## 2016-06-22 NOTE — Progress Notes (Signed)
Occupational Therapy Session Note  Patient Details  Name: Gregory [name] MRN: [id number] Date of Birth: 17-Jan-1941  Today's Date: 06/22/2016 OT Individual Time: 1100-1200 OT Individual Time Calculation (min): 60 min    Short Term Goals: Week 1:  OT Short Term Goal 1 (Week 1): Pt will tolerate sitting EOB for 5 mins to engage in grooming tasks OT Short Term Goal 2 (Week 1): Pt will complete bed mobility with max assist of 1 caregiver to engage in functional tasks OT Short Term Goal 3 (Week 1): Pt will engage in bathing at bed level with mod cues and max assist of one caregiver OT Short Term Goal 4 (Week 1): Pt will complete toilet transfers with mod assist of one caregiver   Skilled Therapeutic Interventions/Progress Updates:    Treatment session with focus on ADL retraining with bathing and dressing at sink and focus on activity tolerance, arousal, Lt attention, and following directions. Engaged in bathing seated at sink with pt demonstrating increased arousal and ability to follow one step commands with min multimodal cues.  +2 for sit > stand for LB dressing with pt able to stand with min-mod assist once upright and 2nd person pulled pants over hips.  Increased time with all tasks to allow pt to initiate movement.  Engaged in discussion regarding hobbies.  Completed grooming tasks with pt able to reach top of head to comb hair but unable to complete due to perceptual deficits and fatigue.  Oral care with suction brush with max cues for arousal and to open mouth as pt falling asleep.  Left tilted back in w/c with quick release belt donned.  Therapy Documentation Precautions:  Precautions Precautions: Back, Fall Precaution Comments: cortrak tube, fall (parkinson's and recent CVA) Required Braces or Orthoses: Spinal Brace Spinal Brace: Applied in supine position, Thoracolumbosacral orthotic Restrictions Weight Bearing Restrictions: No Pain:    See Function Navigator for Current  Functional Status.   Therapy/Group: Individual Therapy  Rosalio LoudHOXIE, Cayci Mcnabb 06/22/2016, 1:33 PM

## 2016-06-22 NOTE — Progress Notes (Signed)
[facility] Individual Statement of Services  Patient Name:  Gregory [name]  Date:  06/22/2016  Welcome to the [facility].  Our goal is to provide you with an individualized program based on your diagnosis and situation, designed to meet your specific needs.  With this comprehensive rehabilitation program, you will be expected to participate in at least 3 hours of rehabilitation therapies Monday-Friday, with modified therapy programming on the weekends.  Your rehabilitation program will include the following services:  Physical Therapy (PT), Occupational Therapy (OT), Speech Therapy (ST), 24 hour per day rehabilitation nursing, Neuropsychology, Case Management (Social Worker), Rehabilitation Medicine, Nutrition Services and Pharmacy Services  Weekly team conferences will be held on Wednesdays to discuss your progress.  Your Social Worker will talk with you frequently to get your input and to update you on team discussions.  Team conferences with you and your family in attendance may also be held.  Expected length of stay: 21 to 28 days  Overall anticipated outcome: Minimal assistance  Depending on your progress and recovery, your program may change. Your Social Worker will coordinate services and will keep you informed of any changes. Your Social Worker's name and contact numbers are listed  below.  The following services may also be recommended but are not provided by the [facility]:   Driving Evaluations  Home Health Rehabiltiation Services  Outpatient Rehabilitation Services   Arrangements will be made to provide these services after discharge if needed.  Arrangements include referral to agencies that provide these services.  Your insurance has been verified to be:  [employer] Your primary doctor is:  Dr. Darci NeedleWalter Kohut  Pertinent information will be shared with your doctor and your insurance  company.  Social Worker:  Staci AcostaJenny Scharlene Catalina, LCSW  [phone] or [phone]) [phone]  Information discussed with and copy given to patient by: Elvera LennoxPrevatt, Lashauna Arpin Capps, 06/22/2016, 12:36 PM

## 2016-06-22 NOTE — Progress Notes (Signed)
Physical Therapy Session Note  Patient Details  Name: Gregory [name] MRN: [id number] Date of Birth: 1941-02-05  Today's Date: 06/22/2016 PT Individual Time: 9191-6606 PT Individual Time Calculation (min): 22 min   Short Term Goals: Week 1:  PT Short Term Goal 1 (Week 1): Pt will demonstrate bed mobility with mod assist and use of bed features PT Short Term Goal 2 (Week 1): Pt will transfer with +1 assist consistently PT Short Term Goal 3 (Week 1): Pt will ambulate 30' with +2 assist PT Short Term Goal 4 (Week 1): Pt will propel w/c 20' wtih min assist  Skilled Therapeutic Interventions/Progress Updates:    no c/o pain.  Family prsent and inquiring about pt's progress.  Pt remains in chair from this AM session, discussed with family pt's current level of function, expected progress, goals, and plan of care.  Pt engaged in dynavision activity for visual scanning, upright posture, functional use of BUEs, sustained attention and following commands.  2 trials of mode A at 2 minutes, first trial 5 hits/24 sec reaction time, second trial 10 hits, 12 second reaction time.  Pt returned to room at end of session and remains in w/c with QRB in place, call bell in reach and needs met.   Therapy Documentation Precautions:  Precautions Precautions: Back, Fall Precaution Comments: cortrak tube, fall (parkinson's and recent CVA) Required Braces or Orthoses: Spinal Brace Spinal Brace: Applied in supine position, Thoracolumbosacral orthotic Restrictions Weight Bearing Restrictions: No   See Function Navigator for Current Functional Status.   Therapy/Group: Individual Therapy  Earnest Conroy Penven-Crew 06/22/2016, 5:24 PM
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
## 2016-06-22 NOTE — Progress Notes (Signed)
Speech Language Pathology Daily Session Note  Patient Details  Name: Gregory LoronRichard Cleckley MRN: [id number] Date of Birth: [birth year]  Today's Date: 06/22/2016 SLP Individual Time: 1400-1430 SLP Individual Time Calculation (min): 30 min  Short Term Goals: Week 1: SLP Short Term Goal 1 (Week 1): Patient will consume trials of ice chips without overt s/s of aspiration to assess readiness for repeat objective swallow assessment.  SLP Short Term Goal 2 (Week 1): Patient will increase speech intelligibility to ~75% at the phrase level with Mod A multimodal cues for use of speech intelligibility strategies.  SLP Short Term Goal 3 (Week 1): Patient will identify 2 physical and 2 cognitive deficits with Mod A mutimodal cues.  SLP Short Term Goal 4 (Week 1): Patient will demonstrate sustained attention to a functional task for 10 minutes with Mod A vebral cues for redirection.  SLP Short Term Goal 5 (Week 1): Patient will demonstrate functional problem solving for functional tasks with Mod A multimodal cues.   Skilled Therapeutic Interventions: Skilled treatment session focused on speech and dysphagia goals. SLP facilitated session by providing extensive and thorough oral care via the suction toothbrush. Patient consumed trials of ice chips with overt s/s of aspiration in 25% of trials, suspect due to a delayed swallow initiation. Patient demonstrated increased speech intelligibility this session (~75% at the phrase and sentence level) but demonstrated increased language of confusion. Patient's family present and educated on goals of skilled SLP intervention. All verbalized understanding. Patient left upright in wheelchair with family present. Continue with current lpan of care.      Function:  Eating Eating Eating activity did not occur: Safety/medical concerns Modified Consistency Diet: Yes (with trials from SLP ) Eating Assist Level: Helper feeds patient           Cognition Comprehension  Comprehension assist level: Understands basic 50 - 74% of the time/ requires cueing 25 - 49% of the time  Expression   Expression assist level: Expresses basic 25 - 49% of the time/requires cueing 50 - 75% of the time. Uses single words/gestures.  Social Interaction Social Interaction assist level: Interacts appropriately 25 - 49% of time - Needs frequent redirection.  Problem Solving Problem solving assist level: Solves basic 25 - 49% of the time - needs direction more than half the time to initiate, plan or complete simple activities  Memory Memory assist level: Recognizes or recalls 25 - 49% of the time/requires cueing 50 - 75% of the time    Pain Pain Assessment Pain Assessment: No/denies pain Faces Pain Scale: No hurt  Therapy/Group: Individual Therapy  Jurline Folger 06/22/2016, 3:56 PM
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
## 2016-06-22 NOTE — Progress Notes (Signed)
Physical Therapy Session Note  Patient Details  Name: Gregory [name] MRN: [id number] Date of Birth: 1940/10/26  Today's Date: 06/22/2016 PT Individual Time: 0900-1000 PT Individual Time Calculation (min): 60 min   Short Term Goals: Week 1:  PT Short Term Goal 1 (Week 1): Pt will demonstrate bed mobility with mod assist and use of bed features PT Short Term Goal 2 (Week 1): Pt will transfer with +1 assist consistently PT Short Term Goal 3 (Week 1): Pt will ambulate 30' with +2 assist PT Short Term Goal 4 (Week 1): Pt will propel w/c 20' wtih min assist  Skilled Therapeutic Interventions/Progress Updates:    No c/o pain, sleeping on arrival and takes increased time to wake.  Rolling total assist to L and R to don TLSO with max multimodal cues for sequencing.  Supine>sit with +2 assist to increase arousal with pt initially requiring total assist to maintain static sitting balance, fade to steady assist with multimodal cues for hand placement and weight shift to midline orientation.  +2 squat pivot to w/c on pt's R to facilitate forward weight shift and head/hips relationship.  Pt positioned upright in w/c with towel rolls to facilitate midline orientation, improved posture, and improved sitting tolerance. Pt left upright in w/c with call bell in reach, QRB in place, and needs met.   Therapy Documentation Precautions:  Precautions Precautions: Back, Fall Precaution Comments: cortrak tube, fall (parkinson's and recent CVA) Required Braces or Orthoses: Spinal Brace Spinal Brace: Applied in supine position, Thoracolumbosacral orthotic Restrictions Weight Bearing Restrictions: No   See Function Navigator for Current Functional Status.   Therapy/Group: Individual Therapy  Earnest Conroy Penven-Crew 06/22/2016, 12:14 PM

## 2016-06-22 NOTE — Progress Notes (Signed)
Subjective: Still feels very fatigued and tired. But awake and alert and responds appropriately. Complains of severe back pain.  Objective:  Vital Signs in the last 24 hours: Temp:  [98.2 F (36.8 C)-98.4 F (36.9 C)] 98.2 F (36.8 C) (02/02 1527) Pulse Rate:  [70-75] 70 (02/02 1527) Resp:  [18] 18 (02/02 0433) BP: (111-131)/(60-74) 131/60 (02/02 1527) SpO2:  [98 %-100 %] 100 % (02/02 1527) Weight:  [212 lb 4.9 oz (96.3 kg)] 212 lb 4.9 oz (96.3 kg) (02/02 0433)  Intake/Output from previous day: 02/01 0701 - 02/02 0700 In: 2348 [I.V.:398; NG/GT:1850; IV Piggyback:100] Out: 1914 [[id number] [Urine:1475; Emesis/NG output:500]  Physical Exam: Blood pressure 131/60, pulse 70, temperature 98.2 F (36.8 C), temperature source Oral, resp. rate 18, height 5\' 11"  (1.803 m), weight 212 lb 4.9 oz (96.3 kg), SpO2 100 %. Body mass index is 29.61 kg/m.  General appearance: alert, cooperative, appears stated age, fatigued and no distress Eyes: negative findings: lids and lashes normal Neck: no JVD, supple, symmetrical, trachea midline and thyroid not enlarged, symmetric, no tenderness/mass/nodules Neck: JVP - normal, carotids 2+= without bruits Resp: clear to auscultation bilaterally Chest wall: Limited exam due to presence of brace Cardio: regular rate and rhythm, S1, S2 normal, no murmur, click, rub or gallop GI: soft, non-tender; bowel sounds normal; no masses,  no organomegaly Extremities: extremities normal, atraumatic, no cyanosis or edema    Lab Results: BMP  Recent Labs  06/20/16 0410 06/20/16 1811 06/21/16 0507 06/22/16 1138  NA 146*  --  150* 149*  K 3.8  --  3.8 3.9  CL 107  --  115* 116*  CO2 29  --  25 24  GLUCOSE 120*  --  91 124*  BUN 85*  --  53* 50*  CREATININE 2.51* 1.82* 1.49* 1.48*  CALCIUM 8.6*  --  9.1 9.3  GFRNONAA 23* 34* 44* 44*  GFRAA 27* 40* 51* 51*    CBC  Recent Labs Lab 06/21/16 0507 06/22/16 1138  WBC 8.6 10.1  RBC 3.57* 4.01*  HGB 10.0* 11.2*  HCT  33.6* 37.7*  PLT 570* 606*  MCV 94.1 94.0  MCH 28.0 27.9  MCHC 29.8* 29.7*  RDW 16.2* 16.3*  LYMPHSABS 1.5  --   MONOABS 0.4  --   EOSABS 0.5  --   BASOSABS 0.0  --     HEMOGLOBIN A1C Lab Results  Component Value Date   HGBA1C 4.8 06/08/2016   MPG 91 06/08/2016    Cardiac Panel (last 3 results)  Recent Labs  06/18/16 0919 06/18/16 1435 06/18/16 2031  TROPONINI <0.03 0.03* 0.03*    TSH  Recent Labs  06/17/16 1429  TSH 6.936*    CHOLESTEROL  Recent Labs  06/08/16 0915  CHOL 105    Hepatic Function Panel  Recent Labs  06/04/16 0338 06/19/16 0238 06/20/16 0410 06/21/16 0507 06/22/16 1138  PROT 5.9* 7.2  --  6.6  --   ALBUMIN 2.9* 3.0* 2.7* 2.9* 2.9*  AST 48* 22  --  23  --   ALT 7* 8*  --  13*  --   ALKPHOS 52 145*  --  128*  --   BILITOT 1.5* 1.3*  --  0.9  --     Imaging: Imaging results have been reviewed  Cardiac Studies:  2D Echocardiogram /19/2018: - Left ventricle: The cavity size was normal. There was severeconcentric hypertrophy. Systolic function was vigorous. Theestimated ejection fraction was in the range of 65% to 70%. Wallmotion was normal; there  were no regional wall motionabnormalities. Features are consistent with a pseudonormal leftventricular filling pattern, with concomitant abnormal relaxationand increased filling pressure (grade 2 diastolic dysfunction). - Left atrium: The atrium was mildly dilated. - Tricuspid valve: There was mild regurgitation. - Pulmonary arteries: Systolic pressure was mildly increased. PApeak pressure: 39 mm Hg (S). Impressions: No cardiac source of emboli was indentified.  EKG 06/11/2016:Atrial fibrillation with controlled ventricular response at the rate of 81 bpm, left axis deviation, no evidence of ischemia. Normal QT interval. Compared to 06/01/2016, wandering atrial pacemaker has been replaced by atrial fibrillation.  Scheduled Meds: . aspirin  325 mg Per Tube Daily  .  carbidopa-levodopa  1.5 tablet Per Tube QID  . chlorhexidine  15 mL Mouth Rinse BID  . docusate  100 mg Per Tube BID  . feeding supplement (PRO-STAT SUGAR FREE 64)  30 mL Per Tube BID  . free water  200 mL Per Tube TID WC & HS  . guaiFENesin  15 mL Per Tube Q6H  . insulin aspart  0-9 Units Subcutaneous Q4H  . mouth rinse  15 mL Mouth Rinse q12n4p  . pantoprazole sodium  40 mg Per Tube Daily   Continuous Infusions: . feeding supplement (NEPRO CARB STEADY) 1,000 mL (06/22/16 0541)  . heparin 1,650 Units/hr (06/22/16 0926)   PRN Meds:.acetaminophen, albuterol, alum & mag hydroxide-simeth, bisacodyl, diphenhydrAMINE, guaiFENesin-dextromethorphan, HYDROcodone-acetaminophen, ipratropium-albuterol, methocarbamol, polyethylene glycol, prochlorperazine **OR** prochlorperazine **OR** prochlorperazine, sodium phosphate, traZODone   Assessment/Plan:  1. Paroxysmal atrial fibrillation, converted back to sinus rhythm about 4-5 days ago. CHA2DS2-VASCScore: Risk Score 6, Yearly risk of stroke 9.8. Recommendation: Anticoagulation. 2. Obstructive uropathy and acute renal failure now resolving. 3. 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 4. Chronic diastolic heart failure, now dehydrated, getting free fluid. Free water. Once obstructive uropathy has been dealt with, patient has had excellent diuresis, presently off of all diuretics. Anasarca is completely resolved. 5. Hyperlipidemia 6. Symptomatic right carotid artery stenosis, high-grade needs endarterectomy. 7. Parkinson's disease  Recommendation: Patient is presently on IV heparin, once he is able to tolerate oral feeds, would recommend switching him to Coumadin or Eliquis, and eventually will need carotid endarterectomy when he is clinically stable. Otherwise from cardiac standpoint he is doing well, I suspect he will recover to a meaningful life. Stable cardiac status and please  call if questions.   Yates Decamp, M.D. 06/22/2016, 4:22 PM [facility] Pager: [phone] Office: [phone] If no answer: [phone]

## 2016-06-22 NOTE — Progress Notes (Signed)
ANTICOAGULATION CONSULT NOTE - Follow Up Consult  Pharmacy Consult for heparin Indication: CVA   No Known Allergies  Patient Measurements: Height: 5\' 11"  (180.3 cm) Weight: 212 lb 4.9 oz (96.3 kg) IBW/kg (Calculated) : 75.3 Heparin Dosing Weight:   Vital Signs: Temp: 98.4 F (36.9 C) (02/02 0433) Temp Source: Oral (02/02 0433) BP: 111/74 (02/02 0433) Pulse Rate: 75 (02/02 0433)  Labs:  Recent Labs  06/20/16 0410 06/20/16 1811 06/21/16 0507  HGB 9.8* 9.9* 10.0*  HCT 32.1* 33.6* 33.6*  PLT 584* 575* 570*  HEPARINUNFRC 0.50  --  0.52  CREATININE 2.51* 1.82* 1.49*    Estimated Creatinine Clearance: 49.9 mL/min (by C-G formula based on SCr of 1.49 mg/dL (H)).   Medications:  Scheduled:  . aspirin  325 mg Per Tube Daily  . carbidopa-levodopa  1.5 tablet Per Tube QID  . chlorhexidine  15 mL Mouth Rinse BID  . ciprofloxacin  200 mg Intravenous Q24H  . docusate  100 mg Per Tube BID  . feeding supplement (PRO-STAT SUGAR FREE 64)  30 mL Per Tube BID  . free water  200 mL Per Tube TID WC & HS  . guaiFENesin  15 mL Per Tube Q6H  . insulin aspart  0-9 Units Subcutaneous Q4H  . mouth rinse  15 mL Mouth Rinse q12n4p  . pantoprazole sodium  40 mg Per Tube Daily   Infusions:  . feeding supplement (NEPRO CARB STEADY) 1,000 mL (06/22/16 0541)  . heparin 1,650 Units/hr (06/22/16 0600)    Assessment: 76 yo male with CVA is currently on therapuetic heparin.  Heparin level is 0.49.  CBC stable.  Goal of Therapy:  Heparin level 0.3-0.5 units/ml Monitor platelets by anticoagulation protocol: Yes   Plan:  - Continue heparin to 1650 units/hr - Daily heparin level - f/u transitioning to oral anticoag when po'ing  Blayklee Mable, Tsz-Yin 06/22/2016,8:27 AM

## 2016-06-22 NOTE — Progress Notes (Signed)
Occupational Therapy Note  Patient Details  Name: Gregory [name] MRN: [id number] Date of Birth: April 25, 1941  Today's Date: 06/22/2016 OT Individual Time: [id number] OT Individual Time Calculation (min): 21 min  and Today's Date: 06/22/2016 OT Missed Time: 39 Minutes Missed Time Reason: Patient fatigue  Attempted to see pt for scheduled OT treatment session with focus on self-care tasks and activity tolerance.  Pt sound asleep upon arrival, therapist turned on all lights and opened blinds.  Attempted turning on tv, wash cloth, and sternal rubs with pt maintaining sleep.  RN notified therapist that he had been awake on and off over night.  Unable to arouse to engage in any functional task. Will attempt later in the day.  Gregory [name], Gregory [name] 06/22/2016, 8:17 AM

## 2016-06-23 ENCOUNTER — Inpatient Hospital Stay (HOSPITAL_COMMUNITY): Payer: Medicare Other | Admitting: Occupational Therapy

## 2016-06-23 ENCOUNTER — Inpatient Hospital Stay (HOSPITAL_COMMUNITY): Payer: Medicare Other | Admitting: Speech Pathology

## 2016-06-23 ENCOUNTER — Inpatient Hospital Stay (HOSPITAL_COMMUNITY): Payer: Medicare Other | Admitting: Physical Therapy

## 2016-06-23 DIAGNOSIS — S2241XS Multiple fractures of ribs, right side, sequela: Secondary | ICD-10-CM

## 2016-06-23 LAB — RENAL FUNCTION PANEL
Albumin: 2.7 g/dL — ABNORMAL LOW (ref 3.5–5.0)
Anion gap: 9 (ref 5–15)
BUN: 43 mg/dL — ABNORMAL HIGH (ref 6–20)
CHLORIDE: 117 mmol/L — AB (ref 101–111)
CO2: 25 mmol/L (ref 22–32)
CREATININE: 1.15 mg/dL (ref 0.61–1.24)
Calcium: 9.2 mg/dL (ref 8.9–10.3)
GFR, EST NON AFRICAN AMERICAN: 60 mL/min — AB (ref >60)
Glucose, Bld: 114 mg/dL — ABNORMAL HIGH (ref 65–99)
POTASSIUM: 3.8 mmol/L (ref 3.5–5.1)
Phosphorus: 3 mg/dL (ref 2.5–4.6)
Sodium: 151 mmol/L — ABNORMAL HIGH (ref 135–145)

## 2016-06-23 LAB — GLUCOSE, CAPILLARY
GLUCOSE-CAPILLARY: 108 mg/dL — AB (ref 65–99)
GLUCOSE-CAPILLARY: 110 mg/dL — AB (ref 65–99)
GLUCOSE-CAPILLARY: 113 mg/dL — AB (ref 65–99)
GLUCOSE-CAPILLARY: 113 mg/dL — AB (ref 65–99)
Glucose-Capillary: 109 mg/dL — ABNORMAL HIGH (ref 65–99)
Glucose-Capillary: 119 mg/dL — ABNORMAL HIGH (ref 65–99)
Glucose-Capillary: 109 mg/dL — ABNORMAL HIGH (ref 65–99)

## 2016-06-23 LAB — HEPARIN LEVEL (UNFRACTIONATED): Heparin Unfractionated: 0.52 IU/mL (ref 0.30–0.70)

## 2016-06-23 LAB — CBC
HEMATOCRIT: 32.5 % — AB (ref 39.0–52.0)
Hemoglobin: 9.6 g/dL — ABNORMAL LOW (ref 13.0–17.0)
MCH: 27.7 pg (ref 26.0–34.0)
MCHC: 29.5 g/dL — ABNORMAL LOW (ref 30.0–36.0)
MCV: 93.7 fL (ref 78.0–100.0)
Platelets: 543 10*3/uL — ABNORMAL HIGH (ref 150–400)
RBC: 3.47 MIL/uL — AB (ref 4.22–5.81)
RDW: 16.4 % — ABNORMAL HIGH (ref 11.5–15.5)
WBC: 9.4 10*3/uL (ref 4.0–10.5)
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
## 2016-06-23 NOTE — Progress Notes (Signed)
Speech Language Pathology Daily Session Note  Patient Details  Name: Gregory [name] MRN: [id number] Date of Birth: 01/24/1941  Today's Date: 06/23/2016 SLP Individual Time: 1400-1430 SLP Individual Time Calculation (min): 30 min  Short Term Goals: Week 1: SLP Short Term Goal 1 (Week 1): Patient will consume trials of ice chips without overt s/s of aspiration to assess readiness for repeat objective swallow assessment.  SLP Short Term Goal 2 (Week 1): Patient will increase speech intelligibility to ~75% at the phrase level with Mod A multimodal cues for use of speech intelligibility strategies.  SLP Short Term Goal 3 (Week 1): Patient will identify 2 physical and 2 cognitive deficits with Mod A mutimodal cues.  SLP Short Term Goal 4 (Week 1): Patient will demonstrate sustained attention to a functional task for 10 minutes with Mod A vebral cues for redirection.  SLP Short Term Goal 5 (Week 1): Patient will demonstrate functional problem solving for functional tasks with Mod A multimodal cues.   Skilled Therapeutic Interventions: Skilled treatment session focused on addressing dysphagia goals. SLP facilitated session by providing set-up and Total assist for thorough oral care via suction toothbrush. Patient consumed trials of ice chips with overt s/s of aspiration in 10% of trials, on first and last trials.  Suspect due to a delayed swallow initiation and fatigue at end of session.  Continue with current plan of care.  Function:  Eating Eating   Modified Consistency Diet: Yes (ice chip trials with SLP) Eating Assist Level: Helper feeds patient           Cognition Comprehension Comprehension assist level: Understands basic 50 - 74% of the time/ requires cueing 25 - 49% of the time  Expression   Expression assist level: Expresses basic 25 - 49% of the time/requires cueing 50 - 75% of the time. Uses single words/gestures.  Social Interaction Social Interaction assist level: Interacts  appropriately 25 - 49% of time - Needs frequent redirection.  Problem Solving Problem solving assist level: Solves basic 25 - 49% of the time - needs direction more than half the time to initiate, plan or complete simple activities  Memory Memory assist level: Recognizes or recalls 25 - 49% of the time/requires cueing 50 - 75% of the time    Pain Pain Assessment Pain Assessment: No/denies pain  Therapy/Group: Individual Therapy  Gregory FerrettiMelissa Zandon [name], M.A., CCC-SLP [phone]  Gregory [name] 06/23/2016, 3:53 PM

## 2016-06-23 NOTE — Progress Notes (Signed)
Occupational Therapy Session Note  Patient Details  Name: Gregory LoronRichard Winner MRN: [id number] Date of Birth: 1940-12-31  Today's Date: 06/23/2016 OT Individual Time: 1100-1200 OT Individual Time Calculation (min): 60 min    Short Term Goals: Week 1:  OT Short Term Goal 1 (Week 1): Pt will tolerate sitting EOB for 5 mins to engage in grooming tasks OT Short Term Goal 2 (Week 1): Pt will complete bed mobility with max assist of 1 caregiver to engage in functional tasks OT Short Term Goal 3 (Week 1): Pt will engage in bathing at bed level with mod cues and max assist of one caregiver OT Short Term Goal 4 (Week 1): Pt will complete toilet transfers with mod assist of one caregiver   Skilled Therapeutic Interventions/Progress Updates: 1:1 Pt received from RN station. Participated in oral care with suction to better speech intelligibility and for hygiene with total A. Pt transitioned into the dayroom to address standing tolerance and balance and functional ambulation.  Pt able to doff blue socks with extra time and minA for figure 4 sitting position. Pt then able to don slippers (with back on them) with minA.  Pt able to stand in SARA well enough for a way to safely transfer pt. Also pt able to ambulate ~20 feet in sara with shuffling feet but with decr biasis  to one side.  Would be able to safely transition to less restrictive  device. Pt requested to use the bathroom. Stand pivot transfer with grab bar w/c<>toilet with mod A with more than reasonable amt of time. Pt unable to void in toilet but able to stand with steadying A from tech while 2nd person- OT perform hygiene to clean up after soiled breif. Pt able to transfer back into w/c but with max cuing for body awareness with pivoting and backing up towards chair. Washed hands at sink with minA with instructional cues. Left back with nursing at Rn station with wrist restraints and mits     Therapy Documentation Precautions:  Precautions Precautions:  Back, Fall Precaution Comments: cortrak tube, fall (parkinson's and recent CVA) Required Braces or Orthoses: Spinal Brace Spinal Brace: Applied in supine position, Thoracolumbosacral orthotic Restrictions Weight Bearing Restrictions: No Pain:  no indications of pain See Function Navigator for Current Functional Status.   Therapy/Group: Individual Therapy  Roney MansSmith, Camdyn Beske [facility] 06/23/2016, 2:19 PM

## 2016-06-23 NOTE — Progress Notes (Signed)
ANTICOAGULATION CONSULT NOTE - Follow Up Consult  Pharmacy Consult for heparin Indication: CVA /afib  No Known Allergies  Patient Measurements: Height: 5\' 11"  (180.3 cm) Weight: 201 lb 14.4 oz (91.6 kg) IBW/kg (Calculated) : 75.3  Vital Signs: Temp: 97.9 F (36.6 C) (02/03 0447) Temp Source: Oral (02/03 0447) BP: 121/56 (02/03 0447) Pulse Rate: 72 (02/03 0447)  Labs:  Recent Labs  06/21/16 0507 06/22/16 1138 06/23/16 0545  HGB 10.0* 11.2* 9.6*  HCT 33.6* 37.7* 32.5*  PLT 570* 606* 543*  HEPARINUNFRC 0.52 0.49 0.52  CREATININE 1.49* 1.48* 1.15    Estimated Creatinine Clearance: 63.2 mL/min (by C-G formula based on SCr of 1.15 mg/dL).   Medications:  Scheduled:  . aspirin  325 mg Per Tube Daily  . carbidopa-levodopa  1.5 tablet Per Tube QID  . chlorhexidine  15 mL Mouth Rinse BID  . docusate  100 mg Per Tube BID  . feeding supplement (PRO-STAT SUGAR FREE 64)  30 mL Per Tube BID  . free water  200 mL Per Tube TID WC & HS  . guaiFENesin  15 mL Per Tube Q6H  . insulin aspart  0-9 Units Subcutaneous Q4H  . mouth rinse  15 mL Mouth Rinse q12n4p  . pantoprazole sodium  40 mg Per Tube Daily   Infusions:  . feeding supplement (NEPRO CARB STEADY) 1,000 mL (06/23/16 0300)  . heparin 1,650 Units/hr (06/23/16 0215)    Assessment: 76 yo male with embolic CVA is currently on therapuetic heparin.  Heparin level is 0.52.  RN reports hematuria x3 days and Hgb noted to be downtrending. Monitor closely for s/s bleeding.   Goal of Therapy:  Heparin level 0.3-0.5 units/ml Monitor platelets by anticoagulation protocol: Yes   Plan:  - Continue heparin at 1650 units/hr - Daily heparin level and CBC  - f/u transitioning to oral anticoag when taking PO   York CeriseKatherine Cook, PharmD Pharmacy Resident  Pager [phone] 06/23/16 12:08 PM
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
## 2016-06-23 NOTE — Progress Notes (Signed)
Physical Therapy Session Note  Patient Details  Name: Gregory LoronRichard Joens MRN: [id number] Date of Birth: 1940/09/26  Today's Date: 06/23/2016 PT Individual Time: 0900-1000 PT Individual Time Calculation (min): 60 min   Short Term Goals: Week 1:  PT Short Term Goal 1 (Week 1): Pt will demonstrate bed mobility with mod assist and use of bed features PT Short Term Goal 2 (Week 1): Pt will transfer with +1 assist consistently PT Short Term Goal 3 (Week 1): Pt will ambulate 30' with +2 assist PT Short Term Goal 4 (Week 1): Pt will propel w/c 20' wtih min assist  Skilled Therapeutic Interventions/Progress Updates:  Pt was seen bedside in the am. Pt rolled R/L with side rails and total A. Pt dependent to don TLSO in supine. Pt transferred supine to edge of bed with total A. Pt transferred sit to stand with max A. Pt transferred edge of bed to w/c with max A and second person standby for safety. Pt transported to rehab gym. Treatment in gym focused on standing in parallel bars. Pt stood in parallel bars x 5 with mod to max A of 1. While standing treatment focused midline in standing, upright posture, weight shifting and stepping. Following treatment pt returned to nurses station with quick release belt, wrist restraints and mittens.   Therapy Documentation Precautions:  Precautions Precautions: Back, Fall Precaution Comments: cortrak tube, fall (parkinson's and recent CVA) Required Braces or Orthoses: Spinal Brace Spinal Brace: Applied in supine position, Thoracolumbosacral orthotic Restrictions Weight Bearing Restrictions: No General:   Pain: Pt c/o pain on R side of body.  See Function Navigator for Current Functional Status.   Therapy/Group: Individual Therapy  Rayford HalstedMitchell, Kaymen Adrian G 06/23/2016, 11:48 AM

## 2016-06-23 NOTE — Progress Notes (Signed)
Occupational Therapy Session Note  Patient Details  Name: Gregory [name] MRN: [id number] Date of Birth: 02/25/1941  Today's Date: 06/23/2016 OT Individual Time: 1449-1534 OT Individual Time Calculation (min): 45 min    Short Term Goals: Week 1:  OT Short Term Goal 1 (Week 1): Pt will tolerate sitting EOB for 5 mins to engage in grooming tasks OT Short Term Goal 2 (Week 1): Pt will complete bed mobility with max assist of 1 caregiver to engage in functional tasks OT Short Term Goal 3 (Week 1): Pt will engage in bathing at bed level with mod cues and max assist of one caregiver OT Short Term Goal 4 (Week 1): Pt will complete toilet transfers with mod assist of one caregiver   Skilled Therapeutic Interventions/Progress Updates: Pt was received from RN station and agreeable to tx. Per RN, pt reported needing to void but has been unsuccessful today. Pt nodded yes to try to attempt toileting. He was returned to room and completed stand step transfer with use of grab bars and Mod A. With extra time, pt was unable to void. Had pt attempt hygiene mgt when standing but began to wash therapist's pants with wash cloth. 2 helpers required for maintaining pts standing balance while completing hygiene/brief change. Pt transferred back to w/c in manner as written above. He was then taken to dayroom to complete standing activity to improve balance. Max A for sit<stand. Pt engaged in cognitive task of sorting items via color. Pt initially followed instruction, then shuffled items around with his left hand. Cues for redirection provided, and activity downgraded for pt to follow simple 1 step instructions to sort items. Pt stood for 6 minutes, strong left lean with fatigue. Afterwards pt was taken back to RN station, wrist restraints and hand mitts donned at time of departure.      Therapy Documentation Precautions:  Precautions Precautions: Back, Fall Precaution Comments: cortrak tube, fall (parkinson's and  recent CVA) Required Braces or Orthoses: Spinal Brace Spinal Brace: Applied in supine position, Thoracolumbosacral orthotic Restrictions Weight Bearing Restrictions: No General:   Vital Signs: Therapy Vitals Temp: 97.9 F (36.6 C) Temp Source: Axillary Pulse Rate: 77 Resp: 16 BP: (!) 121/50 Patient Position (if appropriate): Lying Oxygen Therapy SpO2: 98 % O2 Device: Not Delivered Pain: No c/o pain during session  Pain Assessment Pain Assessment: No/denies pain ADL:     See Function Navigator for Current Functional Status.   Therapy/Group: Individual Therapy  Gregory [name] 06/23/2016, 4:30 PM

## 2016-06-24 ENCOUNTER — Inpatient Hospital Stay (HOSPITAL_COMMUNITY): Payer: Medicare Other

## 2016-06-24 DIAGNOSIS — R310 Gross hematuria: Secondary | ICD-10-CM

## 2016-06-24 DIAGNOSIS — I480 Paroxysmal atrial fibrillation: Secondary | ICD-10-CM

## 2016-06-24 DIAGNOSIS — N179 Acute kidney failure, unspecified: Secondary | ICD-10-CM

## 2016-06-24 LAB — RENAL FUNCTION PANEL
Albumin: 2.7 g/dL — ABNORMAL LOW (ref 3.5–5.0)
Anion gap: 6 (ref 5–15)
BUN: 39 mg/dL — AB (ref 6–20)
CHLORIDE: 118 mmol/L — AB (ref 101–111)
CO2: 26 mmol/L (ref 22–32)
Calcium: 9.1 mg/dL (ref 8.9–10.3)
Creatinine, Ser: 1.07 mg/dL (ref 0.61–1.24)
GFR calc Af Amer: >60 mL/min (ref >60)
GFR calc non Af Amer: >60 mL/min (ref >60)
GLUCOSE: 109 mg/dL — AB (ref 65–99)
POTASSIUM: 3.6 mmol/L (ref 3.5–5.1)
Phosphorus: 2.8 mg/dL (ref 2.5–4.6)
Sodium: 150 mmol/L — ABNORMAL HIGH (ref 135–145)

## 2016-06-24 LAB — CBC
HEMATOCRIT: 33.8 % — AB (ref 39.0–52.0)
Hemoglobin: 9.9 g/dL — ABNORMAL LOW (ref 13.0–17.0)
MCH: 27.6 pg (ref 26.0–34.0)
MCHC: 29.3 g/dL — AB (ref 30.0–36.0)
MCV: 94.2 fL (ref 78.0–100.0)
PLATELETS: 534 10*3/uL — AB (ref 150–400)
RBC: 3.59 MIL/uL — ABNORMAL LOW (ref 4.22–5.81)
RDW: 16.3 % — AB (ref 11.5–15.5)
WBC: 12.7 10*3/uL — ABNORMAL HIGH (ref 4.0–10.5)

## 2016-06-24 LAB — GLUCOSE, CAPILLARY
GLUCOSE-CAPILLARY: 114 mg/dL — AB (ref 65–99)
Glucose-Capillary: 110 mg/dL — ABNORMAL HIGH (ref 65–99)
Glucose-Capillary: 110 mg/dL — ABNORMAL HIGH (ref 65–99)
Glucose-Capillary: 118 mg/dL — ABNORMAL HIGH (ref 65–99)
Glucose-Capillary: 91 mg/dL (ref 65–99)
Glucose-Capillary: 99 mg/dL (ref 65–99)

## 2016-06-24 LAB — HEPARIN LEVEL (UNFRACTIONATED): Heparin Unfractionated: 0.42 [IU]/mL (ref 0.30–0.70)

## 2016-06-24 MED ORDER — SODIUM CHLORIDE 0.45 % IV SOLN
INTRAVENOUS | Status: DC
  Administered 2016-06-24: 17:00:00 via INTRAVENOUS

## 2016-06-24 NOTE — Progress Notes (Signed)
ANTICOAGULATION CONSULT NOTE - Follow Up Consult  Pharmacy Consult for heparin Indication: CVA /afib  No Known Allergies  Patient Measurements: Height: 5\' 11"  (180.3 cm) Weight: 200 lb 6.4 oz (90.9 kg) IBW/kg (Calculated) : 75.3  Vital Signs: Temp: 97.8 F (36.6 C) (02/04 0451) Temp Source: Oral (02/04 0451) BP: 123/54 (02/04 0451) Pulse Rate: 75 (02/04 0451)  Labs:  Recent Labs  06/22/16 1138 06/23/16 0545 06/24/16 0354  HGB 11.2* 9.6* 9.9*  HCT 37.7* 32.5* 33.8*  PLT 606* 543* 534*  HEPARINUNFRC 0.49 0.52 0.42  CREATININE 1.48* 1.15 1.07    Estimated Creatinine Clearance: 67.7 mL/min (by C-G formula based on SCr of 1.07 mg/dL).   Medications:  Scheduled:  . aspirin  325 mg Per Tube Daily  . carbidopa-levodopa  1.5 tablet Per Tube QID  . chlorhexidine  15 mL Mouth Rinse BID  . docusate  100 mg Per Tube BID  . feeding supplement (PRO-STAT SUGAR FREE 64)  30 mL Per Tube BID  . free water  200 mL Per Tube TID WC & HS  . guaiFENesin  15 mL Per Tube Q6H  . insulin aspart  0-9 Units Subcutaneous Q4H  . mouth rinse  15 mL Mouth Rinse q12n4p  . pantoprazole sodium  40 mg Per Tube Daily   Infusions:  . feeding supplement (NEPRO CARB STEADY) 1,000 mL (06/24/16 0113)  . 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  Heparin level is 0.42.  RN reports hematuria x3-4 days and Hgb noted to be downtrending. Monitor closely for s/s bleeding.   Goal of Therapy:  Heparin level 0.3-0.5 units/ml Monitor platelets by anticoagulation protocol: Yes   Plan:  - Continue heparin at 1650 units/hr - Daily heparin level and CBC  - f/u transitioning to oral anticoag when taking PO   York CeriseKatherine Cook, PharmD Pharmacy Resident  Pager [phone] 06/24/16 9:44 AM

## 2016-06-24 NOTE — Progress Notes (Signed)
Restless at start of shift-removing mittens, moaning loudly, etc. Asked if in pain, mumbled "yes". PRN Hycet given. Foley draining cherry colored urine with sediment. Dalene SeltzerMade James, pharmacist aware, since patient on heparin. Slept from 2230-0500, moaning, but not opening eyes. Bilateral wrist restraints in place, to prevent patient from pulling at foley and panda tube. Alfredo MartinezMurray, Aishani Kalis A

## 2016-06-24 NOTE — Progress Notes (Signed)
Subjective/Complaints: Had a restless night. Continues to require restraints to avoid feeding tube removal. Nothing by mouth Still has evidence of hematuria  Review of systems difficult to obtain secondary to mental status as well as severe dysarthria  Objective: Vital Signs: Blood pressure (!) 123/54, pulse 75, temperature 97.8 F (36.6 C), temperature source Oral, resp. rate 18, height 5' 11" (1.803 m), weight 90.9 kg (200 lb 6.4 oz), SpO2 98 %. No results found. Results for orders placed or performed during the hospital encounter of 06/20/16 (from the past 72 hour(s))  Urine culture     Status: None   Collection Time: 06/21/16 12:13 PM  Result Value Ref Range   Specimen Description URINE, CATHETERIZED    Special Requests PATIENT ON FOLLOWING CIPRO    Culture NO GROWTH    Report Status 06/22/2016 FINAL   Glucose, capillary     Status: None   Collection Time: 06/21/16  1:38 PM  Result Value Ref Range   Glucose-Capillary 88 65 - 99 mg/dL  Glucose, capillary     Status: Abnormal   Collection Time: 06/21/16  4:34 PM  Result Value Ref Range   Glucose-Capillary 111 (H) 65 - 99 mg/dL  Glucose, capillary     Status: Abnormal   Collection Time: 06/21/16  7:30 PM  Result Value Ref Range   Glucose-Capillary 108 (H) 65 - 99 mg/dL  Glucose, capillary     Status: Abnormal   Collection Time: 06/22/16 12:16 AM  Result Value Ref Range   Glucose-Capillary 108 (H) 65 - 99 mg/dL  Glucose, capillary     Status: Abnormal   Collection Time: 06/22/16  4:22 AM  Result Value Ref Range   Glucose-Capillary 111 (H) 65 - 99 mg/dL  Glucose, capillary     Status: Abnormal   Collection Time: 06/22/16  8:41 AM  Result Value Ref Range   Glucose-Capillary 109 (H) 65 - 99 mg/dL  Heparin level (unfractionated)     Status: None   Collection Time: 06/22/16 11:38 AM  Result Value Ref Range   Heparin Unfractionated 0.49 0.30 - 0.70 IU/mL    Comment:        IF HEPARIN RESULTS ARE BELOW EXPECTED VALUES,  AND PATIENT DOSAGE HAS BEEN CONFIRMED, SUGGEST FOLLOW UP TESTING OF ANTITHROMBIN III LEVELS.   CBC     Status: Abnormal   Collection Time: 06/22/16 11:38 AM  Result Value Ref Range   WBC 10.1 4.0 - 10.5 K/uL   RBC 4.01 (L) 4.22 - 5.81 MIL/uL   Hemoglobin 11.2 (L) 13.0 - 17.0 g/dL   HCT 37.7 (L) 39.0 - 52.0 %   MCV 94.0 78.0 - 100.0 fL   MCH 27.9 26.0 - 34.0 pg   MCHC 29.7 (L) 30.0 - 36.0 g/dL   RDW 16.3 (H) 11.5 - 15.5 %   Platelets 606 (H) 150 - 400 K/uL  Renal function panel     Status: Abnormal   Collection Time: 06/22/16 11:38 AM  Result Value Ref Range   Sodium 149 (H) 135 - 145 mmol/L   Potassium 3.9 3.5 - 5.1 mmol/L   Chloride 116 (H) 101 - 111 mmol/L   CO2 24 22 - 32 mmol/L   Glucose, Bld 124 (H) 65 - 99 mg/dL   BUN 50 (H) 6 - 20 mg/dL   Creatinine, Ser 1.48 (H) 0.61 - 1.24 mg/dL   Calcium 9.3 8.9 - 10.3 mg/dL   Phosphorus 3.3 2.5 - 4.6 mg/dL   Albumin 2.9 (L) 3.5 -  5.0 g/dL   GFR calc non Af Amer 44 (L) >60 mL/min   GFR calc Af Amer 51 (L) >60 mL/min    Comment: (NOTE) The eGFR has been calculated using the CKD EPI equation. This calculation has not been validated in all clinical situations. eGFR's persistently <60 mL/min signify possible Chronic Kidney Disease.    Anion gap 9 5 - 15  Glucose, capillary     Status: Abnormal   Collection Time: 06/22/16 12:02 PM  Result Value Ref Range   Glucose-Capillary 127 (H) 65 - 99 mg/dL  Glucose, capillary     Status: Abnormal   Collection Time: 06/22/16  4:33 PM  Result Value Ref Range   Glucose-Capillary 109 (H) 65 - 99 mg/dL  Glucose, capillary     Status: Abnormal   Collection Time: 06/22/16  8:14 PM  Result Value Ref Range   Glucose-Capillary 120 (H) 65 - 99 mg/dL  Glucose, capillary     Status: Abnormal   Collection Time: 06/22/16 11:50 PM  Result Value Ref Range   Glucose-Capillary 122 (H) 65 - 99 mg/dL  Glucose, capillary     Status: Abnormal   Collection Time: 06/23/16 12:07 AM  Result Value Ref Range    Glucose-Capillary 110 (H) 65 - 99 mg/dL  Glucose, capillary     Status: Abnormal   Collection Time: 06/23/16  3:59 AM  Result Value Ref Range   Glucose-Capillary 109 (H) 65 - 99 mg/dL  Heparin level (unfractionated)     Status: None   Collection Time: 06/23/16  5:45 AM  Result Value Ref Range   Heparin Unfractionated 0.52 0.30 - 0.70 IU/mL    Comment:        IF HEPARIN RESULTS ARE BELOW EXPECTED VALUES, AND PATIENT DOSAGE HAS BEEN CONFIRMED, SUGGEST FOLLOW UP TESTING OF ANTITHROMBIN III LEVELS.   CBC     Status: Abnormal   Collection Time: 06/23/16  5:45 AM  Result Value Ref Range   WBC 9.4 4.0 - 10.5 K/uL   RBC 3.47 (L) 4.22 - 5.81 MIL/uL   Hemoglobin 9.6 (L) 13.0 - 17.0 g/dL   HCT 32.5 (L) 39.0 - 52.0 %   MCV 93.7 78.0 - 100.0 fL   MCH 27.7 26.0 - 34.0 pg   MCHC 29.5 (L) 30.0 - 36.0 g/dL   RDW 16.4 (H) 11.5 - 15.5 %   Platelets 543 (H) 150 - 400 K/uL  Renal function panel     Status: Abnormal   Collection Time: 06/23/16  5:45 AM  Result Value Ref Range   Sodium 151 (H) 135 - 145 mmol/L   Potassium 3.8 3.5 - 5.1 mmol/L   Chloride 117 (H) 101 - 111 mmol/L   CO2 25 22 - 32 mmol/L   Glucose, Bld 114 (H) 65 - 99 mg/dL   BUN 43 (H) 6 - 20 mg/dL   Creatinine, Ser 1.15 0.61 - 1.24 mg/dL   Calcium 9.2 8.9 - 10.3 mg/dL   Phosphorus 3.0 2.5 - 4.6 mg/dL   Albumin 2.7 (L) 3.5 - 5.0 g/dL   GFR calc non Af Amer 60 (L) >60 mL/min   GFR calc Af Amer >60 >60 mL/min    Comment: (NOTE) The eGFR has been calculated using the CKD EPI equation. This calculation has not been validated in all clinical situations. eGFR's persistently <60 mL/min signify possible Chronic Kidney Disease.    Anion gap 9 5 - 15  Glucose, capillary     Status: Abnormal   Collection Time:  06/23/16  8:45 AM  Result Value Ref Range   Glucose-Capillary 113 (H) 65 - 99 mg/dL  Glucose, capillary     Status: Abnormal   Collection Time: 06/23/16  9:14 AM  Result Value Ref Range   Glucose-Capillary 119 (H) 65 - 99  mg/dL  Glucose, capillary     Status: Abnormal   Collection Time: 06/23/16 11:38 AM  Result Value Ref Range   Glucose-Capillary 108 (H) 65 - 99 mg/dL  Glucose, capillary     Status: Abnormal   Collection Time: 06/23/16  4:39 PM  Result Value Ref Range   Glucose-Capillary 113 (H) 65 - 99 mg/dL  Glucose, capillary     Status: Abnormal   Collection Time: 06/23/16  8:13 PM  Result Value Ref Range   Glucose-Capillary 109 (H) 65 - 99 mg/dL  Glucose, capillary     Status: Abnormal   Collection Time: 06/24/16 12:58 AM  Result Value Ref Range   Glucose-Capillary 118 (H) 65 - 99 mg/dL  Heparin level (unfractionated)     Status: None   Collection Time: 06/24/16  3:54 AM  Result Value Ref Range   Heparin Unfractionated 0.42 0.30 - 0.70 IU/mL    Comment:        IF HEPARIN RESULTS ARE BELOW EXPECTED VALUES, AND PATIENT DOSAGE HAS BEEN CONFIRMED, SUGGEST FOLLOW UP TESTING OF ANTITHROMBIN III LEVELS.   CBC     Status: Abnormal   Collection Time: 06/24/16  3:54 AM  Result Value Ref Range   WBC 12.7 (H) 4.0 - 10.5 K/uL   RBC 3.59 (L) 4.22 - 5.81 MIL/uL   Hemoglobin 9.9 (L) 13.0 - 17.0 g/dL   HCT 33.8 (L) 39.0 - 52.0 %   MCV 94.2 78.0 - 100.0 fL   MCH 27.6 26.0 - 34.0 pg   MCHC 29.3 (L) 30.0 - 36.0 g/dL   RDW 16.3 (H) 11.5 - 15.5 %   Platelets 534 (H) 150 - 400 K/uL  Renal function panel     Status: Abnormal   Collection Time: 06/24/16  3:54 AM  Result Value Ref Range   Sodium 150 (H) 135 - 145 mmol/L   Potassium 3.6 3.5 - 5.1 mmol/L   Chloride 118 (H) 101 - 111 mmol/L   CO2 26 22 - 32 mmol/L   Glucose, Bld 109 (H) 65 - 99 mg/dL   BUN 39 (H) 6 - 20 mg/dL   Creatinine, Ser 1.07 0.61 - 1.24 mg/dL   Calcium 9.1 8.9 - 10.3 mg/dL   Phosphorus 2.8 2.5 - 4.6 mg/dL   Albumin 2.7 (L) 3.5 - 5.0 g/dL   GFR calc non Af Amer >60 >60 mL/min   GFR calc Af Amer >60 >60 mL/min    Comment: (NOTE) The eGFR has been calculated using the CKD EPI equation. This calculation has not been validated in  all clinical situations. eGFR's persistently <60 mL/min signify possible Chronic Kidney Disease.    Anion gap 6 5 - 15  Glucose, capillary     Status: Abnormal   Collection Time: 06/24/16  4:14 AM  Result Value Ref Range   Glucose-Capillary 110 (H) 65 - 99 mg/dL  Glucose, capillary     Status: Abnormal   Collection Time: 06/24/16  8:12 AM  Result Value Ref Range   Glucose-Capillary 110 (H) 65 - 99 mg/dL      General: No acute distress Mood and affect are appropriate Heart: Irregularly irregular Lungs: Clear to auscultation, breathing unlabored, no rales or wheezes Abdomen:  Positive bowel sounds, soft nontender to palpation, nondistended Extremities: No clubbing, cyanosis, or edema Skin: No evidence of breakdown, no evidence of rash Neurologic: Cranial nerves II through XII intact, motor strength is 4/5 in bilateral deltoid, bicep, tricep, grip, hip flexor, knee extensors, ankle dorsiflexor and plantar flexor Sensory exam normal sensation to light touch and proprioception in bilateral upper and lower extremities Movements are slow. Needs encouragement and cueing Musculoskeletal: No pain with upper or lower extremity muscle testing.   Assessment/Plan: 1. 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 which require 3+ hours per day of interdisciplinary therapy in a comprehensive inpatient rehab setting. Physiatrist is providing close team supervision and 24 hour management of active medical problems listed below. Physiatrist and rehab team continue to assess barriers to discharge/monitor patient progress toward functional and medical goals. FIM: Function - Bathing Position: Wheelchair/chair at sink Body parts bathed by patient: Right arm, Left arm, Chest, Abdomen, Right upper leg Body parts bathed by helper: Front perineal area, Buttocks, Left upper leg, Right lower leg, Left lower leg, Back Assist Level: 2  helpers  Function- Upper Body Dressing/Undressing What is the patient wearing?: Hospital gown, Orthosis Orthosis activity level: 2 helpers Assist Level: 2 helpers Function - Lower Armed forces technical officer What is the patient wearing?: Pants, Non-skid slipper socks Position: Wheelchair/chair at sink Pants- Performed by helper: Thread/unthread right pants leg, Thread/unthread left pants leg, Pull pants up/down Non-skid slipper socks- Performed by helper: Don/doff right sock, Don/doff left sock Assist for footwear: Dependant Assist for lower body dressing: 2 Helpers  Function - Toileting Toileting steps completed by helper: Adjust clothing prior to toileting, Performs perineal hygiene, Adjust clothing after toileting Toileting Assistive Devices: Grab bar or rail Assist level: Two helpers  Function - Toilet Transfers Assist level to toilet: Moderate assist (Pt 50 - 74%/lift or lower) Assist level from toilet: Moderate assist (Pt 50 - 74%/lift or lower)  Function - Chair/bed transfer Chair/bed transfer method: Stand pivot Chair/bed transfer assist level: 2 helpers Chair/bed transfer details: Tactile cues for initiation, Tactile cues for posture, Tactile cues for weight shifting, Verbal cues for safe use of DME/AE, Verbal cues for technique, Verbal cues for precautions/safety, Manual facilitation for weight shifting, Manual facilitation for placement  Function - Locomotion: Wheelchair Will patient use wheelchair at discharge?: Yes Type: Manual Max wheelchair distance: 150 Assist Level: Dependent (Pt equals 0%) Assist Level: Dependent (Pt equals 0%) Assist Level: Dependent (Pt equals 0%) Turns around,maneuvers to table,bed, and toilet,negotiates 3% grade,maneuvers on rugs and over doorsills: No Function - Locomotion: Ambulation Assistive device: Rail in hallway Max distance: 8 Assist level: 2 helpers Walk 10 feet activity did not occur: Safety/medical concerns Walk 50 feet with 2  turns activity did not occur: Safety/medical concerns Walk 150 feet activity did not occur: Safety/medical concerns Walk 10 feet on uneven surfaces activity did not occur: Safety/medical concerns  Function - Comprehension Comprehension: Auditory Comprehension assist level: Understands basic 50 - 74% of the time/ requires cueing 25 - 49% of the time  Function - Expression Expression: Verbal Expression assist level: Expresses basic 25 - 49% of the time/requires cueing 50 - 75% of the time. Uses single words/gestures.  Function - Social Interaction Social Interaction assist level: Interacts appropriately 25 - 49% of time - Needs frequent redirection.  Function - Problem Solving Problem solving assist level: Solves basic 25 - 49% of the time - needs direction more than half the time to initiate, plan or complete  simple activities  Function - Memory Memory assist level: Recognizes or recalls 25 - 49% of the time/requires cueing 50 - 75% of the time Patient normally able to recall (first 3 days only): Current season, That he or she is in a hospital  Medical Problem List and Plan: 1. Cognitive deficits, weakness, limitations in self-care secondary to right CVA.Multitrauma is major limiting factor with mobility CIR PT, OT, speech, participation fluctuates with mental status 2. DVT Prophylaxis/Anticoagulation: Pharmaceutical: Heparin, until taking by mouth 3. Pain Management: tylenol prn 4. Mood: Hard to determine at this time due cognitive impairments. LCSW to follow along for evaluation as appropriate.  5. Neuropsych: This patient is not capable of making decisions on his own behalf. 6. Skin/Wound Care: routine pressure relief measures.  7. Fluids/Electrolytes/Nutrition: Add water flushes to help with hydration.  8. Parkinsonism:On sinemet qid.  9. PAF: Now in NSR and controlled at this time. Monitor HR bid. Continue heparin for now--Eliquis once able to start po intake.  10. Acute  renal injury: Due to BOO. Continue foley for monitoring I/O. Renal status improving  11. Acute Congestive heart failure: Monitor daily weights--down to 203 lbs. Monitor for signs of overload. 12. UTI: Completed Cipro, rechecked UA +, C&S, negative 13. Hematuria: Continue to monitor output for now. Hemoglobins have been in the 9-10 range. One value of 11, which was spurious 14. Hypernatremia: Will recheck in am. Will add water flushes.  15. T12 fracture. Stable, per neurosurgery, may don and doff orthosis while seated on bed, shower with orthosis off  LOS (Days) 4 A FACE TO FACE EVALUATION WAS PERFORMED  Nesha Counihan E 06/24/2016, 11:23 AM

## 2016-06-24 NOTE — Progress Notes (Signed)
Call to room after therapy, NG tube appeared longer than in morning. Auscultated air in mid chest, MD notified. Orders given to remove tube, request cor-track team place new NG 2/5. Hold meds, start 0.45NS at 50 mL/hr. Family at bedside, questions answered.

## 2016-06-24 NOTE — Progress Notes (Signed)
Occupational Therapy Session Note  Patient Details  Name: Gregory [name] MRN: [id number] Date of Birth: [birth year]  Today's Date: 06/24/2016 OT Individual Time: 1300-1345 OT Individual Time Calculation (min): 45 min   Short Term Goals: Week 1:  OT Short Term Goal 1 (Week 1): Pt will tolerate sitting EOB for 5 mins to engage in grooming tasks OT Short Term Goal 2 (Week 1): Pt will complete bed mobility with max assist of 1 caregiver to engage in functional tasks OT Short Term Goal 3 (Week 1): Pt will engage in bathing at bed level with mod cues and max assist of one caregiver OT Short Term Goal 4 (Week 1): Pt will complete toilet transfers with mod assist of one caregiver   Skilled Therapeutic Interventions/Progress Updates: ADL-retraining with focus on bed mobility, static sitting balance, transfers, and seated grooming at sink.   Pt received from RN who provided suppository 20 min prior to session and requested assist with upright positioning.   Family present with patient in room.   Pt very lethargic requiring max stimulation to attend to direction and open his eyes during session although improving by end of session.   Pt required max assist with manual facilitation for placement and weight-shifting to roll to his left, rise to EOB and perform stand-pivot transfer to w/c; +2 assist standing by for safety and steadying assist although not required for transfers.   Pt was then setup at sink to complete grooming and required hand-over-hand guidance to initiation and sustain engagement in task.  Daughter present encouraging pt throughout session.   No literature present to guide treatment with donning TLSO therefore OT provided written literature from manufacturer with demonstration of alternate use of support straps (under arm OR over shoulder OR both to provide stabilization).   OT provided daughter with hyperlink on manufacturer's YouTube channel for care and adjustment of TLSO.     Therapy  Documentation Precautions:  Precautions Precautions: Back, Fall Precaution Comments: cortrak tube, fall (parkinson's and recent CVA) Required Braces or Orthoses: Spinal Brace Spinal Brace: Applied in supine position, Thoracolumbosacral orthotic Restrictions Weight Bearing Restrictions: No   Vital Signs: Therapy Vitals Temp: 97.9 F (36.6 C) Temp Source: Axillary Pulse Rate: 66 Resp: 18 BP: (!) 113/57 Patient Position (if appropriate): Sitting Oxygen Therapy SpO2: 95 % O2 Device: Not Delivered   Pain: No expression of pain observed.  See Function Navigator for Current Functional Status.   Therapy/Group: Individual Therapy  Gregory [name] 06/24/2016, 2:57 PM

## 2016-06-25 ENCOUNTER — Inpatient Hospital Stay (HOSPITAL_COMMUNITY): Payer: Medicare Other | Admitting: Physical Therapy

## 2016-06-25 ENCOUNTER — Inpatient Hospital Stay (HOSPITAL_COMMUNITY): Payer: Medicare Other | Admitting: Occupational Therapy

## 2016-06-25 ENCOUNTER — Inpatient Hospital Stay (HOSPITAL_COMMUNITY): Payer: Medicare Other | Admitting: Speech Pathology

## 2016-06-25 ENCOUNTER — Inpatient Hospital Stay (HOSPITAL_COMMUNITY): Payer: Medicare Other

## 2016-06-25 ENCOUNTER — Ambulatory Visit: Payer: Medicare Other | Admitting: Neurology

## 2016-06-25 LAB — GLUCOSE, CAPILLARY
GLUCOSE-CAPILLARY: 109 mg/dL — AB (ref 65–99)
GLUCOSE-CAPILLARY: 68 mg/dL (ref 65–99)
GLUCOSE-CAPILLARY: 69 mg/dL (ref 65–99)
Glucose-Capillary: 62 mg/dL — ABNORMAL LOW (ref 65–99)
Glucose-Capillary: 69 mg/dL (ref 65–99)
Glucose-Capillary: 78 mg/dL (ref 65–99)
Glucose-Capillary: 82 mg/dL (ref 65–99)
Glucose-Capillary: 96 mg/dL (ref 65–99)
Glucose-Capillary: 98 mg/dL (ref 65–99)

## 2016-06-25 LAB — CBC
HEMATOCRIT: 31.8 % — AB (ref 39.0–52.0)
HEMOGLOBIN: 9.5 g/dL — AB (ref 13.0–17.0)
MCH: 27.6 pg (ref 26.0–34.0)
MCHC: 29.9 g/dL — ABNORMAL LOW (ref 30.0–36.0)
MCV: 92.4 fL (ref 78.0–100.0)
Platelets: 414 10*3/uL — ABNORMAL HIGH (ref 150–400)
RBC: 3.44 MIL/uL — AB (ref 4.22–5.81)
RDW: 16.6 % — ABNORMAL HIGH (ref 11.5–15.5)
WBC: 10.7 10*3/uL — AB (ref 4.0–10.5)

## 2016-06-25 LAB — RENAL FUNCTION PANEL
Albumin: 2.6 g/dL — ABNORMAL LOW (ref 3.5–5.0)
Anion gap: 4 — ABNORMAL LOW (ref 5–15)
BUN: 32 mg/dL — ABNORMAL HIGH (ref 6–20)
CHLORIDE: 118 mmol/L — AB (ref 101–111)
CO2: 25 mmol/L (ref 22–32)
CREATININE: 0.96 mg/dL (ref 0.61–1.24)
Calcium: 8.8 mg/dL — ABNORMAL LOW (ref 8.9–10.3)
Glucose, Bld: 90 mg/dL (ref 65–99)
POTASSIUM: 3.5 mmol/L (ref 3.5–5.1)
Phosphorus: 3.3 mg/dL (ref 2.5–4.6)
Sodium: 147 mmol/L — ABNORMAL HIGH (ref 135–145)

## 2016-06-25 LAB — HEPARIN LEVEL (UNFRACTIONATED)
Heparin Unfractionated: 0.66 IU/mL (ref 0.30–0.70)
Heparin Unfractionated: 0.56 IU/mL (ref 0.30–0.70)

## 2016-06-25 MED ORDER — DEXTROSE 50 % IV SOLN
INTRAVENOUS | Status: AC
  Administered 2016-06-25: 50 mL
  Filled 2016-06-25: qty 50

## 2016-06-25 MED ORDER — ACETAMINOPHEN 160 MG/5ML PO SOLN
325.0000 mg | ORAL | Status: DC | PRN
  Administered 2016-06-26: 650 mg via ORAL
  Filled 2016-06-25: qty 20.3

## 2016-06-25 MED ORDER — DEXTROSE 50 % IV SOLN
INTRAVENOUS | Status: AC
  Administered 2016-06-25: 25 mL
  Filled 2016-06-25: qty 50

## 2016-06-25 MED ORDER — FREE WATER
250.0000 mL | Freq: Three times a day (TID) | Status: DC
  Administered 2016-06-25 – 2016-06-29 (×16): 250 mL

## 2016-06-25 MED ORDER — HYDROCODONE-ACETAMINOPHEN 7.5-325 MG/15ML PO SOLN: 2.5000 mL | ORAL | Status: DC | PRN

## 2016-06-25 MED ORDER — NEPRO/CARBSTEADY PO LIQD
1000.0000 mL | ORAL | Status: DC
  Administered 2016-06-26 – 2016-06-28 (×3): 1000 mL via ORAL
  Filled 2016-06-25 (×6): qty 1000

## 2016-06-25 NOTE — Progress Notes (Signed)
ANTICOAGULATION CONSULT NOTE - Follow Up Consult  Pharmacy Consult for heparin Indication: CVA /afib  No Known Allergies  Patient Measurements: Height: 5\' 11"  (180.3 cm) Weight: 203 lb 3.2 oz (92.2 kg) IBW/kg (Calculated) : 75.3  Vital Signs: Temp: 97.8 F (36.6 C) (02/05 1416) Temp Source: Oral (02/05 1416) BP: 118/48 (02/05 1416) Pulse Rate: 61 (02/05 1416)  Labs:  Recent Labs  06/23/16 0545 06/24/16 0354 06/25/16 0641 06/25/16 1755  HGB 9.6* 9.9* 9.5*  --   HCT 32.5* 33.8* 31.8*  --   PLT 543* 534* 414*  --   HEPARINUNFRC 0.52 0.42 0.66 0.56  CREATININE 1.15 1.07 0.96  --     Estimated Creatinine Clearance: 76 mL/min (by C-G formula based on SCr of 0.96 mg/dL).   Medications:  Scheduled:  . aspirin  325 mg Per Tube Daily  . carbidopa-levodopa  1.5 tablet Per Tube QID  . chlorhexidine  15 mL Mouth Rinse BID  . docusate  100 mg Per Tube BID  . feeding supplement (PRO-STAT SUGAR FREE 64)  30 mL Per Tube BID  . free water  250 mL Per Tube TID WC & HS  . insulin aspart  0-9 Units Subcutaneous Q4H  . mouth rinse  15 mL Mouth Rinse q12n4p  . pantoprazole sodium  40 mg Per Tube Daily   Infusions:  . feeding supplement (NEPRO CARB STEADY) 1,000 mL (06/25/16 1602)  . 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   Repeat heparin level is slightly supratherapeutic at 0.56 on heparin 1550 units/hr. Nurse reports patient still has some hematuria but has not worsened. Will decrease rate.  Goal of Therapy:  Heparin level 0.3-0.5 units/ml Monitor platelets by anticoagulation protocol: Yes   Plan:  Decrease heparin to 1500 units/hr 8h HL Daily heparin level and CBC   Arlean Hoppingorey M. Newman PiesBall, PharmD, BCPS Clinical Pharmacist [phone] 06/25/16 7:50 PM

## 2016-06-25 NOTE — Progress Notes (Signed)
Foley irrigated with 200 mL NS per order. Patient tolerated.

## 2016-06-25 NOTE — Progress Notes (Signed)
 Subjective/Complaints: Patient removed his feeding tube despite wearing mittens. He is alert, awake today. Following simple commands.  Review of systems difficult to obtain secondary to mental status as well as severe dysarthria  Objective: Vital Signs: Blood pressure 122/60, pulse 70, temperature 98 F (36.7 C), temperature source Oral, resp. rate 18, height 5' 11" (1.803 m), weight 92.2 kg (203 lb 3.2 oz), SpO2 98 %. No results found. Results for orders placed or performed during the hospital encounter of 06/20/16 (from the past 72 hour(s))  Heparin level (unfractionated)     Status: None   Collection Time: 06/22/16 11:38 AM  Result Value Ref Range   Heparin Unfractionated 0.49 0.30 - 0.70 IU/mL    Comment:        IF HEPARIN RESULTS ARE BELOW EXPECTED VALUES, AND PATIENT DOSAGE HAS BEEN CONFIRMED, SUGGEST FOLLOW UP TESTING OF ANTITHROMBIN III LEVELS.   CBC     Status: Abnormal   Collection Time: 06/22/16 11:38 AM  Result Value Ref Range   WBC 10.1 4.0 - 10.5 K/uL   RBC 4.01 (L) 4.22 - 5.81 MIL/uL   Hemoglobin 11.2 (L) 13.0 - 17.0 g/dL   HCT 37.7 (L) 39.0 - 52.0 %   MCV 94.0 78.0 - 100.0 fL   MCH 27.9 26.0 - 34.0 pg   MCHC 29.7 (L) 30.0 - 36.0 g/dL   RDW 16.3 (H) 11.5 - 15.5 %   Platelets 606 (H) 150 - 400 K/uL  Renal function panel     Status: Abnormal   Collection Time: 06/22/16 11:38 AM  Result Value Ref Range   Sodium 149 (H) 135 - 145 mmol/L   Potassium 3.9 3.5 - 5.1 mmol/L   Chloride 116 (H) 101 - 111 mmol/L   CO2 24 22 - 32 mmol/L   Glucose, Bld 124 (H) 65 - 99 mg/dL   BUN 50 (H) 6 - 20 mg/dL   Creatinine, Ser 1.48 (H) 0.61 - 1.24 mg/dL   Calcium 9.3 8.9 - 10.3 mg/dL   Phosphorus 3.3 2.5 - 4.6 mg/dL   Albumin 2.9 (L) 3.5 - 5.0 g/dL   GFR calc non Af Amer 44 (L) >60 mL/min   GFR calc Af Amer 51 (L) >60 mL/min    Comment: (NOTE) The eGFR has been calculated using the CKD EPI equation. This calculation has not been validated in all clinical  situations. eGFR's persistently <60 mL/min signify possible Chronic Kidney Disease.    Anion gap 9 5 - 15  Glucose, capillary     Status: Abnormal   Collection Time: 06/22/16 12:02 PM  Result Value Ref Range   Glucose-Capillary 127 (H) 65 - 99 mg/dL  Glucose, capillary     Status: Abnormal   Collection Time: 06/22/16  4:33 PM  Result Value Ref Range   Glucose-Capillary 109 (H) 65 - 99 mg/dL  Glucose, capillary     Status: Abnormal   Collection Time: 06/22/16  8:14 PM  Result Value Ref Range   Glucose-Capillary 120 (H) 65 - 99 mg/dL  Glucose, capillary     Status: Abnormal   Collection Time: 06/22/16 11:50 PM  Result Value Ref Range   Glucose-Capillary 122 (H) 65 - 99 mg/dL  Glucose, capillary     Status: Abnormal   Collection Time: 06/23/16 12:07 AM  Result Value Ref Range   Glucose-Capillary 110 (H) 65 - 99 mg/dL  Glucose, capillary     Status: Abnormal   Collection Time: 06/23/16  3:59 AM  Result Value Ref Range     Glucose-Capillary 109 (H) 65 - 99 mg/dL  Heparin level (unfractionated)     Status: None   Collection Time: 06/23/16  5:45 AM  Result Value Ref Range   Heparin Unfractionated 0.52 0.30 - 0.70 IU/mL    Comment:        IF HEPARIN RESULTS ARE BELOW EXPECTED VALUES, AND PATIENT DOSAGE HAS BEEN CONFIRMED, SUGGEST FOLLOW UP TESTING OF ANTITHROMBIN III LEVELS.   CBC     Status: Abnormal   Collection Time: 06/23/16  5:45 AM  Result Value Ref Range   WBC 9.4 4.0 - 10.5 K/uL   RBC 3.47 (L) 4.22 - 5.81 MIL/uL   Hemoglobin 9.6 (L) 13.0 - 17.0 g/dL   HCT 32.5 (L) 39.0 - 52.0 %   MCV 93.7 78.0 - 100.0 fL   MCH 27.7 26.0 - 34.0 pg   MCHC 29.5 (L) 30.0 - 36.0 g/dL   RDW 16.4 (H) 11.5 - 15.5 %   Platelets 543 (H) 150 - 400 K/uL  Renal function panel     Status: Abnormal   Collection Time: 06/23/16  5:45 AM  Result Value Ref Range   Sodium 151 (H) 135 - 145 mmol/L   Potassium 3.8 3.5 - 5.1 mmol/L   Chloride 117 (H) 101 - 111 mmol/L   CO2 25 22 - 32 mmol/L    Glucose, Bld 114 (H) 65 - 99 mg/dL   BUN 43 (H) 6 - 20 mg/dL   Creatinine, Ser 1.15 0.61 - 1.24 mg/dL   Calcium 9.2 8.9 - 10.3 mg/dL   Phosphorus 3.0 2.5 - 4.6 mg/dL   Albumin 2.7 (L) 3.5 - 5.0 g/dL   GFR calc non Af Amer 60 (L) >60 mL/min   GFR calc Af Amer >60 >60 mL/min    Comment: (NOTE) The eGFR has been calculated using the CKD EPI equation. This calculation has not been validated in all clinical situations. eGFR's persistently <60 mL/min signify possible Chronic Kidney Disease.    Anion gap 9 5 - 15  Glucose, capillary     Status: Abnormal   Collection Time: 06/23/16  8:45 AM  Result Value Ref Range   Glucose-Capillary 113 (H) 65 - 99 mg/dL  Glucose, capillary     Status: Abnormal   Collection Time: 06/23/16  9:14 AM  Result Value Ref Range   Glucose-Capillary 119 (H) 65 - 99 mg/dL  Glucose, capillary     Status: Abnormal   Collection Time: 06/23/16 11:38 AM  Result Value Ref Range   Glucose-Capillary 108 (H) 65 - 99 mg/dL  Glucose, capillary     Status: Abnormal   Collection Time: 06/23/16  4:39 PM  Result Value Ref Range   Glucose-Capillary 113 (H) 65 - 99 mg/dL  Glucose, capillary     Status: Abnormal   Collection Time: 06/23/16  8:13 PM  Result Value Ref Range   Glucose-Capillary 109 (H) 65 - 99 mg/dL  Glucose, capillary     Status: Abnormal   Collection Time: 06/24/16 12:58 AM  Result Value Ref Range   Glucose-Capillary 118 (H) 65 - 99 mg/dL  Heparin level (unfractionated)     Status: None   Collection Time: 06/24/16  3:54 AM  Result Value Ref Range   Heparin Unfractionated 0.42 0.30 - 0.70 IU/mL    Comment:        IF HEPARIN RESULTS ARE BELOW EXPECTED VALUES, AND PATIENT DOSAGE HAS BEEN CONFIRMED, SUGGEST FOLLOW UP TESTING OF ANTITHROMBIN III LEVELS.   CBC  Status: Abnormal   Collection Time: 06/24/16  3:54 AM  Result Value Ref Range   WBC 12.7 (H) 4.0 - 10.5 K/uL   RBC 3.59 (L) 4.22 - 5.81 MIL/uL   Hemoglobin 9.9 (L) 13.0 - 17.0 g/dL   HCT 33.8  (L) 39.0 - 52.0 %   MCV 94.2 78.0 - 100.0 fL   MCH 27.6 26.0 - 34.0 pg   MCHC 29.3 (L) 30.0 - 36.0 g/dL   RDW 16.3 (H) 11.5 - 15.5 %   Platelets 534 (H) 150 - 400 K/uL  Renal function panel     Status: Abnormal   Collection Time: 06/24/16  3:54 AM  Result Value Ref Range   Sodium 150 (H) 135 - 145 mmol/L   Potassium 3.6 3.5 - 5.1 mmol/L   Chloride 118 (H) 101 - 111 mmol/L   CO2 26 22 - 32 mmol/L   Glucose, Bld 109 (H) 65 - 99 mg/dL   BUN 39 (H) 6 - 20 mg/dL   Creatinine, Ser 1.07 0.61 - 1.24 mg/dL   Calcium 9.1 8.9 - 10.3 mg/dL   Phosphorus 2.8 2.5 - 4.6 mg/dL   Albumin 2.7 (L) 3.5 - 5.0 g/dL   GFR calc non Af Amer >60 >60 mL/min   GFR calc Af Amer >60 >60 mL/min    Comment: (NOTE) The eGFR has been calculated using the CKD EPI equation. This calculation has not been validated in all clinical situations. eGFR's persistently <60 mL/min signify possible Chronic Kidney Disease.    Anion gap 6 5 - 15  Glucose, capillary     Status: Abnormal   Collection Time: 06/24/16  4:14 AM  Result Value Ref Range   Glucose-Capillary 110 (H) 65 - 99 mg/dL  Glucose, capillary     Status: Abnormal   Collection Time: 06/24/16  8:12 AM  Result Value Ref Range   Glucose-Capillary 110 (H) 65 - 99 mg/dL  Glucose, capillary     Status: Abnormal   Collection Time: 06/24/16 11:46 AM  Result Value Ref Range   Glucose-Capillary 114 (H) 65 - 99 mg/dL  Glucose, capillary     Status: None   Collection Time: 06/24/16  4:14 PM  Result Value Ref Range   Glucose-Capillary 99 65 - 99 mg/dL  Glucose, capillary     Status: None   Collection Time: 06/24/16  8:31 PM  Result Value Ref Range   Glucose-Capillary 91 65 - 99 mg/dL  Glucose, capillary     Status: None   Collection Time: 06/25/16 12:06 AM  Result Value Ref Range   Glucose-Capillary 69 65 - 99 mg/dL  Glucose, capillary     Status: Abnormal   Collection Time: 06/25/16 12:55 AM  Result Value Ref Range   Glucose-Capillary 109 (H) 65 - 99 mg/dL   Glucose, capillary     Status: Abnormal   Collection Time: 06/25/16  4:25 AM  Result Value Ref Range   Glucose-Capillary 62 (L) 65 - 99 mg/dL  Glucose, capillary     Status: None   Collection Time: 06/25/16  5:18 AM  Result Value Ref Range   Glucose-Capillary 96 65 - 99 mg/dL  Heparin level (unfractionated)     Status: None   Collection Time: 06/25/16  6:41 AM  Result Value Ref Range   Heparin Unfractionated 0.66 0.30 - 0.70 IU/mL    Comment:        IF HEPARIN RESULTS ARE BELOW EXPECTED VALUES, AND PATIENT DOSAGE HAS BEEN CONFIRMED, SUGGEST FOLLOW UP TESTING  OF ANTITHROMBIN III LEVELS.   CBC     Status: Abnormal   Collection Time: 06/25/16  6:41 AM  Result Value Ref Range   WBC 10.7 (H) 4.0 - 10.5 K/uL   RBC 3.44 (L) 4.22 - 5.81 MIL/uL   Hemoglobin 9.5 (L) 13.0 - 17.0 g/dL   HCT 31.8 (L) 39.0 - 52.0 %   MCV 92.4 78.0 - 100.0 fL   MCH 27.6 26.0 - 34.0 pg   MCHC 29.9 (L) 30.0 - 36.0 g/dL   RDW 16.6 (H) 11.5 - 15.5 %   Platelets 414 (H) 150 - 400 K/uL  Renal function panel     Status: Abnormal   Collection Time: 06/25/16  6:41 AM  Result Value Ref Range   Sodium 147 (H) 135 - 145 mmol/L   Potassium 3.5 3.5 - 5.1 mmol/L   Chloride 118 (H) 101 - 111 mmol/L   CO2 25 22 - 32 mmol/L   Glucose, Bld 90 65 - 99 mg/dL   BUN 32 (H) 6 - 20 mg/dL   Creatinine, Ser 0.96 0.61 - 1.24 mg/dL   Calcium 8.8 (L) 8.9 - 10.3 mg/dL   Phosphorus 3.3 2.5 - 4.6 mg/dL   Albumin 2.6 (L) 3.5 - 5.0 g/dL   GFR calc non Af Amer >60 >60 mL/min   GFR calc Af Amer >60 >60 mL/min    Comment: (NOTE) The eGFR has been calculated using the CKD EPI equation. This calculation has not been validated in all clinical situations. eGFR's persistently <60 mL/min signify possible Chronic Kidney Disease.    Anion gap 4 (L) 5 - 15  Glucose, capillary     Status: None   Collection Time: 06/25/16  8:04 AM  Result Value Ref Range   Glucose-Capillary 68 65 - 99 mg/dL      General: No acute distress Mood and  affect are appropriate Heart: Irregularly irregular Lungs: Clear to auscultation, breathing unlabored, no rales or wheezes Abdomen: Positive bowel sounds, soft nontender to palpation, nondistended Extremities: No clubbing, cyanosis, or edema Skin: No evidence of breakdown, no evidence of rash Neurologic: Cranial nerves II through XII intact, motor strength is 4/5 in bilateral deltoid, bicep, tricep, grip, hip flexor, knee extensors, ankle dorsiflexor and plantar flexor Sensory exam normal sensation to light touch and proprioception in bilateral upper and lower extremities Movements are slow. Needs encouragement and cueing Musculoskeletal: No pain with upper or lower extremity muscle testing.   Assessment/Plan: 1. 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 which require 3+ hours per day of interdisciplinary therapy in a comprehensive inpatient rehab setting. Physiatrist is providing close team supervision and 24 hour management of active medical problems listed below. Physiatrist and rehab team continue to assess barriers to discharge/monitor patient progress toward functional and medical goals. FIM: Function - Bathing Position: Wheelchair/chair at sink Body parts bathed by patient: Right arm, Left arm, Chest, Abdomen, Right upper leg Body parts bathed by helper: Front perineal area, Buttocks, Left upper leg, Right lower leg, Left lower leg, Back Assist Level: 2 helpers  Function- Upper Body Dressing/Undressing What is the patient wearing?: Hospital gown, Orthosis Orthosis activity level: 2 helpers Assist Level: 2 helpers Function - Lower Body Dressing/Undressing What is the patient wearing?: Pants, Non-skid slipper socks Position: Wheelchair/chair at sink Pants- Performed by helper: Thread/unthread right pants leg, Thread/unthread left pants leg, Pull pants up/down Non-skid slipper socks- Performed by helper: Don/doff  right sock, Don/doff left sock Assist for   footwear: Dependant Assist for lower body dressing: 2 Helpers  Function - Toileting Toileting activity did not occur: No continent bowel/bladder event Toileting steps completed by helper: Adjust clothing prior to toileting, Performs perineal hygiene, Adjust clothing after toileting Toileting Assistive Devices: Grab bar or rail Assist level: Two helpers  Function - Air cabin crew transfer assistive device: Bedside commode Assist level to toilet: Moderate assist (Pt 50 - 74%/lift or lower) Assist level from toilet: Moderate assist (Pt 50 - 74%/lift or lower) Assist level to bedside commode (at bedside): 2 helpers (fatigued) Assist level from bedside commode (at bedside): 2 helpers  Function - Chair/bed transfer Chair/bed transfer method: Stand pivot Chair/bed transfer assist level: Maximal assist (Pt 25 - 49%/lift and lower) Chair/bed transfer assistive device: Bedrails, Armrests Chair/bed transfer details: Manual facilitation for weight bearing, Manual facilitation for placement, Verbal cues for technique, Tactile cues for posture, Verbal cues for sequencing  Function - Locomotion: Wheelchair Will patient use wheelchair at discharge?: Yes Type: Manual Max wheelchair distance: 150 Assist Level: Dependent (Pt equals 0%) Assist Level: Dependent (Pt equals 0%) Assist Level: Dependent (Pt equals 0%) Turns around,maneuvers to table,bed, and toilet,negotiates 3% grade,maneuvers on rugs and over doorsills: No Function - Locomotion: Ambulation Assistive device: Rail in hallway Max distance: 8 Assist level: 2 helpers Walk 10 feet activity did not occur: Safety/medical concerns Walk 50 feet with 2 turns activity did not occur: Safety/medical concerns Walk 150 feet activity did not occur: Safety/medical concerns Walk 10 feet on uneven surfaces activity did not occur: Safety/medical concerns  Function - Comprehension Comprehension:  Auditory Comprehension assist level: Understands basic 25 - 49% of the time/ requires cueing 50 - 75% of the time  Function - Expression Expression: Verbal Expression assist level: Expresses basic 25 - 49% of the time/requires cueing 50 - 75% of the time. Uses single words/gestures.  Function - Social Interaction Social Interaction assist level: Interacts appropriately 25 - 49% of time - Needs frequent redirection.  Function - Problem Solving Problem solving assist level: Solves basic 25 - 49% of the time - needs direction more than half the time to initiate, plan or complete simple activities  Function - Memory Memory assist level: Recognizes or recalls less than 25% of the time/requires cueing greater than 75% of the time Patient normally able to recall (first 3 days only): Current season, That he or she is in a hospital  Medical Problem List and Plan: 1. Cognitive deficits, weakness, limitations in self-care secondary to right CVA.Multitrauma is major limiting factor with mobility CIR PT, OT, speech, participation fluctuates with mental status 2. DVT Prophylaxis/Anticoagulation: Pharmaceutical: Heparin, until taking by mouth 3. Pain Management: tylenol prn 4. Mood: Hard to determine at this time due cognitive impairments. LCSW to follow along for evaluation as appropriate.  5. Neuropsych: This patient is not capable of making decisions on his own behalf. 6. Skin/Wound Care: routine pressure relief measures.  7. Fluids/Electrolytes/Nutrition: Add water flushes to help with hydration.  8. Parkinsonism:On sinemet qid.  9. PAF: Now in NSR and controlled at this time. Monitor HR bid. Continue heparin per Pharm protocol--Eliquis once able to start po intake.  10. Acute renal injury: Due to BOO. Continue foley for monitoring I/O. Renal status improving  11. Acute Congestive heart failure: Monitor daily weights--down to 203 lbs. Monitor for signs of overload. 12. UTI: Completed Cipro,  rechecked UA +, C&S, negative 13. Hematuria: Continue to monitor output for now. Hemoglobins have been in the 9-10 range. One value of 11,  which was spurious 14. Hypernatremia: improving 147 this morning continue water flushes.  15. T12 fracture. Stable, per neurosurgery, may don and doff orthosis while seated on bed, shower with orthosis off 16.  Pre renal azotemia, we'll need to increase H2O flushes, has had problems with keeping feeding tube in place, which is contributing to this issue LOS (Days) 5 A FACE TO FACE EVALUATION WAS PERFORMED  [name],ANDREW E 06/25/2016, 9:05 AM   

## 2016-06-25 NOTE — Progress Notes (Signed)
Occupational Therapy Session Note  Patient Details  Name: Gregory [name] MRN: [id number] Date of Birth: 1941/01/24  Today's Date: 06/25/2016 OT Individual Time: [id number] OT Individual Time Calculation (min): 57 min    Short Term Goals: Week 1:  OT Short Term Goal 1 (Week 1): Pt will tolerate sitting EOB for 5 mins to engage in grooming tasks OT Short Term Goal 2 (Week 1): Pt will complete bed mobility with max assist of 1 caregiver to engage in functional tasks OT Short Term Goal 3 (Week 1): Pt will engage in bathing at bed level with mod cues and max assist of one caregiver OT Short Term Goal 4 (Week 1): Pt will complete toilet transfers with mod assist of one caregiver   Skilled Therapeutic Interventions/Progress Updates:    Treatment session with focus on activity tolerance, arousal, and sequencing.  Completed bd mobility with mod-max assist for rolling to Lt, with cues to adhere to back precautions.  Required +2 for sidelying to sitting due to increased stiffness secondary to not receiving Parkinson's meds with Cortrak tube removed.  Max assist sit > stand with cues for anterior weight shift, sidestepping to Rt with max multimodal cues and 2nd person for safety as pt "froze" during transfer with inability to advance RLE to position into w/c.  Engaged in sit > stand in Dayroom with focus on weight shift and upright standing balance.  Pt with lean to Lt requiring tactile cues for upright stance.  Engaged in sorting task while engaging in conversation regarding past work.  Pt able to maintain standing balance approx 5 mins before returning to sitting.  Returned to room, completed grooming tasks with total assist for oral hygiene with suction toothbrush.  Returned to bed as Cortrak team to arrive to replace tube.  Transfer requiring +2 for safety and max assist due to decreased motor planning with increased fatigue.  Therapy Documentation Precautions:  Precautions Precautions: Back,  Fall Precaution Comments: cortrak tube, fall (parkinson's and recent CVA) Required Braces or Orthoses: Spinal Brace Spinal Brace: Applied in supine position, Thoracolumbosacral orthotic Restrictions Weight Bearing Restrictions: No Pain:  Pt with no c/o pain  See Function Navigator for Current Functional Status.   Therapy/Group: Individual Therapy  Rosalio LoudHOXIE, Charmion Hapke 06/25/2016, 9:52 AM
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
## 2016-06-25 NOTE — Progress Notes (Signed)
Dietary team confirmed placement of cortrack NG tube. Reported patient calm during procedure. Order placed for x-ray confirmation. Placed patient in soft wrist restraints. NG tube marked at exit point from white clamp, 85. Patient resting at this time.

## 2016-06-25 NOTE — Significant Event (Signed)
Hypoglycemic Event  CBG: 62  Treatment: D50 IV 25 mL  Symptoms: None  Follow-up CBG: Time:0518 CBG Result:96  Possible Reasons for Event: Other: tube feeding has been stopped, awaiting tube replacement  Comments/MD notified:treated per protocol.     Alfredo MartinezMurray, Dimitrius Steedman A

## 2016-06-25 NOTE — Progress Notes (Signed)
ANTICOAGULATION CONSULT NOTE - Follow Up Consult  Pharmacy Consult for heparin Indication: CVA /afib  No Known Allergies  Patient Measurements: Height: 5\' 11"  (180.3 cm) Weight: 203 lb 3.2 oz (92.2 kg) IBW/kg (Calculated) : 75.3  Vital Signs: Temp: 98 F (36.7 C) (02/05 0419) Temp Source: Oral (02/05 0419) BP: 122/60 (02/05 0419) Pulse Rate: 70 (02/05 0419)  Labs:  Recent Labs  06/23/16 0545 06/24/16 0354 06/25/16 0641  HGB 9.6* 9.9* 9.5*  HCT 32.5* 33.8* 31.8*  PLT 543* 534* 414*  HEPARINUNFRC 0.52 0.42 0.66  CREATININE 1.15 1.07 0.96    Estimated Creatinine Clearance: 76 mL/min (by C-G formula based on SCr of 0.96 mg/dL).   Medications:  Scheduled:  . aspirin  325 mg Per Tube Daily  . carbidopa-levodopa  1.5 tablet Per Tube QID  . chlorhexidine  15 mL Mouth Rinse BID  . docusate  100 mg Per Tube BID  . feeding supplement (PRO-STAT SUGAR FREE 64)  30 mL Per Tube BID  . free water  250 mL Per Tube TID WC & HS  . guaiFENesin  15 mL Per Tube Q6H  . insulin aspart  0-9 Units Subcutaneous Q4H  . mouth rinse  15 mL Mouth Rinse q12n4p  . pantoprazole sodium  40 mg Per Tube Daily   Infusions:  . feeding supplement (NEPRO CARB STEADY) 1,000 mL (06/24/16 0113)  . heparin 1,550 Units/hr (06/25/16 1007)    Assessment: 76 yo male had recent embolic stroke (4/091/18) secondary to R-ICA stenosis and new onset Afib, currently IV heparin with modified therapeutic goal (0.3-0.5) d/t recent liver hematoma, flank hematoma and hematuria. Heparin level 0.66 this morning, hgb 9.5, pltc 414 K, stable. Per RN, Pt still has hematuria today. Will switch to oral anticoagulation if pt can swallow.   Goal of Therapy:  Heparin level 0.3-0.5 units/ml Monitor platelets by anticoagulation protocol: Yes   Plan:  - Decrease heparin t0 1550 units/hr - f/u 8 hr heparin level at 1800  - Daily heparin level and CBC  - f/u transitioning to oral anticoag when taking PO   Bayard HuggerMei Humna Moorehouse, PharmD, BCPS   Clinical Pharmacist  Pager: [phone]  06/25/16 12:09 PM

## 2016-06-25 NOTE — Plan of Care (Signed)
Problem: RH BOWEL ELIMINATION Goal: RH STG MANAGE BOWEL WITH ASSISTANCE STG Manage Bowel with min Assistance.  Outcome: Not Progressing Required supp. On previous shift

## 2016-06-25 NOTE — Progress Notes (Signed)
Physical Therapy Session Note  Patient Details  Name: Gregory LoronRichard Abee MRN: [id number] Date of Birth: 03/05/1941  Today's Date: 06/25/2016 PT Individual Time: 1600-1630 PT Individual Time Calculation (min): 30 min   Short Term Goals: Week 1:  PT Short Term Goal 1 (Week 1): Pt will demonstrate bed mobility with mod assist and use of bed features PT Short Term Goal 2 (Week 1): Pt will transfer with +1 assist consistently PT Short Term Goal 3 (Week 1): Pt will ambulate 30' with +2 assist PT Short Term Goal 4 (Week 1): Pt will propel w/c 20' wtih min assist  Skilled Therapeutic Interventions/Progress Updates:    no c/o pain . Session focus on attention, recall, following 1-step commands, and stair negotiation.    Pt transfers sit>stand from w/c with mod assist to rise and verbal cues to push from chair.  Stair negotiation x8 steps with 2 rails, +2 assist and multimodal cues to facilitate weight shift, for foot placement on step, for upright posture, and for hand placement.  Pt requires 1 step commands and needs commands repeated in order to follow consistently.  Pt returned to room at end of session and positioned tilted in TIS w/c, soft wrist restraints and hand mitts applied, soft call bell in lap.    Therapy Documentation Precautions:  Precautions Precautions: Back, Fall Precaution Comments: cortrak tube, fall (parkinson's and recent CVA) Required Braces or Orthoses: Spinal Brace Spinal Brace: Applied in supine position, Thoracolumbosacral orthotic Restrictions Weight Bearing Restrictions: No   See Function Navigator for Current Functional Status.   Therapy/Group: Individual Therapy  Ladora DanielCaitlin E Penven-Crew 06/25/2016, 4:40 PM

## 2016-06-25 NOTE — Significant Event (Signed)
Hypoglycemic Event  CBG: 69  Treatment: D50 IV 25 mL  Symptoms: None  Follow-up CBG: Time:0055 CBG Result:109  Possible Reasons for Event: Other: tube feeding stopped until cortrak tube replaced in am  Comments/MD notified:treated per protocol. Continuous tube feeds stopped until cortrak tube is replaced, in AM.    Alfredo MartinezMurray, Taleisha Kaczynski A

## 2016-06-25 NOTE — Progress Notes (Signed)
Physical Therapy Session Note  Patient Details  Name: Gregory [name] MRN: [id number] Date of Birth: Sep 13, 1940  Today's Date: 06/25/2016 PT Individual Time: 1100-1200 PT Individual Time Calculation (min): 60 min   Short Term Goals: Week 1:  PT Short Term Goal 1 (Week 1): Pt will demonstrate bed mobility with mod assist and use of bed features PT Short Term Goal 2 (Week 1): Pt will transfer with +1 assist consistently PT Short Term Goal 3 (Week 1): Pt will ambulate 30' with +2 assist PT Short Term Goal 4 (Week 1): Pt will propel w/c 20' wtih min assist  Skilled Therapeutic Interventions/Progress Updates:    no c/o pain.  Session focus on following 1 step commands, attention, transfers, gait, dynamic sitting balance, and standing tolerance.    Pt transfers supine>sitting EOB with HOB elevated maximally and mod assist to come fully to EOB.  TLSO donned total assist sitting EOB with pt able to maintain static balance with UE support and supervision.  Pt transfers throughout session with overall mod assist to come to standing and to facilitate weight shift when pivoting.  Transfer with STEDY to assess appropriateness for nursing staff to use for transfers, and pt able to complete with mod cues and assist +2 for managing equipment/IV. Toilet transfer at end of session with mod assist for transfer and total assist for toileting.    Gait training x63' with RW and mod assist to maintain straight path and midline orientation, max multimodal cues for step length and walker positioning.  Pt demos shuffling/festinating gait pattern and difficulty with turning to sit.    Pt left in tilt in space w/c with QRB and mitts in place, soft call bell in reach and needs met.   Therapy Documentation Precautions:  Precautions Precautions: Back, Fall Precaution Comments: cortrak tube, fall (parkinson's and recent CVA) Required Braces or Orthoses: Spinal Brace Spinal Brace: Applied in supine position,  Thoracolumbosacral orthotic Restrictions Weight Bearing Restrictions: No   See Function Navigator for Current Functional Status.   Therapy/Group: Individual Therapy  Earnest Conroy Penven-Crew 06/25/2016, 12:10 PM

## 2016-06-25 NOTE — Progress Notes (Signed)
Pt's youngest daughter called regarding coming in the morning to hopefully catch Dr Ephriam Knuckleshristian.  Daughter will be here by 7:30. Daughter made aware of pt's swallow test in the morning.  Daughter expressed concern about pt taking pain medication and/or sleep medication recently and being lethargic next morning.Daughter concerned this might impact swallow study in the morning. Pt has not had pain or sleep medication tonight.   Tera HelperPamela  Krosby Ritchie

## 2016-06-25 NOTE — Plan of Care (Signed)
Problem: RH BLADDER ELIMINATION Goal: RH STG MANAGE BLADDER WITH EQUIPMENT WITH ASSISTANCE STG Manage Bladder With Equipment With min Assistance  Outcome: Not Progressing Total assist with foley care

## 2016-06-25 NOTE — Progress Notes (Signed)
Speech Language Pathology Daily Session Note  Patient Details  Name: Gregory LoronRichard Durante MRN: [id number] Date of Birth: 01/25/1941  Today's Date: 06/25/2016 SLP Individual Time: 1415-1500 SLP Individual Time Calculation (min): 45 min  Short Term Goals: Week 1: SLP Short Term Goal 1 (Week 1): Patient will consume trials of ice chips without overt s/s of aspiration to assess readiness for repeat objective swallow assessment.  SLP Short Term Goal 2 (Week 1): Patient will increase speech intelligibility to ~75% at the phrase level with Mod A multimodal cues for use of speech intelligibility strategies.  SLP Short Term Goal 3 (Week 1): Patient will identify 2 physical and 2 cognitive deficits with Mod A mutimodal cues.  SLP Short Term Goal 4 (Week 1): Patient will demonstrate sustained attention to a functional task for 10 minutes with Mod A vebral cues for redirection.  SLP Short Term Goal 5 (Week 1): Patient will demonstrate functional problem solving for functional tasks with Mod A multimodal cues.   Skilled Therapeutic Interventions: Skilled treatment session focused on dysphagia and speech goals. SLP facilitated session by providing thorough oral care via the suction toothbrush. Patient consumed trials of ice chips and demonstrated what appeared to be a delayed swallow initiation with overt cough in 20% of trials, suspect due to fatigue. Patient with increased verbalizations today that appeared generally confused. However, patient was ~90% intelligible at the sentence level. Patient left upright in wheelchair with son present. Continue with current plan of care.      Function:  Eating Eating     Eating Assist Level: Helper feeds patient (with trials from SLP)           Cognition Comprehension Comprehension assist level: Understands basic 25 - 49% of the time/ requires cueing 50 - 75% of the time  Expression   Expression assist level: Expresses basic 25 - 49% of the time/requires cueing  50 - 75% of the time. Uses single words/gestures.  Social Interaction Social Interaction assist level: Interacts appropriately 25 - 49% of time - Needs frequent redirection.  Problem Solving Problem solving assist level: Solves basic 25 - 49% of the time - needs direction more than half the time to initiate, plan or complete simple activities  Memory Memory assist level: Recognizes or recalls less than 25% of the time/requires cueing greater than 75% of the time    Pain No/Denies Pain   Therapy/Group: Individual Therapy  Davia Smyre 06/25/2016, 3:52 PM

## 2016-06-26 ENCOUNTER — Inpatient Hospital Stay (HOSPITAL_COMMUNITY): Payer: Medicare Other | Admitting: Speech Pathology

## 2016-06-26 ENCOUNTER — Inpatient Hospital Stay (HOSPITAL_COMMUNITY): Payer: Medicare Other | Admitting: Physical Therapy

## 2016-06-26 ENCOUNTER — Inpatient Hospital Stay (HOSPITAL_COMMUNITY): Payer: Medicare Other

## 2016-06-26 ENCOUNTER — Inpatient Hospital Stay (HOSPITAL_COMMUNITY): Payer: Medicare Other | Admitting: Occupational Therapy

## 2016-06-26 LAB — RENAL FUNCTION PANEL
ANION GAP: 10 (ref 5–15)
Albumin: 2.6 g/dL — ABNORMAL LOW (ref 3.5–5.0)
BUN: 37 mg/dL — ABNORMAL HIGH (ref 6–20)
CHLORIDE: 114 mmol/L — AB (ref 101–111)
CO2: 23 mmol/L (ref 22–32)
Calcium: 8.9 mg/dL (ref 8.9–10.3)
Creatinine, Ser: 1.04 mg/dL (ref 0.61–1.24)
GFR calc non Af Amer: >60 mL/min (ref >60)
Glucose, Bld: 114 mg/dL — ABNORMAL HIGH (ref 65–99)
Phosphorus: 3.3 mg/dL (ref 2.5–4.6)
Potassium: 3.5 mmol/L (ref 3.5–5.1)
Sodium: 147 mmol/L — ABNORMAL HIGH (ref 135–145)

## 2016-06-26 LAB — GLUCOSE, CAPILLARY
GLUCOSE-CAPILLARY: 90 mg/dL (ref 65–99)
GLUCOSE-CAPILLARY: 75 mg/dL (ref 65–99)
GLUCOSE-CAPILLARY: 73 mg/dL (ref 65–99)
GLUCOSE-CAPILLARY: 82 mg/dL (ref 65–99)
Glucose-Capillary: 108 mg/dL — ABNORMAL HIGH (ref 65–99)

## 2016-06-26 LAB — CBC
HCT: 32.7 % — ABNORMAL LOW (ref 39.0–52.0)
HEMOGLOBIN: 9.8 g/dL — AB (ref 13.0–17.0)
MCH: 27.9 pg (ref 26.0–34.0)
MCHC: 30 g/dL (ref 30.0–36.0)
MCV: 93.2 fL (ref 78.0–100.0)
Platelets: 377 10*3/uL (ref 150–400)
RBC: 3.51 MIL/uL — AB (ref 4.22–5.81)
RDW: 16.9 % — ABNORMAL HIGH (ref 11.5–15.5)
WBC: 9.7 10*3/uL (ref 4.0–10.5)

## 2016-06-26 LAB — HEPARIN LEVEL (UNFRACTIONATED): Heparin Unfractionated: 0.5 IU/mL (ref 0.30–0.70)

## 2016-06-26 MED ORDER — PRO-STAT SUGAR FREE PO LIQD
30.0000 mL | Freq: Every day | ORAL | Status: DC
  Administered 2016-06-27 – 2016-06-29 (×3): 30 mL
  Filled 2016-06-26 (×3): qty 30

## 2016-06-26 MED ORDER — ACETAMINOPHEN 160 MG/5ML PO SOLN
325.0000 mg | ORAL | Status: DC | PRN
  Filled 2016-06-26: qty 20.3

## 2016-06-26 MED ORDER — POLYETHYLENE GLYCOL 3350 17 G PO PACK
17.0000 g | PACK | ORAL | Status: DC
  Administered 2016-06-26 – 2016-06-28 (×2): 17 g
  Filled 2016-06-26 (×2): qty 1
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
## 2016-06-26 NOTE — Progress Notes (Signed)
Physical Therapy Session Note  Patient Details  Name: Gregory [name] MRN: [id number] Date of Birth: 1941/04/14  Today's Date: 06/26/2016 PT Individual Time: 1300-1400 PT Individual Time Calculation (min): 60 min   Short Term Goals: Week 1:  PT Short Term Goal 1 (Week 1): Pt will demonstrate bed mobility with mod assist and use of bed features PT Short Term Goal 2 (Week 1): Pt will transfer with +1 assist consistently PT Short Term Goal 3 (Week 1): Pt will ambulate 30' with +2 assist PT Short Term Goal 4 (Week 1): Pt will propel w/c 20' wtih min assist  Skilled Therapeutic Interventions/Progress Updates:    c/o 3/10 pain, nurse reports premedicated.  Session focus on attention, visual scanning, recall, transfers, balance, and ambulation.   Bed mobility with total assist to roll and max assist to come to sitting using bed rails and HOB elevated maximally.  Pt able to scoot to EOB with max fade to mod assist. Sit>stand from EOB with mod assist to stand, max assist to facilitate weight shift and stepping for pivot to w/c.  PT provided pt with ice chips per protocol following oral care with suction toothbrush total assist.  Sit>stand x2 from w/c with steady assist and multimodal cues for forward weight shift and forward gaze.  Standing balance and tolerance during table top activity sorting cards focus on visual scanning, attention, and recall.  Pt able to tolerate standing appx 5 minutes before returning to seated position.  Gait training x65' with RW across carpet and tile with mod assist overall and max cues to increase step length.  Pt continues to demonstrate shuffling/festinating gait pattern though with some improvement noted since yesterday.  Pt returned to room at end of session and positioned in tilt in space w/c with soft call bell in reach and needs met. Soft wrist restraints applied to prevent pt from pulling cortrak tube out.   Therapy Documentation Precautions:   Precautions Precautions: Back, Fall Precaution Comments: cortrak tube, fall (parkinson's and recent CVA) Required Braces or Orthoses: Spinal Brace Spinal Brace: Applied in supine position, Thoracolumbosacral orthotic Restrictions Weight Bearing Restrictions: No   See Function Navigator for Current Functional Status.   Therapy/Group: Individual Therapy  [name] E Penven-Crew 06/26/2016, 2:06 PM

## 2016-06-26 NOTE — Plan of Care (Signed)
Problem: RH BOWEL ELIMINATION Goal: RH STG MANAGE BOWEL WITH ASSISTANCE STG Manage Bowel with min Assistance.  Outcome: Not Progressing Foley cath
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
## 2016-06-26 NOTE — Progress Notes (Signed)
Physical Therapy Session Note  Patient Details  Name: Gregory LoronRichard Walthall MRN: [id number] Date of Birth: [birth year]  Today's Date: 06/26/2016 PT Individual Time: 1545-1630 PT Individual Time Calculation (min): 45 min   Short Term Goals: Week 1:  PT Short Term Goal 1 (Week 1): Pt will demonstrate bed mobility with mod assist and use of bed features PT Short Term Goal 2 (Week 1): Pt will transfer with +1 assist consistently PT Short Term Goal 3 (Week 1): Pt will ambulate 30' with +2 assist PT Short Term Goal 4 (Week 1): Pt will propel w/c 20' wtih min assist  Skilled Therapeutic Interventions/Progress Updates:    no c/o pain.  Session focus on arousal, attention, transfers, gait, and w/c propulsion.    Pt transfers sit<>stand throughout session with min assist and multimodal cues for forward weight shift.  Gait training x25' before pt reports extreme fatigue and requesting to sit down.  PT supplied pt with 18x18 manual chair to practice self propulsion for UE strengthening, activity tolerance, and mobility.  Pt propelled w/c 140' with BUEs and min assist with mod multimodal cues to maintain straight pathway.  Pt requesting ice chips and requires max cues to recall need for oral care prior to consuming chips.  PT provided total assist for thorough oral care and pt consumed 6 ice chips with min cues for multiple swallow, no overt s/s of aspiration this trial.  Pt transferred from regular w/c to tilt in space w/c with stedy and +1 assist.  Pt positioned to comfort in tilt in space with soft call bell in lap and soft wrist restraints applied.    Therapy Documentation Precautions:  Precautions Precautions: Back, Fall Precaution Comments: cortrak tube, fall (parkinson's and recent CVA) Required Braces or Orthoses: Spinal Brace Spinal Brace: Applied in supine position, Thoracolumbosacral orthotic Restrictions Weight Bearing Restrictions: No   See Function Navigator for Current Functional  Status.   Therapy/Group: Individual Therapy  Ladora DanielCaitlin E Penven-Crew 06/26/2016, 4:33 PM

## 2016-06-26 NOTE — Progress Notes (Signed)
Speech Language Pathology Note  Patient Details  Name: Emelia LoronRichard Dennington MRN: [id number] Date of Birth: [birth year] Today's Date: 06/26/2016  MBSS complete. Full report located under chart review in imaging section.    Malini Flemings 06/26/2016, 12:26 PM

## 2016-06-26 NOTE — Progress Notes (Signed)
Instilled 10 more mL of sterile saline in foley balloon and irrigated bladder with 60 mL of sterile saline per urologys order.  Bright red bloody urine returned, no visible clots.  Also placed patient in traction as guided by urology PA using leg strap on left leg.  Dani Gobbleeardon, Napolean Sia J, RN
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
## 2016-06-26 NOTE — Progress Notes (Signed)
Subjective: Much more alert. Still has issues with balance. Now having gross hematuria. No chest pain or dyspnea, no leg edema.  Objective:  Vital Signs in the last 24 hours: Temp:  [97.4 F (36.3 C)-97.8 F (36.6 C)] 97.8 F (36.6 C) (02/06 1410) Pulse Rate:  [67-74] 74 (02/06 1410) Resp:  [16-18] 18 (02/06 1410) BP: (111-112)/(56-59) 111/56 (02/06 1410) SpO2:  [97 %-100 %] 100 % (02/06 1410) Weight:  [210 lb (95.3 kg)] 210 lb (95.3 kg) (02/06 0455)  Intake/Output from previous day: 02/05 0701 - 02/06 0700 In: 1787 [I.V.:187; NG/GT:970] Out: 2400 [Urine:2250; Emesis/NG output:150]  Physical Exam: Blood pressure 131/60, pulse 70, temperature 98.2 F (36.8 C), temperature source Oral, resp. rate 18, height 5\' 11"  (1.803 m), weight 212 lb 4.9 oz (96.3 kg), SpO2 100 %. Body mass index is 29.29 kg/m.  General appearance: alert, cooperative, appears stated age and no distress Eyes: negative findings: lids and lashes normal Neck: no JVD, supple, symmetrical, trachea midline and thyroid not enlarged, symmetric, no tenderness/mass/nodules Neck: JVP - normal, carotids 2+= without bruits Resp: clear to auscultation bilaterally Chest wall: Limited exam due to presence of brace Cardio: regular rate and rhythm, S1, S2 normal, no murmur, click, rub or gallop GI: soft, non-tender; bowel sounds normal; no masses,  no organomegaly Extremities: extremities normal, atraumatic, no cyanosis or edema  Lab Results: BMP  Recent Labs  06/24/16 0354 06/25/16 0641 06/26/16 0418  NA 150* 147* 147*  K 3.6 3.5 3.5  CL 118* 118* 114*  CO2 26 25 23   GLUCOSE 109* 90 114*  BUN 39* 32* 37*  CREATININE 1.07 0.96 1.04  CALCIUM 9.1 8.8* 8.9  GFRNONAA >60 >60 >60  GFRAA >60 >60 >60    CBC  Recent Labs Lab 06/21/16 0507  06/26/16 0418  WBC 8.6  < > 9.7  RBC 3.57*  < > 3.51*  HGB 10.0*  < > 9.8*  HCT 33.6*  < > 32.7*  PLT 570*  < > 377  MCV 94.1  < > 93.2  MCH 28.0  < > 27.9  MCHC 29.8*  < >  30.0  RDW 16.2*  < > 16.9*  LYMPHSABS 1.5  --   --   MONOABS 0.4  --   --   EOSABS 0.5  --   --   BASOSABS 0.0  --   --   < > = values in this interval not displayed.  HEMOGLOBIN A1C Lab Results  Component Value Date   HGBA1C 4.8 06/08/2016   MPG 91 06/08/2016    Cardiac Panel (last 3 results)  Recent Labs  06/18/16 0919 06/18/16 1435 06/18/16 2031  TROPONINI <0.03 0.03* 0.03*    TSH  Recent Labs  06/17/16 1429  TSH 6.936*    CHOLESTEROL  Recent Labs  06/08/16 0915  CHOL 105    Hepatic Function Panel  Recent Labs  06/04/16 0338 06/19/16 0238  06/21/16 0507  06/24/16 0354 06/25/16 0641 06/26/16 0418  PROT 5.9* 7.2  --  6.6  --   --   --   --   ALBUMIN 2.9* 3.0*  < > 2.9*  < > 2.7* 2.6* 2.6*  AST 48* 22  --  23  --   --   --   --   ALT 7* 8*  --  13*  --   --   --   --   ALKPHOS 52 145*  --  128*  --   --   --   --  BILITOT 1.5* 1.3*  --  0.9  --   --   --   --   < > = values in this interval not displayed.  Imaging: Imaging results have been reviewed  Cardiac Studies:  2D Echocardiogram /19/2018: - Left ventricle: The cavity size was normal. There was severeconcentric hypertrophy. Systolic function was vigorous. Theestimated ejection fraction was in the range of 65% to 70%. Wallmotion was normal; there were no regional wall motionabnormalities. Features are consistent with a pseudonormal leftventricular filling pattern, with concomitant abnormal relaxationand increased filling pressure (grade 2 diastolic dysfunction). - Left atrium: The atrium was mildly dilated. - Tricuspid valve: There was mild regurgitation. - Pulmonary arteries: Systolic pressure was mildly increased. PApeak pressure: 39 mm Hg (S). Impressions: No cardiac source of emboli was indentified.  EKG 06/11/2016:Atrial fibrillation with controlled ventricular response at the rate of 81 bpm, left axis deviation, no evidence of ischemia. Normal QT interval. 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  Scheduled Meds: . aspirin  325 mg Per Tube Daily  . carbidopa-levodopa  1.5 tablet Per Tube QID  . chlorhexidine  15 mL Mouth Rinse BID  . docusate  100 mg Per Tube BID  . feeding supplement (PRO-STAT SUGAR FREE 64)  30 mL Per Tube BID  . free water  250 mL Per Tube TID WC & HS  . insulin aspart  0-9 Units Subcutaneous Q4H  . mouth rinse  15 mL Mouth Rinse q12n4p  . pantoprazole sodium  40 mg Per Tube Daily  . polyethylene glycol  17 g Per Tube QODAY   Continuous Infusions: . feeding supplement (NEPRO CARB STEADY) 1,000 mL (06/26/16 0650)  . heparin 1,450 Units/hr (06/26/16 1139)   PRN Meds:.acetaminophen (TYLENOL) oral liquid 160 mg/5 mL, albuterol, alum & mag hydroxide-simeth, bisacodyl, diphenhydrAMINE, guaiFENesin-dextromethorphan, ipratropium-albuterol, methocarbamol, prochlorperazine **OR** prochlorperazine **OR** prochlorperazine, sodium phosphate, traZODone   Assessment/Plan:  1. Paroxysmal atrial fibrillation,  back to sinus rhythm presently on IV heparin. CHA2DS2-VASCScore: Risk Score 6, Yearly risk of stroke 9.8. Recommendation: Anticoagulation. 2. Obstructive uropathy and acute renal failure now resolving. 3. Coronary artery disease with history of CABGOn 09/16/2012 with LIMA to OM1 for an occluded proximal dominant circumflex coronary artery and postoperative atrial fibrillation with no recurrence until now. 4. Chronic diastolic heart failure, now dehydrated, getting free fluid. Free water. Once obstructive uropathy has been dealt with, patient has had excellent diuresis, presently off of all diuretics. Anasarca is completely resolved. 5. Hyperlipidemia 6. Symptomatic right carotid artery stenosis, high-grade needs endarterectomy. 7. Parkinson's disease  Recommendation: Patient is presently on IV heparin, Urology is on board, has immature area which is gross. once he is able to tolerate  oral feeds, would recommend switching him to Coumadin or Eliquis, and eventually will need right carotid endarterectomy when he is clinically stable.   Otherwise from cardiac standpoint he is doing well, call if questions. No new recommendations from cardiac standpoint.    Yates Decamp, M.D. 06/26/2016, 3:19 PM [facility] Pager: [phone] Office: [phone] If no answer: [phone]

## 2016-06-26 NOTE — Progress Notes (Signed)
Occupational Therapy Session Note  Patient Details  Name: Gregory LoronRichard Buick MRN: [id number] Date of Birth: 20-Aug-1940  Today's Date: 06/26/2016 OT Individual Time: [id number] OT Individual Time Calculation (min): 64 min    Short Term Goals: Week 1:  OT Short Term Goal 1 (Week 1): Pt will tolerate sitting EOB for 5 mins to engage in grooming tasks OT Short Term Goal 2 (Week 1): Pt will complete bed mobility with max assist of 1 caregiver to engage in functional tasks OT Short Term Goal 3 (Week 1): Pt will engage in bathing at bed level with mod cues and max assist of one caregiver OT Short Term Goal 4 (Week 1): Pt will complete toilet transfers with mod assist of one caregiver   Skilled Therapeutic Interventions/Progress Updates:    Treatment session with focus on ADL retraining with attention to arousal, following one step directions, and attention to task.  Pt in bed upon arrival with daughter present.  Discussed pt's progress and encouraged daughter to bring in clothing for pt.  Pt expressing desire to have hair washed.  Approved to have heparin stopped briefly to allow shower.  Max assist bed mobility due to rib pain.  Completed transfers bed > tub bench in room shower with use of Stedy for time management.  Pt completed all transfers with +2 for safety and management of Stedy, however able to pull up into standing with mod assist and use of Stedy bar.  Mod multimodal cues for attention to task and sequencing during bathing due to fatigue.  Doffed and donned TLSO seated on tub bench and completed bathing only seated level in shower.  Therapist washed buttocks in standing in [name] with TLSO donned.  Returned to bed due to fatigue and left with son present in room and RN to return to restart IV and feeding tube.  Therapy Documentation Precautions:  Precautions Precautions: Back, Fall Precaution Comments: cortrak tube, fall (parkinson's and recent CVA) Required Braces or Orthoses: Spinal  Brace Spinal Brace: Applied in supine position, Thoracolumbosacral orthotic Restrictions Weight Bearing Restrictions: No Pain: Pain Assessment Pain Assessment: Faces Faces Pain Scale: No hurt  See Function Navigator for Current Functional Status.   Therapy/Group: Individual Therapy  Rosalio LoudHOXIE, Shahed Yeoman 06/26/2016, 12:33 PM

## 2016-06-26 NOTE — Progress Notes (Signed)
Subjective/Complaints: Daughter at bedside He is alert, awake today. Speaking with daughter  Review of systems development. Mental status     Objective: Vital Signs: Blood pressure (!) 112/59, pulse 67, temperature 97.4 F (36.3 C), temperature source Oral, resp. rate 16, height '5\' 11"'  (1.803 m), weight 95.3 kg (210 lb), SpO2 97 %. Dg Abd Portable 1v  Result Date: 06/25/2016 CLINICAL DATA:  Check feeding catheter position EXAM: PORTABLE ABDOMEN - 1 VIEW COMPARISON:  06/21/2016 FINDINGS: Feeding catheter is again identified in the third portion of the duodenum. The overall appearance is stable from the prior study. IMPRESSION: Feeding catheter as described. Electronically Signed   By: Inez Catalina M.D.   On: 06/25/2016 10:58   Results for orders placed or performed during the hospital encounter of 06/20/16 (from the past 72 hour(s))  Glucose, capillary     Status: Abnormal   Collection Time: 06/23/16  9:14 AM  Result Value Ref Range   Glucose-Capillary 119 (H) 65 - 99 mg/dL  Glucose, capillary     Status: Abnormal   Collection Time: 06/23/16 11:38 AM  Result Value Ref Range   Glucose-Capillary 108 (H) 65 - 99 mg/dL  Glucose, capillary     Status: Abnormal   Collection Time: 06/23/16  4:39 PM  Result Value Ref Range   Glucose-Capillary 113 (H) 65 - 99 mg/dL  Glucose, capillary     Status: Abnormal   Collection Time: 06/23/16  8:13 PM  Result Value Ref Range   Glucose-Capillary 109 (H) 65 - 99 mg/dL  Glucose, capillary     Status: Abnormal   Collection Time: 06/24/16 12:58 AM  Result Value Ref Range   Glucose-Capillary 118 (H) 65 - 99 mg/dL  Heparin level (unfractionated)     Status: None   Collection Time: 06/24/16  3:54 AM  Result Value Ref Range   Heparin Unfractionated 0.42 0.30 - 0.70 IU/mL    Comment:        IF HEPARIN RESULTS ARE BELOW EXPECTED VALUES, AND PATIENT DOSAGE HAS BEEN CONFIRMED, SUGGEST FOLLOW UP TESTING OF ANTITHROMBIN III LEVELS.   CBC     Status:  Abnormal   Collection Time: 06/24/16  3:54 AM  Result Value Ref Range   WBC 12.7 (H) 4.0 - 10.5 K/uL   RBC 3.59 (L) 4.22 - 5.81 MIL/uL   Hemoglobin 9.9 (L) 13.0 - 17.0 g/dL   HCT 33.8 (L) 39.0 - 52.0 %   MCV 94.2 78.0 - 100.0 fL   MCH 27.6 26.0 - 34.0 pg   MCHC 29.3 (L) 30.0 - 36.0 g/dL   RDW 16.3 (H) 11.5 - 15.5 %   Platelets 534 (H) 150 - 400 K/uL  Renal function panel     Status: Abnormal   Collection Time: 06/24/16  3:54 AM  Result Value Ref Range   Sodium 150 (H) 135 - 145 mmol/L   Potassium 3.6 3.5 - 5.1 mmol/L   Chloride 118 (H) 101 - 111 mmol/L   CO2 26 22 - 32 mmol/L   Glucose, Bld 109 (H) 65 - 99 mg/dL   BUN 39 (H) 6 - 20 mg/dL   Creatinine, Ser 1.07 0.61 - 1.24 mg/dL   Calcium 9.1 8.9 - 10.3 mg/dL   Phosphorus 2.8 2.5 - 4.6 mg/dL   Albumin 2.7 (L) 3.5 - 5.0 g/dL   GFR calc non Af Amer >60 >60 mL/min   GFR calc Af Amer >60 >60 mL/min    Comment: (NOTE) The eGFR has been calculated using  the CKD EPI equation. This calculation has not been validated in all clinical situations. eGFR's persistently <60 mL/min signify possible Chronic Kidney Disease.    Anion gap 6 5 - 15  Glucose, capillary     Status: Abnormal   Collection Time: 06/24/16  4:14 AM  Result Value Ref Range   Glucose-Capillary 110 (H) 65 - 99 mg/dL  Glucose, capillary     Status: Abnormal   Collection Time: 06/24/16  8:12 AM  Result Value Ref Range   Glucose-Capillary 110 (H) 65 - 99 mg/dL  Glucose, capillary     Status: Abnormal   Collection Time: 06/24/16 11:46 AM  Result Value Ref Range   Glucose-Capillary 114 (H) 65 - 99 mg/dL  Glucose, capillary     Status: None   Collection Time: 06/24/16  4:14 PM  Result Value Ref Range   Glucose-Capillary 99 65 - 99 mg/dL  Glucose, capillary     Status: None   Collection Time: 06/24/16  8:31 PM  Result Value Ref Range   Glucose-Capillary 91 65 - 99 mg/dL  Glucose, capillary     Status: None   Collection Time: 06/25/16 12:06 AM  Result Value Ref Range    Glucose-Capillary 69 65 - 99 mg/dL  Glucose, capillary     Status: Abnormal   Collection Time: 06/25/16 12:55 AM  Result Value Ref Range   Glucose-Capillary 109 (H) 65 - 99 mg/dL  Glucose, capillary     Status: Abnormal   Collection Time: 06/25/16  4:25 AM  Result Value Ref Range   Glucose-Capillary 62 (L) 65 - 99 mg/dL  Glucose, capillary     Status: None   Collection Time: 06/25/16  5:18 AM  Result Value Ref Range   Glucose-Capillary 96 65 - 99 mg/dL  Heparin level (unfractionated)     Status: None   Collection Time: 06/25/16  6:41 AM  Result Value Ref Range   Heparin Unfractionated 0.66 0.30 - 0.70 IU/mL    Comment:        IF HEPARIN RESULTS ARE BELOW EXPECTED VALUES, AND PATIENT DOSAGE HAS BEEN CONFIRMED, SUGGEST FOLLOW UP TESTING OF ANTITHROMBIN III LEVELS.   CBC     Status: Abnormal   Collection Time: 06/25/16  6:41 AM  Result Value Ref Range   WBC 10.7 (H) 4.0 - 10.5 K/uL   RBC 3.44 (L) 4.22 - 5.81 MIL/uL   Hemoglobin 9.5 (L) 13.0 - 17.0 g/dL   HCT 31.8 (L) 39.0 - 52.0 %   MCV 92.4 78.0 - 100.0 fL   MCH 27.6 26.0 - 34.0 pg   MCHC 29.9 (L) 30.0 - 36.0 g/dL   RDW 16.6 (H) 11.5 - 15.5 %   Platelets 414 (H) 150 - 400 K/uL  Renal function panel     Status: Abnormal   Collection Time: 06/25/16  6:41 AM  Result Value Ref Range   Sodium 147 (H) 135 - 145 mmol/L   Potassium 3.5 3.5 - 5.1 mmol/L   Chloride 118 (H) 101 - 111 mmol/L   CO2 25 22 - 32 mmol/L   Glucose, Bld 90 65 - 99 mg/dL   BUN 32 (H) 6 - 20 mg/dL   Creatinine, Ser 0.96 0.61 - 1.24 mg/dL   Calcium 8.8 (L) 8.9 - 10.3 mg/dL   Phosphorus 3.3 2.5 - 4.6 mg/dL   Albumin 2.6 (L) 3.5 - 5.0 g/dL   GFR calc non Af Amer >60 >60 mL/min   GFR calc Af Amer >60 >60 mL/min  Comment: (NOTE) The eGFR has been calculated using the CKD EPI equation. This calculation has not been validated in all clinical situations. eGFR's persistently <60 mL/min signify possible Chronic Kidney Disease.    Anion gap 4 (L) 5 - 15   Glucose, capillary     Status: None   Collection Time: 06/25/16  8:04 AM  Result Value Ref Range   Glucose-Capillary 68 65 - 99 mg/dL  Glucose, capillary     Status: None   Collection Time: 06/25/16 12:10 PM  Result Value Ref Range   Glucose-Capillary 78 65 - 99 mg/dL  Glucose, capillary     Status: None   Collection Time: 06/25/16  4:39 PM  Result Value Ref Range   Glucose-Capillary 69 65 - 99 mg/dL  Heparin level (unfractionated)     Status: None   Collection Time: 06/25/16  5:55 PM  Result Value Ref Range   Heparin Unfractionated 0.56 0.30 - 0.70 IU/mL    Comment:        IF HEPARIN RESULTS ARE BELOW EXPECTED VALUES, AND PATIENT DOSAGE HAS BEEN CONFIRMED, SUGGEST FOLLOW UP TESTING OF ANTITHROMBIN III LEVELS.   Glucose, capillary     Status: None   Collection Time: 06/25/16  7:48 PM  Result Value Ref Range   Glucose-Capillary 98 65 - 99 mg/dL  Glucose, capillary     Status: None   Collection Time: 06/25/16 11:56 PM  Result Value Ref Range   Glucose-Capillary 82 65 - 99 mg/dL   Comment 1 Notify RN   Heparin level (unfractionated)     Status: None   Collection Time: 06/26/16  4:18 AM  Result Value Ref Range   Heparin Unfractionated 0.50 0.30 - 0.70 IU/mL    Comment:        IF HEPARIN RESULTS ARE BELOW EXPECTED VALUES, AND PATIENT DOSAGE HAS BEEN CONFIRMED, SUGGEST FOLLOW UP TESTING OF ANTITHROMBIN III LEVELS.   CBC     Status: Abnormal   Collection Time: 06/26/16  4:18 AM  Result Value Ref Range   WBC 9.7 4.0 - 10.5 K/uL   RBC 3.51 (L) 4.22 - 5.81 MIL/uL   Hemoglobin 9.8 (L) 13.0 - 17.0 g/dL   HCT 32.7 (L) 39.0 - 52.0 %   MCV 93.2 78.0 - 100.0 fL   MCH 27.9 26.0 - 34.0 pg   MCHC 30.0 30.0 - 36.0 g/dL   RDW 16.9 (H) 11.5 - 15.5 %   Platelets 377 150 - 400 K/uL  Renal function panel     Status: Abnormal   Collection Time: 06/26/16  4:18 AM  Result Value Ref Range   Sodium 147 (H) 135 - 145 mmol/L   Potassium 3.5 3.5 - 5.1 mmol/L   Chloride 114 (H) 101 - 111  mmol/L   CO2 23 22 - 32 mmol/L   Glucose, Bld 114 (H) 65 - 99 mg/dL   BUN 37 (H) 6 - 20 mg/dL   Creatinine, Ser 1.04 0.61 - 1.24 mg/dL   Calcium 8.9 8.9 - 10.3 mg/dL   Phosphorus 3.3 2.5 - 4.6 mg/dL   Albumin 2.6 (L) 3.5 - 5.0 g/dL   GFR calc non Af Amer >60 >60 mL/min   GFR calc Af Amer >60 >60 mL/min    Comment: (NOTE) The eGFR has been calculated using the CKD EPI equation. This calculation has not been validated in all clinical situations. eGFR's persistently <60 mL/min signify possible Chronic Kidney Disease.    Anion gap 10 5 - 15  Glucose, capillary  Status: Abnormal   Collection Time: 06/26/16  4:51 AM  Result Value Ref Range   Glucose-Capillary 108 (H) 65 - 99 mg/dL   Comment 1 Notify RN   Glucose, capillary     Status: None   Collection Time: 06/26/16  7:50 AM  Result Value Ref Range   Glucose-Capillary 90 65 - 99 mg/dL      General: No acute distress Mood and affect are appropriate Heart: Irregularly irregular Lungs: Clear to auscultation, breathing unlabored, no rales or wheezes Abdomen: Positive bowel sounds, soft nontender to palpation, nondistended Extremities: No clubbing, cyanosis, or edema Skin: No evidence of breakdown, no evidence of rash Neurologic: Cranial nerves II through XII intact, motor strength is 4/5 in bilateral deltoid, bicep, tricep, grip, hip flexor, knee extensors, ankle dorsiflexor and plantar flexor Sensory exam normal sensation to light touch and proprioception in bilateral upper and lower extremities Movements are slow. Needs encouragement and cueing Musculoskeletal: No pain with upper or lower extremity muscle testing.   Assessment/Plan: 1. 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 which require 3+ hours per day of interdisciplinary therapy in a comprehensive inpatient rehab setting. Physiatrist is providing close team supervision and 24 hour management of active  medical problems listed below. Physiatrist and rehab team continue to assess barriers to discharge/monitor patient progress toward functional and medical goals. FIM: Function - Bathing Position: Wheelchair/chair at sink Body parts bathed by patient: Right arm, Left arm, Chest, Abdomen, Right upper leg Body parts bathed by helper: Front perineal area, Buttocks, Left upper leg, Right lower leg, Left lower leg, Back Assist Level: 2 helpers  Function- Upper Body Dressing/Undressing What is the patient wearing?: Hospital gown, Orthosis Orthosis activity level: 2 helpers Assist Level: 2 helpers Function - Lower Armed forces technical officer What is the patient wearing?: Pants, Non-skid slipper socks Position: Wheelchair/chair at sink Pants- Performed by helper: Thread/unthread right pants leg, Thread/unthread left pants leg, Pull pants up/down Non-skid slipper socks- Performed by helper: Don/doff right sock, Don/doff left sock Assist for footwear: Dependant Assist for lower body dressing: 2 Helpers  Function - Toileting Toileting activity did not occur: No continent bowel/bladder event Toileting steps completed by helper: Adjust clothing prior to toileting, Performs perineal hygiene, Adjust clothing after toileting Toileting Assistive Devices: Grab bar or rail Assist level: Two helpers  Function - Air cabin crew transfer assistive device: Bedside commode Assist level to toilet: Moderate assist (Pt 50 - 74%/lift or lower) Assist level from toilet: Moderate assist (Pt 50 - 74%/lift or lower) Assist level to bedside commode (at bedside): 2 helpers (fatigued) Assist level from bedside commode (at bedside): 2 helpers  Function - Chair/bed transfer Chair/bed transfer method: Ambulatory Chair/bed transfer assist level: Moderate assist (Pt 50 - 74%/lift or lower) Chair/bed transfer assistive device: Armrests, Walker Chair/bed transfer details: Manual facilitation for weight bearing,  Manual facilitation for placement, Verbal cues for technique, Tactile cues for posture, Verbal cues for sequencing  Function - Locomotion: Wheelchair Will patient use wheelchair at discharge?: Yes Type: Manual Max wheelchair distance: 150 Assist Level: Dependent (Pt equals 0%) Assist Level: Dependent (Pt equals 0%) Assist Level: Dependent (Pt equals 0%) Turns around,maneuvers to table,bed, and toilet,negotiates 3% grade,maneuvers on rugs and over doorsills: No Function - Locomotion: Ambulation Assistive device: Walker-rolling Max distance: 63 Assist level: Moderate assist (Pt 50 - 74%) Walk 10 feet activity did not occur: Safety/medical concerns Assist level: Moderate assist (Pt 50 - 74%) Walk 50 feet with 2 turns activity  did not occur: Safety/medical concerns Assist level: Moderate assist (Pt 50 - 74%) Walk 150 feet activity did not occur: Safety/medical concerns Walk 10 feet on uneven surfaces activity did not occur: Safety/medical concerns  Function - Comprehension Comprehension: Auditory Comprehension assist level: Understands basic 25 - 49% of the time/ requires cueing 50 - 75% of the time  Function - Expression Expression: Verbal Expression assist level: Expresses basic 25 - 49% of the time/requires cueing 50 - 75% of the time. Uses single words/gestures.  Function - Social Interaction Social Interaction assist level: Interacts appropriately 25 - 49% of time - Needs frequent redirection.  Function - Problem Solving Problem solving assist level: Solves basic 25 - 49% of the time - needs direction more than half the time to initiate, plan or complete simple activities  Function - Memory Memory assist level: Recognizes or recalls less than 25% of the time/requires cueing greater than 75% of the time Patient normally able to recall (first 3 days only): Current season, That he or she is in a hospital  Medical Problem List and Plan: 1. 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 Long discussion with daughter related to patient's prognosis, as well as, which deficits are caused by stroke and which deficits are caused by trauma. 2. DVT Prophylaxis/Anticoagulation: Pharmaceutical: Heparin, until taking by mouth 3. Pain Management: tylenol prn 4. Mood: Hard to determine at this time due cognitive impairments. LCSW to follow along for evaluation as appropriate.  5. Neuropsych: This patient is not capable of making decisions on his own behalf. 6. Skin/Wound Care: routine pressure relief measures.  7. Fluids/Electrolytes/Nutrition: Add water flushes to help with hydration.  Still nothing by mouth but hopefully will pass swallow test. As discussed with daughter do not expect any quick improvement with this 8. Parkinsonism:On sinemet qid.  9. PAF: Now in NSR and controlled at this time. Monitor HR bid. Continue heparin per Pharm protocol--Eliquis once able to start po intake.  10. Acute renal injury: Due to BOO. Continue foley for monitoring I/O. Renal status improving  11. Acute Congestive heart failure: Monitor daily weights--down to 203 lbs. Monitor for signs of overload. 12. UTI: Completed Cipro, rechecked UA +, C&S, negative 13. Hematuria: Continue to monitor output for now. Hemoglobins have been in the 9-10 range. One value of 11, which was spurious Given chronicity, ask urology to evaluate  14. Hypernatremia: improving 147 this morning continue water flushes.  15. T12 fracture. Stable, per neurosurgery, may don and doff orthosis while seated on bed, shower with orthosis off 16.  Pre renal azotemia, we'll need to increase H2O flushes, has had problems with keeping feeding tube in place, which is contributing to this issue LOS (Days) 6 A FACE TO FACE EVALUATION WAS PERFORMED  Gregory [name] E 06/26/2016, 8:54 AM

## 2016-06-26 NOTE — Progress Notes (Signed)
Assisted patient back to bed.  Performed foley care and irrigated foley per MD orders.  Placed foley to traction as instructed.  Gross hematuria still noted for output.  Dani Gobbleeardon, Jordi Lacko J, RN

## 2016-06-27 ENCOUNTER — Inpatient Hospital Stay (HOSPITAL_COMMUNITY): Payer: Medicare Other | Admitting: Occupational Therapy

## 2016-06-27 ENCOUNTER — Inpatient Hospital Stay (HOSPITAL_COMMUNITY): Payer: Medicare Other | Admitting: Physical Therapy

## 2016-06-27 ENCOUNTER — Inpatient Hospital Stay (HOSPITAL_COMMUNITY): Payer: Medicare Other | Admitting: Speech Pathology

## 2016-06-27 DIAGNOSIS — R339 Retention of urine, unspecified: Secondary | ICD-10-CM

## 2016-06-27 LAB — GLUCOSE, CAPILLARY
GLUCOSE-CAPILLARY: 81 mg/dL (ref 65–99)
GLUCOSE-CAPILLARY: 79 mg/dL (ref 65–99)
GLUCOSE-CAPILLARY: 68 mg/dL (ref 65–99)
GLUCOSE-CAPILLARY: 87 mg/dL (ref 65–99)
Glucose-Capillary: 100 mg/dL — ABNORMAL HIGH (ref 65–99)
Glucose-Capillary: 72 mg/dL (ref 65–99)
Glucose-Capillary: 80 mg/dL (ref 65–99)
Glucose-Capillary: 83 mg/dL (ref 65–99)

## 2016-06-27 LAB — RENAL FUNCTION PANEL
Albumin: 2.6 g/dL — ABNORMAL LOW (ref 3.5–5.0)
Anion gap: 7 (ref 5–15)
BUN: 27 mg/dL — AB (ref 6–20)
CHLORIDE: 111 mmol/L (ref 101–111)
CO2: 26 mmol/L (ref 22–32)
Calcium: 9.1 mg/dL (ref 8.9–10.3)
Creatinine, Ser: 0.85 mg/dL (ref 0.61–1.24)
GFR calc non Af Amer: >60 mL/min (ref >60)
Glucose, Bld: 110 mg/dL — ABNORMAL HIGH (ref 65–99)
POTASSIUM: 3.5 mmol/L (ref 3.5–5.1)
Phosphorus: 2.8 mg/dL (ref 2.5–4.6)
Sodium: 144 mmol/L (ref 135–145)

## 2016-06-27 LAB — URINE CULTURE: Culture: NO GROWTH

## 2016-06-27 LAB — CBC
HEMATOCRIT: 34 % — AB (ref 39.0–52.0)
Hemoglobin: 10.3 g/dL — ABNORMAL LOW (ref 13.0–17.0)
MCH: 27.8 pg (ref 26.0–34.0)
MCHC: 30.3 g/dL (ref 30.0–36.0)
MCV: 91.6 fL (ref 78.0–100.0)
PLATELETS: 383 10*3/uL (ref 150–400)
RBC: 3.71 MIL/uL — ABNORMAL LOW (ref 4.22–5.81)
RDW: 16.9 % — AB (ref 11.5–15.5)
WBC: 9.1 10*3/uL (ref 4.0–10.5)

## 2016-06-27 LAB — HEPARIN LEVEL (UNFRACTIONATED): Heparin Unfractionated: 0.34 IU/mL (ref 0.30–0.70)

## 2016-06-27 MED ORDER — METHYLPHENIDATE HCL 5 MG PO TABS
5.0000 mg | ORAL_TABLET | Freq: Two times a day (BID) | ORAL | Status: DC
  Administered 2016-06-27 – 2016-06-29 (×5): 5 mg via ORAL
  Filled 2016-06-27 (×5): qty 1

## 2016-06-27 NOTE — Progress Notes (Signed)
Physical Therapy Session Note  Patient Details  Name: Gregory [name] MRN: [id number] Date of Birth: 04/14/1941  Today's Date: 06/27/2016 PT Individual Time: 0900-1000 PT Individual Time Calculation (min): 60 min   Short Term Goals: Week 1:  PT Short Term Goal 1 (Week 1): Pt will demonstrate bed mobility with mod assist and use of bed features PT Short Term Goal 2 (Week 1): Pt will transfer with +1 assist consistently PT Short Term Goal 3 (Week 1): Pt will ambulate 30' with +2 assist PT Short Term Goal 4 (Week 1): Pt will propel w/c 20' wtih min assist  Skilled Therapeutic Interventions/Progress Updates:    no c/o pain, session focus on activity tolerance and functional mobility.    Pt rolled L/R with total assist to don brief.  Total assist for supine>sit with HOB elevated and using bed rails.  Sit<>stand throughout session with mod assist to rise, but pt requires max>+2 assist for pivot to w/c on R due to decreased motor planning and decreased ability to follow 1 step commands during activity.  Standing tolerance during pipe tree activity with supervision>min assist to stand and mod multimodal cues and increased time to complete 2 simple pipe tree designs.  Pt becoming more lethargic towards end of session and requesting to return to room.  W/C propulsion x50' with min assist for maintaining straight pathway and cues for arousal.  Pt positioned in room at end of session with QRB, soft wrist restraints, and mittens in place, call bell in lap and needs met.   Therapy Documentation Precautions:  Precautions Precautions: Back, Fall Precaution Comments: cortrak tube, fall (parkinson's and recent CVA) Required Braces or Orthoses: Spinal Brace Spinal Brace: Applied in supine position, Thoracolumbosacral orthotic Restrictions Weight Bearing Restrictions: No   See Function Navigator for Current Functional Status.   Therapy/Group: Individual Therapy   E Penven-Crew 06/27/2016, 10:48  AM  

## 2016-06-27 NOTE — Progress Notes (Signed)
ANTICOAGULATION CONSULT NOTE - Follow Up Consult  Pharmacy Consult for heparin Indication: CVA /afib  No Known Allergies  Patient Measurements: Height: 5\' 11"  (180.3 cm) Weight: 202 lb 9.6 oz (91.9 kg) IBW/kg (Calculated) : 75.3  Vital Signs: Temp: 98.1 F (36.7 C) (02/07 0458) Temp Source: Oral (02/07 0458) BP: 117/75 (02/07 0458) Pulse Rate: 68 (02/07 0458)  Labs:  Recent Labs  06/25/16 0641 06/25/16 1755 06/26/16 0418 06/27/16 0832  HGB 9.5*  --  9.8* 10.3*  HCT 31.8*  --  32.7* 34.0*  PLT 414*  --  377 383  HEPARINUNFRC 0.66 0.56 0.50 0.34  CREATININE 0.96  --  1.04  --     Estimated Creatinine Clearance: 70 mL/min (by C-G formula based on SCr of 1.04 mg/dL).  Infusions:  . feeding supplement (NEPRO CARB STEADY) 1,000 mL (06/26/16 0650)  . 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 Heparin level 0.34 on 1450 units/hr this morning, hgb 10.3, pltc 383 K, stable. Will switch to oral anticoagulation if pt can swallow.   Goal of Therapy:  Heparin level 0.3-0.5 units/ml Monitor platelets by anticoagulation protocol: Yes   Plan:  - Continue heparin gtt 1450 units/hr - Daily heparin level and CBC  - f/u transitioning to oral anticoag when taking PO   Bayard HuggerMei Brexley Cutshaw, PharmD, BCPS  Clinical Pharmacist  Pager: [phone]  06/27/16 9:33 AM

## 2016-06-27 NOTE — Progress Notes (Signed)
Subjective/Complaints: Appreciate cardiology and urology notes. Minimal improvement on swallowing study as noted below, starting ice chips  Review of systems development. Mental status     Objective: Vital Signs: Blood pressure 117/75, pulse 68, temperature 98.1 F (36.7 C), temperature source Oral, resp. rate 18, height '5\' 11"'  (1.803 m), weight 91.9 kg (202 lb 9.6 oz), SpO2 100 %. Dg Abd Portable 1v  Result Date: 06/25/2016 CLINICAL DATA:  Check feeding catheter position EXAM: PORTABLE ABDOMEN - 1 VIEW COMPARISON:  06/21/2016 FINDINGS: Feeding catheter is again identified in the third portion of the duodenum. The overall appearance is stable from the prior study. IMPRESSION: Feeding catheter as described. Electronically Signed   By: Inez Catalina M.D.   On: 06/25/2016 10:58   Dg Swallowing Func-speech Pathology  Result Date: 06/26/2016 Objective Swallowing Evaluation: Type of Study: MBS-Modified Barium Swallow Study Patient Details Name: Gregory [name] MRN: [id number] Date of Birth: 09-15-1940 Today's Date: 06/26/2016 Time: SLP Start Time : 0900-SLP Stop Time: 0930 SLP Time Calculation (min) : 30 min Past Medical History: Past Medical History: Diagnosis Date . AAA (abdominal aortic aneurysm) ([place])  . Abnormal stress test 08/08/12 . AF (atrial fibrillation) ([place]) 2014 . Atrial fibrillation ([place])  . Bilateral carotid artery stenosis 08/14/12  moderate bilaterally per carotid duplex . CAD (coronary artery disease)  . Coronary artery disease  . Deformed pylorus, acquired  . Erythema of esophagus  . Esophageal stricture  . Fatty infiltration of liver  . Fatty liver  . GERD (gastroesophageal reflux disease)  . History of esophageal stricture  . Hyperlipidemia  . Parkinsonism ([place])  . S/P cardiac cath 08/12/12 . Splenomegaly  Past Surgical History: Past Surgical History: Procedure Laterality Date . COLONOSCOPY  2004 . CORONARY ARTERY BYPASS GRAFT   . CORONARY ARTERY BYPASS GRAFT N/A 09/16/2012  Procedure:  CORONARY ARTERY BYPASS GRAFTING (CABG) TIMES ONE USING LEFT INTERNAL MAMMARY ARTERY;  Surgeon: Gaye Pollack, MD;  Location: [facility];  Service: Open Heart Surgery;  Laterality: N/A; . EYE SURGERY  2012  left cataract extraction . INNER EAR SURGERY  1995  Dr. Sherre Lain...placement of shunt . LEFT HEART CATHETERIZATION WITH CORONARY ANGIOGRAM N/A 08/19/2012  Procedure: LEFT HEART CATHETERIZATION WITH CORONARY ANGIOGRAM;  Surgeon: [name], MD;  Location: [facility] CATH LAB;  Service: Cardiovascular;  Laterality: N/A; HPI: See H&P Subjective: pt alert but confused Assessment / Plan / Recommendation CHL IP CLINICAL IMPRESSIONS 06/26/2016 Therapy Diagnosis Moderate oral phase dysphagia Clinical Impression Patient has a moderate pharyngeal dysphagia due to sensory but predominantly motor deficits. Patient demonstrates a delayed swallow initiation to the valleculae with decreased base of tongue retraction, decreased epiglottic inversion, and decreased hyolaryngeal excursion and elevation resulting in diffuse pharyngeal residue with all consistencies tested. Patient required 3-4 swallows to clear residuals with eventual penetration with all consistencies and trace aspiration of residue down posterior tracheal wall with thin liquids, especially as study continued and patient demonstrated increased fatigue and decreased endurance. Compensatory strategies were unsuccessful, suspect due to fatigue and decreased ability to follow commands. Recommend patient remain NPO with temporary means of nutrition but initiate the ice chip protocol and focus on trials of nectar-thick liquids via tsp with SLP along with pharyngeal strengthening exercises. Prognosis for PO intake is good, especially as cognition improves. Thorough education provided to patient and daughter at bedside.  Impact on safety and function Moderate aspiration risk   CHL IP TREATMENT RECOMMENDATION 06/26/2016 Treatment Recommendations Therapy as outlined in treatment plan  below   Prognosis 06/26/2016  Prognosis for Safe Diet Advancement Good Barriers to Reach Goals Cognitive deficits Barriers/Prognosis Comment -- CHL IP DIET RECOMMENDATION 06/26/2016 SLP Diet Recommendations NPO;Alternative means - temporary Liquid Administration via -- Medication Administration Via alternative means Compensations -- Postural Changes --   CHL IP OTHER RECOMMENDATIONS 06/26/2016 Recommended Consults -- Oral Care Recommendations Oral care QID Other Recommendations --   CHL IP FOLLOW UP RECOMMENDATIONS 06/18/2016 Follow up Recommendations Inpatient Rehab   CHL IP FREQUENCY AND DURATION 06/26/2016 Speech Therapy Frequency (ACUTE ONLY) min 5x/week Treatment Duration 3 weeks      CHL IP ORAL PHASE 06/26/2016 Oral Phase Impaired Oral - Pudding Teaspoon -- Oral - Pudding Cup -- Oral - Honey Teaspoon WFL Oral - Honey Cup -- Oral - Nectar Teaspoon Left anterior bolus loss;Right anterior bolus loss;Decreased bolus cohesion Oral - Nectar Cup Left anterior bolus loss;Right anterior bolus loss;Decreased bolus cohesion Oral - Nectar Straw -- Oral - Thin Teaspoon Left anterior bolus loss;Right anterior bolus loss Oral - Thin Cup -- Oral - Thin Straw -- Oral - Puree -- Oral - Mech Soft -- Oral - Regular -- Oral - Multi-Consistency -- Oral - Pill -- Oral Phase - Comment --  CHL IP PHARYNGEAL PHASE 06/26/2016 Pharyngeal Phase Impaired Pharyngeal- Pudding Teaspoon -- Pharyngeal -- Pharyngeal- Pudding Cup -- Pharyngeal -- Pharyngeal- Honey Teaspoon Delayed swallow initiation-vallecula;Reduced epiglottic inversion;Reduced airway/laryngeal closure;Reduced tongue base retraction;Reduced anterior laryngeal mobility;Reduced laryngeal elevation;Penetration/Apiration after swallow;Pharyngeal residue - pyriform;Pharyngeal residue - valleculae;Pharyngeal residue - cp segment;Lateral channel residue Pharyngeal Material enters airway, CONTACTS cords and then ejected out Pharyngeal- Honey Cup -- Pharyngeal -- Pharyngeal- Nectar Teaspoon Delayed  swallow initiation-vallecula;Reduced epiglottic inversion;Reduced anterior laryngeal mobility;Reduced laryngeal elevation;Reduced airway/laryngeal closure;Reduced tongue base retraction;Penetration/Apiration after swallow;Pharyngeal residue - cp segment;Lateral channel residue;Pharyngeal residue - pyriform;Pharyngeal residue - valleculae Pharyngeal Material enters airway, remains ABOVE vocal cords and not ejected out Pharyngeal- Nectar Cup Delayed swallow initiation-vallecula;Penetration/Apiration after swallow;Pharyngeal residue - valleculae;Pharyngeal residue - pyriform;Lateral channel residue;Trace aspiration;Reduced anterior laryngeal mobility;Reduced laryngeal elevation;Reduced epiglottic inversion;Reduced airway/laryngeal closure Pharyngeal Material enters airway, remains ABOVE vocal cords and not ejected out Pharyngeal- Nectar Straw -- Pharyngeal -- Pharyngeal- Thin Teaspoon Delayed swallow initiation-vallecula;Reduced airway/laryngeal closure;Reduced tongue base retraction;Reduced laryngeal elevation;Reduced anterior laryngeal mobility;Reduced epiglottic inversion;Penetration/Apiration after swallow;Pharyngeal residue - valleculae;Pharyngeal residue - pyriform;Lateral channel residue;Pharyngeal residue - cp segment;Trace aspiration Pharyngeal Material enters airway, passes BELOW cords without attempt by patient to eject out (silent aspiration) Pharyngeal- Thin Cup -- Pharyngeal -- Pharyngeal- Thin Straw -- Pharyngeal -- Pharyngeal- Puree NT Pharyngeal -- Pharyngeal- Mechanical Soft -- Pharyngeal -- Pharyngeal- Regular -- Pharyngeal -- Pharyngeal- Multi-consistency -- Pharyngeal -- Pharyngeal- Pill -- Pharyngeal -- Pharyngeal Comment --  No flowsheet data found. [place], [place] 06/26/2016, 3:28 PM              Results for orders placed or performed during the hospital encounter of 06/20/16 (from the past 72 hour(s))  Glucose, capillary     Status: Abnormal   Collection Time: 06/24/16 11:46 AM  Result Value  Ref Range   Glucose-Capillary 114 (H) 65 - 99 mg/dL  Glucose, capillary     Status: None   Collection Time: 06/24/16  4:14 PM  Result Value Ref Range   Glucose-Capillary 99 65 - 99 mg/dL  Glucose, capillary     Status: None   Collection Time: 06/24/16  8:31 PM  Result Value Ref Range   Glucose-Capillary 91 65 - 99 mg/dL  Glucose, capillary     Status: None   Collection Time: 06/25/16 12:06 AM  Result  Value Ref Range   Glucose-Capillary 69 65 - 99 mg/dL  Glucose, capillary     Status: Abnormal   Collection Time: 06/25/16 12:55 AM  Result Value Ref Range   Glucose-Capillary 109 (H) 65 - 99 mg/dL  Glucose, capillary     Status: Abnormal   Collection Time: 06/25/16  4:25 AM  Result Value Ref Range   Glucose-Capillary 62 (L) 65 - 99 mg/dL  Glucose, capillary     Status: None   Collection Time: 06/25/16  5:18 AM  Result Value Ref Range   Glucose-Capillary 96 65 - 99 mg/dL  Heparin level (unfractionated)     Status: None   Collection Time: 06/25/16  6:41 AM  Result Value Ref Range   Heparin Unfractionated 0.66 0.30 - 0.70 IU/mL    Comment:        IF HEPARIN RESULTS ARE BELOW EXPECTED VALUES, AND PATIENT DOSAGE HAS BEEN CONFIRMED, SUGGEST FOLLOW UP TESTING OF ANTITHROMBIN III LEVELS.   CBC     Status: Abnormal   Collection Time: 06/25/16  6:41 AM  Result Value Ref Range   WBC 10.7 (H) 4.0 - 10.5 K/uL   RBC 3.44 (L) 4.22 - 5.81 MIL/uL   Hemoglobin 9.5 (L) 13.0 - 17.0 g/dL   HCT 31.8 (L) 39.0 - 52.0 %   MCV 92.4 78.0 - 100.0 fL   MCH 27.6 26.0 - 34.0 pg   MCHC 29.9 (L) 30.0 - 36.0 g/dL   RDW 16.6 (H) 11.5 - 15.5 %   Platelets 414 (H) 150 - 400 K/uL  Renal function panel     Status: Abnormal   Collection Time: 06/25/16  6:41 AM  Result Value Ref Range   Sodium 147 (H) 135 - 145 mmol/L   Potassium 3.5 3.5 - 5.1 mmol/L   Chloride 118 (H) 101 - 111 mmol/L   CO2 25 22 - 32 mmol/L   Glucose, Bld 90 65 - 99 mg/dL   BUN 32 (H) 6 - 20 mg/dL   Creatinine, Ser 0.96 0.61 - 1.24  mg/dL   Calcium 8.8 (L) 8.9 - 10.3 mg/dL   Phosphorus 3.3 2.5 - 4.6 mg/dL   Albumin 2.6 (L) 3.5 - 5.0 g/dL   GFR calc non Af Amer >60 >60 mL/min   GFR calc Af Amer >60 >60 mL/min    Comment: (NOTE) The eGFR has been calculated using the CKD EPI equation. This calculation has not been validated in all clinical situations. eGFR's persistently <60 mL/min signify possible Chronic Kidney Disease.    Anion gap 4 (L) 5 - 15  Glucose, capillary     Status: None   Collection Time: 06/25/16  8:04 AM  Result Value Ref Range   Glucose-Capillary 68 65 - 99 mg/dL  Glucose, capillary     Status: None   Collection Time: 06/25/16 12:10 PM  Result Value Ref Range   Glucose-Capillary 78 65 - 99 mg/dL  Glucose, capillary     Status: None   Collection Time: 06/25/16  4:39 PM  Result Value Ref Range   Glucose-Capillary 69 65 - 99 mg/dL  Heparin level (unfractionated)     Status: None   Collection Time: 06/25/16  5:55 PM  Result Value Ref Range   Heparin Unfractionated 0.56 0.30 - 0.70 IU/mL    Comment:        IF HEPARIN RESULTS ARE BELOW EXPECTED VALUES, AND PATIENT DOSAGE HAS BEEN CONFIRMED, SUGGEST FOLLOW UP TESTING OF ANTITHROMBIN III LEVELS.   Glucose, capillary  Status: None   Collection Time: 06/25/16  7:48 PM  Result Value Ref Range   Glucose-Capillary 98 65 - 99 mg/dL  Glucose, capillary     Status: None   Collection Time: 06/25/16 11:56 PM  Result Value Ref Range   Glucose-Capillary 82 65 - 99 mg/dL   Comment 1 Notify RN   Heparin level (unfractionated)     Status: None   Collection Time: 06/26/16  4:18 AM  Result Value Ref Range   Heparin Unfractionated 0.50 0.30 - 0.70 IU/mL    Comment:        IF HEPARIN RESULTS ARE BELOW EXPECTED VALUES, AND PATIENT DOSAGE HAS BEEN CONFIRMED, SUGGEST FOLLOW UP TESTING OF ANTITHROMBIN III LEVELS.   CBC     Status: Abnormal   Collection Time: 06/26/16  4:18 AM  Result Value Ref Range   WBC 9.7 4.0 - 10.5 K/uL   RBC 3.51 (L) 4.22 -  5.81 MIL/uL   Hemoglobin 9.8 (L) 13.0 - 17.0 g/dL   HCT 32.7 (L) 39.0 - 52.0 %   MCV 93.2 78.0 - 100.0 fL   MCH 27.9 26.0 - 34.0 pg   MCHC 30.0 30.0 - 36.0 g/dL   RDW 16.9 (H) 11.5 - 15.5 %   Platelets 377 150 - 400 K/uL  Renal function panel     Status: Abnormal   Collection Time: 06/26/16  4:18 AM  Result Value Ref Range   Sodium 147 (H) 135 - 145 mmol/L   Potassium 3.5 3.5 - 5.1 mmol/L   Chloride 114 (H) 101 - 111 mmol/L   CO2 23 22 - 32 mmol/L   Glucose, Bld 114 (H) 65 - 99 mg/dL   BUN 37 (H) 6 - 20 mg/dL   Creatinine, Ser 1.04 0.61 - 1.24 mg/dL   Calcium 8.9 8.9 - 10.3 mg/dL   Phosphorus 3.3 2.5 - 4.6 mg/dL   Albumin 2.6 (L) 3.5 - 5.0 g/dL   GFR calc non Af Amer >60 >60 mL/min   GFR calc Af Amer >60 >60 mL/min    Comment: (NOTE) The eGFR has been calculated using the CKD EPI equation. This calculation has not been validated in all clinical situations. eGFR's persistently <60 mL/min signify possible Chronic Kidney Disease.    Anion gap 10 5 - 15  Glucose, capillary     Status: Abnormal   Collection Time: 06/26/16  4:51 AM  Result Value Ref Range   Glucose-Capillary 108 (H) 65 - 99 mg/dL   Comment 1 Notify RN   Glucose, capillary     Status: None   Collection Time: 06/26/16  7:50 AM  Result Value Ref Range   Glucose-Capillary 90 65 - 99 mg/dL  Glucose, capillary     Status: None   Collection Time: 06/26/16 12:01 PM  Result Value Ref Range   Glucose-Capillary 75 65 - 99 mg/dL  Glucose, capillary     Status: None   Collection Time: 06/26/16  4:44 PM  Result Value Ref Range   Glucose-Capillary 73 65 - 99 mg/dL  Glucose, capillary     Status: None   Collection Time: 06/26/16  8:10 PM  Result Value Ref Range   Glucose-Capillary 82 65 - 99 mg/dL   Comment 1 Notify RN   Glucose, capillary     Status: None   Collection Time: 06/27/16 12:26 AM  Result Value Ref Range   Glucose-Capillary 72 65 - 99 mg/dL  Glucose, capillary     Status: None   Collection Time:  06/27/16  4:36 AM  Result Value Ref Range   Glucose-Capillary 81 65 - 99 mg/dL  Glucose, capillary     Status: Abnormal   Collection Time: 06/27/16  8:14 AM  Result Value Ref Range   Glucose-Capillary 100 (H) 65 - 99 mg/dL      General: No acute distress Mood and affect are appropriate Heart: Irregularly irregular Lungs: Clear to auscultation, breathing unlabored, no rales or wheezes Abdomen: Positive bowel sounds, soft nontender to palpation, nondistended Extremities: No clubbing, cyanosis, or edema Skin: No evidence of breakdown, no evidence of rash Neurologic: Cranial nerves II through XII intact, motor strength is 4/5 in bilateral deltoid, bicep, tricep, grip, hip flexor, knee extensors, ankle dorsiflexor and plantar flexor Sensory exam normal sensation to light touch and proprioception in bilateral upper and lower extremities Movements are slow. Needs encouragement and cueing Musculoskeletal: No pain with upper or lower extremity muscle testing.   Assessment/Plan: 1. 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 which require 3+ hours per day of interdisciplinary therapy in a comprehensive inpatient rehab setting. Physiatrist is providing close team supervision and 24 hour management of active medical problems listed below. Physiatrist and rehab team continue to assess barriers to discharge/monitor patient progress toward functional and medical goals. FIM: Function - Bathing Position: Shower Body parts bathed by patient: Right arm, Left arm, Chest, Abdomen, Right upper leg Body parts bathed by helper: Front perineal area, Buttocks, Back Bathing not applicable: Left upper leg, Right lower leg, Left lower leg Assist Level: 2 helpers (Mod assist with washing buttocks while standing in stedy)  Function- Upper Body Dressing/Undressing What is the patient wearing?: Hospital gown, Orthosis Orthosis activity level:  Performed by helper Assist Level: 2 helpers Function - Lower Body Dressing/Undressing What is the patient wearing?: Hospital Gown, Non-skid slipper socks Position: Sitting EOB Pants- Performed by helper: Thread/unthread right pants leg, Thread/unthread left pants leg, Pull pants up/down Non-skid slipper socks- Performed by helper: Don/doff right sock, Don/doff left sock Assist for footwear: Dependant Assist for lower body dressing: 2 Helpers  Function - Toileting Toileting activity did not occur: No continent bowel/bladder event Toileting steps completed by helper: Adjust clothing prior to toileting, Performs perineal hygiene, Adjust clothing after toileting Toileting Assistive Devices: Grab bar or rail Assist level: Two helpers  Function - Air cabin crew transfer assistive device: Bedside commode Assist level to toilet: Moderate assist (Pt 50 - 74%/lift or lower) Assist level from toilet: Moderate assist (Pt 50 - 74%/lift or lower) Assist level to bedside commode (at bedside): 2 helpers (fatigued) Assist level from bedside commode (at bedside): 2 helpers  Function - Chair/bed transfer Chair/bed transfer method: Stand pivot Chair/bed transfer assist level: Maximal assist (Pt 25 - 49%/lift and lower) Chair/bed transfer assistive device: Armrests, Walker Chair/bed transfer details: Manual facilitation for weight bearing, Manual facilitation for placement, Verbal cues for technique, Tactile cues for posture, Verbal cues for sequencing  Function - Locomotion: Wheelchair Will patient use wheelchair at discharge?: Yes Type: Manual Max wheelchair distance: 150 Assist Level: Dependent (Pt equals 0%) Assist Level: Dependent (Pt equals 0%) Assist Level: Dependent (Pt equals 0%) Turns around,maneuvers to table,bed, and toilet,negotiates 3% grade,maneuvers on rugs and over doorsills: No Function - Locomotion: Ambulation Assistive device: Walker-rolling Max distance: 65 Assist  level: 2 helpers (mod assist for pt and RW, +2 required to manage IV pole) Walk 10 feet activity did not occur: Safety/medical concerns Assist level: 2 helpers Walk 50 feet with  2 turns activity did not occur: Safety/medical concerns Assist level: 2 helpers Walk 150 feet activity did not occur: Safety/medical concerns Walk 10 feet on uneven surfaces activity did not occur: Safety/medical concerns  Function - Comprehension Comprehension: Auditory Comprehension assist level: Understands basic 25 - 49% of the time/ requires cueing 50 - 75% of the time  Function - Expression Expression: Verbal Expression assist level: Expresses basic 50 - 74% of the time/requires cueing 25 - 49% of the time. Needs to repeat parts of sentences.  Function - Social Interaction Social Interaction assist level: Interacts appropriately 25 - 49% of time - Needs frequent redirection.  Function - Problem Solving Problem solving assist level: Solves basic 25 - 49% of the time - needs direction more than half the time to initiate, plan or complete simple activities  Function - Memory Memory assist level: Recognizes or recalls less than 25% of the time/requires cueing greater than 75% of the time Patient normally able to recall (first 3 days only): Current season, That he or she is in a hospital  Medical Problem List and Plan: 1. Cognitive deficits, weakness, limitations in self-care secondary to right CVA.Multitrauma is major limiting factor with mobility CIR PT, OT, speech,  Team conference today please see physician documentation under team conference tab, met with team face-to-face to discuss problems,progress, and goals. Formulized individual treatment plan based on medical history, underlying problem and comorbidities.  2. DVT Prophylaxis/Anticoagulation: Pharmaceutical: Heparin, until taking by mouth 3. Pain Management: tylenol prn 4. Mood: Hard to determine at this time due cognitive impairments. LCSW to  follow along for evaluation as appropriate.  5. Neuropsych: This patient is not capable of making decisions on his own behalf. 6. Skin/Wound Care: routine pressure relief measures.  7. Fluids/Electrolytes/Nutrition: Add water flushes to help with hydration.  Still nothing by mouth may need PEG          8. Parkinsonism:On sinemet qid.  9. PAF: Now in NSR and controlled at this time. Monitor HR bid. Continue heparin per Pharm protocol--Eliquis once able to start po intake. Appreciate cardiology consult 10. Acute renal injury: Due to BOO. Continue foley for monitoring I/O. Renal status improving  11. Acute Congestive heart failure: Monitor daily weights--down to 203 lbs. Monitor for signs of overload. 12. UTI: Completed Cipro, rechecked UA +, C&S, negative 13. Hematuria: Continue to monitor output for now. Hemoglobins have been in the 9-10 range. One value of 11, which was spurious Appreciate urology consult 14. Hypernatremia: improving 147 this morning continue water flushes.  15. T12 fracture. Stable, per neurosurgery, may don and doff orthosis while seated on bed, shower with orthosis off 16.  Pre renal azotemia, we'll need to increase H2O flushes, has had problems with keeping feeding tube in place, which is contributing to this issue LOS (Days) 7 A FACE TO FACE EVALUATION WAS PERFORMED  Keevin Panebianco E 06/27/2016, 9:16 AM

## 2016-06-27 NOTE — Progress Notes (Signed)
Occupational Therapy Session Note  Patient Details  Name: Gregory [name] MRN: [id number] Date of Birth: [birth year]  Today's Date: 06/27/2016 OT Individual Time: 1300-1400 OT Individual Time Calculation (min): 60 min    Short Term Goals: Week 1:  OT Short Term Goal 1 (Week 1): Pt will tolerate sitting EOB for 5 mins to engage in grooming tasks OT Short Term Goal 2 (Week 1): Pt will complete bed mobility with max assist of 1 caregiver to engage in functional tasks OT Short Term Goal 3 (Week 1): Pt will engage in bathing at bed level with mod cues and max assist of one caregiver OT Short Term Goal 4 (Week 1): Pt will complete toilet transfers with mod assist of one caregiver   Skilled Therapeutic Interventions/Progress Updates:    Treatment session with focus on activity tolerance, arousal, and ADL retraining.  Pt received in bed with RN finishing up.  Completed bed mobility with total assist due to decreased rolling to Lt and use of chuck pad to scoot hips forward.  Pt in extreme posterior lean this session both in sitting at EOB and standing.  Pt's wife and son present throughout session and had brought clothing.  Pt donned shirt with setup assist to thread BUE, then requiring assist to pull over head due to cortrak tubing.  Therapist donned TLSO seated at EOB.  Donned pants with total assist and then +2 assistance in standing due to posterior lean without UE support.  Utilized bed sheet around pt's hips to facilitate forward weight shift with minimal impact.  Stand pivot transfer bed > w/c with +2 assist due to increased posterior lean throughout session requiring increased multimodal cues.  Engaged in grooming task with setup to comb hair while therapist donned shoes.  Pt beginning to fatigue, therefore attempted ambulation to increase arousal in preparation for SLP session.  Ambulated approx 8-10 feet with 3 musketeers technique to promote upright posture and facilitate stepping pattern with pt  demonstrating decreased stepping with LLE with narrow gait.  3rd person following with w/c and IV pole.   Therapy Documentation Precautions:  Precautions Precautions: Back, Fall Precaution Comments: cortrak tube, fall (parkinson's and recent CVA) Required Braces or Orthoses: Spinal Brace Spinal Brace: Applied in supine position, Thoracolumbosacral orthotic Restrictions Weight Bearing Restrictions: No Pain: Pain Assessment Pain Assessment: Faces Faces Pain Scale: No hurt  See Function Navigator for Current Functional Status.   Therapy/Group: Individual Therapy  Rosalio LoudHOXIE, Gregory [name] 06/27/2016, 3:43 PM

## 2016-06-27 NOTE — Progress Notes (Signed)
Speech Language Pathology Daily Session Notes  Patient Details  Name: Gregory [name] MRN: [id number] Date of Birth: [birth year]  Today's Date: 06/27/2016  Session 1: SLP Individual Time: 1030-1100 SLP Individual Time Calculation (min): 30 min   Session 2: SLP Individual Time: 1400-1455 SLP Individual Time Calculation (min): 55 min  Short Term Goals: Week 1: SLP Short Term Goal 1 (Week 1): Patient will consume trials of ice chips without overt s/s of aspiration to assess readiness for repeat objective swallow assessment.  SLP Short Term Goal 2 (Week 1): Patient will increase speech intelligibility to ~75% at the phrase level with Mod A multimodal cues for use of speech intelligibility strategies.  SLP Short Term Goal 3 (Week 1): Patient will identify 2 physical and 2 cognitive deficits with Mod A mutimodal cues.  SLP Short Term Goal 4 (Week 1): Patient will demonstrate sustained attention to a functional task for 10 minutes with Mod A vebral cues for redirection.  SLP Short Term Goal 5 (Week 1): Patient will demonstrate functional problem solving for functional tasks with Mod A multimodal cues.   Skilled Therapeutic Interventions:  Session 1: Skilled treatment session focused on dysphagia goals. Upon arrival, patient appeared lethargic and required Max A verbal cues to maintain alertness for 30-60 second intervals. Patient consumed minimal trials of ice chips and demonstrated overt coughing in 50% of trials. Patient was also unable to perform the Masako or chin tuck against resistance pharyngeal strengthening exercises despite Max a multimodal cues, suspect due to fatigue. Patient requested to take a nap and was transferred to bed after session. Continue with current plan of care.   Session 2: Skilled treatment session focused on dysphagia and speech goals. Upon arrival, patient was awake while upright in wheelchair with family present and appeared to demonstrate increased alertness. Patient  performed the chin tuck against resistance exercise X 5 with Mod verbal cues but was unable to perform the Masako. Patient consumed ice chips without overt s/s of aspiration and trials of nectar-thick liquids via tsp with delayed cough X 1. Patient utilized multiple swallows and demonstrated effortful swallows in 100% of trials with Mod A verbal cues. Patient also required overall Mod A verbal cues for use of an increased vocal intensity and over-articulation at the phrase and sentence level to achieve ~75% intelligibility. Patient left upright in wheelchair with family present. Continue with current plan of care.   Function:  Eating Eating   Modified Consistency Diet: Yes Eating Assist Level: Helper feeds patient (with trials from SLP )           Cognition Comprehension Comprehension assist level: Understands basic 25 - 49% of the time/ requires cueing 50 - 75% of the time  Expression   Expression assist level: Expresses basic 50 - 74% of the time/requires cueing 25 - 49% of the time. Needs to repeat parts of sentences.  Social Interaction Social Interaction assist level: Interacts appropriately 25 - 49% of time - Needs frequent redirection.  Problem Solving Problem solving assist level: Solves basic 25 - 49% of the time - needs direction more than half the time to initiate, plan or complete simple activities  Memory Memory assist level: Recognizes or recalls less than 25% of the time/requires cueing greater than 75% of the time    Pain Pain Assessment Pain Assessment: Faces Faces Pain Scale: No hurt  Therapy/Group: Individual Therapy  Yacob Wilkerson 06/27/2016, 3:45 PM

## 2016-06-27 NOTE — Progress Notes (Signed)
Hypoglycemic Event  CBG: 68  Treatment: 15 GM carbohydrate snack  Symptoms: Pale and None  Follow-up CBG: Time na CBG Result:na  Possible Reasons for Event: Inadequate meal intake  Comments/MD notified:Pam Love, PA- no new orders hooked back to tube feed    Lurlean Leydeneardon, CarbonadoWhitney J

## 2016-06-28 ENCOUNTER — Inpatient Hospital Stay (HOSPITAL_COMMUNITY): Payer: Medicare Other | Admitting: Physical Therapy

## 2016-06-28 ENCOUNTER — Inpatient Hospital Stay (HOSPITAL_COMMUNITY): Payer: Medicare Other | Admitting: Occupational Therapy

## 2016-06-28 ENCOUNTER — Inpatient Hospital Stay (HOSPITAL_COMMUNITY): Payer: Medicare Other | Admitting: Speech Pathology

## 2016-06-28 LAB — CBC
HCT: 33.3 % — ABNORMAL LOW (ref 39.0–52.0)
HEMOGLOBIN: 9.9 g/dL — AB (ref 13.0–17.0)
MCH: 27.3 pg (ref 26.0–34.0)
MCHC: 29.7 g/dL — AB (ref 30.0–36.0)
MCV: 91.7 fL (ref 78.0–100.0)
Platelets: 335 10*3/uL (ref 150–400)
RBC: 3.63 MIL/uL — ABNORMAL LOW (ref 4.22–5.81)
RDW: 17 % — AB (ref 11.5–15.5)
WBC: 10.4 10*3/uL (ref 4.0–10.5)

## 2016-06-28 LAB — RENAL FUNCTION PANEL
ALBUMIN: 2.6 g/dL — AB (ref 3.5–5.0)
Anion gap: 10 (ref 5–15)
BUN: 25 mg/dL — AB (ref 6–20)
CALCIUM: 9.2 mg/dL (ref 8.9–10.3)
CO2: 27 mmol/L (ref 22–32)
CREATININE: 0.85 mg/dL (ref 0.61–1.24)
Chloride: 108 mmol/L (ref 101–111)
GFR calc Af Amer: >60 mL/min (ref >60)
GLUCOSE: 117 mg/dL — AB (ref 65–99)
PHOSPHORUS: 2.9 mg/dL (ref 2.5–4.6)
POTASSIUM: 4.4 mmol/L (ref 3.5–5.1)
SODIUM: 145 mmol/L (ref 135–145)

## 2016-06-28 LAB — GLUCOSE, CAPILLARY
GLUCOSE-CAPILLARY: 85 mg/dL (ref 65–99)
GLUCOSE-CAPILLARY: 97 mg/dL (ref 65–99)
GLUCOSE-CAPILLARY: 83 mg/dL (ref 65–99)
Glucose-Capillary: 75 mg/dL (ref 65–99)
Glucose-Capillary: 93 mg/dL (ref 65–99)

## 2016-06-28 LAB — HEPARIN LEVEL (UNFRACTIONATED): HEPARIN UNFRACTIONATED: 0.37 [IU]/mL (ref 0.30–0.70)

## 2016-06-28 MED ORDER — NEPRO/CARBSTEADY PO LIQD
1000.0000 mL | ORAL | Status: DC
  Administered 2016-06-29: 1000 mL via ORAL
  Filled 2016-06-28 (×4): qty 1000

## 2016-06-28 MED ORDER — WHITE PETROLATUM GEL
Status: AC
  Filled 2016-06-28: qty 1

## 2016-06-28 NOTE — Progress Notes (Signed)
Physical Therapy Session Note  Patient Details  Name: Gregory [name] MRN: [id number] Date of Birth: Oct 08, 1940  Today's Date: 06/28/2016 PT Individual Time: 1045-1105 PT Individual Time Calculation (min): 20 min   Short Term Goals: Week 1:  PT Short Term Goal 1 (Week 1): Pt will demonstrate bed mobility with mod assist and use of bed features PT Short Term Goal 2 (Week 1): Pt will transfer with +1 assist consistently PT Short Term Goal 3 (Week 1): Pt will ambulate 30' with +2 assist PT Short Term Goal 4 (Week 1): Pt will propel w/c 20' wtih min assist  Skilled Therapeutic Interventions/Progress Updates:    RN in room, pt with no c/o pain.  Pt continues to remain lethargic though interacting more than in previous attempt.  Session focus on maintaining arousal, following commands, and bed mobility.  Pt requiring total assist +1-2 for rolling L and R to changed soiled brief.  Pt noted to be flushed and warm to touch but no fever.  Pt left in care of RN awaiting PA.    Therapy Documentation Precautions:  Precautions Precautions: Back, Fall Precaution Comments: cortrak tube, fall (parkinson's and recent CVA) Required Braces or Orthoses: Spinal Brace Spinal Brace: Applied in supine position, Thoracolumbosacral orthotic Restrictions Weight Bearing Restrictions: No   See Function Navigator for Current Functional Status.   Therapy/Group: Individual Therapy  [name] 06/28/2016, 11:58 AM

## 2016-06-28 NOTE — Progress Notes (Signed)
Physical Therapy Session Note  Patient Details  Name: Gregory [name] MRN: [id number] Date of Birth: Jun 30, 1940  Today's Date: 06/28/2016 PT Individual Time: 1435-1500 PT Individual Time Calculation (min): 25 min   Short Term Goals: Week 1:  PT Short Term Goal 1 (Week 1): Pt will demonstrate bed mobility with mod assist and use of bed features PT Short Term Goal 2 (Week 1): Pt will transfer with +1 assist consistently PT Short Term Goal 3 (Week 1): Pt will ambulate 30' with +2 assist PT Short Term Goal 4 (Week 1): Pt will propel w/c 20' wtih min assist  Skilled Therapeutic Interventions/Progress Updates:    handoff from SLP.  Pt continues to be very lethargic throughout session requiring max cues to maintain arousal.  Session focus on attention, following one step commands, midline orientation, and transfers.  PT provided max multimodal cues for pt to open eyes and attend to therapy session.  Sit<>stand in stedy x2 from w/c with min assist and x2 from elevated stedy seat with supervision.  Pt initially requiring mod/max assist for midline orientation, faded to steady assist as he became more alert.  Transfer with stedy from manual w/c to TIS w/c.  Pt positioned in TIS with QRB in place, call bell in reach and needs met.  Therapy Documentation Precautions:  Precautions Precautions: Back, Fall Precaution Comments: cortrak tube, fall (parkinson's and recent CVA) Required Braces or Orthoses: Spinal Brace Spinal Brace: Applied in supine position, Thoracolumbosacral orthotic Restrictions Weight Bearing Restrictions: No   See Function Navigator for Current Functional Status.   Therapy/Group: Individual Therapy  Earnest Conroy Penven-Crew 06/28/2016, 4:41 PM

## 2016-06-28 NOTE — Progress Notes (Signed)
Physical Therapy Session Note  Patient Details  Name: Emelia LoronRichard Kratzke MRN: [id number] Date of Birth: 1940-12-31  Today's Date: 06/28/2016 PT Individual Time: 1000-1015 PT Individual Time Calculation (min): 15 min   Short Term Goals: Week 1:  PT Short Term Goal 1 (Week 1): Pt will demonstrate bed mobility with mod assist and use of bed features PT Short Term Goal 2 (Week 1): Pt will transfer with +1 assist consistently PT Short Term Goal 3 (Week 1): Pt will ambulate 30' with +2 assist PT Short Term Goal 4 (Week 1): Pt will propel w/c 20' wtih min assist  Skilled Therapeutic Interventions/Progress Updates:    Pt sleeping soundly on arrival.  No indication of pain based on faces scale, session focus on increasing arousal in order to participate in therapy.  No success with elevating HOB up maximally, sternal rub, or cold cloth to face.  Attempted face washing with cool cloth with hand over hand assist, but pt not able to participate due to somnolence.  Will return later as able.    Therapy Documentation Precautions:  Precautions Precautions: Back, Fall Precaution Comments: cortrak tube, fall (parkinson's and recent CVA) Required Braces or Orthoses: Spinal Brace Spinal Brace: Applied in supine position, Thoracolumbosacral orthotic Restrictions Weight Bearing Restrictions: No   See Function Navigator for Current Functional Status.   Therapy/Group: Individual Therapy  Bravlio Luca E Penven-Crew 06/28/2016, 11:56 AM

## 2016-06-28 NOTE — Progress Notes (Signed)
Recreational Therapy Session Note  Patient Details  Name: Gregory [name] MRN: [id number] Date of Birth: 07-13-1940 Today's Date: 06/28/2016  Order received & chart reviewed. TR eval deferred at this time due to low arousal.  Will continue to monitor through team for future participation. Rakia Frayne 06/28/2016, 3:46 PM

## 2016-06-28 NOTE — Plan of Care (Signed)
Problem: RH BOWEL ELIMINATION Goal: RH STG MANAGE BOWEL WITH ASSISTANCE STG Manage Bowel with min Assistance.  Outcome: Progressing Small Bms with the help of suppository. Goal: RH STG MANAGE BOWEL W/MEDICATION W/ASSISTANCE STG Manage Bowel with Medication with min Assistance.  Outcome: Progressing Small Bms with the use of suppository.  Problem: RH SAFETY Goal: RH STG ADHERE TO SAFETY PRECAUTIONS W/ASSISTANCE/DEVICE STG Adhere to Safety Precautions With min Assistance/Device.  Outcome: Progressing Q2H and PRN rounding with the use of restraints.

## 2016-06-28 NOTE — Progress Notes (Signed)
Occupational Therapy Weekly Progress Note  Patient Details  Name: Dayshon Roback MRN: [id number] Date of Birth: [birth year]  Beginning of progress report period: June 21, 2016 End of progress report period: June 28, 2016  Today's Date: 06/28/2016 OT Individual Time: 0830-0920 and 1305-1350 OT Individual Time Calculation (min): 50 min and 45 min Missed 10 mins due to fatigue   Patient has met 3 of 4 short term goals.  Pt is making slow progress towards goals.  Pt currently requires max to total +2 assistance with bed mobility and transfers due to rib fractures and stiffness secondary to Parkinson's.  Pt demonstrates posterior lean with sit > stand and stand pivot transfers requiring +2 assist for safety as well as manual facilitation to increase anterior weight shift.  Pt's participation has been limited secondary to decreased arousal.  Have allowed pt frequent rest breaks to increase arousal in therapy sessions. Mod-total assist for bathing and dressing tasks with max multimodal cues for sequencing and arousal.    Patient continues to demonstrate the following deficits: muscle weakness, decreased cardiorespiratoy endurance, motor apraxia, ataxia and decreased motor planning, decreased visual perceptual skills, decreased attention to left and ideational apraxia, decreased attention, decreased awareness, decreased problem solving, decreased memory and delayed processing and decreased sitting balance, decreased standing balance, decreased postural control and difficulty maintaining precautions and therefore will continue to benefit from skilled OT intervention to enhance overall performance with BADL and Reduce care partner burden.  Patient progressing toward long term goals..  Continue plan of care.  OT Short Term Goals Week 1:  OT Short Term Goal 1 (Week 1): Pt will tolerate sitting EOB for 5 mins to engage in grooming tasks OT Short Term Goal 1 - Progress (Week 1): Met OT Short Term Goal  2 (Week 1): Pt will complete bed mobility with max assist of 1 caregiver to engage in functional tasks OT Short Term Goal 2 - Progress (Week 1): Met OT Short Term Goal 3 (Week 1): Pt will engage in bathing at bed level with mod cues and max assist of one caregiver OT Short Term Goal 3 - Progress (Week 1): Updated due to goal met OT Short Term Goal 4 (Week 1): Pt will complete toilet transfers with mod assist of one caregiver  OT Short Term Goal 4 - Progress (Week 1): Progressing toward goal Week 2:  OT Short Term Goal 1 (Week 2): Pt will complete toilet transfers with mod assist of one caregiver  OT Short Term Goal 2 (Week 2): Pt will complete bathing at sit > stand level with mod assist of one caregiver OT Short Term Goal 3 (Week 2): Pt will complete LB dressing with max assist of one caregiver OT Short Term Goal 4 (Week 2): Pt will attend to task in standing for 5 mins to increase arousal and participation  Skilled Therapeutic Interventions/Progress Updates:    1) Treatment session with focus on arousal, following directions, and sequencing during LB hygiene and dressing at bed level.  Pt asleep upon arrival with limited engagement in therapy initially.  Turned on lights and attempted wash cloth for increased arousal.  Noted pt to be incontinent of bowel, soiling bed pad.  Engaged in rolling at bed level with +2 assist due to decreased alertness and difficulty following directions secondary to fatigue.  Pt would interact with therapist intermittently throughout session, even participating in conversation between therapist and rehab tech, but kept eyes closed throughout session.  Total assist +2 for mobility, hygiene, and LB  dressing due to decreased arousal.  Scooted pt up in bed with maxi slide for increased mobility and left upright in bed with lights on and tv on to increase alertness.  2) Treatment session with focus on arousal, activity tolerance, and following directions.  Pt received in bed  with some family members present.  Required increased time to arouse, however more alert this session with family present.  Completed bed mobility with max-total assist of one caregiver to come up to EOB.  Donned shoes while seated EOB with cues for upright sitting balance, requiring intermittent min-mod assist due to fatigue.  Sit > stand with mod-max assist +2 for anterior weight shift and lifting up to standing.  Completed stand pivot transfer to Rt with mod-max assist +2 due to posterior lean.  Engaged in grooming in sitting at sink to promote increased alertness with visual feedback for orientation and attention to task.  Pt left upright in w/c with quick release belt donned and family and visitors present.  Therapy Documentation Precautions:  Precautions Precautions: Back, Fall Precaution Comments: cortrak tube, fall (parkinson's and recent CVA) Required Braces or Orthoses: Spinal Brace Spinal Brace: Applied in supine position, Thoracolumbosacral orthotic Restrictions Weight Bearing Restrictions: No General:   Vital Signs: Therapy Vitals Temp: 98.1 F (36.7 C) Temp Source: Oral Pulse Rate: 73 Resp: 17 BP: 129/65 Patient Position (if appropriate): Lying Oxygen Therapy SpO2: 99 % O2 Device: Not Delivered Pain:   Pt with grimacing with rolling, pain not rated  See Function Navigator for Current Functional Status.   Therapy/Group: Individual Therapy  [name] 06/28/2016, 7:29 AM

## 2016-06-28 NOTE — Progress Notes (Signed)
ANTICOAGULATION CONSULT NOTE - Follow Up Consult  Pharmacy Consult for heparin Indication: CVA /afib  No Known Allergies  Patient Measurements: Height: 5\' 11"  (180.3 cm) Weight: 202 lb 2.6 oz (91.7 kg) IBW/kg (Calculated) : 75.3  Vital Signs: Temp: 98.1 F (36.7 C) (02/08 0539) Temp Source: Oral (02/08 0539) BP: 129/65 (02/08 0539) Pulse Rate: 73 (02/08 0539)  Labs:  Recent Labs  06/26/16 0418 06/27/16 0832 06/28/16 0456  HGB 9.8* 10.3* 9.9*  HCT 32.7* 34.0* 33.3*  PLT 377 383 335  HEPARINUNFRC 0.50 0.34 0.37  CREATININE 1.04 0.85 0.85    Estimated Creatinine Clearance: 85.6 mL/min (by C-G formula based on SCr of 0.85 mg/dL).  Infusions:  . feeding supplement (NEPRO CARB STEADY) 1,000 mL (06/28/16 0412)  . 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 Heparin level 0.37 on 1450 units/hr this morning, hgb 9.9, pltc 335 K, stable. Will switch to oral anticoagulation if pt can swallow.   Goal of Therapy:  Heparin level 0.3-0.5 units/ml Monitor platelets by anticoagulation protocol: Yes   Plan:  - Continue heparin gtt 1450 units/hr - Daily heparin level and CBC  - f/u transitioning to oral anticoag when taking PO   Bayard HuggerMei Nikeya Maxim, PharmD, BCPS  Clinical Pharmacist  Pager: [phone]  06/28/16 9:08 AM
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
## 2016-06-28 NOTE — Progress Notes (Signed)
Speech Language Pathology Weekly Progress and Session Note  Patient Details  Name: Gregory [name] MRN: [id number] Date of Birth: [birth year]  Beginning of progress report period: June 21, 2016 End of progress report period: June 28, 2016  Today's Date: 06/28/2016 SLP Individual Time: 1400-1430 SLP Individual Time Calculation (min): 30 min  Short Term Goals: Week 1: SLP Short Term Goal 1 (Week 1): Patient will consume trials of ice chips without overt s/s of aspiration to assess readiness for repeat objective swallow assessment.  SLP Short Term Goal 1 - Progress (Week 1): Met SLP Short Term Goal 2 (Week 1): Patient will increase speech intelligibility to ~75% at the phrase level with Mod A multimodal cues for use of speech intelligibility strategies.  SLP Short Term Goal 2 - Progress (Week 1): Met SLP Short Term Goal 3 (Week 1): Patient will identify 2 physical and 2 cognitive deficits with Mod A mutimodal cues.  SLP Short Term Goal 3 - Progress (Week 1): Not met SLP Short Term Goal 4 (Week 1): Patient will demonstrate sustained attention to a functional task for 10 minutes with Mod A vebral cues for redirection.  SLP Short Term Goal 4 - Progress (Week 1): Not met SLP Short Term Goal 5 (Week 1): Patient will demonstrate functional problem solving for functional tasks with Mod A multimodal cues.  SLP Short Term Goal 5 - Progress (Week 1): Not met    New Short Term Goals: Week 2: SLP Short Term Goal 1 (Week 2): Patient will consume trials of nectar-thick liquids via tsp with minimal overt s/s of aspiration to assess readiness for repeat objective swallow assessment.  SLP Short Term Goal 2 (Week 2): Patient will increase speech intelligibility to ~75% at the phrase level with Min A multimodal cues for use of speech intelligibility strategies.  SLP Short Term Goal 3 (Week 2): Patient will identify 2 physical and 2 cognitive deficits with Mod A mutimodal cues.  SLP Short Term Goal 4  (Week 2): Patient will demonstrate sustained attention to a functional task for 5 minutes with Mod A vebral cues for redirection.  SLP Short Term Goal 5 (Week 2): Patient will demonstrate functional problem solving for functional tasks with Mod A multimodal cues.   Weekly Progress Updates: Patient has made functional gains and has met 2 of 5 STG's this reporting period. Currently, patient remains NPO. Patient had MBS this week which showed silent penetration with all consistencies due to pharyngeal residue which was exacerbated by fatigue and lethargy. However, suspect prognosis for PO is good as patient's mentation improves. SLP has been administering trials of ice chips and nectar-thick liquids via tsp with focus on hard and multiple swallows. Patient has been consuming trials with minimal overt s/s of aspiration. Hopeful for repeat MBS next week. Patient continues to demonstrate fatigue and lethargy with inconsistent cognitive functioning. Overall, patient is Max A to complete functional and familiar tasks safely in regards to problem solving, recall and awareness. Patient also demonstrates increased speech intelligibility at the phrase and sentence level with Mod A verbal cues to utilize an increased vocal intensity and maximize over-articulation. Patient and family education is ongoing. Patient would benefit from continued skilled SLP intervention to maximize his cognitive and swallowing function and overall functional independence prior to discharge.       Intensity: Minumum of 1-2 x/day, 30 to 90 minutes Frequency: 3 to 5 out of 7 days Duration/Length of Stay: 2/28 Treatment/Interventions: Cognitive remediation/compensation;Cueing hierarchy;Dysphagia/aspiration precaution training;Environmental controls;Therapeutic Activities;Internal/external aids;Speech/Language facilitation;Functional tasks;Patient/family  education   Daily Session  Skilled Therapeutic Interventions: Skilled treatment session  focused on dysphagia and cognitive goals. Upon arrival, patient was upright in the wheelchair but appeared lethargic. Patient required Mod-Max A verbal and tactile cues to maintain alertness for ~1-2 minute intervals throughout the session. Due to fatigue with increased coughing and wet vocal quality, trials were not administered today. However, patient performed the chin tuck against resistance exercise with Max A verbal and demonstration cues X 10. Patient with intermittent language of confusion with grasping in the air for items that were not present, however, patient independently oriented to place and situation. Patient left upright in wheelchair with family present. Continue with current plan of care.      Function:    Cognition Comprehension Comprehension assist level: Understands basic 25 - 49% of the time/ requires cueing 50 - 75% of the time  Expression   Expression assist level: Expresses basic 50 - 74% of the time/requires cueing 25 - 49% of the time. Needs to repeat parts of sentences.  Social Interaction Social Interaction assist level: Interacts appropriately 25 - 49% of time - Needs frequent redirection.  Problem Solving Problem solving assist level: Solves basic 25 - 49% of the time - needs direction more than half the time to initiate, plan or complete simple activities  Memory Memory assist level: Recognizes or recalls less than 25% of the time/requires cueing greater than 75% of the time   Pain No/Denies Pain   Therapy/Group: Individual Therapy  [name] 06/28/2016, 3:31 PM
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
## 2016-06-28 NOTE — Progress Notes (Signed)
Spoke with nurse, persistent hematuria, "strawberry lemonade" color.  Very small bits of clot passing.  Would continue to be patient at this point and allow bleeding to stop on its own.
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
## 2016-06-29 ENCOUNTER — Inpatient Hospital Stay (HOSPITAL_COMMUNITY): Payer: Medicare Other | Admitting: Physical Therapy

## 2016-06-29 ENCOUNTER — Inpatient Hospital Stay (HOSPITAL_COMMUNITY)
Admission: AD | Admit: 2016-06-29 | Discharge: 2016-07-19 | DRG: 004 | Disposition: A | Discharge status: Rehabilitation Facility | Payer: Medicare Other | Source: Ambulatory Visit | Attending: Internal Medicine | Admitting: Internal Medicine

## 2016-06-29 ENCOUNTER — Inpatient Hospital Stay (HOSPITAL_COMMUNITY): Payer: Medicare Other

## 2016-06-29 ENCOUNTER — Inpatient Hospital Stay (HOSPITAL_COMMUNITY): Payer: Medicare Other | Admitting: Occupational Therapy

## 2016-06-29 ENCOUNTER — Inpatient Hospital Stay (HOSPITAL_COMMUNITY): Payer: Medicare Other | Admitting: Speech Pathology

## 2016-06-29 DIAGNOSIS — J398 Other specified diseases of upper respiratory tract: Secondary | ICD-10-CM | POA: Diagnosis not present

## 2016-06-29 DIAGNOSIS — E0781 Sick-euthyroid syndrome: Secondary | ICD-10-CM | POA: Diagnosis present

## 2016-06-29 DIAGNOSIS — N401 Enlarged prostate with lower urinary tract symptoms: Secondary | ICD-10-CM | POA: Diagnosis not present

## 2016-06-29 DIAGNOSIS — A419 Sepsis, unspecified organism: Principal | ICD-10-CM | POA: Diagnosis present

## 2016-06-29 DIAGNOSIS — Y95 Nosocomial condition: Secondary | ICD-10-CM | POA: Diagnosis present

## 2016-06-29 DIAGNOSIS — I69398 Other sequelae of cerebral infarction: Secondary | ICD-10-CM | POA: Diagnosis not present

## 2016-06-29 DIAGNOSIS — Z951 Presence of aortocoronary bypass graft: Secondary | ICD-10-CM | POA: Diagnosis not present

## 2016-06-29 DIAGNOSIS — J9690 Respiratory failure, unspecified, unspecified whether with hypoxia or hypercapnia: Secondary | ICD-10-CM | POA: Diagnosis not present

## 2016-06-29 DIAGNOSIS — J9620 Acute and chronic respiratory failure, unspecified whether with hypoxia or hypercapnia: Secondary | ICD-10-CM | POA: Diagnosis not present

## 2016-06-29 DIAGNOSIS — J9621 Acute and chronic respiratory failure with hypoxia: Secondary | ICD-10-CM | POA: Diagnosis present

## 2016-06-29 DIAGNOSIS — E785 Hyperlipidemia, unspecified: Secondary | ICD-10-CM | POA: Diagnosis present

## 2016-06-29 DIAGNOSIS — N189 Chronic kidney disease, unspecified: Secondary | ICD-10-CM | POA: Diagnosis present

## 2016-06-29 DIAGNOSIS — I5033 Acute on chronic diastolic (congestive) heart failure: Secondary | ICD-10-CM | POA: Diagnosis present

## 2016-06-29 DIAGNOSIS — I639 Cerebral infarction, unspecified: Secondary | ICD-10-CM | POA: Diagnosis not present

## 2016-06-29 DIAGNOSIS — Z8673 Personal history of transient ischemic attack (TIA), and cerebral infarction without residual deficits: Secondary | ICD-10-CM

## 2016-06-29 DIAGNOSIS — D62 Acute posthemorrhagic anemia: Secondary | ICD-10-CM | POA: Diagnosis present

## 2016-06-29 DIAGNOSIS — I2510 Atherosclerotic heart disease of native coronary artery without angina pectoris: Secondary | ICD-10-CM | POA: Diagnosis present

## 2016-06-29 DIAGNOSIS — J9501 Hemorrhage from tracheostomy stoma: Secondary | ICD-10-CM | POA: Diagnosis present

## 2016-06-29 DIAGNOSIS — K760 Fatty (change of) liver, not elsewhere classified: Secondary | ICD-10-CM | POA: Diagnosis present

## 2016-06-29 DIAGNOSIS — R509 Fever, unspecified: Secondary | ICD-10-CM | POA: Diagnosis present

## 2016-06-29 DIAGNOSIS — I130 Hypertensive heart and chronic kidney disease with heart failure and stage 1 through stage 4 chronic kidney disease, or unspecified chronic kidney disease: Secondary | ICD-10-CM | POA: Diagnosis present

## 2016-06-29 DIAGNOSIS — Z9842 Cataract extraction status, left eye: Secondary | ICD-10-CM

## 2016-06-29 DIAGNOSIS — N179 Acute kidney failure, unspecified: Secondary | ICD-10-CM | POA: Diagnosis present

## 2016-06-29 DIAGNOSIS — E869 Volume depletion, unspecified: Secondary | ICD-10-CM | POA: Diagnosis present

## 2016-06-29 DIAGNOSIS — I6521 Occlusion and stenosis of right carotid artery: Secondary | ICD-10-CM | POA: Diagnosis present

## 2016-06-29 DIAGNOSIS — J40 Bronchitis, not specified as acute or chronic: Secondary | ICD-10-CM | POA: Diagnosis present

## 2016-06-29 DIAGNOSIS — Z978 Presence of other specified devices: Secondary | ICD-10-CM

## 2016-06-29 DIAGNOSIS — H9201 Otalgia, right ear: Secondary | ICD-10-CM | POA: Diagnosis not present

## 2016-06-29 DIAGNOSIS — I6300 Cerebral infarction due to thrombosis of unspecified precerebral artery: Secondary | ICD-10-CM | POA: Diagnosis not present

## 2016-06-29 DIAGNOSIS — S22089S Unspecified fracture of T11-T12 vertebra, sequela: Secondary | ICD-10-CM | POA: Diagnosis not present

## 2016-06-29 DIAGNOSIS — I69319 Unspecified symptoms and signs involving cognitive functions following cerebral infarction: Secondary | ICD-10-CM | POA: Diagnosis not present

## 2016-06-29 DIAGNOSIS — J9610 Chronic respiratory failure, unspecified whether with hypoxia or hypercapnia: Secondary | ICD-10-CM | POA: Diagnosis not present

## 2016-06-29 DIAGNOSIS — D72829 Elevated white blood cell count, unspecified: Secondary | ICD-10-CM

## 2016-06-29 DIAGNOSIS — R1310 Dysphagia, unspecified: Secondary | ICD-10-CM | POA: Diagnosis present

## 2016-06-29 DIAGNOSIS — R338 Other retention of urine: Secondary | ICD-10-CM | POA: Diagnosis not present

## 2016-06-29 DIAGNOSIS — G9341 Metabolic encephalopathy: Secondary | ICD-10-CM | POA: Diagnosis present

## 2016-06-29 DIAGNOSIS — I63231 Cerebral infarction due to unspecified occlusion or stenosis of right carotid arteries: Secondary | ICD-10-CM | POA: Diagnosis not present

## 2016-06-29 DIAGNOSIS — E43 Unspecified severe protein-calorie malnutrition: Secondary | ICD-10-CM | POA: Diagnosis present

## 2016-06-29 DIAGNOSIS — I5031 Acute diastolic (congestive) heart failure: Secondary | ICD-10-CM | POA: Diagnosis not present

## 2016-06-29 DIAGNOSIS — N139 Obstructive and reflux uropathy, unspecified: Secondary | ICD-10-CM | POA: Diagnosis present

## 2016-06-29 DIAGNOSIS — I63131 Cerebral infarction due to embolism of right carotid artery: Secondary | ICD-10-CM | POA: Diagnosis not present

## 2016-06-29 DIAGNOSIS — J15211 Pneumonia due to Methicillin susceptible Staphylococcus aureus: Secondary | ICD-10-CM | POA: Diagnosis present

## 2016-06-29 DIAGNOSIS — I480 Paroxysmal atrial fibrillation: Secondary | ICD-10-CM | POA: Diagnosis present

## 2016-06-29 DIAGNOSIS — J9601 Acute respiratory failure with hypoxia: Secondary | ICD-10-CM | POA: Diagnosis not present

## 2016-06-29 DIAGNOSIS — G9340 Encephalopathy, unspecified: Secondary | ICD-10-CM | POA: Diagnosis not present

## 2016-06-29 DIAGNOSIS — G20 Parkinson's disease: Secondary | ICD-10-CM | POA: Diagnosis present

## 2016-06-29 DIAGNOSIS — I4891 Unspecified atrial fibrillation: Secondary | ICD-10-CM | POA: Diagnosis not present

## 2016-06-29 DIAGNOSIS — I6310 Cerebral infarction due to embolism of unspecified precerebral artery: Secondary | ICD-10-CM | POA: Diagnosis not present

## 2016-06-29 DIAGNOSIS — J9600 Acute respiratory failure, unspecified whether with hypoxia or hypercapnia: Secondary | ICD-10-CM | POA: Diagnosis not present

## 2016-06-29 DIAGNOSIS — R6521 Severe sepsis with septic shock: Secondary | ICD-10-CM | POA: Diagnosis present

## 2016-06-29 DIAGNOSIS — R05 Cough: Secondary | ICD-10-CM

## 2016-06-29 DIAGNOSIS — K219 Gastro-esophageal reflux disease without esophagitis: Secondary | ICD-10-CM | POA: Diagnosis present

## 2016-06-29 DIAGNOSIS — Z87891 Personal history of nicotine dependence: Secondary | ICD-10-CM

## 2016-06-29 DIAGNOSIS — I714 Abdominal aortic aneurysm, without rupture: Secondary | ICD-10-CM | POA: Diagnosis present

## 2016-06-29 DIAGNOSIS — J690 Pneumonitis due to inhalation of food and vomit: Secondary | ICD-10-CM | POA: Diagnosis present

## 2016-06-29 DIAGNOSIS — K920 Hematemesis: Secondary | ICD-10-CM

## 2016-06-29 DIAGNOSIS — R451 Restlessness and agitation: Secondary | ICD-10-CM | POA: Diagnosis not present

## 2016-06-29 DIAGNOSIS — I69391 Dysphagia following cerebral infarction: Secondary | ICD-10-CM | POA: Diagnosis not present

## 2016-06-29 DIAGNOSIS — Z6828 Body mass index (BMI) 28.0-28.9, adult: Secondary | ICD-10-CM

## 2016-06-29 DIAGNOSIS — J189 Pneumonia, unspecified organism: Secondary | ICD-10-CM | POA: Diagnosis not present

## 2016-06-29 DIAGNOSIS — K5900 Constipation, unspecified: Secondary | ICD-10-CM | POA: Diagnosis not present

## 2016-06-29 DIAGNOSIS — Z930 Tracheostomy status: Secondary | ICD-10-CM | POA: Diagnosis not present

## 2016-06-29 DIAGNOSIS — R042 Hemoptysis: Secondary | ICD-10-CM | POA: Diagnosis not present

## 2016-06-29 DIAGNOSIS — E873 Alkalosis: Secondary | ICD-10-CM | POA: Diagnosis present

## 2016-06-29 DIAGNOSIS — E876 Hypokalemia: Secondary | ICD-10-CM | POA: Diagnosis present

## 2016-06-29 DIAGNOSIS — R0902 Hypoxemia: Secondary | ICD-10-CM | POA: Diagnosis not present

## 2016-06-29 DIAGNOSIS — R040 Epistaxis: Secondary | ICD-10-CM | POA: Diagnosis not present

## 2016-06-29 DIAGNOSIS — R310 Gross hematuria: Secondary | ICD-10-CM | POA: Diagnosis not present

## 2016-06-29 DIAGNOSIS — J810 Acute pulmonary edema: Secondary | ICD-10-CM | POA: Diagnosis not present

## 2016-06-29 DIAGNOSIS — Z79899 Other long term (current) drug therapy: Secondary | ICD-10-CM

## 2016-06-29 DIAGNOSIS — R269 Unspecified abnormalities of gait and mobility: Secondary | ICD-10-CM | POA: Diagnosis not present

## 2016-06-29 DIAGNOSIS — J811 Chronic pulmonary edema: Secondary | ICD-10-CM

## 2016-06-29 LAB — URINALYSIS, MICROSCOPIC (REFLEX)

## 2016-06-29 LAB — CBC
HCT: 33.5 % — ABNORMAL LOW (ref 39.0–52.0)
HCT: 31.9 % — ABNORMAL LOW (ref 39.0–52.0)
HEMOGLOBIN: 10.4 g/dL — AB (ref 13.0–17.0)
HEMOGLOBIN: 9.9 g/dL — AB (ref 13.0–17.0)
MCH: 28.1 pg (ref 26.0–34.0)
MCH: 27.7 pg (ref 26.0–34.0)
MCHC: 31 g/dL (ref 30.0–36.0)
MCHC: 31 g/dL (ref 30.0–36.0)
MCV: 90.5 fL (ref 78.0–100.0)
MCV: 89.1 fL (ref 78.0–100.0)
Platelets: 228 10*3/uL (ref 150–400)
Platelets: 308 10*3/uL (ref 150–400)
RBC: 3.7 MIL/uL — ABNORMAL LOW (ref 4.22–5.81)
RBC: 3.58 MIL/uL — ABNORMAL LOW (ref 4.22–5.81)
RDW: 17.3 % — AB (ref 11.5–15.5)
RDW: 17.2 % — AB (ref 11.5–15.5)
WBC: 15.1 10*3/uL — ABNORMAL HIGH (ref 4.0–10.5)
WBC: 19.7 10*3/uL — AB (ref 4.0–10.5)

## 2016-06-29 LAB — URINALYSIS, ROUTINE W REFLEX MICROSCOPIC
GLUCOSE, UA: NEGATIVE mg/dL
Ketones, ur: 15 mg/dL — AB
Nitrite: NEGATIVE
PROTEIN: 30 mg/dL — AB
SPECIFIC GRAVITY, URINE: 1.02 (ref 1.005–1.030)
pH: 5.5 (ref 5.0–8.0)

## 2016-06-29 LAB — BLOOD GAS, ARTERIAL
Acid-base deficit: 1.1 mmol/L (ref 0.0–2.0)
Bicarbonate: 23.1 mmol/L (ref 20.0–28.0)
DRAWN BY: 44898
FIO2: 40
MECHVT: 600 mL
O2 SAT: 97.6 %
PCO2 ART: 38.4 mmHg (ref 32.0–48.0)
PEEP: 5 cmH2O
PH ART: 7.397 (ref 7.350–7.450)
PO2 ART: 95.8 mmHg (ref 83.0–108.0)
Patient temperature: 98.7
RATE: 22 resp/min

## 2016-06-29 LAB — RENAL FUNCTION PANEL
ALBUMIN: 2.6 g/dL — AB (ref 3.5–5.0)
ANION GAP: 14 (ref 5–15)
BUN: 25 mg/dL — ABNORMAL HIGH (ref 6–20)
CHLORIDE: 104 mmol/L (ref 101–111)
CO2: 21 mmol/L — AB (ref 22–32)
Calcium: 8.9 mg/dL (ref 8.9–10.3)
Creatinine, Ser: 1.04 mg/dL (ref 0.61–1.24)
GFR calc Af Amer: >60 mL/min (ref >60)
GFR calc non Af Amer: >60 mL/min (ref >60)
GLUCOSE: 121 mg/dL — AB (ref 65–99)
PHOSPHORUS: 2.3 mg/dL — AB (ref 2.5–4.6)
POTASSIUM: 4 mmol/L (ref 3.5–5.1)
Sodium: 139 mmol/L (ref 135–145)

## 2016-06-29 LAB — MRSA PCR SCREENING: MRSA BY PCR: POSITIVE — AB

## 2016-06-29 LAB — BASIC METABOLIC PANEL
Anion gap: 11 (ref 5–15)
BUN: 26 mg/dL — ABNORMAL HIGH (ref 6–20)
CALCIUM: 8.8 mg/dL — AB (ref 8.9–10.3)
CHLORIDE: 105 mmol/L (ref 101–111)
CO2: 22 mmol/L (ref 22–32)
Creatinine, Ser: 1.1 mg/dL (ref 0.61–1.24)
GFR calc Af Amer: >60 mL/min (ref >60)
GFR calc non Af Amer: >60 mL/min (ref >60)
GLUCOSE: 122 mg/dL — AB (ref 65–99)
Potassium: 3.5 mmol/L (ref 3.5–5.1)
Sodium: 138 mmol/L (ref 135–145)

## 2016-06-29 LAB — INFLUENZA PANEL BY PCR (TYPE A & B)
Influenza A By PCR: NEGATIVE
Influenza B By PCR: NEGATIVE

## 2016-06-29 LAB — PLATELET FUNCTION ASSAY: COLLAGEN / EPINEPHRINE: 157 s (ref 0–193)

## 2016-06-29 LAB — LACTIC ACID, PLASMA
LACTIC ACID, VENOUS: 1.1 mmol/L (ref 0.5–1.9)
LACTIC ACID, VENOUS: 0.9 mmol/L (ref 0.5–1.9)

## 2016-06-29 LAB — MAGNESIUM: MAGNESIUM: 1.7 mg/dL (ref 1.7–2.4)

## 2016-06-29 LAB — GLUCOSE, CAPILLARY
GLUCOSE-CAPILLARY: 123 mg/dL — AB (ref 65–99)
GLUCOSE-CAPILLARY: 137 mg/dL — AB (ref 65–99)
Glucose-Capillary: 104 mg/dL — ABNORMAL HIGH (ref 65–99)
Glucose-Capillary: 92 mg/dL (ref 65–99)

## 2016-06-29 LAB — PROCALCITONIN: Procalcitonin: <0.1 ng/mL

## 2016-06-29 LAB — HEPARIN LEVEL (UNFRACTIONATED)
HEPARIN UNFRACTIONATED: 0.29 [IU]/mL — AB (ref 0.30–0.70)
Heparin Unfractionated: <0.1 IU/mL — ABNORMAL LOW (ref 0.30–0.70)

## 2016-06-29 LAB — PHOSPHORUS: Phosphorus: 2.4 mg/dL — ABNORMAL LOW (ref 2.5–4.6)

## 2016-06-29 LAB — APTT: aPTT: 96 seconds — ABNORMAL HIGH (ref 24–36)

## 2016-06-29 MED ORDER — FENTANYL CITRATE (PF) 100 MCG/2ML IJ SOLN
50.0000 ug | Freq: Once | INTRAMUSCULAR | Status: AC
Start: 2016-06-29 — End: 2016-06-29
  Administered 2016-06-29: 50 ug via INTRAVENOUS
  Filled 2016-06-29: qty 2

## 2016-06-29 MED ORDER — VITAL HIGH PROTEIN PO LIQD: 1000.0000 mL | ORAL | Status: DC

## 2016-06-29 MED ORDER — SODIUM CHLORIDE 0.9 % IV SOLN
0.0000 ug/min | INTRAVENOUS | Status: DC
  Administered 2016-06-29: 50 ug/min via INTRAVENOUS
  Administered 2016-06-29: 40 ug/min via INTRAVENOUS
  Administered 2016-06-30: 50 ug/min via INTRAVENOUS
  Filled 2016-06-29 (×3): qty 1

## 2016-06-29 MED ORDER — MIDAZOLAM HCL 2 MG/2ML IJ SOLN
INTRAMUSCULAR | Status: AC
  Filled 2016-06-29: qty 2

## 2016-06-29 MED ORDER — VANCOMYCIN HCL IN DEXTROSE 1-5 GM/200ML-% IV SOLN
1000.0000 mg | Freq: Two times a day (BID) | INTRAVENOUS | Status: DC
  Filled 2016-06-29: qty 200

## 2016-06-29 MED ORDER — VITAL AF 1.2 CAL PO LIQD
1000.0000 mL | ORAL | Status: DC
  Administered 2016-06-29 – 2016-07-18 (×24): 1000 mL
  Filled 2016-06-29 (×13): qty 1000

## 2016-06-29 MED ORDER — TAMSULOSIN HCL 0.4 MG PO CAPS
0.4000 mg | ORAL_CAPSULE | Freq: Every day | ORAL | Status: DC
  Filled 2016-06-29: qty 1

## 2016-06-29 MED ORDER — SODIUM CHLORIDE 0.9 % IV SOLN
INTRAVENOUS | Status: DC
  Administered 2016-06-29 – 2016-07-05 (×4): via INTRAVENOUS
  Administered 2016-07-09: 10 mL/h via INTRAVENOUS
  Administered 2016-07-14 – 2016-07-15 (×2): via INTRAVENOUS

## 2016-06-29 MED ORDER — PANTOPRAZOLE SODIUM 40 MG PO PACK
40.0000 mg | PACK | Freq: Two times a day (BID) | ORAL | Status: DC
  Administered 2016-06-29 – 2016-07-13 (×29): 40 mg
  Filled 2016-06-29 (×29): qty 20

## 2016-06-29 MED ORDER — FUROSEMIDE 10 MG/ML IJ SOLN
40.0000 mg | Freq: Once | INTRAMUSCULAR | Status: AC
  Administered 2016-06-29: 40 mg via INTRAVENOUS

## 2016-06-29 MED ORDER — FENTANYL CITRATE (PF) 100 MCG/2ML IJ SOLN
50.0000 ug | INTRAMUSCULAR | Status: DC | PRN
  Filled 2016-06-29: qty 2

## 2016-06-29 MED ORDER — MIDAZOLAM HCL 2 MG/2ML IJ SOLN
1.0000 mg | Freq: Once | INTRAMUSCULAR | Status: AC
  Administered 2016-06-29: 1 mg via INTRAVENOUS

## 2016-06-29 MED ORDER — SODIUM PHOSPHATES 45 MMOLE/15ML IV SOLN
20.0000 mmol | Freq: Once | INTRAVENOUS | Status: AC
  Administered 2016-06-29: 20 mmol via INTRAVENOUS
  Filled 2016-06-29: qty 6.67

## 2016-06-29 MED ORDER — MIDAZOLAM HCL 2 MG/2ML IJ SOLN
1.0000 mg | INTRAMUSCULAR | Status: DC | PRN
  Administered 2016-06-29 – 2016-07-03 (×5): 1 mg via INTRAVENOUS
  Filled 2016-06-29 (×5): qty 2

## 2016-06-29 MED ORDER — PIPERACILLIN-TAZOBACTAM 3.375 G IVPB
3.3750 g | Freq: Three times a day (TID) | INTRAVENOUS | Status: DC
  Administered 2016-06-29 – 2016-07-02 (×9): 3.375 g via INTRAVENOUS
  Filled 2016-06-29 (×12): qty 50

## 2016-06-29 MED ORDER — CARBIDOPA-LEVODOPA 25-100 MG PO TABS
1.5000 | ORAL_TABLET | Freq: Four times a day (QID) | ORAL | Status: DC
  Administered 2016-06-29 – 2016-07-11 (×49): 1.5
  Filled 2016-06-29 (×51): qty 1.5

## 2016-06-29 MED ORDER — PIPERACILLIN-TAZOBACTAM 3.375 G IVPB
3.3750 g | Freq: Three times a day (TID) | INTRAVENOUS | Status: DC
  Administered 2016-06-29: 3.375 g via INTRAVENOUS
  Filled 2016-06-29: qty 50

## 2016-06-29 MED ORDER — CHLORHEXIDINE GLUCONATE CLOTH 2 % EX PADS
6.0000 | MEDICATED_PAD | Freq: Every day | CUTANEOUS | Status: DC
  Administered 2016-06-30 – 2016-07-01 (×2): 6 via TOPICAL

## 2016-06-29 MED ORDER — ACETAMINOPHEN 325 MG PO TABS: 650.0000 mg | ORAL_TABLET | Freq: Once | ORAL | Status: AC

## 2016-06-29 MED ORDER — PANTOPRAZOLE SODIUM 40 MG PO PACK
40.0000 mg | PACK | ORAL | Status: DC
  Filled 2016-06-29: qty 20

## 2016-06-29 MED ORDER — SODIUM CHLORIDE 0.9 % IV SOLN
1500.0000 mg | Freq: Once | INTRAVENOUS | Status: AC
  Administered 2016-06-29: 1500 mg via INTRAVENOUS
  Filled 2016-06-29: qty 1500

## 2016-06-29 MED ORDER — FENTANYL BOLUS VIA INFUSION
25.0000 ug | INTRAVENOUS | Status: DC | PRN
  Administered 2016-07-01 – 2016-07-05 (×4): 25 ug via INTRAVENOUS
  Filled 2016-06-29: qty 25

## 2016-06-29 MED ORDER — CARBIDOPA-LEVODOPA ER 50-200 MG PO TBCR
1.0000 | EXTENDED_RELEASE_TABLET | ORAL | Status: DC
  Filled 2016-06-29: qty 1

## 2016-06-29 MED ORDER — PRO-STAT SUGAR FREE PO LIQD
30.0000 mL | Freq: Two times a day (BID) | ORAL | Status: DC
  Filled 2016-06-29: qty 30

## 2016-06-29 MED ORDER — FENTANYL CITRATE (PF) 100 MCG/2ML IJ SOLN
INTRAMUSCULAR | Status: AC
  Filled 2016-06-29: qty 2

## 2016-06-29 MED ORDER — CHLORHEXIDINE GLUCONATE 0.12% ORAL RINSE (MEDLINE KIT)
15.0000 mL | Freq: Two times a day (BID) | OROMUCOSAL | Status: DC
  Administered 2016-06-29 – 2016-07-19 (×37): 15 mL via OROMUCOSAL

## 2016-06-29 MED ORDER — ETOMIDATE 2 MG/ML IV SOLN
20.0000 mg | Freq: Once | INTRAVENOUS | Status: AC
  Administered 2016-06-29: 20 mg via INTRAVENOUS

## 2016-06-29 MED ORDER — FUROSEMIDE 10 MG/ML IJ SOLN
INTRAMUSCULAR | Status: AC
  Filled 2016-06-29: qty 4

## 2016-06-29 MED ORDER — ACETAMINOPHEN 160 MG/5ML PO SOLN
650.0000 mg | Freq: Four times a day (QID) | ORAL | Status: DC | PRN
  Administered 2016-06-30 – 2016-07-13 (×3): 650 mg
  Filled 2016-06-29 (×4): qty 20.3

## 2016-06-29 MED ORDER — ROCURONIUM BROMIDE 50 MG/5ML IV SOLN
60.0000 mg | Freq: Once | INTRAVENOUS | Status: AC
  Administered 2016-06-29: 60 mg via INTRAVENOUS
  Filled 2016-06-29: qty 6

## 2016-06-29 MED ORDER — ORAL CARE MOUTH RINSE
15.0000 mL | Freq: Four times a day (QID) | OROMUCOSAL | Status: DC
  Administered 2016-06-29 – 2016-07-19 (×77): 15 mL via OROMUCOSAL

## 2016-06-29 MED ORDER — VANCOMYCIN HCL IN DEXTROSE 750-5 MG/150ML-% IV SOLN
750.0000 mg | Freq: Two times a day (BID) | INTRAVENOUS | Status: DC
  Administered 2016-06-30 – 2016-07-02 (×5): 750 mg via INTRAVENOUS
  Filled 2016-06-29 (×6): qty 150

## 2016-06-29 MED ORDER — ACETAMINOPHEN 160 MG/5ML PO SOLN
325.0000 mg | ORAL | Status: DC | PRN
  Administered 2016-06-29: 650 mg

## 2016-06-29 MED ORDER — MUPIROCIN 2 % EX OINT
1.0000 "application " | TOPICAL_OINTMENT | Freq: Two times a day (BID) | CUTANEOUS | Status: AC
  Administered 2016-06-29 – 2016-07-04 (×10): 1 via NASAL
  Filled 2016-06-29 (×3): qty 22

## 2016-06-29 MED ORDER — FENTANYL CITRATE (PF) 100 MCG/2ML IJ SOLN
50.0000 ug | INTRAMUSCULAR | Status: DC | PRN
  Administered 2016-06-29: 50 ug via INTRAVENOUS

## 2016-06-29 MED ORDER — FENTANYL CITRATE (PF) 100 MCG/2ML IJ SOLN
50.0000 ug | Freq: Once | INTRAMUSCULAR | Status: AC
  Administered 2016-06-29: 50 ug via INTRAVENOUS

## 2016-06-29 MED ORDER — SODIUM CHLORIDE 0.9 % IV SOLN
25.0000 ug/h | INTRAVENOUS | Status: DC
  Administered 2016-06-29: 100 ug/h via INTRAVENOUS
  Administered 2016-06-30: 125 ug/h via INTRAVENOUS
  Administered 2016-07-01: 175 ug/h via INTRAVENOUS
  Administered 2016-07-02: 100 ug/h via INTRAVENOUS
  Administered 2016-07-03: 150 ug/h via INTRAVENOUS
  Administered 2016-07-04: 50 ug/h via INTRAVENOUS
  Filled 2016-06-29 (×8): qty 50

## 2016-06-29 NOTE — Progress Notes (Signed)
Physical Therapy Weekly Progress Note  Patient Details  Name: Gregory [name] MRN: [id number] Date of Birth: 1940-07-23  Beginning of progress report period: June 21, 2016 End of progress report period: June 29, 2016  Today's Date: 06/29/2016 PT Individual Time: 1100-1135 PT Individual Time Calculation (min): 35 min   Patient has met 2 of 4 short term goals.  Pt has made good progress in therapy this week with functional mobility, but continues to be limited by lethargy.  At end of reporting period pt with increased fever and fatigue which has limited his participation in the last two days.  Pending medical work up.   Patient continues to demonstrate the following deficits muscle weakness, decreased cardiorespiratoy endurance, unbalanced muscle activation, ataxia, decreased coordination and decreased motor planning, decreased midline orientation and decreased attention to left, decreased initiation, decreased attention, decreased awareness, decreased problem solving, decreased safety awareness, decreased memory and delayed processing and decreased sitting balance, decreased standing balance, decreased postural control and decreased balance strategies and therefore will continue to benefit from skilled PT intervention to increase functional independence with mobility.  Patient progressing toward long term goals..  Continue plan of care.  PT Short Term Goals Week 1:  PT Short Term Goal 1 (Week 1): Pt will demonstrate bed mobility with mod assist and use of bed features PT Short Term Goal 1 - Progress (Week 1): Progressing toward goal PT Short Term Goal 2 (Week 1): Pt will transfer with +1 assist consistently PT Short Term Goal 2 - Progress (Week 1): Progressing toward goal PT Short Term Goal 3 (Week 1): Pt will ambulate 30' with +2 assist PT Short Term Goal 3 - Progress (Week 1): Met PT Short Term Goal 4 (Week 1): Pt will propel w/c 20' wtih min assist PT Short Term Goal 4 - Progress  (Week 1): Met Week 2:  PT Short Term Goal 1 (Week 2): Pt will transfer with +1 assist. PT Short Term Goal 2 (Week 2): Pt will propel w/c 150' with min assist PT Short Term Goal 3 (Week 2): Pt will ambulate 72' with LRAD and +1 assist  Skilled Therapeutic Interventions/Progress Updates:    RN in room on arrival, reporting pt has a fever.  Pt fluctuating between somnolent and lethargic throughout session, focus on maintaining arousal, bridging, rolling L and R for changing brief, and following 1-step commands for self care. Pt unable to maintain arousal and missed 25 minutes of skilled PT.    Therapy Documentation Precautions:  Precautions Precautions: Back, Fall Precaution Comments: cortrak tube, fall (parkinson's and recent CVA) Required Braces or Orthoses: Spinal Brace Spinal Brace: Applied in supine position, Thoracolumbosacral orthotic Restrictions Weight Bearing Restrictions: No   See Function Navigator for Current Functional Status.  Therapy/Group: Individual Therapy  Earnest Conroy Penven-Crew 06/29/2016, 12:18 PM

## 2016-06-29 NOTE — Progress Notes (Signed)
Patient with leucocytosis and febrile this am with Temp 102. Has had non-productive cough for past 1-2 and increase in fatigue lethargy past 24 hours. BC X 2, sputum culture and flu panel sent. Urine culture 2/6 negative and hematuria almost resolved. CXR done showing concerns of PNA with underlying fluid overload. Will diurese with lasix 40 mg IV and start IV Vanc/Zosyn for treatment. Will consult [facility] hospitalist for input
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
## 2016-06-29 NOTE — Progress Notes (Signed)
SLP Cancellation Note  Patient Details Name: Gregory LoronRichard Chatwin MRN: [id number] DOB: 03/10/1941   Cancelled treatment:    Patient missed 30 minutes of skilled SLP intervention due to decline in medical status.                                                                                                [name] 06/29/2016, 3:06 PM

## 2016-06-29 NOTE — Progress Notes (Signed)
Patient still febrile but with gugling sounds. Was suctioned with return of bloody fluid. RR nurse called. PCCM was contacted for assistance and patient transferred to ICU.

## 2016-06-29 NOTE — Plan of Care (Signed)
Problem: RH BOWEL ELIMINATION Goal: RH STG MANAGE BOWEL W/MEDICATION W/ASSISTANCE STG Manage Bowel with Medication with min Assistance.  Outcome: Not Progressing Having small to smear Bms with the use of suppository.

## 2016-06-29 NOTE — Progress Notes (Signed)
eLink Physician-Brief Progress Note Patient Name: Gregory LoronRichard Kreager DOB: 04/10/1941 MRN: [id number]   Date of Service  06/29/2016  HPI/Events of Note  Patient was transferred to the unit this afternoon for respiratory distress. Concern for aspiration pneumonia and healthcare associated pneumonia. He was hypotensive postintubation. Blood pressure improved after 3 L. Blood pressure started to drop again. Currently  80/60 with map of 59, heart rate in the 70s, sats 100% on 50% FiO2.   Chest x-ray with increased vascular markings, pulmonary edema.   eICU Interventions  Will need  CVL >> discussed with [place].  Will start neosynephrine pending CVL.         Kerstin Crusoe Bridgette Habermannngelo A De Dios 06/29/2016, 4:11 PM

## 2016-06-29 NOTE — Significant Event (Signed)
Rapid Response Event Note  Overview: Time Called: 1302 Arrival Time: 1303 Event Type: Hypotension, Other (Comment)  Initial Focused Assessment: Upon my arrival RN suctioning bright red blood from patients mouth.  There is about 150cc of blood in the suction canister. BP 109/48  AF 81  RR 24  O2 sat 96% on RA  Temp 101.2 Lung sounds rhonchi, heart tones irregular. Per Staff patient has been more lethargic since yesterday. Patient's skin is hot to touch Patient is responsive but does not follow commands or interact with staff.  Rn has stopped the heparin gtt.  Interventions: NS bolus Continued to manage airway Labs drawn Gregory [name] at bedside to assess patient. BP 84/53  AF 78  RR 22  O2 sat 96% on 4L Clamped Zosyn infusion and started 2nd NS inusion.  Transported to 2M06 via bed with O2 and zoll monitor RN at bedside to receive patient.  Updated on change in BP   Plan of Care (if not transferred):  Event Summary: Name of Physician Notified: Gregory NestlePam Love PA at bedside at    Name of Consulting Physician Notified: Gregory [name]/  [name] NP at 1305  Outcome: Transferred (Comment)  Event End Time: 1402  Gregory [name], Gregory [name]

## 2016-06-29 NOTE — Progress Notes (Signed)
0830: Pt. requiring max cues to open eyes, unable to follow one step commands.  Dr. Wynn BankerKirsteins in, orders received.  1000:  Pt flushed, Temp. 102.5 rectally, B/P 109/48, HR 71, Sat. 97% on room air. Marissa NestlePam Love [facility] aware, orders received. Bld.Cx, CXR,KUB, Flu swab, sputum Cx.;all orders carried through. Tylenol given at 1109, IV Lasix 40mg  given at 1229 after PA reviewed results of CXR. Zosyn hung at 1200. B/P at 1113 112/50, HR80.  1240:  Pts temp.101.5 rectally. Pt resting in no obvious distress. 1300: Pt with wet cough, began coughing up large amount bright red blood. Heparin drip stopped, Rapid Response called, arrived to room at 1302. See RR note.  Pt transferred off unit to 2M06 at 1340.

## 2016-06-29 NOTE — Progress Notes (Signed)
Pharmacy Antibiotic Note  Gregory [name] is a 76 y.o. male admitted on 06/29/2016 with pneumonia.  Pharmacy has been consulted for Vancomycin and Zosyn dosing.   Patient with recent hospitalization from 1/12 to 1/31 and then has been in inpatient rehab up until today when patient experienced rapid response call for bloody suctioning (150cc), respiratory distress, and hypotension. Patient also had new fever and leukocytosis - vancomycin and Zosyn were originally ordered in rehab but vancomycin was never given and Zosyn was started but stopped very soon after without much at all infused.   Patient has known CKD and SCr is up to 1.1 (may be new baseline).  Patient is currently having good UOP.  Tm was 102 per notes but patient is currently afebrile.   CXR concerning for pneumonia.  Plan: Vancomycin 1500mg  IV x1 then 750mg  IV every 12 hours.  Goal trough 15-20 mcg/mL. Zosyn 3.375g IV q8h (4 hour infusion). Follow-up renal function, culture results, and clinical status.      Temp (24hrs), Avg:98.7 F (37.1 C), Min:98.7 F (37.1 C), Max:98.7 F (37.1 C)   Recent Labs Lab 06/26/16 0418 06/27/16 0832 06/28/16 0456 06/29/16 0557 06/29/16 1308  WBC 9.7 9.1 10.4 15.1* 19.7*  CREATININE 1.04 0.85 0.85 1.04 1.10    Estimated Creatinine Clearance: 66 mL/min (by C-G formula based on SCr of 1.1 mg/dL).    No Known Allergies  Antimicrobials this admission: Vancomycin 2/9 >> Zosyn 2/9 >>  Dose adjustments this admission:  Microbiology results: 2/9 Sputum:  2/9 MRSA PCR:   Thank you for allowing pharmacy to be a part of this patient's care.  Link SnufferJessica Arletta Lumadue, PharmD, BCPS Clinical Pharmacist Clinical phone 06/29/2016 until 3:30 PM - [phone] After hours, please call [phone] 06/29/2016 2:10 PM

## 2016-06-29 NOTE — Procedures (Signed)
Central Venous Catheter Insertion Procedure Note Gregory [name] [id number] Jul 13, 1940  Procedure: Insertion of Central Venous Catheter Indications: Assessment of intravascular volume, Drug and/or fluid administration and Frequent blood sampling  Procedure Details Consent: Risks of procedure as well as the alternatives and risks of each were explained to the (patient/caregiver).  Consent for procedure obtained. Time Out: Verified patient identification, verified procedure, site/side was marked, verified correct patient position, special equipment/implants available, medications/allergies/relevent history reviewed, required imaging and test results available.  Performed  Maximum sterile technique was used including antiseptics, cap, gloves, gown, hand hygiene, mask and sheet. Skin prep: Chlorhexidine; local anesthetic administered A antimicrobial bonded/coated triple lumen catheter was placed in the right internal jugular vein using the Seldinger technique.  Evaluation Blood flow good Complications: No apparent complications Patient did tolerate procedure well. Chest X-ray ordered to verify placement.  CXR: pending.  Gregory [name], AG-ACNP [name] [facility]  Pgr: [phone]  PCCM Pgr: [phone]  Gregory [name], M.D. [facility]. Pager: [phone]. After hours pager: [phone].

## 2016-06-29 NOTE — Procedures (Signed)
Intubation Procedure Note Gregory [name] [id number] 1940-07-04  Procedure: Intubation Indications: Respiratory insufficiency  Procedure Details Consent: Risks of procedure as well as the alternatives and risks of each were explained to the (patient/caregiver).  Consent for procedure obtained. Time Out: Verified patient identification, verified procedure, site/side was marked, verified correct patient position, special equipment/implants available, medications/allergies/relevent history reviewed, required imaging and test results available.  Performed  Maximum sterile technique was used including antiseptics, cap, gloves, hand hygiene and mask.  MAC and 4 Glide scope    Evaluation Hemodynamic Status: BP stable throughout; O2 sats: stable throughout Patient's Current Condition: stable Complications: No apparent complications Patient did tolerate procedure well. Chest X-ray ordered to verify placement.  CXR: pending.   Gregory [name] 06/29/2016  Erick Colace ACNP-BC [facility] Pager # [phone] OR # [phone] if no answer

## 2016-06-29 NOTE — Progress Notes (Signed)
eLink Physician-Brief Progress Note Patient Name: Gregory [name] DOB: 1940/08/05 MRN: [id number]   Date of Service  06/29/2016  HPI/Events of Note  CXR post R IJ >> good position  More pulm edema and pleural effusion.   eICU Interventions  Cont pressors.  Will hold off on further fluid boluses.      Intervention Category Evaluation Type: Other  Jose Bridgette Habermannngelo A De Dios 06/29/2016, 7:53 PM
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
## 2016-06-29 NOTE — Progress Notes (Signed)
Subjective/Complaints: Per nursing, has fluctuating mental status, which is not new. No apparent change with Ritalin yesterday. he coughs mainly when he is in bed. Does better when he is up in the chair. He responds to questions but does not initiate any speech needs cues to open eyes Urine is clear Review of systems Limited by Mental status     Objective: Vital Signs: Blood pressure 129/60, pulse 78, temperature 98.7 F (37.1 C), temperature source Axillary, resp. rate 19, height _0  (1.803 m), weight 91.4 kg (201 lb 6.4 oz), SpO2 98 %. No results found. Results for orders placed or performed during the hospital encounter of 06/20/16 (from the past 72 hour(s))  Culture, Urine     Status: None   Collection Time: 06/26/16 11:59 AM  Result Value Ref Range   Specimen Description URINE, CATHETERIZED    Special Requests NONE    Culture NO GROWTH    Report Status 06/27/2016 FINAL   Glucose, capillary     Status: None   Collection Time: 06/26/16 12:01 PM  Result Value Ref Range   Glucose-Capillary 75 65 - 99 mg/dL  Glucose, capillary     Status: None   Collection Time: 06/26/16  4:44 PM  Result Value Ref Range   Glucose-Capillary 73 65 - 99 mg/dL  Glucose, capillary     Status: None   Collection Time: 06/26/16  8:10 PM  Result Value Ref Range   Glucose-Capillary 82 65 - 99 mg/dL   Comment 1 Notify RN   Glucose, capillary     Status: None   Collection Time: 06/27/16 12:26 AM  Result Value Ref Range   Glucose-Capillary 72 65 - 99 mg/dL  Glucose, capillary     Status: None   Collection Time: 06/27/16  4:36 AM  Result Value Ref Range   Glucose-Capillary 81 65 - 99 mg/dL  Glucose, capillary     Status: Abnormal   Collection Time: 06/27/16  8:14 AM  Result Value Ref Range   Glucose-Capillary 100 (H) 65 - 99 mg/dL  Heparin level (unfractionated)     Status: None   Collection Time: 06/27/16  8:32 AM  Result Value Ref Range   Heparin Unfractionated 0.34 0.30 - 0.70 IU/mL     Comment:        IF HEPARIN RESULTS ARE BELOW EXPECTED VALUES, AND PATIENT DOSAGE HAS BEEN CONFIRMED, SUGGEST FOLLOW UP TESTING OF ANTITHROMBIN III LEVELS.   CBC     Status: Abnormal   Collection Time: 06/27/16  8:32 AM  Result Value Ref Range   WBC 9.1 4.0 - 10.5 K/uL   RBC 3.71 (L) 4.22 - 5.81 MIL/uL   Hemoglobin 10.3 (L) 13.0 - 17.0 g/dL   HCT 34.0 (L) 39.0 - 52.0 %   MCV 91.6 78.0 - 100.0 fL   MCH 27.8 26.0 - 34.0 pg   MCHC 30.3 30.0 - 36.0 g/dL   RDW 16.9 (H) 11.5 - 15.5 %   Platelets 383 150 - 400 K/uL  Renal function panel     Status: Abnormal   Collection Time: 06/27/16  8:32 AM  Result Value Ref Range   Sodium 144 135 - 145 mmol/L   Potassium 3.5 3.5 - 5.1 mmol/L   Chloride 111 101 - 111 mmol/L   CO2 26 22 - 32 mmol/L   Glucose, Bld 110 (H) 65 - 99 mg/dL   BUN 27 (H) 6 - 20 mg/dL   Creatinine, Ser 0.85 0.61 - 1.24 mg/dL   Calcium  9.1 8.9 - 10.3 mg/dL   Phosphorus 2.8 2.5 - 4.6 mg/dL   Albumin 2.6 (L) 3.5 - 5.0 g/dL   GFR calc non Af Amer >60 >60 mL/min   GFR calc Af Amer >60 >60 mL/min    Comment: (NOTE) The eGFR has been calculated using the CKD EPI equation. This calculation has not been validated in all clinical situations. eGFR's persistently <60 mL/min signify possible Chronic Kidney Disease.    Anion gap 7 5 - 15  Glucose, capillary     Status: None   Collection Time: 06/27/16 11:43 AM  Result Value Ref Range   Glucose-Capillary 79 65 - 99 mg/dL  Glucose, capillary     Status: None   Collection Time: 06/27/16  3:51 PM  Result Value Ref Range   Glucose-Capillary 68 65 - 99 mg/dL  Glucose, capillary     Status: None   Collection Time: 06/27/16  8:00 PM  Result Value Ref Range   Glucose-Capillary 87 65 - 99 mg/dL   Comment 1 Notify RN   Glucose, capillary     Status: None   Collection Time: 06/27/16  8:18 PM  Result Value Ref Range   Glucose-Capillary 83 65 - 99 mg/dL  Glucose, capillary     Status: None   Collection Time: 06/27/16 11:35 PM   Result Value Ref Range   Glucose-Capillary 80 65 - 99 mg/dL   Comment 1 Notify RN   Glucose, capillary     Status: None   Collection Time: 06/28/16  3:40 AM  Result Value Ref Range   Glucose-Capillary 85 65 - 99 mg/dL   Comment 1 Notify RN   Renal function panel     Status: Abnormal   Collection Time: 06/28/16  4:56 AM  Result Value Ref Range   Sodium 145 135 - 145 mmol/L   Potassium 4.4 3.5 - 5.1 mmol/L    Comment: DELTA CHECK NOTED   Chloride 108 101 - 111 mmol/L   CO2 27 22 - 32 mmol/L   Glucose, Bld 117 (H) 65 - 99 mg/dL   BUN 25 (H) 6 - 20 mg/dL   Creatinine, Ser 0.85 0.61 - 1.24 mg/dL   Calcium 9.2 8.9 - 10.3 mg/dL   Phosphorus 2.9 2.5 - 4.6 mg/dL   Albumin 2.6 (L) 3.5 - 5.0 g/dL   GFR calc non Af Amer >60 >60 mL/min   GFR calc Af Amer >60 >60 mL/min    Comment: (NOTE) The eGFR has been calculated using the CKD EPI equation. This calculation has not been validated in all clinical situations. eGFR's persistently <60 mL/min signify possible Chronic Kidney Disease.    Anion gap 10 5 - 15  Heparin level (unfractionated)     Status: None   Collection Time: 06/28/16  4:56 AM  Result Value Ref Range   Heparin Unfractionated 0.37 0.30 - 0.70 IU/mL    Comment:        IF HEPARIN RESULTS ARE BELOW EXPECTED VALUES, AND PATIENT DOSAGE HAS BEEN CONFIRMED, SUGGEST FOLLOW UP TESTING OF ANTITHROMBIN III LEVELS.   CBC     Status: Abnormal   Collection Time: 06/28/16  4:56 AM  Result Value Ref Range   WBC 10.4 4.0 - 10.5 K/uL   RBC 3.63 (L) 4.22 - 5.81 MIL/uL   Hemoglobin 9.9 (L) 13.0 - 17.0 g/dL   HCT 33.3 (L) 39.0 - 52.0 %   MCV 91.7 78.0 - 100.0 fL   MCH 27.3 26.0 - 34.0 pg  MCHC 29.7 (L) 30.0 - 36.0 g/dL   RDW 17.0 (H) 11.5 - 15.5 %   Platelets 335 150 - 400 K/uL  Glucose, capillary     Status: None   Collection Time: 06/28/16  8:16 AM  Result Value Ref Range   Glucose-Capillary 97 65 - 99 mg/dL  Glucose, capillary     Status: None   Collection Time: 06/28/16  12:08 PM  Result Value Ref Range   Glucose-Capillary 83 65 - 99 mg/dL  Glucose, capillary     Status: None   Collection Time: 06/28/16  4:06 PM  Result Value Ref Range   Glucose-Capillary 75 65 - 99 mg/dL  Glucose, capillary     Status: None   Collection Time: 06/28/16  8:15 PM  Result Value Ref Range   Glucose-Capillary 93 65 - 99 mg/dL  Glucose, capillary     Status: Abnormal   Collection Time: 06/29/16 12:40 AM  Result Value Ref Range   Glucose-Capillary 104 (H) 65 - 99 mg/dL  Glucose, capillary     Status: None   Collection Time: 06/29/16  3:35 AM  Result Value Ref Range   Glucose-Capillary 92 65 - 99 mg/dL  Heparin level (unfractionated)     Status: Abnormal   Collection Time: 06/29/16  5:57 AM  Result Value Ref Range   Heparin Unfractionated <0.10 (L) 0.30 - 0.70 IU/mL    Comment:        IF HEPARIN RESULTS ARE BELOW EXPECTED VALUES, AND PATIENT DOSAGE HAS BEEN CONFIRMED, SUGGEST FOLLOW UP TESTING OF ANTITHROMBIN III LEVELS.   CBC     Status: Abnormal   Collection Time: 06/29/16  5:57 AM  Result Value Ref Range   WBC 15.1 (H) 4.0 - 10.5 K/uL   RBC 3.70 (L) 4.22 - 5.81 MIL/uL   Hemoglobin 10.4 (L) 13.0 - 17.0 g/dL   HCT 33.5 (L) 39.0 - 52.0 %   MCV 90.5 78.0 - 100.0 fL   MCH 28.1 26.0 - 34.0 pg   MCHC 31.0 30.0 - 36.0 g/dL   RDW 17.3 (H) 11.5 - 15.5 %   Platelets 228 150 - 400 K/uL  Renal function panel     Status: Abnormal   Collection Time: 06/29/16  5:57 AM  Result Value Ref Range   Sodium 139 135 - 145 mmol/L   Potassium 4.0 3.5 - 5.1 mmol/L   Chloride 104 101 - 111 mmol/L   CO2 21 (L) 22 - 32 mmol/L   Glucose, Bld 121 (H) 65 - 99 mg/dL   BUN 25 (H) 6 - 20 mg/dL   Creatinine, Ser 1.04 0.61 - 1.24 mg/dL   Calcium 8.9 8.9 - 10.3 mg/dL   Phosphorus 2.3 (L) 2.5 - 4.6 mg/dL   Albumin 2.6 (L) 3.5 - 5.0 g/dL   GFR calc non Af Amer >60 >60 mL/min   GFR calc Af Amer >60 >60 mL/min    Comment: (NOTE) The eGFR has been calculated using the CKD EPI  equation. This calculation has not been validated in all clinical situations. eGFR's persistently <60 mL/min signify possible Chronic Kidney Disease.    Anion gap 14 5 - 15  Heparin level (unfractionated)     Status: Abnormal   Collection Time: 06/29/16  7:28 AM  Result Value Ref Range   Heparin Unfractionated 0.29 (L) 0.30 - 0.70 IU/mL    Comment:        IF HEPARIN RESULTS ARE BELOW EXPECTED VALUES, AND PATIENT DOSAGE HAS BEEN CONFIRMED, SUGGEST FOLLOW  UP TESTING OF ANTITHROMBIN III LEVELS.       General: No acute distress Mood and affect are appropriate Heart: Irregularly irregular Lungs: Clear to auscultation, breathing unlabored, no rales or wheezes Abdomen: Positive bowel sounds, soft nontender to palpation, nondistended Extremities: No clubbing, cyanosis, or edema Skin: No evidence of breakdown, no evidence of rash Neurologic: motor strength is 4/5 in bilateral deltoid, bicep, tricep, grip, hip flexor, knee extensors, ankle dorsiflexor and plantar flexor  Movements are slow. Needs encouragement and cueing Musculoskeletal: No pain with upper or lower extremity muscle testing. Tenderness over the right chest   Assessment/Plan: 1. 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 which require 3+ hours per day of interdisciplinary therapy in a comprehensive inpatient rehab setting. Physiatrist is providing close team supervision and 24 hour management of active medical problems listed below. Physiatrist and rehab team continue to assess barriers to discharge/monitor patient progress toward functional and medical goals. FIM: Function - Bathing Position: Shower Body parts bathed by patient: Right arm, Left arm, Chest, Abdomen, Right upper leg Body parts bathed by helper: Front perineal area, Buttocks, Back Bathing not applicable: Left upper leg, Right lower leg, Left lower leg Assist Level: 2 helpers (Mod  assist with washing buttocks while standing in stedy)  Function- Upper Body Dressing/Undressing What is the patient wearing?: Pull over shirt/dress, Orthosis Pull over shirt/dress - Perfomed by patient: Thread/unthread right sleeve, Thread/unthread left sleeve Pull over shirt/dress - Perfomed by helper: Put head through opening, Pull shirt over trunk Orthosis activity level: Performed by helper Assist Level: 2 helpers Function - Lower Body Dressing/Undressing What is the patient wearing?: Pants Position: Bed Pants- Performed by helper: Thread/unthread right pants leg, Thread/unthread left pants leg, Pull pants up/down Non-skid slipper socks- Performed by helper: Don/doff right sock, Don/doff left sock Socks - Performed by helper: Don/doff right sock, Don/doff left sock Shoes - Performed by helper: Don/doff right shoe, Don/doff left shoe, Fasten right, Fasten left Assist for footwear: Dependant Assist for lower body dressing: 2 Helpers  Function - Toileting Toileting activity did not occur: No continent bowel/bladder event Toileting steps completed by helper: Adjust clothing prior to toileting, Performs perineal hygiene, Adjust clothing after toileting Toileting Assistive Devices: Grab bar or rail Assist level: Two helpers  Function - Air cabin crew transfer assistive device: Bedside commode Assist level to toilet: Moderate assist (Pt 50 - 74%/lift or lower) Assist level from toilet: Moderate assist (Pt 50 - 74%/lift or lower) Assist level to bedside commode (at bedside): 2 helpers (fatigued) Assist level from bedside commode (at bedside): 2 helpers  Function - Chair/bed transfer Chair/bed transfer method: Stand pivot Chair/bed transfer assist level: 2 helpers Chair/bed transfer assistive device: Armrests Chair/bed transfer details: Verbal cues for technique, Tactile cues for posture, Verbal cues for sequencing, Manual facilitation for weight shifting  Function -  Locomotion: Wheelchair Will patient use wheelchair at discharge?: Yes Type: Manual Max wheelchair distance: 60 Assist Level: Touching or steadying assistance (Pt > 75%) Assist Level: Touching or steadying assistance (Pt > 75%) Assist Level: Dependent (Pt equals 0%) Turns around,maneuvers to table,bed, and toilet,negotiates 3% grade,maneuvers on rugs and over doorsills: No Function - Locomotion: Ambulation Assistive device: Walker-rolling Max distance: 65 Assist level: 2 helpers (mod assist for pt and RW, +2 required to manage IV pole) Walk 10 feet activity did not occur: Safety/medical concerns Assist level: 2 helpers Walk 50 feet with 2 turns activity did not occur: Safety/medical concerns Assist level:  2 helpers Walk 150 feet activity did not occur: Safety/medical concerns Walk 10 feet on uneven surfaces activity did not occur: Safety/medical concerns  Function - Comprehension Comprehension: Auditory Comprehension assist level: Understands basic 25 - 49% of the time/ requires cueing 50 - 75% of the time  Function - Expression Expression: Verbal Expression assist level: Expresses basic 50 - 74% of the time/requires cueing 25 - 49% of the time. Needs to repeat parts of sentences.  Function - Social Interaction Social Interaction assist level: Interacts appropriately 25 - 49% of time - Needs frequent redirection.  Function - Problem Solving Problem solving assist level: Solves basic 25 - 49% of the time - needs direction more than half the time to initiate, plan or complete simple activities  Function - Memory Memory assist level: Recognizes or recalls less than 25% of the time/requires cueing greater than 75% of the time Patient normally able to recall (first 3 days only): Current season, That he or she is in a hospital  Medical Problem List and Plan: 1. Cognitive deficits, weakness, limitations in self-care secondary to right CVA.Multitrauma is major limiting factor with  mobility, CVA, mainly causing swallowing issues as well as balance and cognitive issues. Moderate increased Ritalin today CIR PT, OT, speech,   2. DVT Prophylaxis/Anticoagulation: Pharmaceutical: Heparin, until taking by mouth 3. Pain Management: tylenol prn 4. Mood: Hard to determine at this time due cognitive impairments. LCSW to follow along for evaluation as appropriate.  5. Neuropsych: This patient is not capable of making decisions on his own behalf. 6. Skin/Wound Care: routine pressure relief measures.  7. Fluids/Electrolytes/Nutrition: Add water flushes to help with hydration.  Still nothing by mouth reassess swallowing, advise on PEG in 1 week        8. Parkinsonism:On sinemet qid.  9. PAF: Now in NSR and controlled at this time. Monitor HR bid. Continue heparin per Pharm protocol--Eliquis once able to start po intake. Would not switch from heparin until decision to place peg [facility] cardiology consult 10. Acute renal injury: Due to BOO. Continue foley for monitoring I/O. Renal status improving  11. Acute Congestive heart failure: Monitor daily weights--down to 203 lbs. Monitor for signs of overload. 12. UTI: Completed Cipro, rechecked UA +, C&S, negative 13. Hematuria: Continue to monitor output for now. Hemoglobins have been in the 9-10 range. One value of 11, which was spurious Appreciate urology consult, no interventions recommended at the current time 14. Hypernatremia: Resolved. Discontinue daily bmet 15. T12 fracture. Stable, per neurosurgery, may don and doff orthosis while seated on bed, shower with orthosis off 16.  Pre renal azotemia, we'll need to increase H2O flushes,  BUN improved on 2/9 lab work 17. Cough, at risk for aspiration, recheck chest x-ray white count is up to 15 K LOS (Days) 9 A FACE TO FACE EVALUATION WAS PERFORMED  [name],ANDREW E 06/29/2016, 8:21 AM

## 2016-06-29 NOTE — Progress Notes (Signed)
Occupational Therapy Session Note  Patient Details  Name: Gregory [name] MRN: [id number] Date of Birth: August 22, 1940  Today's Date: 06/29/2016 OT Individual Time: [id number] OT Individual Time Calculation (min): 55 min    Short Term Goals: Week 2:  OT Short Term Goal 1 (Week 2): Pt will complete toilet transfers with mod assist of one caregiver  OT Short Term Goal 2 (Week 2): Pt will complete bathing at sit > stand level with mod assist of one caregiver OT Short Term Goal 3 (Week 2): Pt will complete LB dressing with max assist of one caregiver OT Short Term Goal 4 (Week 2): Pt will attend to task in standing for 5 mins to increase arousal and participation  Skilled Therapeutic Interventions/Progress Updates:    Pt seen this session to facilitate arousal, participation, sit to stand. +2 available this session from rehab tech. Pt would briefly open eyes to look at therapist and did respond with one word answers. Plan was to use stedy to transfer pt from bed to w/c to then work on sit to stands with stedy for LB dressing to work on [employer] FinancialSTGs #2 and #3.  With multiple attempts pt needed total A of 2 to roll to L,, to sit up, to maintain balance while donning back brace (pt leaning heavily to the L).  Attempted 5x to have pt use arms on stedy and total A of 2 to stand pt but it was not possible to lift his hips off the bed even with the bed elevated. Despite his severe lethargy, pt seemed to understand our goal but was not able to engage.  Pt transferred back to supine. He had a moderate amount of stool in his brief. Worked on rolling with total A several times for cleanup, clothing change, adjustment in bed.  Pt then sat up to "chair" position in bed to see if alertness would increase. It did not, pt adjusted with HOB elevated, B wrist restraints on, bed alarm set, 3 siderails up.   Therapy Documentation Precautions:  Precautions Precautions: Back, Fall Precaution Comments: cortrak tube, fall  (parkinson's and recent CVA) Required Braces or Orthoses: Spinal Brace Spinal Brace: Applied in supine position, Thoracolumbosacral orthotic Restrictions Weight Bearing Restrictions: No   Pain: no vocalizations of pain   ADL:  See Function Navigator for Current Functional Status.   Therapy/Group: Individual Therapy  Gregory [name] 06/29/2016, 8:31 AM

## 2016-06-29 NOTE — Progress Notes (Signed)
Patient admitted to CIR/inpt rehab admission 06/20/16. Noted transfer to acute. I will follow at a distance. [phone]

## 2016-06-30 ENCOUNTER — Inpatient Hospital Stay (HOSPITAL_COMMUNITY): Payer: Medicare Other

## 2016-06-30 DIAGNOSIS — I4891 Unspecified atrial fibrillation: Secondary | ICD-10-CM

## 2016-06-30 DIAGNOSIS — G9340 Encephalopathy, unspecified: Secondary | ICD-10-CM

## 2016-06-30 LAB — CBC
HCT: 25.9 % — ABNORMAL LOW (ref 39.0–52.0)
HEMATOCRIT: 26.6 % — AB (ref 39.0–52.0)
Hemoglobin: 8.3 g/dL — ABNORMAL LOW (ref 13.0–17.0)
Hemoglobin: 8 g/dL — ABNORMAL LOW (ref 13.0–17.0)
MCH: 28 pg (ref 26.0–34.0)
MCH: 27.9 pg (ref 26.0–34.0)
MCHC: 31.2 g/dL (ref 30.0–36.0)
MCHC: 30.9 g/dL (ref 30.0–36.0)
MCV: 89.9 fL (ref 78.0–100.0)
MCV: 90.2 fL (ref 78.0–100.0)
PLATELETS: 289 10*3/uL (ref 150–400)
PLATELETS: 314 10*3/uL (ref 150–400)
RBC: 2.96 MIL/uL — ABNORMAL LOW (ref 4.22–5.81)
RBC: 2.87 MIL/uL — ABNORMAL LOW (ref 4.22–5.81)
RDW: 17.6 % — AB (ref 11.5–15.5)
RDW: 17.7 % — AB (ref 11.5–15.5)
WBC: 18.7 10*3/uL — ABNORMAL HIGH (ref 4.0–10.5)
WBC: 18.8 10*3/uL — ABNORMAL HIGH (ref 4.0–10.5)

## 2016-06-30 LAB — BLOOD GAS, ARTERIAL
Acid-base deficit: 0.8 mmol/L (ref 0.0–2.0)
Bicarbonate: 22.7 mmol/L (ref 20.0–28.0)
DRAWN BY: 24485
FIO2: 30
MECHVT: 600 mL
O2 SAT: 97.1 %
PEEP/CPAP: 5 cmH2O
PH ART: 7.414 (ref 7.350–7.450)
PO2 ART: 101 mmHg (ref 83.0–108.0)
Patient temperature: 102.8
RATE: 22 resp/min
pCO2 arterial: 37.3 mmHg (ref 32.0–48.0)

## 2016-06-30 LAB — BASIC METABOLIC PANEL
Anion gap: 10 (ref 5–15)
BUN: 20 mg/dL (ref 6–20)
CHLORIDE: 104 mmol/L (ref 101–111)
CO2: 24 mmol/L (ref 22–32)
CREATININE: 1.03 mg/dL (ref 0.61–1.24)
Calcium: 7.6 mg/dL — ABNORMAL LOW (ref 8.9–10.3)
GFR calc Af Amer: >60 mL/min (ref >60)
GFR calc non Af Amer: >60 mL/min (ref >60)
Glucose, Bld: 139 mg/dL — ABNORMAL HIGH (ref 65–99)
Potassium: 3 mmol/L — ABNORMAL LOW (ref 3.5–5.1)
Sodium: 138 mmol/L (ref 135–145)

## 2016-06-30 LAB — URINE CULTURE: CULTURE: NO GROWTH

## 2016-06-30 LAB — PHOSPHORUS
Phosphorus: 2.3 mg/dL — ABNORMAL LOW (ref 2.5–4.6)
Phosphorus: 2.1 mg/dL — ABNORMAL LOW (ref 2.5–4.6)

## 2016-06-30 LAB — MAGNESIUM
MAGNESIUM: 2 mg/dL (ref 1.7–2.4)
Magnesium: 1.7 mg/dL (ref 1.7–2.4)

## 2016-06-30 LAB — PROCALCITONIN: PROCALCITONIN: 0.49 ng/mL

## 2016-06-30 LAB — LACTIC ACID, PLASMA: LACTIC ACID, VENOUS: 0.9 mmol/L (ref 0.5–1.9)

## 2016-06-30 MED ORDER — PHENYLEPHRINE HCL 10 MG/ML IJ SOLN
0.0000 ug/min | INTRAMUSCULAR | Status: DC
  Administered 2016-06-30 – 2016-07-01 (×2): 30 ug/min via INTRAVENOUS
  Administered 2016-07-01: 70 ug/min via INTRAVENOUS
  Filled 2016-06-30 (×5): qty 4

## 2016-06-30 MED ORDER — POTASSIUM CHLORIDE 20 MEQ/15ML (10%) PO SOLN
40.0000 meq | Freq: Once | ORAL | Status: AC
  Administered 2016-06-30: 40 meq
  Filled 2016-06-30: qty 30

## 2016-06-30 NOTE — Progress Notes (Signed)
Rn notified pt temp 100.5 F orally
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
## 2016-07-01 ENCOUNTER — Inpatient Hospital Stay (HOSPITAL_COMMUNITY): Payer: Medicare Other

## 2016-07-01 DIAGNOSIS — R6521 Severe sepsis with septic shock: Secondary | ICD-10-CM

## 2016-07-01 DIAGNOSIS — A419 Sepsis, unspecified organism: Principal | ICD-10-CM

## 2016-07-01 LAB — CBC
HEMATOCRIT: 26 % — AB (ref 39.0–52.0)
HEMOGLOBIN: 8.1 g/dL — AB (ref 13.0–17.0)
MCH: 27.9 pg (ref 26.0–34.0)
MCHC: 31.2 g/dL (ref 30.0–36.0)
MCV: 89.7 fL (ref 78.0–100.0)
Platelets: 298 10*3/uL (ref 150–400)
RBC: 2.9 MIL/uL — ABNORMAL LOW (ref 4.22–5.81)
RDW: 17.4 % — AB (ref 11.5–15.5)
WBC: 14.4 10*3/uL — ABNORMAL HIGH (ref 4.0–10.5)

## 2016-07-01 LAB — BASIC METABOLIC PANEL
Anion gap: 9 (ref 5–15)
BUN: 19 mg/dL (ref 6–20)
CHLORIDE: 107 mmol/L (ref 101–111)
CO2: 24 mmol/L (ref 22–32)
CREATININE: 0.92 mg/dL (ref 0.61–1.24)
Calcium: 8.1 mg/dL — ABNORMAL LOW (ref 8.9–10.3)
GFR calc non Af Amer: >60 mL/min (ref >60)
GLUCOSE: 146 mg/dL — AB (ref 65–99)
Potassium: 3.4 mmol/L — ABNORMAL LOW (ref 3.5–5.1)
Sodium: 140 mmol/L (ref 135–145)

## 2016-07-01 LAB — MAGNESIUM: MAGNESIUM: 1.9 mg/dL (ref 1.7–2.4)

## 2016-07-01 LAB — PHOSPHORUS: Phosphorus: 1.9 mg/dL — ABNORMAL LOW (ref 2.5–4.6)

## 2016-07-01 LAB — PROCALCITONIN: Procalcitonin: 0.37 ng/mL

## 2016-07-01 MED ORDER — DEXTROSE 5 % IV SOLN
30.0000 mmol | Freq: Once | INTRAVENOUS | Status: AC
  Administered 2016-07-01: 30 mmol via INTRAVENOUS
  Filled 2016-07-01: qty 10
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
## 2016-07-01 NOTE — Progress Notes (Signed)
eLink Physician-Brief Progress Note Patient Name: Gregory [name] DOB: [birth year] MRN: [id number]   Date of Service  07/01/2016  HPI/Events of Note  Hypokalemia and hypophos  eICU Interventions  Potassium and phos replaced     Intervention Category Intermediate Interventions: Electrolyte abnormality - evaluation and management  Gregory [name] 07/01/2016, 5:40 AM

## 2016-07-02 ENCOUNTER — Inpatient Hospital Stay (HOSPITAL_COMMUNITY): Payer: Medicare Other | Admitting: Occupational Therapy

## 2016-07-02 ENCOUNTER — Inpatient Hospital Stay (HOSPITAL_COMMUNITY): Payer: Medicare Other

## 2016-07-02 LAB — GLUCOSE, CAPILLARY
GLUCOSE-CAPILLARY: 102 mg/dL — AB (ref 65–99)
GLUCOSE-CAPILLARY: 125 mg/dL — AB (ref 65–99)
GLUCOSE-CAPILLARY: 135 mg/dL — AB (ref 65–99)
GLUCOSE-CAPILLARY: 141 mg/dL — AB (ref 65–99)
GLUCOSE-CAPILLARY: 142 mg/dL — AB (ref 65–99)
GLUCOSE-CAPILLARY: 142 mg/dL — AB (ref 65–99)
GLUCOSE-CAPILLARY: 94 mg/dL (ref 65–99)
Glucose-Capillary: 106 mg/dL — ABNORMAL HIGH (ref 65–99)
Glucose-Capillary: 111 mg/dL — ABNORMAL HIGH (ref 65–99)
Glucose-Capillary: 118 mg/dL — ABNORMAL HIGH (ref 65–99)
Glucose-Capillary: 121 mg/dL — ABNORMAL HIGH (ref 65–99)
Glucose-Capillary: 121 mg/dL — ABNORMAL HIGH (ref 65–99)
Glucose-Capillary: 122 mg/dL — ABNORMAL HIGH (ref 65–99)
Glucose-Capillary: 125 mg/dL — ABNORMAL HIGH (ref 65–99)
Glucose-Capillary: 127 mg/dL — ABNORMAL HIGH (ref 65–99)
Glucose-Capillary: 130 mg/dL — ABNORMAL HIGH (ref 65–99)
Glucose-Capillary: 138 mg/dL — ABNORMAL HIGH (ref 65–99)
Glucose-Capillary: 147 mg/dL — ABNORMAL HIGH (ref 65–99)
Glucose-Capillary: 160 mg/dL — ABNORMAL HIGH (ref 65–99)

## 2016-07-02 LAB — BLOOD GAS, ARTERIAL
ACID-BASE EXCESS: 1.5 mmol/L (ref 0.0–2.0)
BICARBONATE: 24.1 mmol/L (ref 20.0–28.0)
Drawn by: 283401
FIO2: 30
LHR: 22 {breaths}/min
O2 Saturation: 96.4 %
PATIENT TEMPERATURE: 98.6
PCO2 ART: 28.8 mmHg — AB (ref 32.0–48.0)
PEEP/CPAP: 5 cmH2O
PO2 ART: 74 mmHg — AB (ref 83.0–108.0)
VT: 600 mL
pH, Arterial: 7.532 — ABNORMAL HIGH (ref 7.350–7.450)

## 2016-07-02 LAB — BASIC METABOLIC PANEL
ANION GAP: 8 (ref 5–15)
Anion gap: 7 (ref 5–15)
BUN: 17 mg/dL (ref 6–20)
BUN: 19 mg/dL (ref 6–20)
CALCIUM: 8 mg/dL — AB (ref 8.9–10.3)
CHLORIDE: 107 mmol/L (ref 101–111)
CO2: 24 mmol/L (ref 22–32)
CO2: 26 mmol/L (ref 22–32)
Calcium: 8 mg/dL — ABNORMAL LOW (ref 8.9–10.3)
Chloride: 106 mmol/L (ref 101–111)
Creatinine, Ser: 0.89 mg/dL (ref 0.61–1.24)
Creatinine, Ser: 1.02 mg/dL (ref 0.61–1.24)
GFR calc Af Amer: >60 mL/min (ref >60)
GFR calc non Af Amer: >60 mL/min (ref >60)
GLUCOSE: 125 mg/dL — AB (ref 65–99)
Glucose, Bld: 103 mg/dL — ABNORMAL HIGH (ref 65–99)
POTASSIUM: 3.6 mmol/L (ref 3.5–5.1)
POTASSIUM: 3.8 mmol/L (ref 3.5–5.1)
Sodium: 139 mmol/L (ref 135–145)
Sodium: 139 mmol/L (ref 135–145)

## 2016-07-02 LAB — CULTURE, RESPIRATORY W GRAM STAIN

## 2016-07-02 LAB — CBC
HEMATOCRIT: 23.1 % — AB (ref 39.0–52.0)
HEMOGLOBIN: 7.1 g/dL — AB (ref 13.0–17.0)
MCH: 27.5 pg (ref 26.0–34.0)
MCHC: 30.7 g/dL (ref 30.0–36.0)
MCV: 89.5 fL (ref 78.0–100.0)
Platelets: 216 10*3/uL (ref 150–400)
RBC: 2.58 MIL/uL — AB (ref 4.22–5.81)
RDW: 17.1 % — ABNORMAL HIGH (ref 11.5–15.5)
WBC: 7.4 10*3/uL (ref 4.0–10.5)

## 2016-07-02 LAB — CULTURE, RESPIRATORY

## 2016-07-02 LAB — PHOSPHORUS: Phosphorus: 2.3 mg/dL — ABNORMAL LOW (ref 2.5–4.6)

## 2016-07-02 LAB — MAGNESIUM: Magnesium: 1.8 mg/dL (ref 1.7–2.4)

## 2016-07-02 MED ORDER — POTASSIUM CHLORIDE 20 MEQ/15ML (10%) PO SOLN
40.0000 meq | Freq: Three times a day (TID) | ORAL | Status: AC
  Administered 2016-07-02: 40 meq
  Filled 2016-07-02 (×2): qty 30

## 2016-07-02 MED ORDER — FUROSEMIDE 10 MG/ML IJ SOLN
40.0000 mg | Freq: Four times a day (QID) | INTRAMUSCULAR | Status: AC
  Administered 2016-07-02 (×3): 40 mg via INTRAVENOUS
  Filled 2016-07-02 (×3): qty 4

## 2016-07-02 MED ORDER — POTASSIUM CHLORIDE 20 MEQ/15ML (10%) PO SOLN: 40.0000 meq | Freq: Once | ORAL | Status: DC

## 2016-07-02 MED ORDER — CEFAZOLIN IN D5W 1 GM/50ML IV SOLN
1.0000 g | Freq: Three times a day (TID) | INTRAVENOUS | Status: AC
  Administered 2016-07-02 – 2016-07-06 (×12): 1 g via INTRAVENOUS
  Filled 2016-07-02 (×12): qty 50

## 2016-07-02 MED ORDER — CHLORHEXIDINE GLUCONATE CLOTH 2 % EX PADS
6.0000 | MEDICATED_PAD | Freq: Every morning | CUTANEOUS | Status: DC
  Administered 2016-07-02 – 2016-07-11 (×9): 6 via TOPICAL

## 2016-07-02 NOTE — Progress Notes (Signed)
Social Work Discharge Note  The overall goal for the admission was met for:   Discharge location: No - return to acute for medical decline  Length of Stay: No - 9 days  Discharge activity level: No - pt had only begun his therapy. Was mainly max assist overall.  Home/community participation: No  Services provided included: MD, RD, PT, OT, SLP, RN, Pharmacy and SW  Financial Services: [employer]  Follow-up services arranged: Other: Pt transferred back to acute hospital.  Comments (or additional information): Pt with medical issues requiring pt's return to acute care/ICU.  Pt's therapists feel that if pt improves medically and is able to tolerate Rehab program, they feel he would be a good rehab candidate and would like the opportunity to work with pt again.  Pt's family was planning to care for him at home after rehab stay.  CSW updated Danne Baxter, Admissions Coordinator of the above.  Patient/Family verbalized understanding of follow-up arrangements: No  Individual responsible for coordination of the follow-up plan: hospital staff with family  Confirmed correct DME delivered: Trey Sailors 07/02/2016    Terisa Belardo, Silvestre Mesi
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
## 2016-07-02 NOTE — Progress Notes (Signed)
RT Note: patient flipped back to full support after 3 hours weaning
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
## 2016-07-03 ENCOUNTER — Inpatient Hospital Stay (HOSPITAL_COMMUNITY): Payer: Medicare Other

## 2016-07-03 ENCOUNTER — Encounter (HOSPITAL_COMMUNITY): Payer: Self-pay

## 2016-07-03 DIAGNOSIS — J810 Acute pulmonary edema: Secondary | ICD-10-CM

## 2016-07-03 LAB — CBC
HEMATOCRIT: 23.5 % — AB (ref 39.0–52.0)
HEMOGLOBIN: 7.2 g/dL — AB (ref 13.0–17.0)
MCH: 27.9 pg (ref 26.0–34.0)
MCHC: 30.6 g/dL (ref 30.0–36.0)
MCV: 91.1 fL (ref 78.0–100.0)
Platelets: 269 10*3/uL (ref 150–400)
RBC: 2.58 MIL/uL — AB (ref 4.22–5.81)
RDW: 17.5 % — ABNORMAL HIGH (ref 11.5–15.5)
WBC: 7.7 10*3/uL (ref 4.0–10.5)

## 2016-07-03 LAB — GLUCOSE, CAPILLARY
GLUCOSE-CAPILLARY: 111 mg/dL — AB (ref 65–99)
GLUCOSE-CAPILLARY: 127 mg/dL — AB (ref 65–99)
GLUCOSE-CAPILLARY: 136 mg/dL — AB (ref 65–99)
Glucose-Capillary: 124 mg/dL — ABNORMAL HIGH (ref 65–99)
Glucose-Capillary: 117 mg/dL — ABNORMAL HIGH (ref 65–99)
Glucose-Capillary: 120 mg/dL — ABNORMAL HIGH (ref 65–99)

## 2016-07-03 LAB — BASIC METABOLIC PANEL
ANION GAP: 11 (ref 5–15)
BUN: 19 mg/dL (ref 6–20)
CO2: 28 mmol/L (ref 22–32)
Calcium: 8.3 mg/dL — ABNORMAL LOW (ref 8.9–10.3)
Chloride: 101 mmol/L (ref 101–111)
Creatinine, Ser: 1.08 mg/dL (ref 0.61–1.24)
GFR calc Af Amer: >60 mL/min (ref >60)
GLUCOSE: 126 mg/dL — AB (ref 65–99)
POTASSIUM: 3.5 mmol/L (ref 3.5–5.1)
Sodium: 140 mmol/L (ref 135–145)

## 2016-07-03 LAB — MAGNESIUM: Magnesium: 1.9 mg/dL (ref 1.7–2.4)

## 2016-07-03 LAB — PHOSPHORUS: PHOSPHORUS: 3 mg/dL (ref 2.5–4.6)

## 2016-07-03 LAB — HEPARIN LEVEL (UNFRACTIONATED): Heparin Unfractionated: 0.14 IU/mL — ABNORMAL LOW (ref 0.30–0.70)

## 2016-07-03 MED ORDER — FUROSEMIDE 10 MG/ML IJ SOLN
40.0000 mg | Freq: Four times a day (QID) | INTRAMUSCULAR | Status: AC
  Administered 2016-07-03 (×3): 40 mg via INTRAVENOUS
  Filled 2016-07-03 (×3): qty 4

## 2016-07-03 MED ORDER — HEPARIN (PORCINE) IN NACL 100-0.45 UNIT/ML-% IJ SOLN
1700.0000 [IU]/h | INTRAMUSCULAR | Status: DC
  Administered 2016-07-03: 1450 [IU]/h via INTRAVENOUS
  Administered 2016-07-04: 1650 [IU]/h via INTRAVENOUS
  Administered 2016-07-04 – 2016-07-05 (×3): 2050 [IU]/h via INTRAVENOUS
  Administered 2016-07-06: 1900 [IU]/h via INTRAVENOUS
  Administered 2016-07-07 – 2016-07-09 (×3): 1800 [IU]/h via INTRAVENOUS
  Filled 2016-07-03 (×22): qty 250

## 2016-07-03 MED ORDER — POTASSIUM CHLORIDE 20 MEQ/15ML (10%) PO SOLN
40.0000 meq | Freq: Three times a day (TID) | ORAL | Status: AC
  Administered 2016-07-03 (×2): 40 meq
  Filled 2016-07-03: qty 30

## 2016-07-03 NOTE — Progress Notes (Signed)
ANTICOAGULATION CONSULT NOTE   Pharmacy Consult for heparin Indication: atrial fibrillation/CVA  No Known Allergies  Patient Measurements: Weight: 217 lb 13 oz (98.8 kg)  Ideal body weight: 75 kg Heparin Dosing Weight: 95 kg  Vital Signs: Temp: 98.9 F (37.2 C) (02/13 2020) Temp Source: Oral (02/13 2020) BP: 97/54 (02/13 2026) Pulse Rate: 69 (02/13 2026)  Labs:  Recent Labs  07/01/16 0430 07/02/16 0430 07/02/16 1659 07/03/16 0400 07/03/16 2008  HGB 8.1* 7.1*  --  7.2*  --   HCT 26.0* 23.1*  --  23.5*  --   PLT 298 216  --  269  --   HEPARINUNFRC  --   --   --   --  0.14*  CREATININE 0.92 0.89 1.02 1.08  --     Estimated Creatinine Clearance: 69.7 mL/min (by C-G formula based on SCr of 1.08 mg/dL).   Medical History: Past Medical History:  Diagnosis Date  . AAA (abdominal aortic aneurysm) (HCC)   . Abnormal stress test 08/08/12  . AF (atrial fibrillation) (HCC) 2014  . Atrial fibrillation (HCC)   . Bilateral carotid artery stenosis 08/14/12   moderate bilaterally per carotid duplex  . CAD (coronary artery disease)   . Coronary artery disease   . Deformed pylorus, acquired   . Erythema of esophagus   . Esophageal stricture   . Fatty infiltration of liver   . Fatty liver   . GERD (gastroesophageal reflux disease)   . History of esophageal stricture   . Hyperlipidemia   . Parkinsonism (HCC)   . S/P cardiac cath 08/12/12  . Splenomegaly     Medications:  Prescriptions Prior to Admission  Medication Sig Dispense Refill Last Dose  . atorvastatin (LIPITOR) 10 MG tablet Take 10 mg by mouth daily.   unknown  . atorvastatin (LIPITOR) 40 MG tablet Take 40 mg by mouth daily.   Taking  . carbidopa-levodopa (SINEMET CR) 50-200 MG tablet Take 1 tablet by mouth 3 (three) times daily. Take it 9 AM, 1 PM and 5 PM. 90 tablet 5   . carbidopa-levodopa (SINEMET CR) 50-200 MG tablet Take 1 tablet by mouth 3 (three) times daily.   unknown  . Cyanocobalamin (B-12) 2500 MCG  TABS Take by mouth.   Taking  . tamsulosin (FLOMAX) 0.4 MG CAPS capsule   0 Taking  . tamsulosin (FLOMAX) 0.4 MG CAPS capsule Take 0.4 mg by mouth daily.   unknown  . Testosterone Cypionate & Prop 200-20 MG/ML SOLN Inject into the muscle.   Taking    Assessment: 76 yo M admitted 06/29/2016 with recent embolic stroke (3/241/18) secondary to R-ICA stenosis and new onset Afib. He was previously on Afib but developed bleeding with suctioning on 2/9 and heparin was held. Pharmacy has been consulted to resume heparin.   Previous heparin goal was modified to be 0.3-0.5 d/t recent liver hematoma, flank hematoma and hematuria. Will continue with this goal.  Hgb 7.2, Plt 269 stable, No current bleeding noted.  Initial heparin level low at 0.14  Goal of Therapy:  Heparin level 0.3-0.5 units/ml Monitor platelets by anticoagulation protocol: Yes   Plan:  - Heparin to 1650 units/hr  - Next level with AM labs  Thank you Okey RegalLisa Natesha Hassey, PharmD [phone] - 07/03/2016,8:44 PM

## 2016-07-03 NOTE — Progress Notes (Signed)
ANTICOAGULATION CONSULT NOTE - Initial Consult  Pharmacy Consult for heparin Indication: atrial fibrillation/CVA  No Known Allergies  Patient Measurements: Weight: 217 lb 13 oz (98.8 kg)  Ideal body weight: 75 kg Heparin Dosing Weight: 95 kg  Vital Signs: Temp: 97.7 F (36.5 C) (02/13 0801) Temp Source: Oral (02/13 0801) BP: 115/79 (02/13 1000) Pulse Rate: 67 (02/13 1000)  Labs:  Recent Labs  07/01/16 0430 07/02/16 0430 07/02/16 1659 07/03/16 0400  HGB 8.1* 7.1*  --  7.2*  HCT 26.0* 23.1*  --  23.5*  PLT 298 216  --  269  CREATININE 0.92 0.89 1.02 1.08    Estimated Creatinine Clearance: 69.7 mL/min (by C-G formula based on SCr of 1.08 mg/dL).   Medical History: Past Medical History:  Diagnosis Date  . AAA (abdominal aortic aneurysm) (HCC)   . Abnormal stress test 08/08/12  . AF (atrial fibrillation) (HCC) 2014  . Atrial fibrillation (HCC)   . Bilateral carotid artery stenosis 08/14/12   moderate bilaterally per carotid duplex  . CAD (coronary artery disease)   . Coronary artery disease   . Deformed pylorus, acquired   . Erythema of esophagus   . Esophageal stricture   . Fatty infiltration of liver   . Fatty liver   . GERD (gastroesophageal reflux disease)   . History of esophageal stricture   . Hyperlipidemia   . Parkinsonism (HCC)   . S/P cardiac cath 08/12/12  . Splenomegaly     Medications:  Prescriptions Prior to Admission  Medication Sig Dispense Refill Last Dose  . atorvastatin (LIPITOR) 10 MG tablet Take 10 mg by mouth daily.   unknown  . atorvastatin (LIPITOR) 40 MG tablet Take 40 mg by mouth daily.   Taking  . carbidopa-levodopa (SINEMET CR) 50-200 MG tablet Take 1 tablet by mouth 3 (three) times daily. Take it 9 AM, 1 PM and 5 PM. 90 tablet 5   . carbidopa-levodopa (SINEMET CR) 50-200 MG tablet Take 1 tablet by mouth 3 (three) times daily.   unknown  . Cyanocobalamin (B-12) 2500 MCG TABS Take by mouth.   Taking  . tamsulosin (FLOMAX) 0.4 MG  CAPS capsule   0 Taking  . tamsulosin (FLOMAX) 0.4 MG CAPS capsule Take 0.4 mg by mouth daily.   unknown  . Testosterone Cypionate & Prop 200-20 MG/ML SOLN Inject into the muscle.   Taking    Assessment: 76 yo M admitted 06/29/2016 with recent embolic stroke (6/961/18) secondary to R-ICA stenosis and new onset Afib. He was previously on Afib but developed bleeding with suctioning on 2/9 and heparin was held. Pharmacy has been consulted to resume heparin.   Previous heparin goal was modified to be 0.3-0.5 d/t recent liver hematoma, flank hematoma and hematuria. Will continue with this goal.  Hgb 7.2, Plt 269 stable, No current bleeding noted.  Goal of Therapy:  Heparin level 0.3-0.5 units/ml Monitor platelets by anticoagulation protocol: Yes   Plan:  - Heparin 1450 units/hr (was previously therapeutic at this rate) - Check 8 hour heparin level - Monitor daily heparin level, CBC and signs/symptoms of bleeding  Casilda Carlsaylor Addison Whidbee, PharmD, BCPS PGY-2 Infectious Diseases Pharmacy Resident Pager: [phone] 07/03/2016,11:18 AM
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
## 2016-07-03 NOTE — Progress Notes (Signed)
RT note- placed back to full support, slight increase of respirations while weaning, and moderate consistent thick tan/pink secretions.

## 2016-07-04 ENCOUNTER — Inpatient Hospital Stay (HOSPITAL_COMMUNITY): Payer: Medicare Other

## 2016-07-04 LAB — BASIC METABOLIC PANEL
Anion gap: 10 (ref 5–15)
BUN: 21 mg/dL — AB (ref 6–20)
CALCIUM: 8.3 mg/dL — AB (ref 8.9–10.3)
CO2: 31 mmol/L (ref 22–32)
Chloride: 96 mmol/L — ABNORMAL LOW (ref 101–111)
Creatinine, Ser: 0.93 mg/dL (ref 0.61–1.24)
GFR calc Af Amer: >60 mL/min (ref >60)
GLUCOSE: 126 mg/dL — AB (ref 65–99)
Potassium: 3.6 mmol/L (ref 3.5–5.1)
Sodium: 137 mmol/L (ref 135–145)

## 2016-07-04 LAB — BLOOD GAS, ARTERIAL
Acid-Base Excess: 8.2 mmol/L — ABNORMAL HIGH (ref 0.0–2.0)
Bicarbonate: 31.7 mmol/L — ABNORMAL HIGH (ref 20.0–28.0)
DRAWN BY: 249101
FIO2: 30
MECHVT: 600 mL
O2 SAT: 98.1 %
PEEP: 5 cmH2O
PH ART: 7.516 — AB (ref 7.350–7.450)
PO2 ART: 98.8 mmHg (ref 83.0–108.0)
Patient temperature: 98.6
RATE: 18 resp/min
pCO2 arterial: 39.4 mmHg (ref 32.0–48.0)

## 2016-07-04 LAB — GLUCOSE, CAPILLARY
GLUCOSE-CAPILLARY: 123 mg/dL — AB (ref 65–99)
GLUCOSE-CAPILLARY: 125 mg/dL — AB (ref 65–99)
GLUCOSE-CAPILLARY: 126 mg/dL — AB (ref 65–99)
Glucose-Capillary: 127 mg/dL — ABNORMAL HIGH (ref 65–99)
Glucose-Capillary: 114 mg/dL — ABNORMAL HIGH (ref 65–99)
Glucose-Capillary: 105 mg/dL — ABNORMAL HIGH (ref 65–99)
Glucose-Capillary: 122 mg/dL — ABNORMAL HIGH (ref 65–99)

## 2016-07-04 LAB — HEPARIN LEVEL (UNFRACTIONATED)
HEPARIN UNFRACTIONATED: 0.17 [IU]/mL — AB (ref 0.30–0.70)
HEPARIN UNFRACTIONATED: 0.35 [IU]/mL (ref 0.30–0.70)
Heparin Unfractionated: 0.22 IU/mL — ABNORMAL LOW (ref 0.30–0.70)

## 2016-07-04 LAB — PHOSPHORUS: Phosphorus: 2.5 mg/dL (ref 2.5–4.6)

## 2016-07-04 LAB — CBC
HCT: 25.6 % — ABNORMAL LOW (ref 39.0–52.0)
Hemoglobin: 7.7 g/dL — ABNORMAL LOW (ref 13.0–17.0)
MCH: 27.3 pg (ref 26.0–34.0)
MCHC: 30.1 g/dL (ref 30.0–36.0)
MCV: 90.8 fL (ref 78.0–100.0)
Platelets: 301 10*3/uL (ref 150–400)
RBC: 2.82 MIL/uL — ABNORMAL LOW (ref 4.22–5.81)
RDW: 16.8 % — AB (ref 11.5–15.5)
WBC: 8.7 10*3/uL (ref 4.0–10.5)

## 2016-07-04 LAB — CULTURE, BLOOD (ROUTINE X 2)
CULTURE: NO GROWTH
Culture: NO GROWTH

## 2016-07-04 LAB — MAGNESIUM: Magnesium: 2 mg/dL (ref 1.7–2.4)

## 2016-07-04 MED ORDER — ACETAZOLAMIDE SODIUM 500 MG IJ SOLR
250.0000 mg | Freq: Two times a day (BID) | INTRAMUSCULAR | Status: DC
  Filled 2016-07-04: qty 250

## 2016-07-04 MED ORDER — ACETAZOLAMIDE SODIUM 500 MG IJ SOLR: 250.0000 mg | Freq: Four times a day (QID) | INTRAMUSCULAR | Status: DC

## 2016-07-04 MED ORDER — POTASSIUM CHLORIDE CRYS ER 20 MEQ PO TBCR: 40.0000 meq | EXTENDED_RELEASE_TABLET | Freq: Two times a day (BID) | ORAL | Status: DC

## 2016-07-04 MED ORDER — ACETAZOLAMIDE SODIUM 500 MG IJ SOLR
250.0000 mg | Freq: Four times a day (QID) | INTRAMUSCULAR | Status: AC
  Administered 2016-07-04 (×3): 250 mg via INTRAVENOUS
  Filled 2016-07-04 (×3): qty 250

## 2016-07-04 MED ORDER — FUROSEMIDE 10 MG/ML IJ SOLN
40.0000 mg | Freq: Four times a day (QID) | INTRAMUSCULAR | Status: AC
  Administered 2016-07-04 (×2): 40 mg via INTRAVENOUS
  Filled 2016-07-04 (×2): qty 4

## 2016-07-04 MED ORDER — SODIUM CHLORIDE 0.9% FLUSH
10.0000 mL | Freq: Two times a day (BID) | INTRAVENOUS | Status: DC
  Administered 2016-07-04 – 2016-07-08 (×8): 10 mL
  Administered 2016-07-08: 20 mL
  Administered 2016-07-09 – 2016-07-11 (×5): 10 mL

## 2016-07-04 MED ORDER — CHLORHEXIDINE GLUCONATE CLOTH 2 % EX PADS
6.0000 | MEDICATED_PAD | Freq: Every day | CUTANEOUS | Status: DC
  Administered 2016-07-04 – 2016-07-11 (×7): 6 via TOPICAL

## 2016-07-04 MED ORDER — SODIUM CHLORIDE 0.9% FLUSH: 10.0000 mL | INTRAVENOUS | Status: DC | PRN

## 2016-07-04 MED ORDER — POTASSIUM CHLORIDE 20 MEQ/15ML (10%) PO SOLN
40.0000 meq | Freq: Three times a day (TID) | ORAL | Status: AC
  Administered 2016-07-04 (×2): 40 meq
  Filled 2016-07-04 (×2): qty 30
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
## 2016-07-04 NOTE — Progress Notes (Signed)
ANTICOAGULATION CONSULT NOTE - Follow Up Consult  Pharmacy Consult for Heparin  Indication: atrial fibrillation and stroke  No Known Allergies  Patient Measurements: Weight: 209 lb 7 oz (95 kg)  Vital Signs: Temp: 98.9 F (37.2 C) (02/14 0412) Temp Source: Oral (02/14 0412) BP: 96/64 (02/14 0400) Pulse Rate: 69 (02/14 0400)  Labs:  Recent Labs  07/02/16 0430 07/02/16 1659 07/03/16 0400 07/03/16 2008 07/04/16 0419  HGB 7.1*  --  7.2*  --  7.7*  HCT 23.1*  --  23.5*  --  25.6*  PLT 216  --  269  --  301  HEPARINUNFRC  --   --   --  0.14* 0.17*  CREATININE 0.89 1.02 1.08  --   --     Estimated Creatinine Clearance: 68.5 mL/min (by C-G formula based on SCr of 1.08 mg/dL).   Assessment: Sub-therapeutic heparin level despite rate increase, no issues per RN.   Goal of Therapy:  Heparin level 0.3-0.5 units/ml Monitor platelets by anticoagulation protocol: Yes   Plan:  -Inc heparin to 1850 units/hr -1300 HL  Abran DukeLedford, Maddi Collar 07/04/2016,4:51 AM

## 2016-07-04 NOTE — Progress Notes (Signed)
ANTICOAGULATION CONSULT NOTE  Pharmacy Consult for heparin Indication: atrial fibrillation/CVA  No Known Allergies  Patient Measurements: Weight: 209 lb 7 oz (95 kg)  Ideal body weight: 75 kg Heparin Dosing Weight: 95 kg  Vital Signs: Temp: 98.3 F (36.8 C) (02/14 1930) Temp Source: Oral (02/14 1930) BP: 90/56 (02/14 2100) Pulse Rate: 65 (02/14 2100)  Labs:  Recent Labs  07/02/16 0430 07/02/16 1659 07/03/16 0400  07/04/16 0419 07/04/16 1312 07/04/16 2200  HGB 7.1*  --  7.2*  --  7.7*  --   --   HCT 23.1*  --  23.5*  --  25.6*  --   --   PLT 216  --  269  --  301  --   --   HEPARINUNFRC  --   --   --   < > 0.17* 0.22* 0.35  CREATININE 0.89 1.02 1.08  --  0.93  --   --   < > = values in this interval not displayed.  Estimated Creatinine Clearance: 79.5 mL/min (by C-G formula based on SCr of 0.93 mg/dL).   Medical History: Past Medical History:  Diagnosis Date  . AAA (abdominal aortic aneurysm) (HCC)   . Abnormal stress test 08/08/12  . AF (atrial fibrillation) (HCC) 2014  . Atrial fibrillation (HCC)   . Bilateral carotid artery stenosis 08/14/12   moderate bilaterally per carotid duplex  . CAD (coronary artery disease)   . Coronary artery disease   . Deformed pylorus, acquired   . Erythema of esophagus   . Esophageal stricture   . Fatty infiltration of liver   . Fatty liver   . GERD (gastroesophageal reflux disease)   . History of esophageal stricture   . Hyperlipidemia   . Parkinsonism (HCC)   . S/P cardiac cath 08/12/12  . Splenomegaly     Medications:  Prescriptions Prior to Admission  Medication Sig Dispense Refill Last Dose  . atorvastatin (LIPITOR) 10 MG tablet Take 10 mg by mouth daily.   unknown  . atorvastatin (LIPITOR) 40 MG tablet Take 40 mg by mouth daily.   Taking  . carbidopa-levodopa (SINEMET CR) 50-200 MG tablet Take 1 tablet by mouth 3 (three) times daily. Take it 9 AM, 1 PM and 5 PM. 90 tablet 5   . carbidopa-levodopa (SINEMET CR)  50-200 MG tablet Take 1 tablet by mouth 3 (three) times daily.   unknown  . Cyanocobalamin (B-12) 2500 MCG TABS Take by mouth.   Taking  . tamsulosin (FLOMAX) 0.4 MG CAPS capsule   0 Taking  . tamsulosin (FLOMAX) 0.4 MG CAPS capsule Take 0.4 mg by mouth daily.   unknown  . Testosterone Cypionate & Prop 200-20 MG/ML SOLN Inject into the muscle.   Taking    Assessment: 76 yo M admitted 06/29/2016 with recent embolic stroke (4/091/18) secondary to R-ICA stenosis and new onset Afib. He was previously on Afib but developed bleeding with suctioning on 2/9 and heparin was held. Pharmacy has been consulted to resume heparin on 2/13.   Previous heparin goal was modified to be 0.3-0.5 d/t recent liver hematoma, flank hematoma and hematuria. Will continue with this goal.  Heparin level now therapeutic at 0.35 on heparin 2050 units/hr. No issues with infusion or bleeding noted.  Goal of Therapy:  Heparin level 0.3-0.5 units/ml Monitor platelets by anticoagulation protocol: Yes   Plan:  Continue heparin 2050 units/hr Monitor daily heparin level, CBC and signs/symptoms of bleeding  Arlean Hoppingorey M. Newman PiesBall, PharmD, BCPS Clinical Pharmacist [phone] 07/04/2016,10:34  PM
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
## 2016-07-05 ENCOUNTER — Encounter (HOSPITAL_COMMUNITY): Payer: Self-pay

## 2016-07-05 ENCOUNTER — Inpatient Hospital Stay (HOSPITAL_COMMUNITY): Payer: Medicare Other

## 2016-07-05 DIAGNOSIS — J690 Pneumonitis due to inhalation of food and vomit: Secondary | ICD-10-CM

## 2016-07-05 LAB — GLUCOSE, CAPILLARY
GLUCOSE-CAPILLARY: 126 mg/dL — AB (ref 65–99)
Glucose-Capillary: 103 mg/dL — ABNORMAL HIGH (ref 65–99)
Glucose-Capillary: 109 mg/dL — ABNORMAL HIGH (ref 65–99)
Glucose-Capillary: 113 mg/dL — ABNORMAL HIGH (ref 65–99)
Glucose-Capillary: 114 mg/dL — ABNORMAL HIGH (ref 65–99)
Glucose-Capillary: 120 mg/dL — ABNORMAL HIGH (ref 65–99)

## 2016-07-05 LAB — BLOOD GAS, ARTERIAL
ACID-BASE EXCESS: 1.9 mmol/L (ref 0.0–2.0)
Bicarbonate: 25.9 mmol/L (ref 20.0–28.0)
DRAWN BY: 249101
FIO2: 30
MECHVT: 600 mL
O2 SAT: 98.6 %
PEEP/CPAP: 5 cmH2O
PH ART: 7.427 (ref 7.350–7.450)
Patient temperature: 98.6
RATE: 18 resp/min
pCO2 arterial: 39.9 mmHg (ref 32.0–48.0)
pO2, Arterial: 120 mmHg — ABNORMAL HIGH (ref 83.0–108.0)

## 2016-07-05 LAB — PHOSPHORUS: Phosphorus: 2.9 mg/dL (ref 2.5–4.6)

## 2016-07-05 LAB — BASIC METABOLIC PANEL
ANION GAP: 9 (ref 5–15)
BUN: 25 mg/dL — ABNORMAL HIGH (ref 6–20)
CALCIUM: 8.8 mg/dL — AB (ref 8.9–10.3)
CO2: 26 mmol/L (ref 22–32)
Chloride: 103 mmol/L (ref 101–111)
Creatinine, Ser: 0.94 mg/dL (ref 0.61–1.24)
GLUCOSE: 129 mg/dL — AB (ref 65–99)
Potassium: 4 mmol/L (ref 3.5–5.1)
Sodium: 138 mmol/L (ref 135–145)

## 2016-07-05 LAB — CBC
HCT: 27.3 % — ABNORMAL LOW (ref 39.0–52.0)
Hemoglobin: 8.2 g/dL — ABNORMAL LOW (ref 13.0–17.0)
MCH: 27.6 pg (ref 26.0–34.0)
MCHC: 30 g/dL (ref 30.0–36.0)
MCV: 91.9 fL (ref 78.0–100.0)
PLATELETS: 326 10*3/uL (ref 150–400)
RBC: 2.97 MIL/uL — ABNORMAL LOW (ref 4.22–5.81)
RDW: 17.5 % — AB (ref 11.5–15.5)
WBC: 7.8 10*3/uL (ref 4.0–10.5)

## 2016-07-05 LAB — MAGNESIUM: Magnesium: 2.3 mg/dL (ref 1.7–2.4)

## 2016-07-05 LAB — HEPARIN LEVEL (UNFRACTIONATED): Heparin Unfractionated: 0.44 IU/mL (ref 0.30–0.70)

## 2016-07-05 MED ORDER — FUROSEMIDE 10 MG/ML IJ SOLN
40.0000 mg | Freq: Four times a day (QID) | INTRAMUSCULAR | Status: AC
  Administered 2016-07-05 (×3): 40 mg via INTRAVENOUS
  Filled 2016-07-05 (×3): qty 4

## 2016-07-05 MED ORDER — POTASSIUM CHLORIDE 20 MEQ/15ML (10%) PO SOLN
40.0000 meq | Freq: Two times a day (BID) | ORAL | Status: AC
  Administered 2016-07-05 (×2): 40 meq
  Filled 2016-07-05 (×2): qty 30

## 2016-07-05 MED ORDER — SPIRONOLACTONE NICU ORAL SYRINGE 5 MG/ML
25.0000 mg | ORAL | Status: DC
  Administered 2016-07-05 – 2016-07-07 (×3): 25 mg via ORAL
  Filled 2016-07-05 (×3): qty 5

## 2016-07-05 MED ORDER — TORSEMIDE 20 MG PO TABS
20.0000 mg | ORAL_TABLET | Freq: Two times a day (BID) | ORAL | Status: DC
  Administered 2016-07-05 – 2016-07-06 (×3): 20 mg
  Filled 2016-07-05 (×4): qty 1

## 2016-07-05 MED ORDER — ACETAZOLAMIDE SODIUM 500 MG IJ SOLR
250.0000 mg | Freq: Three times a day (TID) | INTRAMUSCULAR | Status: AC
  Administered 2016-07-05 (×2): 250 mg via INTRAVENOUS
  Filled 2016-07-05 (×2): qty 250
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
## 2016-07-05 NOTE — Progress Notes (Signed)
Subjective:  Patient on the ventilator and in no acute distress. Is weaning but slowly. Family contacted me to evaluate. I have Reviewed his chart, I have been following him peripherally.  Objective:  Vital Signs in the last 24 hours: Temp:  [97.4 F (36.3 C)-99.3 F (37.4 C)] 99.3 F (37.4 C) (02/15 1239) Pulse Rate:  [53-80] 69 (02/15 1202) Resp:  [17-27] 24 (02/15 1202) BP: (88-126)/(43-76) 99/58 (02/15 1202) SpO2:  [96 %-100 %] 97 % (02/15 1202) FiO2 (%):  [30 %] 30 % (02/15 1202) Weight:  [204 lb 2.3 oz (92.6 kg)] 204 lb 2.3 oz (92.6 kg) (02/15 0307)  Intake/Output from previous day: 02/14 0701 - 02/15 0700 In: 2698.2 [I.V.:828.2; NG/GT:1710; IV Piggyback:150] Out: 4176 [Urine:3875; Stool:1]  Physical Exam: Blood pressure 131/60, pulse 70, temperature 98.2 F (36.8 C), temperature source Oral, resp. rate 18, height 5' 11" (1.803 m), weight 212 lb 4.9 oz (96.3 kg), SpO2 100 %. Body mass index is 28.47 kg/m.  General appearance: cooperative, appears stated age, no distress, slowed mentation and opens eyes on command and moves head appropriately and follows commands to move all 4 extremities Eyes: conjunctivae/corneas clear. PERRL, EOM's intact. Fundi benign. Neck: Short nect and ET tube makes evaluation difficult. Neck: JVP - normal, carotids 2+= without bruits Resp: clear to auscultation bilaterally Chest wall: Limited exam due to presence of brace Cardio: regular rate and rhythm, S1, S2 normal, no murmur, click, rub or gallop GI: soft, non-tender; bowel sounds normal; no masses,  no organomegaly Extremities: mild leg edema, 2+ bilateral upper extremity edema, no tenderness or signs of inflammation.  Lab Results: BMP  Recent Labs  07/03/16 0400 07/04/16 0419 07/05/16 0500  NA 140 137 138  K 3.5 3.6 4.0  CL 101 96* 103  CO2 28 31 26  GLUCOSE 126* 126* 129*  BUN 19 21* 25*  CREATININE 1.08 0.93 0.94  CALCIUM 8.3* 8.3* 8.8*  GFRNONAA >60 >60 >60  GFRAA >60 >60 >60    CBC Latest Ref Rng & Units 07/05/2016 07/04/2016 07/03/2016  WBC 4.0 - 10.5 K/uL 7.8 8.7 7.7  Hemoglobin 13.0 - 17.0 g/dL 8.2(L) 7.7(L) 7.2(L)  Hematocrit 39.0 - 52.0 % 27.3(L) 25.6(L) 23.5(L)  Platelets 150 - 400 K/uL 326 301 269   HEMOGLOBIN A1C Lab Results  Component Value Date   HGBA1C 4.8 06/08/2016   MPG 91 06/08/2016    Cardiac Panel (last 3 results)  Recent Labs  06/18/16 0919 06/18/16 1435 06/18/16 2031  TROPONINI <0.03 0.03* 0.03*    Recent Labs  06/17/16 1429  TSH 6.936*  Hepatic Function Panel  Recent Labs  06/04/16 0338 06/19/16 0238  06/21/16 0507  06/27/16 0832 06/28/16 0456 06/29/16 0557  PROT 5.9* 7.2  --  6.6  --   --   --   --   ALBUMIN 2.9* 3.0*  < > 2.9*  < > 2.6* 2.6* 2.6*  AST 48* 22  --  23  --   --   --   --   ALT 7* 8*  --  13*  --   --   --   --   ALKPHOS 52 145*  --  128*  --   --   --   --   BILITOT 1.5* 1.3*  --  0.9  --   --   --   --   < > = values in this interval not displayed.  Imaging: Imaging results have been reviewed  Cardiac Studies:  2D Echocardiogram /  19/2018: - Left ventricle: The cavity size was normal. There was severeconcentric hypertrophy. Systolic function was vigorous. Theestimated ejection fraction was in the range of 65% to 70%. Wallmotion was normal; there were no regional wall motionabnormalities. Features are consistent with a pseudonormal leftventricular filling pattern, with concomitant abnormal relaxationand increased filling pressure (grade 2 diastolic dysfunction). - Left atrium: The atrium was mildly dilated. - Tricuspid valve: There was mild regurgitation. - Pulmonary arteries: Systolic pressure was mildly increased. PApeak pressure: 39 mm Hg (S). Impressions: No cardiac source of emboli was indentified.  EKG 06/11/2016:Atrial fibrillation with controlled ventricular response at the rate of 81 bpm, left axis deviation, no evidence of ischemia. Normal QT interval. 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  Telemetry: Review of the telemetry strips were the past 48-72 hours reveals no recurrence of atrial fibrillation.  Patient has sinus rhythm with frequent PACs.  Occasional episodes of sinus arrest, maximum pause 2.2 seconds.  Scheduled Meds: . acetaZOLAMIDE  250 mg Intravenous Q8H  . carbidopa-levodopa  1.5 tablet Per Tube QID  .  ceFAZolin (ANCEF) IV  1 g Intravenous Q8H  . chlorhexidine gluconate (MEDLINE KIT)  15 mL Mouth Rinse BID  . Chlorhexidine Gluconate Cloth  6 each Topical q morning - 10a  . Chlorhexidine Gluconate Cloth  6 each Topical Daily  . furosemide  40 mg Intravenous Q6H  . mouth rinse  15 mL Mouth Rinse QID  . pantoprazole sodium  40 mg Per Tube BID  . potassium chloride  40 mEq Per Tube BID  . sodium chloride flush  10-40 mL Intracatheter Q12H   Continuous Infusions: . sodium chloride 10 mL/hr at 07/05/16 0600  . feeding supplement (VITAL AF 1.2 CAL) 1,000 mL (07/05/16 0600)  . fentaNYL infusion INTRAVENOUS 50 mcg/hr (07/05/16 1246)  . heparin 2,050 Units/hr (07/05/16 0647)   PRN Meds:.acetaminophen (TYLENOL) oral liquid 160 mg/5 mL, fentaNYL, midazolam, sodium chloride flush   Assessment/Plan:  1. Paroxysmal atrial fibrillation,  back to sinus rhythm presently on IV heparin, no recurrence of hematurea. CHA2DS2-VASCScore: Risk Score 6, Yearly risk of stroke 9.8. Recommendation: Anticoagulation. 2. Obstructive uropathy and acute renal failure resolved. 3. 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 4. Acute on Chronic diastolic heart failure, 5. Hyperlipidemia 6. Symptomatic right carotid artery stenosis, high-grade needs endarterectomy. 7. Parkinson's disease  Recommendation: Patient is presently fluided overloaded and  also severe malnutrition due to low albumin and third spacing fluid.   Consider increasing protein in tube feeds. Would change to demadex for aggressive diuresis which may help with acute on chronic diastolic heart failure. I have updated his status with family.  Will also add spironolactone and f/u BMP in morning. If he responds well to oral Demadex with diuresis,I will discontinue IV furosemide.  , , M.D. 07/05/2016, 12:58 PM [facility] Pager: [phone] Office: [phone] If no answer: [phone]  

## 2016-07-05 NOTE — Progress Notes (Signed)
Foley bag leaking out urine. Orders from Dr. Molli KnockYacoub to exchange foley bag. Will exchange.

## 2016-07-05 NOTE — Progress Notes (Signed)
eLink Physician-Brief Progress Note Patient Name: Emelia LoronRichard Kingdon DOB: 05-25-1940 MRN: [id number]   Date of Service  07/05/2016  HPI/Events of Note  Request to renew restraint order.   eICU Interventions  Will renew restraint order.      Intervention Category Minor Interventions: Agitation / anxiety - evaluation and management  Shanai Lartigue Eugene 07/05/2016, 3:45 PM

## 2016-07-05 NOTE — Progress Notes (Signed)
ANTICOAGULATION CONSULT NOTE - Initial Consult  Pharmacy Consult for heparin Indication: atrial fibrillation/CVA  No Known Allergies  Patient Measurements: Weight: 204 lb 2.3 oz (92.6 kg)  Ideal body weight: 75 kg Heparin Dosing Weight: 95 kg  Vital Signs: Temp: 97.7 F (36.5 C) (02/15 0307) Temp Source: Oral (02/15 0307) BP: 117/43 (02/15 0500) Pulse Rate: 64 (02/15 0500)  Labs:  Recent Labs  07/03/16 0400  07/04/16 0419 07/04/16 1312 07/04/16 2200 07/05/16 0500  HGB 7.2*  --  7.7*  --   --  8.2*  HCT 23.5*  --  25.6*  --   --  27.3*  PLT 269  --  301  --   --  326  HEPARINUNFRC  --   < > 0.17* 0.22* 0.35 0.44  CREATININE 1.08  --  0.93  --   --  0.94  < > = values in this interval not displayed.  Estimated Creatinine Clearance: 77.7 mL/min (by C-G formula based on SCr of 0.94 mg/dL).   Medical History: Past Medical History:  Diagnosis Date  . AAA (abdominal aortic aneurysm) (HCC)   . Abnormal stress test 08/08/12  . AF (atrial fibrillation) (HCC) 2014  . Atrial fibrillation (HCC)   . Bilateral carotid artery stenosis 08/14/12   moderate bilaterally per carotid duplex  . CAD (coronary artery disease)   . Coronary artery disease   . Deformed pylorus, acquired   . Erythema of esophagus   . Esophageal stricture   . Fatty infiltration of liver   . Fatty liver   . GERD (gastroesophageal reflux disease)   . History of esophageal stricture   . Hyperlipidemia   . Parkinsonism (HCC)   . S/P cardiac cath 08/12/12  . Splenomegaly     Medications:  Prescriptions Prior to Admission  Medication Sig Dispense Refill Last Dose  . atorvastatin (LIPITOR) 10 MG tablet Take 10 mg by mouth daily.   unknown  . atorvastatin (LIPITOR) 40 MG tablet Take 40 mg by mouth daily.   Taking  . carbidopa-levodopa (SINEMET CR) 50-200 MG tablet Take 1 tablet by mouth 3 (three) times daily. Take it 9 AM, 1 PM and 5 PM. 90 tablet 5   . carbidopa-levodopa (SINEMET CR) 50-200 MG tablet Take  1 tablet by mouth 3 (three) times daily.   unknown  . Cyanocobalamin (B-12) 2500 MCG TABS Take by mouth.   Taking  . tamsulosin (FLOMAX) 0.4 MG CAPS capsule   0 Taking  . tamsulosin (FLOMAX) 0.4 MG CAPS capsule Take 0.4 mg by mouth daily.   unknown  . Testosterone Cypionate & Prop 200-20 MG/ML SOLN Inject into the muscle.   Taking    Assessment: 76 yo M admitted 06/29/2016 with recent embolic stroke (1/611/18) secondary to R-ICA stenosis and new onset Afib. He was previously on heparin but developed bleeding with suctioning on 2/9 and heparin was held. Pharmacy was consulted to resume heparin on 2/13.   Previous heparin goal was modified to be 0.3-0.5 d/t recent liver hematoma, flank hematoma and hematuria. Will continue with this goal.  Heparin level 0.44 (therapeutic), Hgb 8.2, Plt 326 stable, No current bleeding noted.  Goal of Therapy:  Heparin level 0.3-0.5 units/ml Monitor platelets by anticoagulation protocol: Yes   Plan:  - Continue heparin 2050 units/hr - Monitor daily heparin level, CBC and signs/symptoms of bleeding  Casilda Carlsaylor Neylan Koroma, PharmD, BCPS PGY-2 Infectious Diseases Pharmacy Resident Pager: [phone] 07/05/2016,7:24 AM
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
## 2016-07-06 ENCOUNTER — Inpatient Hospital Stay (HOSPITAL_COMMUNITY): Payer: Medicare Other

## 2016-07-06 DIAGNOSIS — J15211 Pneumonia due to Methicillin susceptible Staphylococcus aureus: Secondary | ICD-10-CM

## 2016-07-06 DIAGNOSIS — I480 Paroxysmal atrial fibrillation: Secondary | ICD-10-CM

## 2016-07-06 DIAGNOSIS — J9601 Acute respiratory failure with hypoxia: Secondary | ICD-10-CM

## 2016-07-06 LAB — GLUCOSE, CAPILLARY
GLUCOSE-CAPILLARY: 101 mg/dL — AB (ref 65–99)
GLUCOSE-CAPILLARY: 115 mg/dL — AB (ref 65–99)
GLUCOSE-CAPILLARY: 118 mg/dL — AB (ref 65–99)
GLUCOSE-CAPILLARY: 119 mg/dL — AB (ref 65–99)
GLUCOSE-CAPILLARY: 123 mg/dL — AB (ref 65–99)
Glucose-Capillary: 106 mg/dL — ABNORMAL HIGH (ref 65–99)

## 2016-07-06 LAB — BLOOD GAS, ARTERIAL
Acid-Base Excess: 3.5 mmol/L — ABNORMAL HIGH (ref 0.0–2.0)
BICARBONATE: 27.5 mmol/L (ref 20.0–28.0)
DRAWN BY: 418751
FIO2: 30
LHR: 18 {breaths}/min
O2 SAT: 98.2 %
PEEP/CPAP: 5 cmH2O
PH ART: 7.438 (ref 7.350–7.450)
Patient temperature: 98.6
VT: 600 mL
pCO2 arterial: 41.4 mmHg (ref 32.0–48.0)
pO2, Arterial: 113 mmHg — ABNORMAL HIGH (ref 83.0–108.0)

## 2016-07-06 LAB — HEPARIN LEVEL (UNFRACTIONATED)
HEPARIN UNFRACTIONATED: 0.52 [IU]/mL (ref 0.30–0.70)
HEPARIN UNFRACTIONATED: 0.48 [IU]/mL (ref 0.30–0.70)

## 2016-07-06 LAB — CBC
HEMATOCRIT: 29.5 % — AB (ref 39.0–52.0)
Hemoglobin: 8.8 g/dL — ABNORMAL LOW (ref 13.0–17.0)
MCH: 26.9 pg (ref 26.0–34.0)
MCHC: 29.8 g/dL — AB (ref 30.0–36.0)
MCV: 90.2 fL (ref 78.0–100.0)
PLATELETS: 378 10*3/uL (ref 150–400)
RBC: 3.27 MIL/uL — ABNORMAL LOW (ref 4.22–5.81)
RDW: 17.1 % — AB (ref 11.5–15.5)
WBC: 7.7 10*3/uL (ref 4.0–10.5)

## 2016-07-06 LAB — BASIC METABOLIC PANEL
Anion gap: 10 (ref 5–15)
BUN: 30 mg/dL — ABNORMAL HIGH (ref 6–20)
CO2: 26 mmol/L (ref 22–32)
Calcium: 8.9 mg/dL (ref 8.9–10.3)
Chloride: 100 mmol/L — ABNORMAL LOW (ref 101–111)
Creatinine, Ser: 1.05 mg/dL (ref 0.61–1.24)
GFR calc Af Amer: >60 mL/min (ref >60)
GLUCOSE: 106 mg/dL — AB (ref 65–99)
POTASSIUM: 4.4 mmol/L (ref 3.5–5.1)
Sodium: 136 mmol/L (ref 135–145)

## 2016-07-06 LAB — PHOSPHORUS: PHOSPHORUS: 3.8 mg/dL (ref 2.5–4.6)

## 2016-07-06 LAB — MAGNESIUM: Magnesium: 2.6 mg/dL — ABNORMAL HIGH (ref 1.7–2.4)
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
## 2016-07-06 NOTE — Progress Notes (Signed)
ANTICOAGULATION CONSULT NOTE - Initial Consult  Pharmacy Consult for heparin Indication: atrial fibrillation/CVA  No Known Allergies  Patient Measurements: Weight: 203 lb 0.7 oz (92.1 kg)  Ideal body weight: 75 kg Heparin Dosing Weight: 95 kg  Vital Signs: Temp: 97.6 F (36.4 C) (02/16 0738) Temp Source: Oral (02/16 0738) BP: 105/64 (02/16 0700) Pulse Rate: 63 (02/16 0700)  Labs:  Recent Labs  07/04/16 0419  07/04/16 2200 07/05/16 0500 07/06/16 0500  HGB 7.7*  --   --  8.2* 8.8*  HCT 25.6*  --   --  27.3* 29.5*  PLT 301  --   --  326 378  HEPARINUNFRC 0.17*  < > 0.35 0.44 0.52  CREATININE 0.93  --   --  0.94 1.05  < > = values in this interval not displayed.  Estimated Creatinine Clearance: 69.4 mL/min (by C-G formula based on SCr of 1.05 mg/dL).   Medical History: Past Medical History:  Diagnosis Date  . AAA (abdominal aortic aneurysm) (HCC)   . Abnormal stress test 08/08/12  . AF (atrial fibrillation) (HCC) 2014  . Atrial fibrillation (HCC)   . Bilateral carotid artery stenosis 08/14/12   moderate bilaterally per carotid duplex  . CAD (coronary artery disease)   . Coronary artery disease   . Deformed pylorus, acquired   . Erythema of esophagus   . Esophageal stricture   . Fatty infiltration of liver   . Fatty liver   . GERD (gastroesophageal reflux disease)   . History of esophageal stricture   . Hyperlipidemia   . Parkinsonism (HCC)   . S/P cardiac cath 08/12/12  . Splenomegaly     Medications:  Prescriptions Prior to Admission  Medication Sig Dispense Refill Last Dose  . atorvastatin (LIPITOR) 10 MG tablet Take 10 mg by mouth daily.   unknown  . atorvastatin (LIPITOR) 40 MG tablet Take 40 mg by mouth daily.   Taking  . carbidopa-levodopa (SINEMET CR) 50-200 MG tablet Take 1 tablet by mouth 3 (three) times daily. Take it 9 AM, 1 PM and 5 PM. 90 tablet 5   . carbidopa-levodopa (SINEMET CR) 50-200 MG tablet Take 1 tablet by mouth 3 (three) times daily.    unknown  . Cyanocobalamin (B-12) 2500 MCG TABS Take by mouth.   Taking  . tamsulosin (FLOMAX) 0.4 MG CAPS capsule   0 Taking  . tamsulosin (FLOMAX) 0.4 MG CAPS capsule Take 0.4 mg by mouth daily.   unknown  . Testosterone Cypionate & Prop 200-20 MG/ML SOLN Inject into the muscle.   Taking    Assessment: 76 yo M admitted 06/29/2016 with recent embolic stroke (6/291/18) secondary to R-ICA stenosis and new onset Afib. He was previously on heparin but developed bleeding with suctioning on 2/9 and heparin was held. Pharmacy was consulted to resume heparin on 2/13.   Previous heparin goal was modified to be 0.3-0.5 d/t recent liver hematoma, flank hematoma and hematuria. Will continue with this goal.  Heparin level 0.52 (slightly supratherapeutic), Hgb 8.8 stable, Plt 378 stable, No current bleeding noted.  Goal of Therapy:  Heparin level 0.3-0.5 units/ml Monitor platelets by anticoagulation protocol: Yes   Plan:  - Decrease heparin to 1900 units/hr - Monitor 8 hr heparin level - Monitor daily heparin level, CBC and signs/symptoms of bleeding  Casilda Carlsaylor Cayson Kalb, PharmD, BCPS PGY-2 Infectious Diseases Pharmacy Resident Pager: [phone] 07/06/2016,7:45 AM
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
## 2016-07-07 DIAGNOSIS — I5031 Acute diastolic (congestive) heart failure: Secondary | ICD-10-CM

## 2016-07-07 LAB — RENAL FUNCTION PANEL
Albumin: 2.5 g/dL — ABNORMAL LOW (ref 3.5–5.0)
Anion gap: 12 (ref 5–15)
BUN: 36 mg/dL — ABNORMAL HIGH (ref 6–20)
CALCIUM: 9.5 mg/dL (ref 8.9–10.3)
CHLORIDE: 101 mmol/L (ref 101–111)
CO2: 26 mmol/L (ref 22–32)
CREATININE: 1.22 mg/dL (ref 0.61–1.24)
GFR calc non Af Amer: 56 mL/min — ABNORMAL LOW (ref >60)
GLUCOSE: 115 mg/dL — AB (ref 65–99)
Phosphorus: 4.3 mg/dL (ref 2.5–4.6)
Potassium: 5.1 mmol/L (ref 3.5–5.1)
SODIUM: 139 mmol/L (ref 135–145)

## 2016-07-07 LAB — GLUCOSE, CAPILLARY
GLUCOSE-CAPILLARY: 108 mg/dL — AB (ref 65–99)
GLUCOSE-CAPILLARY: 113 mg/dL — AB (ref 65–99)
Glucose-Capillary: 110 mg/dL — ABNORMAL HIGH (ref 65–99)
Glucose-Capillary: 98 mg/dL (ref 65–99)
Glucose-Capillary: 110 mg/dL — ABNORMAL HIGH (ref 65–99)

## 2016-07-07 LAB — CBC
HCT: 30.6 % — ABNORMAL LOW (ref 39.0–52.0)
Hemoglobin: 9.6 g/dL — ABNORMAL LOW (ref 13.0–17.0)
MCH: 28.4 pg (ref 26.0–34.0)
MCHC: 31.4 g/dL (ref 30.0–36.0)
MCV: 90.5 fL (ref 78.0–100.0)
PLATELETS: 412 10*3/uL — AB (ref 150–400)
RBC: 3.38 MIL/uL — ABNORMAL LOW (ref 4.22–5.81)
RDW: 17.6 % — ABNORMAL HIGH (ref 11.5–15.5)
WBC: 12.5 10*3/uL — ABNORMAL HIGH (ref 4.0–10.5)

## 2016-07-07 LAB — HEPARIN LEVEL (UNFRACTIONATED)
Heparin Unfractionated: >2.2 IU/mL — ABNORMAL HIGH (ref 0.30–0.70)
Heparin Unfractionated: 0.56 IU/mL (ref 0.30–0.70)

## 2016-07-07 LAB — MAGNESIUM: MAGNESIUM: 2.7 mg/dL — AB (ref 1.7–2.4)

## 2016-07-07 MED ORDER — GUAIFENESIN 100 MG/5ML PO SOLN
20.0000 mL | Freq: Three times a day (TID) | ORAL | Status: DC
  Administered 2016-07-07 – 2016-07-16 (×27): 400 mg
  Filled 2016-07-07 (×29): qty 20

## 2016-07-07 MED ORDER — SPIRONOLACTONE 5 MG/ML ORAL SUSPENSION
25.0000 mg | Freq: Every day | ORAL | Status: DC
  Filled 2016-07-07: qty 5

## 2016-07-07 MED ORDER — TORSEMIDE 20 MG PO TABS
40.0000 mg | ORAL_TABLET | Freq: Two times a day (BID) | ORAL | Status: DC
  Administered 2016-07-07: 40 mg
  Filled 2016-07-07: qty 2

## 2016-07-07 MED ORDER — TORSEMIDE 20 MG PO TABS
20.0000 mg | ORAL_TABLET | Freq: Two times a day (BID) | ORAL | Status: DC
Start: 2016-07-07 — End: 2016-07-09
  Administered 2016-07-07 – 2016-07-09 (×4): 20 mg
  Filled 2016-07-07 (×4): qty 1
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
## 2016-07-07 NOTE — Progress Notes (Signed)
Pt is back on FS at this time tolerating it well.  
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
## 2016-07-07 NOTE — Progress Notes (Signed)
I have increased the Demadex to 40 mg BID to see if we could increase his UOP. Still fairly positive with fluid balance.    Yates DecampGANJI, Dalton Molesworth, MD 07/07/2016, 7:09 AM [facility] Pager: [phone] Office: [phone] If no answer: Cell:  [phone]

## 2016-07-08 ENCOUNTER — Inpatient Hospital Stay (HOSPITAL_COMMUNITY): Payer: Medicare Other

## 2016-07-08 LAB — GLUCOSE, CAPILLARY
GLUCOSE-CAPILLARY: 116 mg/dL — AB (ref 65–99)
GLUCOSE-CAPILLARY: 132 mg/dL — AB (ref 65–99)
Glucose-Capillary: 100 mg/dL — ABNORMAL HIGH (ref 65–99)
Glucose-Capillary: 119 mg/dL — ABNORMAL HIGH (ref 65–99)
Glucose-Capillary: 121 mg/dL — ABNORMAL HIGH (ref 65–99)
Glucose-Capillary: 127 mg/dL — ABNORMAL HIGH (ref 65–99)
Glucose-Capillary: 131 mg/dL — ABNORMAL HIGH (ref 65–99)

## 2016-07-08 LAB — RENAL FUNCTION PANEL
ALBUMIN: 2.5 g/dL — AB (ref 3.5–5.0)
Anion gap: 11 (ref 5–15)
BUN: 41 mg/dL — AB (ref 6–20)
CO2: 26 mmol/L (ref 22–32)
CREATININE: 1.16 mg/dL (ref 0.61–1.24)
Calcium: 8.9 mg/dL (ref 8.9–10.3)
Chloride: 103 mmol/L (ref 101–111)
GFR calc Af Amer: >60 mL/min (ref >60)
GFR, EST NON AFRICAN AMERICAN: 59 mL/min — AB (ref >60)
GLUCOSE: 120 mg/dL — AB (ref 65–99)
PHOSPHORUS: 3.7 mg/dL (ref 2.5–4.6)
Potassium: 4.1 mmol/L (ref 3.5–5.1)
Sodium: 140 mmol/L (ref 135–145)

## 2016-07-08 LAB — RAPID URINE DRUG SCREEN, HOSP PERFORMED
AMPHETAMINES: NOT DETECTED
Barbiturates: NOT DETECTED
Benzodiazepines: NOT DETECTED
COCAINE: NOT DETECTED
OPIATES: NOT DETECTED
TETRAHYDROCANNABINOL: NOT DETECTED

## 2016-07-08 LAB — HEPARIN LEVEL (UNFRACTIONATED): HEPARIN UNFRACTIONATED: 0.5 [IU]/mL (ref 0.30–0.70)

## 2016-07-08 MED ORDER — CEFAZOLIN IN D5W 1 GM/50ML IV SOLN
1.0000 g | Freq: Three times a day (TID) | INTRAVENOUS | Status: DC
  Administered 2016-07-08 – 2016-07-11 (×9): 1 g via INTRAVENOUS
  Filled 2016-07-08 (×11): qty 50

## 2016-07-08 MED ORDER — MIDAZOLAM HCL 2 MG/2ML IJ SOLN
1.0000 mg | INTRAMUSCULAR | Status: DC | PRN
  Administered 2016-07-08: 1 mg via INTRAVENOUS
  Filled 2016-07-08 (×2): qty 2

## 2016-07-08 MED ORDER — FENTANYL CITRATE (PF) 100 MCG/2ML IJ SOLN
25.0000 ug | INTRAMUSCULAR | Status: DC | PRN
  Administered 2016-07-08 – 2016-07-09 (×3): 25 ug via INTRAVENOUS
  Administered 2016-07-09: 50 ug via INTRAVENOUS
  Filled 2016-07-08 (×4): qty 2

## 2016-07-08 MED ORDER — SENNOSIDES 8.8 MG/5ML PO SYRP
5.0000 mL | ORAL_SOLUTION | Freq: Two times a day (BID) | ORAL | Status: DC
  Administered 2016-07-08 – 2016-07-09 (×3): 5 mL
  Filled 2016-07-08 (×4): qty 5

## 2016-07-08 MED ORDER — SPIRONOLACTONE 5 MG/ML ORAL SUSPENSION
25.0000 mg | Freq: Every day | ORAL | Status: DC
  Administered 2016-07-08 – 2016-07-10 (×3): 25 mg
  Filled 2016-07-08 (×4): qty 5

## 2016-07-08 NOTE — Progress Notes (Signed)
ANTICOAGULATION CONSULT NOTE - Follow Up Consult  Pharmacy Consult for heparin and cefazolin Indication: atrial fibrillation/CVA and MSSA pneumonia  No Known Allergies  Patient Measurements: Weight: 193 lb 9 oz (87.8 kg) Heparin Dosing Weight: 87 kg  Vital Signs: Temp: 98.6 F (37 C) (02/18 0809) Temp Source: Oral (02/18 0809) BP: 95/57 (02/18 1000) Pulse Rate: 71 (02/18 1000)  Labs:  Recent Labs  07/06/16 0500  07/07/16 0501 07/07/16 0625 07/08/16 0557  HGB 8.8*  --  9.6*  --   --   HCT 29.5*  --  30.6*  --   --   PLT 378  --  412*  --   --   HEPARINUNFRC 0.52  < > >2.20* 0.56 0.50  CREATININE 1.05  --  1.22  --  1.16  < > = values in this interval not displayed.  Estimated Creatinine Clearance: 57.7 mL/min (by C-G formula based on SCr of 1.16 mg/dL).   Medications:  Prescriptions Prior to Admission  Medication Sig Dispense Refill Last Dose  . atorvastatin (LIPITOR) 10 MG tablet Take 10 mg by mouth daily.   unknown  . atorvastatin (LIPITOR) 40 MG tablet Take 40 mg by mouth daily.   Taking  . carbidopa-levodopa (SINEMET CR) 50-200 MG tablet Take 1 tablet by mouth 3 (three) times daily. Take it 9 AM, 1 PM and 5 PM. 90 tablet 5   . carbidopa-levodopa (SINEMET CR) 50-200 MG tablet Take 1 tablet by mouth 3 (three) times daily.   unknown  . Cyanocobalamin (B-12) 2500 MCG TABS Take by mouth.   Taking  . tamsulosin (FLOMAX) 0.4 MG CAPS capsule   0 Taking  . tamsulosin (FLOMAX) 0.4 MG CAPS capsule Take 0.4 mg by mouth daily.   unknown  . Testosterone Cypionate & Prop 200-20 MG/ML SOLN Inject into the muscle.   Taking    Assessment: 76 yo M admitted 06/29/2016 with recent embolic stroke (1/611/18) secondary to R-ICA stenosis and new onset Afib. He was previously on heparin but developed bleeding with suctioning on 2/9 and heparin was held. Pharmacy was consulted to resume heparin on 2/13.   Patient was previously treated for MSSA pneumonia with 8 days of antibiotics. Pharmacy  consulted to restart cefazolin due to repeat trach aspirate on 2/16 with staph aureus. Patient is afebrile and WBC is 12.5.  Heparin level 0.50 (therapeutic), Hgb 9.6 stable, Plt 412, no signs/symptoms of bleeding noted  Goal of Therapy:  Heparin level 0.3-0.5 units/ml Monitor platelets by anticoagulation protocol: Yes   Plan:  - Continue heparin 1800 units/hr - Monitor daily heparin level, CBC and signs/symptoms of bleeding - Cefazolin 1g IV q8h - Monitor C&S, clinical progression and length of therapy  Casilda Carlsaylor Cadi Rhinehart, PharmD, BCPS PGY-2 Infectious Diseases Pharmacy Resident Pager: [phone] 07/08/2016,11:12 AM

## 2016-07-08 NOTE — Progress Notes (Signed)
eLink Physician-Brief Progress Note Patient Name: Gregory [name] DOB: Jun 17, 1940 MRN: [id number]   Date of Service  07/08/2016  HPI/Events of Note  CVL is 6610 days old and 1/3 ports occluded. Request for PICC line.   eICU Interventions  Will order PICC line placed.      Intervention Category Intermediate Interventions: Other:  Gregory [name],Gregory [name] 07/08/2016, 6:43 PM
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
## 2016-07-08 NOTE — Progress Notes (Signed)
Patient transported to CT on full support without complications. Patient tolerated procedure and transport to and from CT. Patient now resting comfortably. RT will continue to monitor patient.

## 2016-07-09 LAB — HEPARIN LEVEL (UNFRACTIONATED): Heparin Unfractionated: 0.56 IU/mL (ref 0.30–0.70)

## 2016-07-09 LAB — GLUCOSE, CAPILLARY
GLUCOSE-CAPILLARY: 119 mg/dL — AB (ref 65–99)
GLUCOSE-CAPILLARY: 102 mg/dL — AB (ref 65–99)
GLUCOSE-CAPILLARY: 131 mg/dL — AB (ref 65–99)
Glucose-Capillary: 123 mg/dL — ABNORMAL HIGH (ref 65–99)
Glucose-Capillary: 111 mg/dL — ABNORMAL HIGH (ref 65–99)
Glucose-Capillary: 120 mg/dL — ABNORMAL HIGH (ref 65–99)

## 2016-07-09 LAB — CBC
HEMATOCRIT: 30.5 % — AB (ref 39.0–52.0)
HEMOGLOBIN: 9.1 g/dL — AB (ref 13.0–17.0)
MCH: 26.9 pg (ref 26.0–34.0)
MCHC: 29.8 g/dL — AB (ref 30.0–36.0)
MCV: 90.2 fL (ref 78.0–100.0)
Platelets: 388 10*3/uL (ref 150–400)
RBC: 3.38 MIL/uL — ABNORMAL LOW (ref 4.22–5.81)
RDW: 17.5 % — AB (ref 11.5–15.5)
WBC: 10 10*3/uL (ref 4.0–10.5)

## 2016-07-09 LAB — RENAL FUNCTION PANEL
ANION GAP: 10 (ref 5–15)
Albumin: 2.6 g/dL — ABNORMAL LOW (ref 3.5–5.0)
BUN: 39 mg/dL — AB (ref 6–20)
CHLORIDE: 99 mmol/L — AB (ref 101–111)
CO2: 30 mmol/L (ref 22–32)
Calcium: 9.1 mg/dL (ref 8.9–10.3)
Creatinine, Ser: 1.03 mg/dL (ref 0.61–1.24)
GFR calc Af Amer: >60 mL/min (ref >60)
GFR calc non Af Amer: >60 mL/min (ref >60)
GLUCOSE: 117 mg/dL — AB (ref 65–99)
Phosphorus: 3.9 mg/dL (ref 2.5–4.6)
Potassium: 4.2 mmol/L (ref 3.5–5.1)
Sodium: 139 mmol/L (ref 135–145)

## 2016-07-09 LAB — CULTURE, RESPIRATORY: SPECIAL REQUESTS: NORMAL

## 2016-07-09 LAB — T4, FREE: FREE T4: 1.13 ng/dL — AB (ref 0.61–1.12)

## 2016-07-09 LAB — TSH: TSH: 5.816 u[IU]/mL — AB (ref 0.350–4.500)

## 2016-07-09 LAB — CULTURE, RESPIRATORY W GRAM STAIN

## 2016-07-09 LAB — MAGNESIUM: Magnesium: 2.6 mg/dL — ABNORMAL HIGH (ref 1.7–2.4)

## 2016-07-09 MED ORDER — SODIUM CHLORIDE 0.9% FLUSH
10.0000 mL | INTRAVENOUS | Status: DC | PRN
  Administered 2016-07-14: 10 mL
  Administered 2016-07-15: 20 mL
  Administered 2016-07-16: 10 mL
  Administered 2016-07-17: 20 mL
  Administered 2016-07-19: 10 mL
  Filled 2016-07-09 (×5): qty 40

## 2016-07-09 MED ORDER — HEPARIN (PORCINE) IN NACL 100-0.45 UNIT/ML-% IJ SOLN
1650.0000 [IU]/h | INTRAMUSCULAR | Status: DC
  Administered 2016-07-09: 1700 [IU]/h via INTRAVENOUS
  Administered 2016-07-11: 1650 [IU]/h via INTRAVENOUS
  Filled 2016-07-09 (×6): qty 250

## 2016-07-09 MED ORDER — SENNOSIDES 8.8 MG/5ML PO SYRP
5.0000 mL | ORAL_SOLUTION | Freq: Every evening | ORAL | Status: DC | PRN
  Filled 2016-07-09: qty 5

## 2016-07-09 MED ORDER — TORSEMIDE 20 MG PO TABS
40.0000 mg | ORAL_TABLET | Freq: Two times a day (BID) | ORAL | Status: DC
  Administered 2016-07-09 – 2016-07-12 (×6): 40 mg
  Filled 2016-07-09 (×6): qty 2

## 2016-07-09 MED ORDER — SODIUM CHLORIDE 0.9% FLUSH
10.0000 mL | Freq: Two times a day (BID) | INTRAVENOUS | Status: DC
  Administered 2016-07-09 – 2016-07-12 (×7): 10 mL
  Administered 2016-07-12: 30 mL
  Administered 2016-07-13 – 2016-07-18 (×6): 10 mL

## 2016-07-09 MED ORDER — CHLORHEXIDINE GLUCONATE CLOTH 2 % EX PADS
6.0000 | MEDICATED_PAD | Freq: Every day | CUTANEOUS | Status: DC
  Administered 2016-07-10 – 2016-07-19 (×10): 6 via TOPICAL
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
## 2016-07-09 NOTE — Progress Notes (Signed)
Peripherally Inserted Central Catheter/Midline Placement  The IV Nurse has discussed with the patient and/or persons authorized to consent for the patient, the purpose of this procedure and the potential benefits and risks involved with this procedure.  The benefits include less needle sticks, lab draws from the catheter, and the patient may be discharged home with the catheter. Risks include, but not limited to, infection, bleeding, blood clot (thrombus formation), and puncture of an artery; nerve damage and irregular heartbeat and possibility to perform a PICC exchange if needed/ordered by physician.  Alternatives to this procedure were also discussed.  Bard Power PICC patient education guide, fact sheet on infection prevention and patient information card has been provided to patient /or left at bedside.    PICC/Midline Placement Documentation  PICC Double Lumen 07/09/16 PICC Right Basilic 45 cm 2 cm (Active)  Indication for Insertion or Continuance of Line Prolonged intravenous therapies 07/09/2016 11:00 AM  Exposed Catheter (cm) 2 cm 07/09/2016 11:00 AM  Dressing Change Due 07/16/16 07/09/2016 11:00 AM       Stacie GlazeJoyce, Rodarius Kichline Horton 07/09/2016, 11:56 AM

## 2016-07-10 ENCOUNTER — Ambulatory Visit: Payer: Self-pay | Admitting: Otolaryngology

## 2016-07-10 LAB — CBC
HEMATOCRIT: 30.6 % — AB (ref 39.0–52.0)
HEMOGLOBIN: 9.6 g/dL — AB (ref 13.0–17.0)
MCH: 28.4 pg (ref 26.0–34.0)
MCHC: 31.4 g/dL (ref 30.0–36.0)
MCV: 90.5 fL (ref 78.0–100.0)
Platelets: 414 10*3/uL — ABNORMAL HIGH (ref 150–400)
RBC: 3.38 MIL/uL — AB (ref 4.22–5.81)
RDW: 17.8 % — ABNORMAL HIGH (ref 11.5–15.5)
WBC: 9.6 10*3/uL (ref 4.0–10.5)

## 2016-07-10 LAB — RENAL FUNCTION PANEL
Albumin: 2.6 g/dL — ABNORMAL LOW (ref 3.5–5.0)
Anion gap: 10 (ref 5–15)
BUN: 38 mg/dL — ABNORMAL HIGH (ref 6–20)
CO2: 30 mmol/L (ref 22–32)
Calcium: 9.2 mg/dL (ref 8.9–10.3)
Chloride: 99 mmol/L — ABNORMAL LOW (ref 101–111)
Creatinine, Ser: 1.09 mg/dL (ref 0.61–1.24)
GFR calc Af Amer: >60 mL/min
GFR calc non Af Amer: >60 mL/min
Glucose, Bld: 129 mg/dL — ABNORMAL HIGH (ref 65–99)
Phosphorus: 3.9 mg/dL (ref 2.5–4.6)
Potassium: 3.9 mmol/L (ref 3.5–5.1)
Sodium: 139 mmol/L (ref 135–145)

## 2016-07-10 LAB — GLUCOSE, CAPILLARY
GLUCOSE-CAPILLARY: 118 mg/dL — AB (ref 65–99)
Glucose-Capillary: 126 mg/dL — ABNORMAL HIGH (ref 65–99)
Glucose-Capillary: 111 mg/dL — ABNORMAL HIGH (ref 65–99)
Glucose-Capillary: 121 mg/dL — ABNORMAL HIGH (ref 65–99)
Glucose-Capillary: 127 mg/dL — ABNORMAL HIGH (ref 65–99)

## 2016-07-10 LAB — HEPARIN LEVEL (UNFRACTIONATED)
HEPARIN UNFRACTIONATED: 0.5 [IU]/mL (ref 0.30–0.70)
Heparin Unfractionated: 0.4 IU/mL (ref 0.30–0.70)

## 2016-07-10 LAB — MAGNESIUM: Magnesium: 2.5 mg/dL — ABNORMAL HIGH (ref 1.7–2.4)

## 2016-07-10 MED ORDER — FUROSEMIDE 10 MG/ML IJ SOLN
20.0000 mg | Freq: Once | INTRAMUSCULAR | Status: AC
  Administered 2016-07-10: 20 mg via INTRAVENOUS
  Filled 2016-07-10: qty 2

## 2016-07-10 MED ORDER — ATROPINE SULFATE 1 MG/10ML IJ SOSY
PREFILLED_SYRINGE | INTRAMUSCULAR | Status: DC
Start: 2016-07-10 — End: 2016-07-12
  Filled 2016-07-10: qty 10
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
## 2016-07-10 NOTE — Progress Notes (Signed)
ANTICOAGULATION CONSULT NOTE - Follow Up Consult  Pharmacy Consult for heparin Indication: atrial fibrillation and CVA  No Known Allergies  Patient Measurements: Weight: 193 lb 2 oz (87.6 kg)  Vital Signs: Temp: 98.5 F (36.9 C) (02/20 1208) Temp Source: Oral (02/20 1208) BP: 120/54 (02/20 1200) Pulse Rate: 60 (02/20 1200)  Labs:  Recent Labs  07/08/16 0557 07/09/16 0445 07/10/16 0354 07/10/16 1135  HGB  --  9.1* 9.6*  --   HCT  --  30.5* 30.6*  --   PLT  --  388 414*  --   HEPARINUNFRC 0.50 0.56 0.40 0.50  CREATININE 1.16 1.03 1.09  --     Estimated Creatinine Clearance: 61.4 mL/min (by C-G formula based on SCr of 1.09 mg/dL).   Medications:  Prescriptions Prior to Admission  Medication Sig Dispense Refill Last Dose  . atorvastatin (LIPITOR) 10 MG tablet Take 10 mg by mouth daily.   unknown  . atorvastatin (LIPITOR) 40 MG tablet Take 40 mg by mouth daily.   Taking  . carbidopa-levodopa (SINEMET CR) 50-200 MG tablet Take 1 tablet by mouth 3 (three) times daily. Take it 9 AM, 1 PM and 5 PM. 90 tablet 5   . carbidopa-levodopa (SINEMET CR) 50-200 MG tablet Take 1 tablet by mouth 3 (three) times daily.   unknown  . Cyanocobalamin (B-12) 2500 MCG TABS Take by mouth.   Taking  . tamsulosin (FLOMAX) 0.4 MG CAPS capsule   0 Taking  . tamsulosin (FLOMAX) 0.4 MG CAPS capsule Take 0.4 mg by mouth daily.   unknown  . Testosterone Cypionate & Prop 200-20 MG/ML SOLN Inject into the muscle.   Taking    Assessment: 76 yo M admitted 2/9/2018with recent embolic stroke (4/091/18) secondary to R-ICA stenosis and new onset Afib. He was previously on heparin but developed bleeding with suctioning on 2/9 and heparin was held. Pharmacy was consulted to resume heparin on 2/13.  Heparin level 0.50 (therapeutic), Hgb 9.6, Plt 414. No signs/symptoms of bleeding noted.  Goal of Therapy:  Heparin level 0.3-0.5 units/ml Monitor platelets by anticoagulation protocol: Yes   Plan:  - Decrease  heparin to 1650 units/hr - Monitor daily heparin level, CBC and signs/symptoms  Casilda Carlsaylor Marjorie Lussier, PharmD, BCPS PGY-2 Infectious Diseases Pharmacy Resident Pager: [phone] 07/10/2016,12:55 PM

## 2016-07-10 NOTE — Progress Notes (Signed)
Paged Dr.McMikell regarding pt decreased HR as low as 37. Pt HR came right back up to the 40's-50's. Pt sleep resting in bed. MD aware. Will continue to monitor pt.   Roselie AwkwardMegan Cleatis Fandrich, RN

## 2016-07-10 NOTE — Progress Notes (Signed)
[facility] regarding pt brady in the 40's. EL[facility] aware. Will continue to monitor pt.  Roselie AwkwardMegan Swayze Kozuch, RN

## 2016-07-10 NOTE — Progress Notes (Addendum)
Dr Lucky Rathkeosen's number given to patient's son at bedside and he relayed it to his mom to call about tracheostomy. I talked with patient's wife and daughter [name] after they talked with Dr Pollyann Kennedyosen and consent was obtained.I also informed them both that the time of procedure was 1230 tomorrow.
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
## 2016-07-10 NOTE — Consult Note (Signed)
Reason for Consult: Respiratory failure, tracheostomy Referring Physician: Wesam G Yacoub, MD  Gregory [name] is an 76 y.o. male.  HPI: Prolonged intubation, unable to wean, needing tracheostomy.  Past Medical History:  Diagnosis Date  . AAA (abdominal aortic aneurysm) (HCC)   . Abnormal stress test 08/08/12  . AF (atrial fibrillation) (HCC) 2014  . Atrial fibrillation (HCC)   . Bilateral carotid artery stenosis 08/14/12   moderate bilaterally per carotid duplex  . CAD (coronary artery disease)   . Coronary artery disease   . Deformed pylorus, acquired   . Erythema of esophagus   . Esophageal stricture   . Fatty infiltration of liver   . Fatty liver   . GERD (gastroesophageal reflux disease)   . History of esophageal stricture   . Hyperlipidemia   . Parkinsonism (HCC)   . S/P cardiac cath 08/12/12  . Splenomegaly     Past Surgical History:  Procedure Laterality Date  . COLONOSCOPY  2004  . CORONARY ARTERY BYPASS GRAFT    . CORONARY ARTERY BYPASS GRAFT N/A 09/16/2012   Procedure: CORONARY ARTERY BYPASS GRAFTING (CABG) TIMES ONE USING LEFT INTERNAL MAMMARY ARTERY;  Surgeon: Bryan K Bartle, MD;  Location: [facility] OR;  Service: Open Heart Surgery;  Laterality: N/A;  . EYE SURGERY  2012   left cataract extraction  . INNER EAR SURGERY  1995   Dr. Kauffman...placement of shunt  . LEFT HEART CATHETERIZATION WITH CORONARY ANGIOGRAM N/A 08/19/2012   Procedure: LEFT HEART CATHETERIZATION WITH CORONARY ANGIOGRAM;  Surgeon: Jagadeesh R Ganji, MD;  Location: [facility] CATH LAB;  Service: Cardiovascular;  Laterality: N/A;    Family History  Problem Relation Age of Onset  . Heart disease Father     died from  . Congestive Heart Failure Father   . Kidney disease Mother   . Stroke Brother     Social History:  reports that he quit smoking about 28 years ago. He has never used smokeless tobacco. He reports that he does not drink alcohol or use drugs.  Allergies: No Known Allergies  Medications:  Reviewed  Results for orders placed or performed during the hospital encounter of 06/29/16 (from the past 48 hour(s))  Glucose, capillary     Status: Abnormal   Collection Time: 07/08/16  4:23 PM  Result Value Ref Range   Glucose-Capillary 100 (H) 65 - 99 mg/dL   Comment 1 Notify RN    Comment 2 Document in Chart   Glucose, capillary     Status: Abnormal   Collection Time: 07/08/16  8:01 PM  Result Value Ref Range   Glucose-Capillary 121 (H) 65 - 99 mg/dL   Comment 1 Notify RN   Glucose, capillary     Status: Abnormal   Collection Time: 07/08/16 11:52 PM  Result Value Ref Range   Glucose-Capillary 131 (H) 65 - 99 mg/dL   Comment 1 Notify RN   Glucose, capillary     Status: Abnormal   Collection Time: 07/09/16  4:11 AM  Result Value Ref Range   Glucose-Capillary 123 (H) 65 - 99 mg/dL   Comment 1 Notify RN   Heparin level (unfractionated)     Status: None   Collection Time: 07/09/16  4:45 AM  Result Value Ref Range   Heparin Unfractionated 0.56 0.30 - 0.70 IU/mL    Comment:        IF HEPARIN RESULTS ARE BELOW EXPECTED VALUES, AND PATIENT DOSAGE HAS BEEN CONFIRMED, SUGGEST FOLLOW UP TESTING OF ANTITHROMBIN III LEVELS.     Magnesium     Status: Abnormal   Collection Time: 07/09/16  4:45 AM  Result Value Ref Range   Magnesium 2.6 (H) 1.7 - 2.4 mg/dL  CBC     Status: Abnormal   Collection Time: 07/09/16  4:45 AM  Result Value Ref Range   WBC 10.0 4.0 - 10.5 K/uL   RBC 3.38 (L) 4.22 - 5.81 MIL/uL   Hemoglobin 9.1 (L) 13.0 - 17.0 g/dL   HCT 30.5 (L) 39.0 - 52.0 %   MCV 90.2 78.0 - 100.0 fL   MCH 26.9 26.0 - 34.0 pg   MCHC 29.8 (L) 30.0 - 36.0 g/dL   RDW 17.5 (H) 11.5 - 15.5 %   Platelets 388 150 - 400 K/uL  Renal function panel     Status: Abnormal   Collection Time: 07/09/16  4:45 AM  Result Value Ref Range   Sodium 139 135 - 145 mmol/L   Potassium 4.2 3.5 - 5.1 mmol/L   Chloride 99 (L) 101 - 111 mmol/L   CO2 30 22 - 32 mmol/L   Glucose, Bld 117 (H) 65 - 99 mg/dL    BUN 39 (H) 6 - 20 mg/dL   Creatinine, Ser 1.03 0.61 - 1.24 mg/dL   Calcium 9.1 8.9 - 10.3 mg/dL   Phosphorus 3.9 2.5 - 4.6 mg/dL   Albumin 2.6 (L) 3.5 - 5.0 g/dL   GFR calc non Af Amer >60 >60 mL/min   GFR calc Af Amer >60 >60 mL/min    Comment: (NOTE) The eGFR has been calculated using the CKD EPI equation. This calculation has not been validated in all clinical situations. eGFR's persistently <60 mL/min signify possible Chronic Kidney Disease.    Anion gap 10 5 - 15  Glucose, capillary     Status: Abnormal   Collection Time: 07/09/16  7:40 AM  Result Value Ref Range   Glucose-Capillary 119 (H) 65 - 99 mg/dL   Comment 1 Notify RN    Comment 2 Document in Chart   TSH     Status: Abnormal   Collection Time: 07/09/16 10:39 AM  Result Value Ref Range   TSH 5.816 (H) 0.350 - 4.500 uIU/mL    Comment: Performed by a 3rd Generation assay with a functional sensitivity of <=0.01 uIU/mL.  T4, free     Status: Abnormal   Collection Time: 07/09/16 10:39 AM  Result Value Ref Range   Free T4 1.13 (H) 0.61 - 1.12 ng/dL    Comment: (NOTE) Biotin ingestion may interfere with free T4 tests. If the results are inconsistent with the TSH level, previous test results, or the clinical presentation, then consider biotin interference. If needed, order repeat testing after stopping biotin.   Glucose, capillary     Status: Abnormal   Collection Time: 07/09/16 12:28 PM  Result Value Ref Range   Glucose-Capillary 102 (H) 65 - 99 mg/dL   Comment 1 Notify RN    Comment 2 Document in Chart   Glucose, capillary     Status: Abnormal   Collection Time: 07/09/16  3:37 PM  Result Value Ref Range   Glucose-Capillary 111 (H) 65 - 99 mg/dL   Comment 1 Document in Chart   Glucose, capillary     Status: Abnormal   Collection Time: 07/09/16  7:31 PM  Result Value Ref Range   Glucose-Capillary 131 (H) 65 - 99 mg/dL   Comment 1 Notify RN   Glucose, capillary     Status: Abnormal   Collection Time: 07/09/16  11:14 PM  Result Value Ref Range   Glucose-Capillary 120 (H) 65 - 99 mg/dL   Comment 1 Notify RN   Heparin level (unfractionated)     Status: None   Collection Time: 07/10/16  3:54 AM  Result Value Ref Range   Heparin Unfractionated 0.40 0.30 - 0.70 IU/mL    Comment:        IF HEPARIN RESULTS ARE BELOW EXPECTED VALUES, AND PATIENT DOSAGE HAS BEEN CONFIRMED, SUGGEST FOLLOW UP TESTING OF ANTITHROMBIN III LEVELS.   Magnesium     Status: Abnormal   Collection Time: 07/10/16  3:54 AM  Result Value Ref Range   Magnesium 2.5 (H) 1.7 - 2.4 mg/dL  CBC     Status: Abnormal   Collection Time: 07/10/16  3:54 AM  Result Value Ref Range   WBC 9.6 4.0 - 10.5 K/uL   RBC 3.38 (L) 4.22 - 5.81 MIL/uL   Hemoglobin 9.6 (L) 13.0 - 17.0 g/dL   HCT 30.6 (L) 39.0 - 52.0 %   MCV 90.5 78.0 - 100.0 fL   MCH 28.4 26.0 - 34.0 pg   MCHC 31.4 30.0 - 36.0 g/dL   RDW 17.8 (H) 11.5 - 15.5 %   Platelets 414 (H) 150 - 400 K/uL  Renal function panel     Status: Abnormal   Collection Time: 07/10/16  3:54 AM  Result Value Ref Range   Sodium 139 135 - 145 mmol/L   Potassium 3.9 3.5 - 5.1 mmol/L   Chloride 99 (L) 101 - 111 mmol/L   CO2 30 22 - 32 mmol/L   Glucose, Bld 129 (H) 65 - 99 mg/dL   BUN 38 (H) 6 - 20 mg/dL   Creatinine, Ser 1.09 0.61 - 1.24 mg/dL   Calcium 9.2 8.9 - 10.3 mg/dL   Phosphorus 3.9 2.5 - 4.6 mg/dL   Albumin 2.6 (L) 3.5 - 5.0 g/dL   GFR calc non Af Amer >60 >60 mL/min   GFR calc Af Amer >60 >60 mL/min    Comment: (NOTE) The eGFR has been calculated using the CKD EPI equation. This calculation has not been validated in all clinical situations. eGFR's persistently <60 mL/min signify possible Chronic Kidney Disease.    Anion gap 10 5 - 15  Glucose, capillary     Status: Abnormal   Collection Time: 07/10/16  4:00 AM  Result Value Ref Range   Glucose-Capillary 126 (H) 65 - 99 mg/dL   Comment 1 Notify RN   Glucose, capillary     Status: Abnormal   Collection Time: 07/10/16  7:37 AM   Result Value Ref Range   Glucose-Capillary 118 (H) 65 - 99 mg/dL   Comment 1 Notify RN   Heparin level (unfractionated)     Status: None   Collection Time: 07/10/16 11:35 AM  Result Value Ref Range   Heparin Unfractionated 0.50 0.30 - 0.70 IU/mL    Comment:        IF HEPARIN RESULTS ARE BELOW EXPECTED VALUES, AND PATIENT DOSAGE HAS BEEN CONFIRMED, SUGGEST FOLLOW UP TESTING OF ANTITHROMBIN III LEVELS.   Glucose, capillary     Status: Abnormal   Collection Time: 07/10/16 12:07 PM  Result Value Ref Range   Glucose-Capillary 111 (H) 65 - 99 mg/dL   Comment 1 Notify RN    Comment 2 Document in Chart     Ct Head Wo Contrast  Result Date: 07/08/2016 CLINICAL DATA:  Patient with confusion and hypotension. EXAM: CT HEAD WITHOUT CONTRAST TECHNIQUE: Contiguous axial  images were obtained from the base of the skull through the vertex without intravenous contrast. COMPARISON:  MRI brain 06/08/2016 ; CT brain 06/07/2016. FINDINGS: Brain: Ventricles and sulci are appropriate for patient's age. Evolving infarct within the right thalamus and internal capsule. Additional evolving infarct within the right periatrial white matter. No evidence for intracranial hemorrhage, mass lesion or mass-effect. Vascular: No hyperdense vessel or unexpected calcification. Skull: Normal. Negative for fracture or focal lesion. Sinuses/Orbits: Opacification of the left maxillary sinus. Post mastoidectomy on the left. Other: None. IMPRESSION: Evolving infarcts within the right thalamus and internal capsule as well as periatrial right matter. No intracranial hemorrhage or significant mass effect. Electronically Signed   By: Lovey Newcomer M.D.   On: 07/08/2016 16:35    SMO:[id number] except as listed in admit H&P  Blood pressure (!) 120/54, pulse 60, temperature 98.5 F (36.9 C), temperature source Oral, resp. rate 16, weight 87.6 kg (193 lb 2 oz), SpO2 100 %.  PHYSICAL EXAM: Overall appearance:  Orally intubated with gentle  restraints on extremities, able to answer questions by knotting. Head:  Normocephalic, atraumatic. Ears: External ears look healthy. Nose: External nose is healthy in appearance. Internal nasal exam free of any lesions or obstruction. Oral Cavity/Pharynx:  There are no mucosal lesions or masses identified. Endotracheal tube in place. Larynx/Hypopharynx: Deferred Neuro:  Not evaluated. Neck: No palpable neck masses.  Studies Reviewed: none  Procedures: none   Assessment/Plan: Agree with need for tracheostomy. I will discuss this with the relatives and we will schedule at the next available time.  Meleane Selinger 07/10/2016, 12:47 PM

## 2016-07-10 NOTE — Progress Notes (Signed)
ANTICOAGULATION CONSULT NOTE - Follow Up Consult  Pharmacy Consult for Heparin  Indication: atrial fibrillation and stroke  No Known Allergies  Patient Measurements: Weight: 195 lb 12.3 oz (88.8 kg)  Vital Signs: Temp: 97.9 F (36.6 C) (02/20 0401) Temp Source: Oral (02/20 0401) BP: 92/40 (02/20 0400) Pulse Rate: 66 (02/20 0400)  Labs:  Recent Labs  07/07/16 0501  07/08/16 0557 07/09/16 0445 07/10/16 0354  HGB 9.6*  --   --  9.1* 9.6*  HCT 30.6*  --   --  30.5* 30.6*  PLT 412*  --   --  388 414*  HEPARINUNFRC >2.20*  < > 0.50 0.56 0.40  CREATININE 1.22  --  1.16 1.03  --   < > = values in this interval not displayed.  Estimated Creatinine Clearance: 65 mL/min (by C-G formula based on SCr of 1.03 mg/dL).   Assessment: Therapeutic heparin level x 1 after rate decrease  Goal of Therapy:  Heparin level 0.3-0.5 units/ml Monitor platelets by anticoagulation protocol: Yes   Plan:  -Cont heparin at 1700 units/hr -1200 HL  Abran DukeLedford, Ivannah Zody 07/10/2016,4:39 AM
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
## 2016-07-11 ENCOUNTER — Inpatient Hospital Stay (HOSPITAL_COMMUNITY): Payer: Medicare Other | Admitting: Anesthesiology

## 2016-07-11 ENCOUNTER — Encounter (HOSPITAL_COMMUNITY)
Admission: AD | Disposition: A | Discharge status: Rehabilitation Facility | Payer: Self-pay | Source: Ambulatory Visit | Attending: Pulmonary Disease

## 2016-07-11 ENCOUNTER — Encounter (HOSPITAL_COMMUNITY): Payer: Self-pay | Admitting: Anesthesiology

## 2016-07-11 DIAGNOSIS — J9620 Acute and chronic respiratory failure, unspecified whether with hypoxia or hypercapnia: Secondary | ICD-10-CM

## 2016-07-11 HISTORY — PX: TRACHEOSTOMY TUBE PLACEMENT: SHX814

## 2016-07-11 LAB — RENAL FUNCTION PANEL
Albumin: 2.9 g/dL — ABNORMAL LOW (ref 3.5–5.0)
Anion gap: 9 (ref 5–15)
BUN: 40 mg/dL — ABNORMAL HIGH (ref 6–20)
CO2: 31 mmol/L (ref 22–32)
Calcium: 9.2 mg/dL (ref 8.9–10.3)
Chloride: 97 mmol/L — ABNORMAL LOW (ref 101–111)
Creatinine, Ser: 1.12 mg/dL (ref 0.61–1.24)
GFR calc Af Amer: >60 mL/min (ref >60)
GFR calc non Af Amer: >60 mL/min (ref >60)
Glucose, Bld: 119 mg/dL — ABNORMAL HIGH (ref 65–99)
Phosphorus: 4 mg/dL (ref 2.5–4.6)
Potassium: 4.2 mmol/L (ref 3.5–5.1)
Sodium: 137 mmol/L (ref 135–145)

## 2016-07-11 LAB — GLUCOSE, CAPILLARY
GLUCOSE-CAPILLARY: 100 mg/dL — AB (ref 65–99)
GLUCOSE-CAPILLARY: 127 mg/dL — AB (ref 65–99)
GLUCOSE-CAPILLARY: 128 mg/dL — AB (ref 65–99)
Glucose-Capillary: 111 mg/dL — ABNORMAL HIGH (ref 65–99)
Glucose-Capillary: 109 mg/dL — ABNORMAL HIGH (ref 65–99)
Glucose-Capillary: 103 mg/dL — ABNORMAL HIGH (ref 65–99)
Glucose-Capillary: 123 mg/dL — ABNORMAL HIGH (ref 65–99)

## 2016-07-11 LAB — CBC
HCT: 32.3 % — ABNORMAL LOW (ref 39.0–52.0)
HEMOGLOBIN: 9.9 g/dL — AB (ref 13.0–17.0)
MCH: 27.7 pg (ref 26.0–34.0)
MCHC: 30.7 g/dL (ref 30.0–36.0)
MCV: 90.2 fL (ref 78.0–100.0)
PLATELETS: 437 10*3/uL — AB (ref 150–400)
RBC: 3.58 MIL/uL — AB (ref 4.22–5.81)
RDW: 17.6 % — ABNORMAL HIGH (ref 11.5–15.5)
WBC: 10.2 10*3/uL (ref 4.0–10.5)

## 2016-07-11 LAB — MAGNESIUM: Magnesium: 2.5 mg/dL — ABNORMAL HIGH (ref 1.7–2.4)

## 2016-07-11 LAB — HEPARIN LEVEL (UNFRACTIONATED): Heparin Unfractionated: 0.5 IU/mL (ref 0.30–0.70)

## 2016-07-11 SURGERY — CREATION, TRACHEOSTOMY
Anesthesia: General

## 2016-07-11 MED ORDER — SPIRONOLACTONE 5 MG/ML ORAL SUSPENSION
25.0000 mg | Freq: Every day | ORAL | Status: DC
  Administered 2016-07-11: 25 mg
  Filled 2016-07-11 (×2): qty 5

## 2016-07-11 MED ORDER — PHENYLEPHRINE 40 MCG/ML (10ML) SYRINGE FOR IV PUSH (FOR BLOOD PRESSURE SUPPORT)
PREFILLED_SYRINGE | INTRAVENOUS | Status: AC
  Filled 2016-07-11: qty 30

## 2016-07-11 MED ORDER — ROCURONIUM BROMIDE 100 MG/10ML IV SOLN
INTRAVENOUS | Status: DC | PRN
  Administered 2016-07-11: 35 mg via INTRAVENOUS

## 2016-07-11 MED ORDER — LIDOCAINE HCL (PF) 1 % IJ SOLN
INTRAMUSCULAR | Status: AC
  Filled 2016-07-11: qty 30

## 2016-07-11 MED ORDER — PHENYLEPHRINE 40 MCG/ML (10ML) SYRINGE FOR IV PUSH (FOR BLOOD PRESSURE SUPPORT)
PREFILLED_SYRINGE | INTRAVENOUS | Status: AC
  Filled 2016-07-11: qty 10

## 2016-07-11 MED ORDER — ONDANSETRON HCL 4 MG/2ML IJ SOLN
INTRAMUSCULAR | Status: DC | PRN
Start: 2016-07-11 — End: 2016-07-11
  Administered 2016-07-11: 4 mg via INTRAVENOUS

## 2016-07-11 MED ORDER — ROCURONIUM BROMIDE 50 MG/5ML IV SOSY
PREFILLED_SYRINGE | INTRAVENOUS | Status: AC
  Filled 2016-07-11: qty 5

## 2016-07-11 MED ORDER — EPHEDRINE 5 MG/ML INJ
INTRAVENOUS | Status: AC
  Filled 2016-07-11: qty 20

## 2016-07-11 MED ORDER — FENTANYL CITRATE (PF) 100 MCG/2ML IJ SOLN
INTRAMUSCULAR | Status: DC | PRN
  Administered 2016-07-11 (×2): 50 ug via INTRAVENOUS

## 2016-07-11 MED ORDER — PROPOFOL 10 MG/ML IV BOLUS
INTRAVENOUS | Status: AC
  Filled 2016-07-11: qty 20

## 2016-07-11 MED ORDER — MIDAZOLAM HCL 2 MG/2ML IJ SOLN
INTRAMUSCULAR | Status: AC
  Filled 2016-07-11: qty 2

## 2016-07-11 MED ORDER — PHENYLEPHRINE HCL 10 MG/ML IJ SOLN
INTRAMUSCULAR | Status: DC | PRN
  Administered 2016-07-11: 80 ug via INTRAVENOUS

## 2016-07-11 MED ORDER — FENTANYL CITRATE (PF) 100 MCG/2ML IJ SOLN
INTRAMUSCULAR | Status: AC
  Filled 2016-07-11: qty 2

## 2016-07-11 MED ORDER — ONDANSETRON HCL 4 MG/2ML IJ SOLN
INTRAMUSCULAR | Status: AC
  Filled 2016-07-11: qty 4

## 2016-07-11 MED ORDER — ARTIFICIAL TEARS OP OINT
TOPICAL_OINTMENT | OPHTHALMIC | Status: DC | PRN
  Administered 2016-07-11: 1 via OPHTHALMIC

## 2016-07-11 MED ORDER — LIDOCAINE 2% (20 MG/ML) 5 ML SYRINGE
INTRAMUSCULAR | Status: AC
  Filled 2016-07-11: qty 20

## 2016-07-11 MED ORDER — HEPARIN (PORCINE) IN NACL 100-0.45 UNIT/ML-% IJ SOLN
1650.0000 [IU]/h | INTRAMUSCULAR | Status: DC
  Administered 2016-07-12: 1650 [IU]/h via INTRAVENOUS
  Filled 2016-07-11 (×2): qty 250

## 2016-07-11 MED ORDER — MIDAZOLAM HCL 5 MG/5ML IJ SOLN
INTRAMUSCULAR | Status: DC | PRN
  Administered 2016-07-11: 2 mg via INTRAVENOUS

## 2016-07-11 MED ORDER — SUCCINYLCHOLINE CHLORIDE 200 MG/10ML IV SOSY
PREFILLED_SYRINGE | INTRAVENOUS | Status: AC
  Filled 2016-07-11: qty 10

## 2016-07-11 MED ORDER — 0.9 % SODIUM CHLORIDE (POUR BTL) OPTIME
TOPICAL | Status: DC | PRN
  Administered 2016-07-11: 1000 mL

## 2016-07-11 MED ORDER — CEFAZOLIN IN D5W 1 GM/50ML IV SOLN
1.0000 g | Freq: Three times a day (TID) | INTRAVENOUS | Status: AC
  Administered 2016-07-11 – 2016-07-14 (×9): 1 g via INTRAVENOUS
  Filled 2016-07-11 (×10): qty 50

## 2016-07-11 MED ORDER — SUGAMMADEX SODIUM 200 MG/2ML IV SOLN
INTRAVENOUS | Status: AC
  Filled 2016-07-11: qty 2

## 2016-07-11 MED ORDER — LACTATED RINGERS IV SOLN
INTRAVENOUS | Status: DC | PRN
  Administered 2016-07-11: 12:00:00 via INTRAVENOUS

## 2016-07-11 MED ORDER — CARBIDOPA-LEVODOPA 25-100 MG PO TABS
1.5000 | ORAL_TABLET | Freq: Four times a day (QID) | ORAL | Status: DC
  Administered 2016-07-11 – 2016-07-19 (×33): 1.5
  Filled 2016-07-11 (×37): qty 1.5

## 2016-07-11 SURGICAL SUPPLY — 37 items
BENZOIN TINCTURE PRP APPL 2/3 (GAUZE/BANDAGES/DRESSINGS) ×2 IMPLANT
BLADE SURG 15 STRL LF DISP TIS (BLADE) IMPLANT
BLADE SURG 15 STRL SS (BLADE)
BLADE SURG ROTATE 9660 (MISCELLANEOUS) IMPLANT
CANISTER SUCTION 2500CC (MISCELLANEOUS) ×2 IMPLANT
CLEANER TIP ELECTROSURG 2X2 (MISCELLANEOUS) ×2 IMPLANT
COVER SURGICAL LIGHT HANDLE (MISCELLANEOUS) ×2 IMPLANT
DECANTER SPIKE VIAL GLASS SM (MISCELLANEOUS) ×2 IMPLANT
DRAPE PROXIMA HALF (DRAPES) IMPLANT
ELECT COATED BLADE 2.86 ST (ELECTRODE) ×2 IMPLANT
ELECT REM PT RETURN 9FT ADLT (ELECTROSURGICAL) ×2
ELECTRODE REM PT RTRN 9FT ADLT (ELECTROSURGICAL) ×1 IMPLANT
GAUZE SPONGE 4X4 16PLY XRAY LF (GAUZE/BANDAGES/DRESSINGS) ×2 IMPLANT
GLOVE BIOGEL PI IND STRL 6.5 (GLOVE) ×1 IMPLANT
GLOVE BIOGEL PI INDICATOR 6.5 (GLOVE) ×1
GLOVE ECLIPSE 7.5 STRL STRAW (GLOVE) ×2 IMPLANT
GOWN STRL REUS W/ TWL LRG LVL3 (GOWN DISPOSABLE) ×2 IMPLANT
GOWN STRL REUS W/TWL LRG LVL3 (GOWN DISPOSABLE) ×2
KIT BASIN OR (CUSTOM PROCEDURE TRAY) ×2 IMPLANT
KIT ROOM TURNOVER OR (KITS) ×2 IMPLANT
NEEDLE PRECISIONGLIDE 27X1.5 (NEEDLE) ×2 IMPLANT
NS IRRIG 1000ML POUR BTL (IV SOLUTION) ×2 IMPLANT
PACK EENT II TURBAN DRAPE (CUSTOM PROCEDURE TRAY) ×2 IMPLANT
PAD ARMBOARD 7.5X6 YLW CONV (MISCELLANEOUS) ×4 IMPLANT
PENCIL FOOT CONTROL (ELECTRODE) ×2 IMPLANT
SPONGE DRAIN TRACH 4X4 STRL 2S (GAUZE/BANDAGES/DRESSINGS) ×2 IMPLANT
SUT CHROMIC 2 0 SH (SUTURE) ×2 IMPLANT
SUT ETHILON 3 0 PS 1 (SUTURE) ×2 IMPLANT
SUT SILK 4 0 TIE 10X30 (SUTURE) ×2 IMPLANT
SUT SILK 4 0 TIES 17X18 (SUTURE) ×2 IMPLANT
SYR 20CC LL (SYRINGE) ×2 IMPLANT
SYR CONTROL 10ML LL (SYRINGE) ×2 IMPLANT
TOWEL OR 17X24 6PK STRL BLUE (TOWEL DISPOSABLE) ×2 IMPLANT
TUBE CONNECTING 12X1/4 (SUCTIONS) ×2 IMPLANT
TUBE TRACH EXLNGT  8 SNGL (TUBING) ×1
TUBE TRACH EXLNGT 8 SNGL (TUBING) ×1 IMPLANT
WATER STERILE IRR 1000ML POUR (IV SOLUTION) ×2 IMPLANT

## 2016-07-11 NOTE — Progress Notes (Signed)
Received pt from OR and placed on monitor in room and vent.trach supplies at bedside for #8 shiley trach . Family returned to room to visit and education provided.

## 2016-07-11 NOTE — H&P (View-Only) (Signed)
Reason for Consult: Respiratory failure, tracheostomy Referring Physician: Rush Farmer, MD  Gregory [name] is an 76 y.o. male.  HPI: Prolonged intubation, unable to wean, needing tracheostomy.  Past Medical History:  Diagnosis Date  . AAA (abdominal aortic aneurysm) ([place])   . Abnormal stress test 08/08/12  . AF (atrial fibrillation) ([place]) 2014  . Atrial fibrillation ([place])   . Bilateral carotid artery stenosis 08/14/12   moderate bilaterally per carotid duplex  . CAD (coronary artery disease)   . Coronary artery disease   . Deformed pylorus, acquired   . Erythema of esophagus   . Esophageal stricture   . Fatty infiltration of liver   . Fatty liver   . GERD (gastroesophageal reflux disease)   . History of esophageal stricture   . Hyperlipidemia   . Parkinsonism ([place])   . S/P cardiac cath 08/12/12  . Splenomegaly     Past Surgical History:  Procedure Laterality Date  . COLONOSCOPY  2004  . CORONARY ARTERY BYPASS GRAFT    . CORONARY ARTERY BYPASS GRAFT N/A 09/16/2012   Procedure: CORONARY ARTERY BYPASS GRAFTING (CABG) TIMES ONE USING LEFT INTERNAL MAMMARY ARTERY;  Surgeon: Gaye Pollack, MD;  Location: [facility];  Service: Open Heart Surgery;  Laterality: N/A;  . EYE SURGERY  2012   left cataract extraction  . INNER EAR SURGERY  1995   Dr. Sherre Lain...placement of shunt  . LEFT HEART CATHETERIZATION WITH CORONARY ANGIOGRAM N/A 08/19/2012   Procedure: LEFT HEART CATHETERIZATION WITH CORONARY ANGIOGRAM;  Surgeon: [name], MD;  [facility] C[facility] LAB;  Service: Cardiovascular;  Laterality: N/A;    Family History  Problem Relation Age of Onset  . Heart disease Father     died from  . Congestive Heart Failure Father   . Kidney disease Mother   . Stroke Brother     Social History:  reports that he quit smoking about 28 years ago. He has never used smokeless tobacco. He reports that he does not drink alcohol or use drugs.  Allergies: No Known Allergies  Medications:  Reviewed  Results for orders placed or performed during the hospital encounter of 06/29/16 (from the past 48 hour(s))  Glucose, capillary     Status: Abnormal   Collection Time: 07/08/16  4:23 PM  Result Value Ref Range   Glucose-Capillary 100 (H) 65 - 99 mg/dL   Comment 1 Notify RN    Comment 2 Document in Chart   Glucose, capillary     Status: Abnormal   Collection Time: 07/08/16  8:01 PM  Result Value Ref Range   Glucose-Capillary 121 (H) 65 - 99 mg/dL   Comment 1 Notify RN   Glucose, capillary     Status: Abnormal   Collection Time: 07/08/16 11:52 PM  Result Value Ref Range   Glucose-Capillary 131 (H) 65 - 99 mg/dL   Comment 1 Notify RN   Glucose, capillary     Status: Abnormal   Collection Time: 07/09/16  4:11 AM  Result Value Ref Range   Glucose-Capillary 123 (H) 65 - 99 mg/dL   Comment 1 Notify RN   Heparin level (unfractionated)     Status: None   Collection Time: 07/09/16  4:45 AM  Result Value Ref Range   Heparin Unfractionated 0.56 0.30 - 0.70 IU/mL    Comment:        IF HEPARIN RESULTS ARE BELOW EXPECTED VALUES, AND PATIENT DOSAGE HAS BEEN CONFIRMED, SUGGEST FOLLOW UP TESTING OF ANTITHROMBIN III LEVELS.  Magnesium     Status: Abnormal   Collection Time: 07/09/16  4:45 AM  Result Value Ref Range   Magnesium 2.6 (H) 1.7 - 2.4 mg/dL  CBC     Status: Abnormal   Collection Time: 07/09/16  4:45 AM  Result Value Ref Range   WBC 10.0 4.0 - 10.5 K/uL   RBC 3.38 (L) 4.22 - 5.81 MIL/uL   Hemoglobin 9.1 (L) 13.0 - 17.0 g/dL   HCT 30.5 (L) 39.0 - 52.0 %   MCV 90.2 78.0 - 100.0 fL   MCH 26.9 26.0 - 34.0 pg   MCHC 29.8 (L) 30.0 - 36.0 g/dL   RDW 17.5 (H) 11.5 - 15.5 %   Platelets 388 150 - 400 K/uL  Renal function panel     Status: Abnormal   Collection Time: 07/09/16  4:45 AM  Result Value Ref Range   Sodium 139 135 - 145 mmol/L   Potassium 4.2 3.5 - 5.1 mmol/L   Chloride 99 (L) 101 - 111 mmol/L   CO2 30 22 - 32 mmol/L   Glucose, Bld 117 (H) 65 - 99 mg/dL    BUN 39 (H) 6 - 20 mg/dL   Creatinine, Ser 1.03 0.61 - 1.24 mg/dL   Calcium 9.1 8.9 - 10.3 mg/dL   Phosphorus 3.9 2.5 - 4.6 mg/dL   Albumin 2.6 (L) 3.5 - 5.0 g/dL   GFR calc non Af Amer >60 >60 mL/min   GFR calc Af Amer >60 >60 mL/min    Comment: (NOTE) The eGFR has been calculated using the CKD EPI equation. This calculation has not been validated in all clinical situations. eGFR's persistently <60 mL/min signify possible Chronic Kidney Disease.    Anion gap 10 5 - 15  Glucose, capillary     Status: Abnormal   Collection Time: 07/09/16  7:40 AM  Result Value Ref Range   Glucose-Capillary 119 (H) 65 - 99 mg/dL   Comment 1 Notify RN    Comment 2 Document in Chart   TSH     Status: Abnormal   Collection Time: 07/09/16 10:39 AM  Result Value Ref Range   TSH 5.816 (H) 0.350 - 4.500 uIU/mL    Comment: Performed by a 3rd Generation assay with a functional sensitivity of <=0.01 uIU/mL.  T4, free     Status: Abnormal   Collection Time: 07/09/16 10:39 AM  Result Value Ref Range   Free T4 1.13 (H) 0.61 - 1.12 ng/dL    Comment: (NOTE) Biotin ingestion may interfere with free T4 tests. If the results are inconsistent with the TSH level, previous test results, or the clinical presentation, then consider biotin interference. If needed, order repeat testing after stopping biotin.   Glucose, capillary     Status: Abnormal   Collection Time: 07/09/16 12:28 PM  Result Value Ref Range   Glucose-Capillary 102 (H) 65 - 99 mg/dL   Comment 1 Notify RN    Comment 2 Document in Chart   Glucose, capillary     Status: Abnormal   Collection Time: 07/09/16  3:37 PM  Result Value Ref Range   Glucose-Capillary 111 (H) 65 - 99 mg/dL   Comment 1 Document in Chart   Glucose, capillary     Status: Abnormal   Collection Time: 07/09/16  7:31 PM  Result Value Ref Range   Glucose-Capillary 131 (H) 65 - 99 mg/dL   Comment 1 Notify RN   Glucose, capillary     Status: Abnormal   Collection Time: 07/09/16  11:14 PM  Result Value Ref Range   Glucose-Capillary 120 (H) 65 - 99 mg/dL   Comment 1 Notify RN   Heparin level (unfractionated)     Status: None   Collection Time: 07/10/16  3:54 AM  Result Value Ref Range   Heparin Unfractionated 0.40 0.30 - 0.70 IU/mL    Comment:        IF HEPARIN RESULTS ARE BELOW EXPECTED VALUES, AND PATIENT DOSAGE HAS BEEN CONFIRMED, SUGGEST FOLLOW UP TESTING OF ANTITHROMBIN III LEVELS.   Magnesium     Status: Abnormal   Collection Time: 07/10/16  3:54 AM  Result Value Ref Range   Magnesium 2.5 (H) 1.7 - 2.4 mg/dL  CBC     Status: Abnormal   Collection Time: 07/10/16  3:54 AM  Result Value Ref Range   WBC 9.6 4.0 - 10.5 K/uL   RBC 3.38 (L) 4.22 - 5.81 MIL/uL   Hemoglobin 9.6 (L) 13.0 - 17.0 g/dL   HCT 30.6 (L) 39.0 - 52.0 %   MCV 90.5 78.0 - 100.0 fL   MCH 28.4 26.0 - 34.0 pg   MCHC 31.4 30.0 - 36.0 g/dL   RDW 17.8 (H) 11.5 - 15.5 %   Platelets 414 (H) 150 - 400 K/uL  Renal function panel     Status: Abnormal   Collection Time: 07/10/16  3:54 AM  Result Value Ref Range   Sodium 139 135 - 145 mmol/L   Potassium 3.9 3.5 - 5.1 mmol/L   Chloride 99 (L) 101 - 111 mmol/L   CO2 30 22 - 32 mmol/L   Glucose, Bld 129 (H) 65 - 99 mg/dL   BUN 38 (H) 6 - 20 mg/dL   Creatinine, Ser 1.09 0.61 - 1.24 mg/dL   Calcium 9.2 8.9 - 10.3 mg/dL   Phosphorus 3.9 2.5 - 4.6 mg/dL   Albumin 2.6 (L) 3.5 - 5.0 g/dL   GFR calc non Af Amer >60 >60 mL/min   GFR calc Af Amer >60 >60 mL/min    Comment: (NOTE) The eGFR has been calculated using the CKD EPI equation. This calculation has not been validated in all clinical situations. eGFR's persistently <60 mL/min signify possible Chronic Kidney Disease.    Anion gap 10 5 - 15  Glucose, capillary     Status: Abnormal   Collection Time: 07/10/16  4:00 AM  Result Value Ref Range   Glucose-Capillary 126 (H) 65 - 99 mg/dL   Comment 1 Notify RN   Glucose, capillary     Status: Abnormal   Collection Time: 07/10/16  7:37 AM   Result Value Ref Range   Glucose-Capillary 118 (H) 65 - 99 mg/dL   Comment 1 Notify RN   Heparin level (unfractionated)     Status: None   Collection Time: 07/10/16 11:35 AM  Result Value Ref Range   Heparin Unfractionated 0.50 0.30 - 0.70 IU/mL    Comment:        IF HEPARIN RESULTS ARE BELOW EXPECTED VALUES, AND PATIENT DOSAGE HAS BEEN CONFIRMED, SUGGEST FOLLOW UP TESTING OF ANTITHROMBIN III LEVELS.   Glucose, capillary     Status: Abnormal   Collection Time: 07/10/16 12:07 PM  Result Value Ref Range   Glucose-Capillary 111 (H) 65 - 99 mg/dL   Comment 1 Notify RN    Comment 2 Document in Chart     Ct Head Wo Contrast  Result Date: 07/08/2016 CLINICAL DATA:  Patient with confusion and hypotension. EXAM: CT HEAD WITHOUT CONTRAST TECHNIQUE: Contiguous axial  images were obtained from the base of the skull through the vertex without intravenous contrast. COMPARISON:  MRI brain 06/08/2016 ; CT brain 06/07/2016. FINDINGS: Brain: Ventricles and sulci are appropriate for patient's age. Evolving infarct within the right thalamus and internal capsule. Additional evolving infarct within the right periatrial white matter. No evidence for intracranial hemorrhage, mass lesion or mass-effect. Vascular: No hyperdense vessel or unexpected calcification. Skull: Normal. Negative for fracture or focal lesion. Sinuses/Orbits: Opacification of the left maxillary sinus. Post mastoidectomy on the left. Other: None. IMPRESSION: Evolving infarcts within the right thalamus and internal capsule as well as periatrial right matter. No intracranial hemorrhage or significant mass effect. Electronically Signed   By: Lovey Newcomer M.D.   On: 07/08/2016 16:35    SMO:[id number] except as listed in admit H&P  Blood pressure (!) 120/54, pulse 60, temperature 98.5 F (36.9 C), temperature source Oral, resp. rate 16, weight 87.6 kg (193 lb 2 oz), SpO2 100 %.  PHYSICAL EXAM: Overall appearance:  Orally intubated with gentle  restraints on extremities, able to answer questions by knotting. Head:  Normocephalic, atraumatic. Ears: External ears look healthy. Nose: External nose is healthy in appearance. Internal nasal exam free of any lesions or obstruction. Oral Cavity/Pharynx:  There are no mucosal lesions or masses identified. Endotracheal tube in place. Larynx/Hypopharynx: Deferred Neuro:  Not evaluated. Neck: No palpable neck masses.  Studies Reviewed: none  Procedures: none   Assessment/Plan: Agree with need for tracheostomy. I will discuss this with the relatives and we will schedule at the next available time.  Gregory [name] 07/10/2016, 12:47 PM
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
## 2016-07-11 NOTE — Anesthesia Postprocedure Evaluation (Addendum)
Anesthesia Post Note  Patient: Gregory [name]  Procedure(s) Performed: Procedure(s) (LRB): TRACHEOSTOMY (N/A)  Patient location during evaluation: SICU Anesthesia Type: General Level of consciousness: sedated Pain management: pain level controlled Vital Signs Assessment: post-procedure vital signs reviewed and stable Respiratory status: patient remains intubated per anesthesia plan Cardiovascular status: stable Anesthetic complications: no       Last Vitals:  Vitals:   07/11/16 1200 07/11/16 1230  BP: 136/70   Pulse: 67 62  Resp: 16 19  Temp:      Last Pain:  Vitals:   07/11/16 1121  TempSrc: Oral                 Man Effertz DANIEL

## 2016-07-11 NOTE — Transfer of Care (Signed)
Immediate Anesthesia Transfer of Care Note  Patient: Gregory [name]  Procedure(s) Performed: Procedure(s): TRACHEOSTOMY (N/A)  Patient Location: ICU  Anesthesia Type:General  Level of Consciousness: Patient remains intubated per anesthesia plan  Airway & Oxygen Therapy: Patient remains intubated per anesthesia plan and Patient placed on Ventilator (see vital sign flow sheet for setting)  Post-op Assessment: Report given to RN and Post -op Vital signs reviewed and stable  Post vital signs: Reviewed and stable  Last Vitals:  Vitals:   07/11/16 1200 07/11/16 1230  BP: 136/70   Pulse: 67 62  Resp: 16 19  Temp:      Last Pain:  Vitals:   07/11/16 1121  TempSrc: Oral         Complications: No apparent anesthesia complications

## 2016-07-11 NOTE — Progress Notes (Addendum)
Pharmacy Antibiotic Note  Gregory [name] is a 76 y.o. male admitted on 06/29/2016 with MSSA pneumonia. Patient was previously treated for MSSA pneumonia with 8 days of antibiotics. Pharmacy consulted to restart cefazolin due to repeat trach aspirate on 2/16 with staph aureus. Patient is afebrile and WBC is 10.2. Patient is also on heparin gtt for atrial fibrillation and CVA, heparin was held this AM for patient to have trach placed in the OR. Heparin level was 0.50 (therapeutic), will resume heparin gtt tonight at 1800 at same rate and check 8 hour level  Plan: - Continue cefazolin 1g IV q8h - Monitor C&S, vital signs and clinical progression - Follow up length of therapy - Heparin 1650 units/hr (to start at 1800) - Check 8 hour heparin level - Monitor daily heparin level, CBC and signs/symptoms of bleeding  Weight: 195 lb 1.7 oz (88.5 kg)  Temp (24hrs), Avg:98.4 F (36.9 C), Min:97.7 F (36.5 C), Max:98.7 F (37.1 C)   Recent Labs Lab 07/06/16 0500 07/07/16 0501 07/08/16 0557 07/09/16 0445 07/10/16 0354 07/11/16 0355  WBC 7.7 12.5*  --  10.0 9.6 10.2  CREATININE 1.05 1.22 1.16 1.03 1.09 1.12    Estimated Creatinine Clearance: 59.8 mL/min (by C-G formula based on SCr of 1.12 mg/dL).    Allergies  Allergen Reactions  . No Known Allergies     Antimicrobials this admission:  Vancomycin 2/9 >> 2/12 Zosyn 2/9 >> 2/12 Ancef 2/12 >> 2/16  Microbiology results: 2/9 Flu negative 2/9 MRSA PCR >>POS 2/9 Sputum >>MSSA (S-Ancef) 2/16 Trach asp >> MSSA (S- Ancef) --- potential colonizer  Thank you for allowing pharmacy to be a part of this patient's care.  Casilda Carlsaylor Del Overfelt, PharmD, BCPS PGY-2 Infectious Diseases Pharmacy Resident Pager: [phone] 07/11/2016 3:41 PM

## 2016-07-11 NOTE — Progress Notes (Signed)
[place] changed, no complications noted.

## 2016-07-11 NOTE — Interval H&P Note (Signed)
History and Physical Interval Note:  07/11/2016 12:41 PM  Gregory [name]  has presented today for surgery, with the diagnosis of RESPRITORY FAILURE  The various methods of treatment have been discussed with the patient and family. After consideration of risks, benefits and other options for treatment, the patient has consented to  Procedure(s): TRACHEOSTOMY (N/A) as a surgical intervention .  The patient's history has been reviewed, patient examined, no change in status, stable for surgery.  I have reviewed the patient's chart and labs.  Questions were answered to the patient's satisfaction.     [name]

## 2016-07-11 NOTE — Anesthesia Preprocedure Evaluation (Addendum)
Anesthesia Evaluation  Patient identified by MRN, date of birth, ID band Patient unresponsive    Reviewed: Allergy & Precautions, H&P , NPO status , Patient's Chart, lab work & pertinent test results, Unable to perform ROS - Chart review only  History of Anesthesia Complications Negative for: history of anesthetic complications  Airway Mallampati: Intubated  TM Distance: >3 FB Neck ROM: full    Dental no notable dental hx.    Pulmonary former smoker,    Pulmonary exam normal        Cardiovascular + CAD and + Peripheral Vascular Disease  + dysrhythmias Atrial Fibrillation  Rhythm:irregular Rate:Normal     Neuro/Psych Parkinson's negative psych ROS   GI/Hepatic GERD  ,  Endo/Other    Renal/GU      Musculoskeletal   Abdominal   Peds  Hematology  (+) Blood dyscrasia, ,   Anesthesia Other Findings   Reproductive/Obstetrics                            Anesthesia Physical  Anesthesia Plan  ASA: III  Anesthesia Plan: General   Post-op Pain Management:    Induction: Intravenous  Airway Management Planned: Oral ETT  Additional Equipment: Arterial line, CVP and PA Cath  Intra-op Plan:   Post-operative Plan: Extubation in OR and Post-operative intubation/ventilation  Informed Consent: I have reviewed the patients History and Physical, chart, labs and discussed the procedure including the risks, benefits and alternatives for the proposed anesthesia with the patient or authorized representative who has indicated his/her understanding and acceptance.     Plan Discussed with: CRNA, Anesthesiologist and Surgeon  Anesthesia Plan Comments:         Anesthesia Quick Evaluation

## 2016-07-11 NOTE — Progress Notes (Signed)
Patient transferred to OR via bed on monitor Family at bedside.

## 2016-07-11 NOTE — Op Note (Signed)
07/11/2016 12:54 PM   PATIENT:  Gregory [name], 76 y.o. male  PRE-OPERATIVE DIAGNOSIS:  RESPRITORY FAILURE  POST-OPERATIVE DIAGNOSIS:  * No post-op diagnosis entered *   PROCEDURE:  Procedure(s): TRACHEOSTOMY  SURGEON:  Surgeon(s): Susy FrizzleJefry H Chrystina Naff, MD  ASSISTANTS: none   ANESTHESIA:   general  EBL: Minimal   DRAINS: none   LOCAL MEDICATIONS USED:  NONE  COUNTS CORRECT:  YES  PROCEDURE DETAILS: Patient was taken to the operating room and placed on the operating table in the supine position. A shoulder roll was placed for positioning. The patient was previously orally intubated. The neck was prepped and draped in a standard fashion. A vertical incision was created just above the sternal notch using electrocautery. The midline fascia was divided. The isthmus of the thyroid was divided using electrocautery and the upper trachea was exposed. A tracheotomy was created between the second and third tracheal rings in a horizontal fashion. A lower tracheal flap was created with scissors and the flap was sutured to the cervical skin using 2-0 chromic suture. The orotracheal tube was removed. The #8 Shiley tracheostomy tube was placed without difficulty and the cuff was inflated. The shield was secured to the neck using a Velcro straps and nylon suture. The patient was then transferred back to the intensive care unit in critical condition.  PLAN OF CARE: Transfer to ICU  PATIENT DISPOSITION:  ICU - hemodynamically stable.

## 2016-07-12 ENCOUNTER — Inpatient Hospital Stay (HOSPITAL_COMMUNITY): Payer: Medicare Other

## 2016-07-12 LAB — RENAL FUNCTION PANEL
ANION GAP: 13 (ref 5–15)
Albumin: 2.7 g/dL — ABNORMAL LOW (ref 3.5–5.0)
BUN: 43 mg/dL — AB (ref 6–20)
CALCIUM: 9 mg/dL (ref 8.9–10.3)
CO2: 29 mmol/L (ref 22–32)
Chloride: 97 mmol/L — ABNORMAL LOW (ref 101–111)
Creatinine, Ser: 1.29 mg/dL — ABNORMAL HIGH (ref 0.61–1.24)
GFR calc Af Amer: >60 mL/min (ref >60)
GFR calc non Af Amer: 52 mL/min — ABNORMAL LOW (ref >60)
GLUCOSE: 135 mg/dL — AB (ref 65–99)
PHOSPHORUS: 4 mg/dL (ref 2.5–4.6)
Potassium: 3.6 mmol/L (ref 3.5–5.1)
SODIUM: 139 mmol/L (ref 135–145)

## 2016-07-12 LAB — CBC
HCT: 32.7 % — ABNORMAL LOW (ref 39.0–52.0)
Hemoglobin: 10 g/dL — ABNORMAL LOW (ref 13.0–17.0)
MCH: 27.7 pg (ref 26.0–34.0)
MCHC: 30.6 g/dL (ref 30.0–36.0)
MCV: 90.6 fL (ref 78.0–100.0)
PLATELETS: 456 10*3/uL — AB (ref 150–400)
RBC: 3.61 MIL/uL — AB (ref 4.22–5.81)
RDW: 17.8 % — ABNORMAL HIGH (ref 11.5–15.5)
WBC: 11.7 10*3/uL — ABNORMAL HIGH (ref 4.0–10.5)

## 2016-07-12 LAB — GLUCOSE, CAPILLARY
GLUCOSE-CAPILLARY: 135 mg/dL — AB (ref 65–99)
GLUCOSE-CAPILLARY: 120 mg/dL — AB (ref 65–99)
GLUCOSE-CAPILLARY: 120 mg/dL — AB (ref 65–99)
GLUCOSE-CAPILLARY: 126 mg/dL — AB (ref 65–99)
Glucose-Capillary: 140 mg/dL — ABNORMAL HIGH (ref 65–99)
Glucose-Capillary: 131 mg/dL — ABNORMAL HIGH (ref 65–99)

## 2016-07-12 LAB — HEPARIN LEVEL (UNFRACTIONATED)
Heparin Unfractionated: 0.47 IU/mL (ref 0.30–0.70)
Heparin Unfractionated: 0.4 IU/mL (ref 0.30–0.70)

## 2016-07-12 LAB — HEMOGLOBIN AND HEMATOCRIT, BLOOD
HCT: 32.9 % — ABNORMAL LOW (ref 39.0–52.0)
Hemoglobin: 10.1 g/dL — ABNORMAL LOW (ref 13.0–17.0)

## 2016-07-12 LAB — PROCALCITONIN

## 2016-07-12 LAB — MAGNESIUM: MAGNESIUM: 2.5 mg/dL — AB (ref 1.7–2.4)

## 2016-07-12 MED ORDER — HEPARIN SODIUM (PORCINE) 5000 UNIT/ML IJ SOLN
5000.0000 [IU] | Freq: Three times a day (TID) | INTRAMUSCULAR | Status: DC
  Administered 2016-07-12 – 2016-07-19 (×19): 5000 [IU] via SUBCUTANEOUS
  Filled 2016-07-12 (×17): qty 1

## 2016-07-12 MED ORDER — APIXABAN 5 MG PO TABS
5.0000 mg | ORAL_TABLET | Freq: Two times a day (BID) | ORAL | Status: DC
  Administered 2016-07-12: 5 mg via ORAL
  Filled 2016-07-12 (×2): qty 1

## 2016-07-12 MED ORDER — "THROMBI-PAD 3""X3"" EX PADS"
1.0000 | MEDICATED_PAD | Freq: Once | CUTANEOUS | Status: DC
  Filled 2016-07-12: qty 1

## 2016-07-12 MED ORDER — POLYETHYLENE GLYCOL 3350 17 G PO PACK
17.0000 g | PACK | Freq: Every day | ORAL | Status: DC | PRN
  Administered 2016-07-14 – 2016-07-15 (×2): 17 g via ORAL
  Filled 2016-07-12 (×3): qty 1
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
## 2016-07-12 NOTE — Progress Notes (Signed)
Patient having worsening bleeding from trach.  Made Dr. Isaiah SergeMannam aware.  He assessed the patient.  Verbal order for nursing to hold Eliquis for the time being.  If bleeding worsens, call E-link for orders.

## 2016-07-12 NOTE — Progress Notes (Signed)
eLink Physician-Brief Progress Note Patient Name: Gregory LoronRichard Stanczyk DOB: 05-02-1941 MRN: [id number]   Date of Service  07/12/2016  HPI/Events of Note  Bleeding from trach  eICU Interventions  Thrombi pad      Intervention Category Major Interventions: Other:  Zanita Millman 07/12/2016, 5:13 PM
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
## 2016-07-12 NOTE — Procedures (Signed)
Pt placed on ATC at this time tolerating well, states he feels he can breathe easier, RN aware, daughter at bedside, RT will monitor

## 2016-07-12 NOTE — Progress Notes (Signed)
Noted pt received trach yesterday with vent weaning now. Please order PT, OT, and SLP to assist with planning disposition. I would like to pursue readmit to inpt rehab when medically appropriate if pt able to participate in inpt rehab as he did before. I will contact RN CM to discuss. [phone]
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
## 2016-07-12 NOTE — Procedures (Signed)
Pt placed on PS trail at this time, tolerating well.  If pt continues to tolerate will advance to ATC trial later this am. Rt will monitor

## 2016-07-13 ENCOUNTER — Encounter: Payer: Self-pay | Admitting: Vascular Surgery

## 2016-07-13 LAB — CBC
HEMATOCRIT: 33.8 % — AB (ref 39.0–52.0)
HEMOGLOBIN: 10.1 g/dL — AB (ref 13.0–17.0)
MCH: 27.4 pg (ref 26.0–34.0)
MCHC: 29.9 g/dL — ABNORMAL LOW (ref 30.0–36.0)
MCV: 91.8 fL (ref 78.0–100.0)
Platelets: 423 10*3/uL — ABNORMAL HIGH (ref 150–400)
RBC: 3.68 MIL/uL — AB (ref 4.22–5.81)
RDW: 17.8 % — AB (ref 11.5–15.5)
WBC: 12.4 10*3/uL — AB (ref 4.0–10.5)

## 2016-07-13 LAB — GLUCOSE, CAPILLARY
GLUCOSE-CAPILLARY: 130 mg/dL — AB (ref 65–99)
Glucose-Capillary: 145 mg/dL — ABNORMAL HIGH (ref 65–99)
Glucose-Capillary: 123 mg/dL — ABNORMAL HIGH (ref 65–99)
Glucose-Capillary: 101 mg/dL — ABNORMAL HIGH (ref 65–99)
Glucose-Capillary: 107 mg/dL — ABNORMAL HIGH (ref 65–99)

## 2016-07-13 LAB — RENAL FUNCTION PANEL
ANION GAP: 10 (ref 5–15)
Albumin: 2.8 g/dL — ABNORMAL LOW (ref 3.5–5.0)
BUN: 40 mg/dL — ABNORMAL HIGH (ref 6–20)
CALCIUM: 9.2 mg/dL (ref 8.9–10.3)
CHLORIDE: 100 mmol/L — AB (ref 101–111)
CO2: 30 mmol/L (ref 22–32)
Creatinine, Ser: 0.99 mg/dL (ref 0.61–1.24)
GFR calc non Af Amer: >60 mL/min (ref >60)
Glucose, Bld: 119 mg/dL — ABNORMAL HIGH (ref 65–99)
POTASSIUM: 4.1 mmol/L (ref 3.5–5.1)
Phosphorus: 3.8 mg/dL (ref 2.5–4.6)
Sodium: 140 mmol/L (ref 135–145)

## 2016-07-13 LAB — MAGNESIUM: Magnesium: 2.5 mg/dL — ABNORMAL HIGH (ref 1.7–2.4)

## 2016-07-13 LAB — PROCALCITONIN: PROCALCITONIN: 0.12 ng/mL

## 2016-07-13 MED ORDER — PANTOPRAZOLE SODIUM 40 MG PO PACK
40.0000 mg | PACK | Freq: Every day | ORAL | Status: DC
  Administered 2016-07-14 – 2016-07-19 (×6): 40 mg
  Filled 2016-07-13 (×6): qty 20

## 2016-07-13 MED ORDER — ASPIRIN 81 MG PO CHEW: 81.0000 mg | CHEWABLE_TABLET | Freq: Every day | ORAL | Status: DC

## 2016-07-13 MED ORDER — FUROSEMIDE 40 MG PO TABS
40.0000 mg | ORAL_TABLET | Freq: Every day | ORAL | Status: DC
  Administered 2016-07-13 – 2016-07-16 (×4): 40 mg via ORAL
  Filled 2016-07-13 (×4): qty 1

## 2016-07-13 MED ORDER — ASPIRIN 81 MG PO CHEW
81.0000 mg | CHEWABLE_TABLET | Freq: Every day | ORAL | Status: DC
  Administered 2016-07-14 – 2016-07-19 (×6): 81 mg
  Filled 2016-07-13 (×6): qty 1
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
## 2016-07-13 NOTE — Progress Notes (Signed)
OT Cancellation Note  Patient Details Name: Gregory [name] MRN: [id number] DOB: [birth year]   Cancelled Treatment:    Reason Eval/Treat Not Completed: Patient not medically ready (active bedrest orders). Please d/c bedrest order and update activity orders for initiation of therapy services as appropriate. Will follow up for OT eval as time allows.  Gaye AlkenBailey A Jarmar Rousseau M.S., OTR/L Pager: [phone]  07/13/2016, 7:35 AM

## 2016-07-13 NOTE — Progress Notes (Signed)
PT Cancellation Note  Patient Details Name: Gregory [name] MRN: [id number] DOB: 1940-09-15   Cancelled Treatment:    Reason Eval/Treat Not Completed: Patient not medically ready.  Pt currently on bedrest.  Please advance activity order once appropriate for PT and mobility.     [name], [place] CarolinaPT [phone] 07/13/2016, 8:11 AM
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
## 2016-07-13 NOTE — Progress Notes (Signed)
eLink Physician-Brief Progress Note Patient Name: Gregory [name] DOB: 03-Nov-1940 MRN: [id number]   Date of Service  07/13/2016  HPI/Events of Note  Agitation - request to renew restraints.  eICU Interventions  Will renew restraints.      Intervention Category Minor Interventions: Agitation / anxiety - evaluation and management  Sommer,Steven Dennard Nipugene 07/13/2016, 8:11 PM

## 2016-07-13 NOTE — Progress Notes (Signed)
eLink Physician-Brief Progress Note Patient Name: Emelia LoronRichard Taketa DOB: 07/01/1940 MRN: [id number]   Date of Service  07/13/2016  HPI/Events of Note  Multiple issues: 1. Oliguria - bladder scan with 230 mL residual, and 2. Change ASA from PO to per tube.   eICU Interventions  Will order: 1. Change ASA from PO to per tube.  2. Place Foley catheter.      Intervention Category Intermediate Interventions: Oliguria - evaluation and management  Sommer,Steven Eugene 07/13/2016, 7:11 PM
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
## 2016-07-13 NOTE — Progress Notes (Signed)
Subjective:  He is alert and in spite of tracheal collar, is able to verbalize early, states that his appetite is improved and would like to start eating.  Daughter present at the bedside.  Objective:  Vital Signs in the last 24 hours: Temp:  [97.6 F (36.4 C)-99.5 F (37.5 C)] 97.6 F (36.4 C) (02/23 1728) Pulse Rate:  [48-84] 80 (02/23 1728) Resp:  [15-26] 18 (02/23 1728) BP: (94-134)/(52-99) 125/69 (02/23 1728) SpO2:  [99 %-100 %] 100 % (02/23 1728) FiO2 (%):  [28 %-30 %] 28 % (02/23 1728) Weight:  [186 lb 4.6 oz (84.5 kg)] 186 lb 4.6 oz (84.5 kg) (02/23 0600)  Intake/Output from previous day: 02/22 0701 - 02/23 0700 In: 2116.5 [I.V.:256.5; NG/GT:1710; IV Piggyback:150] Out: 2770 [Urine:2770]  Physical Exam:  General appearance: alert, cooperative, appears stated age and fatigued Eyes: negative findings: lids and lashes normal Neck: tracheostomy present..  Difficult to make or JVD. Neck: JVP - normal, carotids 2+= without bruits Resp: clear to auscultation bilaterally Chest wall: no tenderness Cardio: regular rate and rhythm, S1, S2 normal, no murmur, click, rub or gallop and Frequent ectopy present GI: soft, non-tender; bowel sounds normal; no masses,  no organomegaly Extremities: extremities normal, atraumatic, no cyanosis or edema    Lab Results: BMP  Recent Labs  07/11/16 0355 07/12/16 0330 07/13/16 0620  NA 137 139 140  K 4.2 3.6 4.1  CL 97* 97* 100*  CO2 '31 29 30  ' GLUCOSE 119* 135* 119*  BUN 40* 43* 40*  CREATININE 1.12 1.29* 0.99  CALCIUM 9.2 9.0 9.2  GFRNONAA >60 52* >60  GFRAA >60 >60 >60    CBC  Recent Labs Lab 07/13/16 0620  WBC 12.4*  RBC 3.68*  HGB 10.1*  HCT 33.8*  PLT 423*  MCV 91.8  MCH 27.4  MCHC 29.9*  RDW 17.8*    HEMOGLOBIN A1C Lab Results  Component Value Date   HGBA1C 4.8 06/08/2016   MPG 91 06/08/2016    Cardiac Panel (last 3 results)  Recent Labs  06/18/16 0919 06/18/16 1435 06/18/16 2031  TROPONINI <0.03  0.03* 0.03*    TSH  Recent Labs  06/17/16 1429 07/09/16 1039  TSH 6.936* 5.816*    Hepatic Function Panel  Recent Labs  06/04/16 0338 06/19/16 0238  06/21/16 0507  07/11/16 0355 07/12/16 0330 07/13/16 0620  PROT 5.9* 7.2  --  6.6  --   --   --   --   ALBUMIN 2.9* 3.0*  < > 2.9*  < > 2.9* 2.7* 2.8*  AST 48* 22  --  23  --   --   --   --   ALT 7* 8*  --  13*  --   --   --   --   ALKPHOS 52 145*  --  128*  --   --   --   --   BILITOT 1.5* 1.3*  --  0.9  --   --   --   --   < > = values in this interval not displayed.  Imaging: Imaging results have been reviewed.  Scheduled Meds: . carbidopa-levodopa  1.5 tablet Per Tube QID  .  ceFAZolin (ANCEF) IV  1 g Intravenous Q8H  . chlorhexidine gluconate (MEDLINE KIT)  15 mL Mouth Rinse BID  . Chlorhexidine Gluconate Cloth  6 each Topical Daily  . furosemide  40 mg Oral Daily  . guaiFENesin  20 mL Per Tube Q8H  . heparin  5,000 Units  Subcutaneous Q8H  . mouth rinse  15 mL Mouth Rinse QID  . [START ON 07/14/2016] pantoprazole sodium  40 mg Per Tube Daily  . sodium chloride flush  10-40 mL Intracatheter Q12H  . THROMBI-PAD  1 each Topical Once   Continuous Infusions: . sodium chloride 10 mL/hr at 07/13/16 0700  . feeding supplement (VITAL AF 1.2 CAL) 1,000 mL (07/13/16 0700)   PRN Meds:.acetaminophen (TYLENOL) oral liquid 160 mg/5 mL, fentaNYL (SUBLIMAZE) injection, midazolam, polyethylene glycol, sennosides, sodium chloride flush   Cardiac Studies:  2D Echocardiogram /19/2018: - Left ventricle: The cavity size was normal. There was severeconcentric hypertrophy. Systolic function was vigorous. Theestimated ejection fraction was in the range of 65% to 70%. Wallmotion was normal; there were no regional wall motionabnormalities. Features are consistent with a pseudonormal leftventricular filling pattern, with concomitant abnormal relaxationand increased filling pressure (grade 2 diastolic dysfunction). - Left atrium:  The atrium was mildly dilated. - Tricuspid valve: There was mild regurgitation. - Pulmonary arteries: Systolic pressure was mildly increased. PApeak pressure: 39 mm Hg (S). Impressions: No cardiac source of emboli was indentified.  EKG 06/11/2016:Atrial fibrillation with controlled ventricular response at the rate of 81 bpm, left axis deviation, no evidence of ischemia. Normal QT interval. Compared to 06/01/2016, wandering atrial pacemaker has been replaced by atrial fibrillation.  Telemetry: Review of the telemetry strips were the past 48-72 hours reveals no recurrence of atrial fibrillation.  Patient has sinus rhythm with frequent PACs.  Occasional episodes of sinus arrest, maximum pause 2.2 seconds.   Assessment/Plan:  1. Paroxysmal atrial fibrillation,  back to sinus rhythm presently on IV heparin, no recurrence of hematurea. CHA2DS2-VASCScore: Risk Score 6, Yearly risk of stroke 9.8. Recommendation: Anticoagulation. 2. Tracheostomy bleeding.  Has stopped since discontinuing Eliquis. 3.  Symptomatic right carotid artery stenosis, high-grade will eventually need endarterectomy. 4. Parkinson's disease 5.  Coronary artery disease, history of CABG. 6.  Abnormal TSH  Recommendation: From cardiac standpoint I recommended that he not be on anticoagulation Due to risk of  Aspiration of heme and development of pneumonia and worsening respiratory status.  Fortunately he has maintained sinus rhythm. I will restart aspirin 325 mg daily.  He is presently on subcutaneous heparin for DVT prophylaxis along with SCD.  Otherwise he remains stable from cardiac standpoint.  Once tracheostomy has healed well and there is no further bleeding, the could certainly consider either restarting Eliquis at that time or adding Plavix for recent stroke and high-grade carotid artery stenosis. I have discussed my recommendation with the care team.   Adrian Prows, M.D. 07/13/2016, 5:56 PM [facility]  [place] Pager: [phone] Office: [phone] If no answer: [phone]
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
## 2016-07-13 NOTE — Progress Notes (Signed)
I have spoken with pt's daughter, Brayton ElBritney, by phone per her and her Mom's request. They wish for me to pursue an inpt rehab admission with [facility] once pt able to participate in therapies and is medically appropriate. I will follow up Monday. [phone]

## 2016-07-14 LAB — RENAL FUNCTION PANEL
Albumin: 2.8 g/dL — ABNORMAL LOW (ref 3.5–5.0)
Anion gap: 11 (ref 5–15)
BUN: 38 mg/dL — AB (ref 6–20)
CHLORIDE: 101 mmol/L (ref 101–111)
CO2: 29 mmol/L (ref 22–32)
CREATININE: 0.88 mg/dL (ref 0.61–1.24)
Calcium: 9.5 mg/dL (ref 8.9–10.3)
GFR calc Af Amer: >60 mL/min (ref >60)
GFR calc non Af Amer: >60 mL/min (ref >60)
GLUCOSE: 116 mg/dL — AB (ref 65–99)
Phosphorus: 3.9 mg/dL (ref 2.5–4.6)
Potassium: 4.3 mmol/L (ref 3.5–5.1)
Sodium: 141 mmol/L (ref 135–145)

## 2016-07-14 LAB — GLUCOSE, CAPILLARY
GLUCOSE-CAPILLARY: 115 mg/dL — AB (ref 65–99)
GLUCOSE-CAPILLARY: 116 mg/dL — AB (ref 65–99)
GLUCOSE-CAPILLARY: 100 mg/dL — AB (ref 65–99)
Glucose-Capillary: 108 mg/dL — ABNORMAL HIGH (ref 65–99)
Glucose-Capillary: 112 mg/dL — ABNORMAL HIGH (ref 65–99)
Glucose-Capillary: 114 mg/dL — ABNORMAL HIGH (ref 65–99)

## 2016-07-14 LAB — PROCALCITONIN

## 2016-07-14 LAB — MAGNESIUM: Magnesium: 2.4 mg/dL (ref 1.7–2.4)

## 2016-07-14 MED ORDER — NEOMYCIN-POLYMYXIN-HC 3.5-10000-1 OT SUSP
4.0000 [drp] | Freq: Four times a day (QID) | OTIC | Status: DC
  Administered 2016-07-14 – 2016-07-19 (×22): 4 [drp] via OTIC
  Filled 2016-07-14 (×2): qty 10

## 2016-07-14 MED ORDER — HYDROCORTISONE-ACETIC ACID 1-2 % OT SOLN
2.0000 [drp] | Freq: Four times a day (QID) | OTIC | Status: DC
  Filled 2016-07-14: qty 10

## 2016-07-14 MED ORDER — CHLORHEXIDINE GLUCONATE 0.12 % MT SOLN
OROMUCOSAL | Status: AC
  Filled 2016-07-14: qty 15

## 2016-07-14 NOTE — Progress Notes (Signed)
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  Location: [facility] CATH LAB;  Service: Cardiovascular;  Laterality: N/A; HPI: See H&P Subjective: pt alert but confused Assessment / Plan / Recommendation CHL IP CLINICAL IMPRESSIONS 06/26/2016 Therapy Diagnosis Moderate oral phase dysphagia Clinical Impression Patient has a moderate pharyngeal dysphagia due to sensory but predominantly motor deficits. Patient demonstrates a delayed swallow initiation to the valleculae with decreased base of tongue retraction, decreased epiglottic inversion, and decreased hyolaryngeal excursion and elevation resulting in diffuse pharyngeal residue with all consistencies tested. Patient required 3-4 swallows to clear residuals with eventual penetration with all consistencies and trace aspiration of residue down posterior tracheal wall with thin liquids, especially as study continued and patient demonstrated increased fatigue and decreased endurance. Compensatory strategies were unsuccessful, suspect due to fatigue and decreased ability to follow commands. Recommend patient remain NPO with temporary means of nutrition but  initiate the ice chip protocol and focus on trials of nectar-thick liquids via tsp with  SLP along with pharyngeal strengthening exercises. Prognosis for PO intake is good, especially as cognition improves. Thorough education provided to patient and daughter at bedside.  Impact on safety and function Moderate aspiration risk   CHL IP TREATMENT RECOMMENDATION 06/26/2016 Treatment Recommendations Therapy as outlined in treatment plan below   Prognosis 06/26/2016 Prognosis for Safe Diet Advancement Good Barriers to Reach Goals Cognitive deficits Barriers/Prognosis Comment -- CHL IP DIET RECOMMENDATION 06/26/2016 SLP Diet Recommendations NPO;Alternative means - temporary Liquid Administration via -- Medication Administration Via alternative means Compensations -- Postural Changes --   CHL IP OTHER RECOMMENDATIONS 06/26/2016 Recommended Consults -- Oral Care Recommendations Oral care QID Other Recommendations --   CHL IP FOLLOW UP RECOMMENDATIONS 06/18/2016 Follow up Recommendations Inpatient Rehab   CHL IP FREQUENCY AND DURATION 06/26/2016 Speech Therapy Frequency (ACUTE ONLY) min 5x/week Treatment Duration 3 weeks      CHL IP ORAL PHASE 06/26/2016 Oral Phase Impaired Oral - Pudding Teaspoon -- Oral - Pudding Cup -- Oral - Honey Teaspoon WFL Oral - Honey Cup -- Oral - Nectar Teaspoon Left anterior bolus loss;Right anterior bolus loss;Decreased bolus cohesion Oral - Nectar Cup Left anterior bolus loss;Right anterior bolus loss;Decreased bolus cohesion Oral - Nectar Straw -- Oral - Thin Teaspoon Left anterior bolus loss;Right anterior bolus loss Oral - Thin Cup -- Oral - Thin Straw -- Oral - Puree -- Oral - Mech Soft -- Oral - Regular -- Oral - Multi-Consistency -- Oral - Pill -- Oral Phase - Comment --  CHL IP PHARYNGEAL PHASE 06/26/2016 Pharyngeal Phase Impaired Pharyngeal- Pudding Teaspoon -- Pharyngeal -- Pharyngeal- Pudding Cup -- Pharyngeal -- Pharyngeal- Honey Teaspoon Delayed swallow initiation-vallecula;Reduced epiglottic  inversion;Reduced airway/laryngeal closure;Reduced tongue base retraction;Reduced anterior laryngeal mobility;Reduced laryngeal elevation;Penetration/Apiration after swallow;Pharyngeal residue - pyriform;Pharyngeal residue - valleculae;Pharyngeal residue - cp segment;Lateral channel residue Pharyngeal Material enters airway, CONTACTS cords and then ejected out Pharyngeal- Honey Cup -- Pharyngeal -- Pharyngeal- Nectar Teaspoon Delayed swallow initiation-vallecula;Reduced epiglottic inversion;Reduced anterior laryngeal mobility;Reduced laryngeal elevation;Reduced airway/laryngeal closure;Reduced tongue base retraction;Penetration/Apiration after swallow;Pharyngeal residue - cp segment;Lateral channel residue;Pharyngeal residue - pyriform;Pharyngeal residue - valleculae Pharyngeal Material enters airway, remains ABOVE vocal cords and not ejected out Pharyngeal- Nectar Cup Delayed swallow initiation-vallecula;Penetration/Apiration after swallow;Pharyngeal residue - valleculae;Pharyngeal residue - pyriform;Lateral channel residue;Trace aspiration;Reduced anterior laryngeal mobility;Reduced laryngeal elevation;Reduced epiglottic inversion;Reduced airway/laryngeal closure Pharyngeal Material enters airway, remains ABOVE vocal cords and not ejected out Pharyngeal- Nectar Straw -- Pharyngeal -- Pharyngeal- Thin Teaspoon Delayed swallow initiation-vallecula;Reduced airway/laryngeal closure;Reduced tongue base retraction;Reduced laryngeal elevation;Reduced anterior laryngeal mobility;Reduced epiglottic inversion;Penetration/Apiration after swallow;Pharyngeal residue - valleculae;Pharyngeal residue - pyriform;Lateral channel residue;Pharyngeal residue - cp segment;Trace aspiration Pharyngeal Material enters airway, passes BELOW cords without attempt by patient to eject out (silent aspiration) Pharyngeal- Thin Cup -- Pharyngeal -- Pharyngeal- Thin Straw -- Pharyngeal -- Pharyngeal- Puree NT Pharyngeal -- Pharyngeal- Mechanical  Soft -- Pharyngeal -- Pharyngeal- Regular -- Pharyngeal -- Pharyngeal- Multi-consistency -- Pharyngeal -- Pharyngeal- Pill -- Pharyngeal -- Pharyngeal Comment --  No flowsheet data found. PAYNE, Florien 06/26/2016, 3:28 PM               Lab Data:  CBC:  Recent Labs Lab 07/09/16 0445 07/10/16 0354 07/11/16 0355 07/12/16 0330 07/12/16 2311 07/13/16 0620  WBC 10.0 9.6 10.2 11.7*  --  12.4*  HGB 9.1* 9.6* 9.9* 10.0* 10.1* 10.1*  HCT 30.5* 30.6* 32.3* 32.7* 32.9* 33.8*  MCV 90.2 90.5 90.2 90.6  --  91.8  PLT 388 414* 437* 456*  --  381*   Basic Metabolic  Panel:  Recent Labs Lab 07/10/16 0354 07/11/16 0355 07/12/16 0330 07/13/16 0620 07/14/16 0416 07/14/16 0420  NA 139 137 139 140 141  --   K 3.9 4.2 3.6 4.1 4.3  --   CL 99* 97* 97* 100* 101  --   CO2 '30 31 29 30 29  ' --   GLUCOSE 129* 119* 135* 119* 116*  --   BUN 38* 40* 43* 40* 38*  --   CREATININE 1.09 1.12 1.29* 0.99 0.88  --   CALCIUM 9.2 9.2 9.0 9.2 9.5  --   MG 2.5* 2.5* 2.5* 2.5*  --  2.4  PHOS 3.9 4.0 4.0 3.8 3.9  --    GFR: Estimated Creatinine Clearance: 78.1 mL/min (by C-G formula based on SCr of 0.88 mg/dL). Liver Function Tests:  Recent Labs Lab 07/10/16 0354 07/11/16 0355 07/12/16 0330 07/13/16 0620 07/14/16 0416  ALBUMIN 2.6* 2.9* 2.7* 2.8* 2.8*   No results for input(s): LIPASE, AMYLASE in the last 168 hours. No results for input(s): AMMONIA in the last 168 hours. Coagulation Profile: No results for input(s): INR, PROTIME in the last 168 hours. Cardiac Enzymes: No results for input(s): CKTOTAL, CKMB, CKMBINDEX, TROPONINI in the last 168 hours. BNP (last 3 results) No results for input(s): PROBNP in the last 8760 hours. HbA1C: No results for input(s): HGBA1C in the last 72 hours. CBG:  Recent Labs Lab 07/13/16 2008 07/14/16 0012 07/14/16 0449 07/14/16 0756 07/14/16 1155  GLUCAP 107* 114* 115* 112* 116*   Lipid Profile: No results for input(s): CHOL, HDL, LDLCALC, TRIG, CHOLHDL,  LDLDIRECT in the last 72 hours. Thyroid Function Tests: No results for input(s): TSH, T4TOTAL, FREET4, T3FREE, THYROIDAB in the last 72 hours. Anemia Panel: No results for input(s): VITAMINB12, FOLATE, FERRITIN, TIBC, IRON, RETICCTPCT in the last 72 hours. Urine analysis:    Component Value Date/Time   COLORURINE YELLOW 06/29/2016 1228   APPEARANCEUR HAZY (A) 06/29/2016 1228   LABSPEC 1.020 06/29/2016 1228   PHURINE 5.5 06/29/2016 1228   GLUCOSEU NEGATIVE 06/29/2016 1228   HGBUR LARGE (A) 06/29/2016 1228   BILIRUBINUR SMALL (A) 06/29/2016 1228   KETONESUR 15 (A) 06/29/2016 1228   PROTEINUR 30 (A) 06/29/2016 1228   UROBILINOGEN 1.0 09/12/2012 1548   NITRITE NEGATIVE 06/29/2016 1228   LEUKOCYTESUR SMALL (A) 06/29/2016 1228     OSEI-BONSU,Choice Kleinsasser M.D. [facility] 07/14/2016, 1:01 PM  Pager: [phone] Between 7am to 7pm - call Pager - [phone]  After 7pm go to [email or web address] - password [email or web address]  Call night coverage person covering after 7pm

## 2016-07-14 NOTE — Progress Notes (Signed)
SLP Cancellation Note  Patient Details Name: Gregory [name] MRN: [id number] DOB: April 14, 1941   Cancelled treatment:       Reason Eval/Treat Not Completed: Other (comment) (SLP scheduling conflict)   ADAMS,PAT, M.S., CCC-SLP 07/14/2016, 1:38 PM

## 2016-07-15 LAB — BASIC METABOLIC PANEL
Anion gap: 9 (ref 5–15)
BUN: 32 mg/dL — AB (ref 6–20)
CALCIUM: 9.5 mg/dL (ref 8.9–10.3)
CO2: 31 mmol/L (ref 22–32)
Chloride: 101 mmol/L (ref 101–111)
Creatinine, Ser: 0.82 mg/dL (ref 0.61–1.24)
GFR calc Af Amer: >60 mL/min (ref >60)
Glucose, Bld: 111 mg/dL — ABNORMAL HIGH (ref 65–99)
Potassium: 4.6 mmol/L (ref 3.5–5.1)
Sodium: 141 mmol/L (ref 135–145)

## 2016-07-15 LAB — RENAL FUNCTION PANEL
ALBUMIN: 2.6 g/dL — AB (ref 3.5–5.0)
Anion gap: 9 (ref 5–15)
BUN: 32 mg/dL — ABNORMAL HIGH (ref 6–20)
CALCIUM: 9.4 mg/dL (ref 8.9–10.3)
CO2: 30 mmol/L (ref 22–32)
CREATININE: 0.77 mg/dL (ref 0.61–1.24)
Chloride: 102 mmol/L (ref 101–111)
GFR calc Af Amer: >60 mL/min (ref >60)
Glucose, Bld: 112 mg/dL — ABNORMAL HIGH (ref 65–99)
PHOSPHORUS: 3.3 mg/dL (ref 2.5–4.6)
Potassium: 4.6 mmol/L (ref 3.5–5.1)
SODIUM: 141 mmol/L (ref 135–145)

## 2016-07-15 LAB — CBC WITH DIFFERENTIAL/PLATELET
BASOS ABS: 0 10*3/uL (ref 0.0–0.1)
BASOS PCT: 0 %
EOS PCT: 4 %
Eosinophils Absolute: 0.4 10*3/uL (ref 0.0–0.7)
HCT: 32.3 % — ABNORMAL LOW (ref 39.0–52.0)
Hemoglobin: 9.6 g/dL — ABNORMAL LOW (ref 13.0–17.0)
Lymphocytes Relative: 15 %
Lymphs Abs: 1.5 10*3/uL (ref 0.7–4.0)
MCH: 27.7 pg (ref 26.0–34.0)
MCHC: 29.7 g/dL — AB (ref 30.0–36.0)
MCV: 93.1 fL (ref 78.0–100.0)
MONO ABS: 0.4 10*3/uL (ref 0.1–1.0)
Monocytes Relative: 4 %
Neutro Abs: 7.3 10*3/uL (ref 1.7–7.7)
Neutrophils Relative %: 77 %
PLATELETS: 437 10*3/uL — AB (ref 150–400)
RBC: 3.47 MIL/uL — ABNORMAL LOW (ref 4.22–5.81)
RDW: 17.7 % — AB (ref 11.5–15.5)
WBC: 9.6 10*3/uL (ref 4.0–10.5)

## 2016-07-15 LAB — GLUCOSE, CAPILLARY
GLUCOSE-CAPILLARY: 109 mg/dL — AB (ref 65–99)
GLUCOSE-CAPILLARY: 119 mg/dL — AB (ref 65–99)
Glucose-Capillary: 103 mg/dL — ABNORMAL HIGH (ref 65–99)
Glucose-Capillary: 113 mg/dL — ABNORMAL HIGH (ref 65–99)
Glucose-Capillary: 91 mg/dL (ref 65–99)
Glucose-Capillary: 104 mg/dL — ABNORMAL HIGH (ref 65–99)

## 2016-07-15 LAB — MAGNESIUM: MAGNESIUM: 2.3 mg/dL (ref 1.7–2.4)

## 2016-07-15 NOTE — Progress Notes (Signed)
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 patient demonstrated increased fatigue and decreased endurance. Compensatory strategies were unsuccessful, suspect due to fatigue and decreased ability to follow commands. Recommend patient remain NPO with temporary means of nutrition but initiate the ice chip protocol and focus on trials of nectar-thick liquids via tsp with SLP along with pharyngeal strengthening exercises. Prognosis for PO intake is good, especially as cognition improves. Thorough education provided to patient and daughter at bedside.  Impact on safety and function Moderate aspiration risk   [facility] IP TREATMENT RECOMMENDATION 06/26/2016 Treatment Recommendations Therapy as outlined in treatment plan below   Prognosis 06/26/2016 Prognosis for Safe Diet Advancement Good Barriers to Reach Goals Cognitive deficits Barriers/Prognosis Comment -- [facility] IP DIET RECOMMENDATION 06/26/2016 SLP Diet Recommendations NPO;Alternative means - temporary Liquid Administration via -- Medication Administration Via alternative means Compensations -- Postural Changes --   [facility] IP OTHER RECOMMENDATIONS 06/26/2016  Recommended Consults -- Oral Care Recommendations Oral care QID Other Recommendations --   [facility] IP  FOLLOW UP RECOMMENDATIONS 06/18/2016 Follow up Recommendations Inpatient Rehab   [facility] IP FREQUENCY AND DURATION 06/26/2016 Speech Therapy Frequency (ACUTE ONLY) min 5x/week Treatment Duration 3 weeks      [facility] IP ORAL PHASE 06/26/2016 Oral Phase Impaired Oral - Pudding Teaspoon -- Oral - Pudding Cup -- Oral - Honey Teaspoon WFL Oral - Honey Cup -- Oral - Nectar Teaspoon Left anterior bolus loss;Right anterior bolus loss;Decreased bolus cohesion Oral - Nectar Cup Left anterior bolus loss;Right anterior bolus loss;Decreased bolus cohesion Oral - Nectar Straw -- Oral - Thin Teaspoon Left anterior bolus loss;Right anterior bolus loss Oral - Thin Cup -- Oral - Thin Straw -- Oral - Puree -- Oral - Mech Soft -- Oral - Regular -- Oral - Multi-Consistency -- Oral - Pill -- Oral Phase - Comment --  [facility] IP PHARYNGEAL PHASE 06/26/2016 Pharyngeal Phase Impaired Pharyngeal- Pudding Teaspoon -- Pharyngeal -- Pharyngeal- Pudding Cup -- Pharyngeal -- Pharyngeal- Honey Teaspoon Delayed swallow initiation-vallecula;Reduced epiglottic inversion;Reduced airway/laryngeal closure;Reduced tongue base retraction;Reduced anterior laryngeal mobility;Reduced laryngeal elevation;Penetration/Apiration after swallow;Pharyngeal residue - pyriform;Pharyngeal residue - valleculae;Pharyngeal residue - cp segment;Lateral channel residue Pharyngeal Material enters airway, CONTACTS cords and then ejected out Pharyngeal- Honey Cup -- Pharyngeal -- Pharyngeal- Nectar Teaspoon Delayed swallow initiation-vallecula;Reduced epiglottic inversion;Reduced anterior laryngeal mobility;Reduced laryngeal elevation;Reduced airway/laryngeal closure;Reduced tongue base retraction;Penetration/Apiration after swallow;Pharyngeal residue - cp segment;Lateral channel residue;Pharyngeal residue - pyriform;Pharyngeal residue - valleculae Pharyngeal Material enters airway, remains ABOVE  vocal cords and not ejected out Pharyngeal- Nectar Cup Delayed swallow initiation-vallecula;Penetration/Apiration after swallow;Pharyngeal residue - valleculae;Pharyngeal residue - pyriform;Lateral channel residue;Trace aspiration;Reduced anterior laryngeal mobility;Reduced laryngeal elevation;Reduced epiglottic inversion;Reduced airway/laryngeal closure Pharyngeal Material enters airway, remains ABOVE vocal cords and not ejected out Pharyngeal- Nectar Straw -- Pharyngeal -- Pharyngeal- Thin Teaspoon Delayed swallow initiation-vallecula;Reduced airway/laryngeal closure;Reduced tongue base retraction;Reduced laryngeal elevation;Reduced anterior laryngeal mobility;Reduced epiglottic inversion;Penetration/Apiration after swallow;Pharyngeal residue - valleculae;Pharyngeal residue - pyriform;Lateral channel residue;Pharyngeal residue - cp segment;Trace aspiration Pharyngeal Material enters airway, passes BELOW cords without attempt by patient to eject out (silent aspiration) Pharyngeal- Thin Cup -- Pharyngeal -- Pharyngeal- Thin Straw -- Pharyngeal -- Pharyngeal- Puree NT Pharyngeal -- Pharyngeal- Mechanical Soft -- Pharyngeal -- Pharyngeal- Regular -- Pharyngeal -- Pharyngeal- Multi-consistency -- Pharyngeal -- Pharyngeal- Pill -- Pharyngeal -- Pharyngeal Comment --  No flowsheet data found. PAYNE, [place] 06/26/2016, 3:28 PM               Lab Data:  CBC:  Recent Labs Lab 07/10/16 0354 07/11/16 0355 07/12/16 0330 07/12/16 2311 07/13/16 0620 07/15/16 0439  WBC 9.6 10.2 11.7*  --  12.4* 9.6  NEUTROABS  --   --   --   --   --  7.3  HGB 9.6* 9.9* 10.0* 10.1* 10.1* 9.6*  HCT 30.6* 32.3* 32.7* 32.9* 33.8* 32.3*  MCV 90.5 90.2 90.6  --  91.8 93.1  PLT 414* 437* 456*  --  423* 301*   Basic Metabolic Panel:  Recent Labs Lab 07/11/16 0355 07/12/16 0330 07/13/16 0620 07/14/16 0416 07/14/16 0420 07/15/16 0439  NA 137 139 140 141  --  141  141  K 4.2 3.6 4.1 4.3  --  4.6  4.6  CL 97* 97* 100* 101   --  101  102  CO2 '31 29 30 29  ' --  31  30  GLUCOSE 119* 135* 119* 116*  --  111*  112*  BUN 40* 43* 40* 38*  --  32*  32*  CREATININE 1.12 1.29* 0.99 0.88  --  0.82  0.77  CALCIUM 9.2 9.0 9.2 9.5  --  9.5  9.4  MG 2.5* 2.5* 2.5*  --  2.4 2.3  PHOS 4.0 4.0 3.8 3.9  --  3.3   GFR: Estimated Creatinine Clearance: 85.9 mL/min (by C-G formula based on SCr of 0.77 mg/dL). Liver Function Tests:  Recent Labs Lab 07/11/16 0355 07/12/16 0330 07/13/16 0620 07/14/16 0416 07/15/16 0439  ALBUMIN 2.9* 2.7* 2.8* 2.8* 2.6*   No results for input(s): LIPASE, AMYLASE in the last 168 hours. No results for input(s): AMMONIA in the last 168 hours. Coagulation Profile: No results for input(s): INR, PROTIME in the last 168 hours. Cardiac Enzymes: No results for input(s): CKTOTAL, CKMB, CKMBINDEX, TROPONINI in the last 168 hours. BNP (last 3 results) No results for input(s): PROBNP in the last 8760 hours. HbA1C: No results for input(s): HGBA1C in the last 72 hours. CBG:  Recent Labs Lab 07/14/16 1653 07/14/16 2004 07/15/16 0105 07/15/16 0421 07/15/16 0741  GLUCAP 100* 108* 103* 109* 119*   Lipid Profile: No results for input(s): CHOL, HDL, LDLCALC, TRIG, CHOLHDL, LDLDIRECT in the last 72 hours. Thyroid Function Tests: No results for input(s): TSH, T4TOTAL, FREET4, T3FREE, THYROIDAB in the last 72 hours. Anemia Panel: No results for input(s): VITAMINB12, FOLATE, FERRITIN, TIBC, IRON, RETICCTPCT in the last 72 hours. Urine analysis:    Component Value Date/Time   COLORURINE YELLOW 06/29/2016 1228   APPEARANCEUR HAZY (A) 06/29/2016 1228   LABSPEC 1.020 06/29/2016 1228   PHURINE 5.5 06/29/2016 1228   GLUCOSEU NEGATIVE 06/29/2016 1228   HGBUR LARGE (A) 06/29/2016 1228   BILIRUBINUR SMALL (A) 06/29/2016 1228   KETONESUR 15 (A) 06/29/2016 1228   PROTEINUR 30 (A) 06/29/2016 1228   UROBILINOGEN 1.0 09/12/2012 1548   NITRITE NEGATIVE 06/29/2016 1228   LEUKOCYTESUR SMALL (A)  06/29/2016 1228     OSEI-BONSU,Beverly Suriano M.D. [facility] 07/15/2016, 1:02 PM  Pager: [phone] Between 7am to 7pm - call Pager - [phone]  After 7pm go to [email or web address] - password [email or web address]  Call night coverage person covering after 7pm

## 2016-07-15 NOTE — Progress Notes (Signed)
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 trace aspiration of residue down posterior tracheal wall with thin liquids, especially as study continued and patient demonstrated increased fatigue and decreased endurance. Compensatory strategies were unsuccessful, suspect due to fatigue and decreased ability to follow commands. Recommend patient remain NPO with temporary means of nutrition but initiate the ice chip protocol and focus on trials of nectar-thick liquids via tsp with SLP along with pharyngeal strengthening exercises. Prognosis for PO intake is good, especially as cognition improves. Thorough education provided to patient and daughter at bedside.  Impact on safety and function Moderate aspiration risk   CHL IP TREATMENT RECOMMENDATION 06/26/2016 Treatment Recommendations Therapy as outlined in treatment plan below   Prognosis 06/26/2016 Prognosis for Safe Diet Advancement Good Barriers to Reach Goals Cognitive deficits Barriers/Prognosis Comment -- CHL IP DIET RECOMMENDATION 06/26/2016 SLP Diet Recommendations NPO;Alternative means - temporary  Liquid Administration via -- Medication Administration Via alternative means Compensations -- Postural Changes --  CHL IP OTHER RECOMMENDATIONS 06/26/2016 Recommended Consults -- Oral Care Recommendations Oral care QID Other Recommendations --   CHL IP FOLLOW UP RECOMMENDATIONS 06/18/2016 Follow up Recommendations Inpatient Rehab   CHL IP FREQUENCY AND DURATION 06/26/2016 Speech Therapy Frequency (ACUTE ONLY) min 5x/week Treatment Duration 3 weeks      CHL IP ORAL PHASE 06/26/2016 Oral Phase Impaired Oral - Pudding Teaspoon -- Oral - Pudding Cup -- Oral - Honey Teaspoon WFL Oral - Honey Cup -- Oral - Nectar Teaspoon Left anterior bolus loss;Right anterior bolus loss;Decreased bolus cohesion Oral - Nectar Cup Left anterior bolus loss;Right anterior bolus loss;Decreased bolus cohesion Oral - Nectar Straw -- Oral - Thin Teaspoon Left anterior bolus loss;Right anterior bolus loss Oral - Thin Cup -- Oral - Thin Straw -- Oral - Puree -- Oral - Mech Soft -- Oral - Regular -- Oral - Multi-Consistency -- Oral - Pill -- Oral Phase - Comment --  CHL IP PHARYNGEAL PHASE 06/26/2016 Pharyngeal Phase Impaired Pharyngeal- Pudding Teaspoon -- Pharyngeal -- Pharyngeal- Pudding Cup -- Pharyngeal -- Pharyngeal- Honey Teaspoon Delayed swallow initiation-vallecula;Reduced epiglottic inversion;Reduced airway/laryngeal closure;Reduced tongue base retraction;Reduced anterior laryngeal mobility;Reduced laryngeal elevation;Penetration/Apiration after swallow;Pharyngeal residue - pyriform;Pharyngeal residue - valleculae;Pharyngeal residue - cp segment;Lateral channel residue Pharyngeal Material enters airway, CONTACTS cords and then ejected out Pharyngeal- Honey Cup -- Pharyngeal -- Pharyngeal- Nectar Teaspoon Delayed swallow initiation-vallecula;Reduced epiglottic inversion;Reduced anterior laryngeal mobility;Reduced laryngeal elevation;Reduced airway/laryngeal closure;Reduced tongue base retraction;Penetration/Apiration after swallow;Pharyngeal  residue - cp segment;Lateral channel residue;Pharyngeal residue - pyriform;Pharyngeal residue - valleculae Pharyngeal Material enters airway, remains ABOVE vocal cords and not ejected out Pharyngeal- Nectar Cup Delayed swallow initiation-vallecula;Penetration/Apiration after swallow;Pharyngeal residue - valleculae;Pharyngeal residue - pyriform;Lateral channel residue;Trace aspiration;Reduced anterior laryngeal mobility;Reduced laryngeal elevation;Reduced epiglottic inversion;Reduced airway/laryngeal closure Pharyngeal Material enters airway, remains ABOVE vocal cords and not ejected out Pharyngeal- Nectar Straw -- Pharyngeal -- Pharyngeal- Thin Teaspoon Delayed swallow initiation-vallecula;Reduced airway/laryngeal closure;Reduced tongue base retraction;Reduced laryngeal elevation;Reduced anterior laryngeal mobility;Reduced epiglottic inversion;Penetration/Apiration after swallow;Pharyngeal residue - valleculae;Pharyngeal residue - pyriform;Lateral channel residue;Pharyngeal residue - cp segment;Trace aspiration Pharyngeal Material enters airway, passes BELOW cords without attempt by patient to eject out (silent aspiration) Pharyngeal- Thin Cup -- Pharyngeal -- Pharyngeal- Thin Straw -- Pharyngeal -- Pharyngeal- Puree NT Pharyngeal -- Pharyngeal- Mechanical Soft -- Pharyngeal -- Pharyngeal- Regular -- Pharyngeal -- Pharyngeal- Multi-consistency -- Pharyngeal -- Pharyngeal- Pill -- Pharyngeal -- Pharyngeal Comment --  No flowsheet data found. PAYNE, [place] 06/26/2016, 3:28 PM               Lab Data:  CBC:  Recent Labs Lab 07/10/16 0354 07/11/16 0355 07/12/16 0330 07/12/16 2311 07/13/16 0620 07/15/16 0439  WBC 9.6 10.2 11.7*  --  12.4* 9.6  NEUTROABS  --   --   --   --   --  7.3  HGB 9.6* 9.9* 10.0* 10.1* 10.1* 9.6*  HCT 30.6* 32.3* 32.7* 32.9* 33.8* 32.3*  MCV 90.5 90.2 90.6  --  91.8 93.1  PLT 414* 437* 456*  --  423* 258*   Basic Metabolic Panel:  Recent Labs Lab 07/11/16 0355  07/12/16 0330 07/13/16 0620 07/14/16 0416 07/14/16 0420 07/15/16 0439  NA 137 139 140 141  --  141  141  K 4.2 3.6 4.1 4.3  --  4.6  4.6  CL 97* 97* 100* 101  --  101  102  CO2 '31 29 30 29  ' --  31  30  GLUCOSE 119* 135* 119* 116*  --  111*  112*  BUN  40* 43* 40* 38*  --  32*  32*  CREATININE 1.12 1.29* 0.99 0.88  --  0.82  0.77  CALCIUM 9.2 9.0 9.2 9.5  --  9.5  9.4  MG 2.5* 2.5* 2.5*  --  2.4 2.3  PHOS 4.0 4.0 3.8 3.9  --  3.3   GFR: Estimated Creatinine Clearance: 85.9 mL/min (by C-G formula based on SCr of 0.77 mg/dL). Liver Function Tests:  Recent Labs Lab 07/11/16 0355 07/12/16 0330 07/13/16 0620 07/14/16 0416 07/15/16 0439  ALBUMIN 2.9* 2.7* 2.8* 2.8* 2.6*   No results for input(s): LIPASE, AMYLASE in the last 168 hours. No results for input(s): AMMONIA in the last 168 hours. Coagulation Profile: No results for input(s): INR, PROTIME in the last 168 hours. Cardiac Enzymes: No results for input(s): CKTOTAL, CKMB, CKMBINDEX, TROPONINI in the last 168 hours. BNP (last 3 results) No results for input(s): PROBNP in the last 8760 hours. HbA1C: No results for input(s): HGBA1C in the last 72 hours. CBG:  Recent Labs Lab 07/14/16 1653 07/14/16 2004 07/15/16 0105 07/15/16 0421 07/15/16 0741  GLUCAP 100* 108* 103* 109* 119*   Lipid Profile: No results for input(s): CHOL, HDL, LDLCALC, TRIG, CHOLHDL, LDLDIRECT in the last 72 hours. Thyroid Function Tests: No results for input(s): TSH, T4TOTAL, FREET4, T3FREE, THYROIDAB in the last 72 hours. Anemia Panel: No results for input(s): VITAMINB12, FOLATE, FERRITIN, TIBC, IRON, RETICCTPCT in the last 72 hours. Urine analysis:    Component Value Date/Time   COLORURINE YELLOW 06/29/2016 1228   APPEARANCEUR HAZY (A) 06/29/2016 1228   LABSPEC 1.020 06/29/2016 1228   PHURINE 5.5 06/29/2016 1228   GLUCOSEU NEGATIVE 06/29/2016 1228   HGBUR LARGE (A) 06/29/2016 1228   BILIRUBINUR SMALL (A) 06/29/2016 1228    KETONESUR 15 (A) 06/29/2016 1228   PROTEINUR 30 (A) 06/29/2016 1228   UROBILINOGEN 1.0 09/12/2012 1548   NITRITE NEGATIVE 06/29/2016 1228   LEUKOCYTESUR SMALL (A) 06/29/2016 1228     OSEI-BONSU,Priscella Donna M.D. [facility] 07/15/2016, 8:39 AM  Pager: [phone] Between 7am to 7pm - call Pager - [phone]  After 7pm go to [email or web address] - password [email or web address]  Call night coverage person covering after 7pm

## 2016-07-16 ENCOUNTER — Encounter (HOSPITAL_COMMUNITY): Payer: Self-pay | Admitting: Otolaryngology

## 2016-07-16 DIAGNOSIS — Z930 Tracheostomy status: Secondary | ICD-10-CM

## 2016-07-16 DIAGNOSIS — J9610 Chronic respiratory failure, unspecified whether with hypoxia or hypercapnia: Secondary | ICD-10-CM

## 2016-07-16 DIAGNOSIS — J9690 Respiratory failure, unspecified, unspecified whether with hypoxia or hypercapnia: Secondary | ICD-10-CM

## 2016-07-16 DIAGNOSIS — R1310 Dysphagia, unspecified: Secondary | ICD-10-CM

## 2016-07-16 LAB — GLUCOSE, CAPILLARY
GLUCOSE-CAPILLARY: 109 mg/dL — AB (ref 65–99)
GLUCOSE-CAPILLARY: 112 mg/dL — AB (ref 65–99)
GLUCOSE-CAPILLARY: 118 mg/dL — AB (ref 65–99)
GLUCOSE-CAPILLARY: 97 mg/dL (ref 65–99)
Glucose-Capillary: 113 mg/dL — ABNORMAL HIGH (ref 65–99)
Glucose-Capillary: 96 mg/dL (ref 65–99)

## 2016-07-16 LAB — RENAL FUNCTION PANEL
ANION GAP: 9 (ref 5–15)
Albumin: 2.8 g/dL — ABNORMAL LOW (ref 3.5–5.0)
BUN: 31 mg/dL — ABNORMAL HIGH (ref 6–20)
CHLORIDE: 100 mmol/L — AB (ref 101–111)
CO2: 30 mmol/L (ref 22–32)
CREATININE: 0.72 mg/dL (ref 0.61–1.24)
Calcium: 9.7 mg/dL (ref 8.9–10.3)
Glucose, Bld: 106 mg/dL — ABNORMAL HIGH (ref 65–99)
POTASSIUM: 4.4 mmol/L (ref 3.5–5.1)
Phosphorus: 3.3 mg/dL (ref 2.5–4.6)
Sodium: 139 mmol/L (ref 135–145)

## 2016-07-16 LAB — MAGNESIUM: MAGNESIUM: 2.2 mg/dL (ref 1.7–2.4)

## 2016-07-16 MED ORDER — FUROSEMIDE 40 MG PO TABS
40.0000 mg | ORAL_TABLET | Freq: Every day | ORAL | Status: DC
  Administered 2016-07-17 – 2016-07-19 (×3): 40 mg
  Filled 2016-07-16 (×3): qty 1
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
## 2016-07-16 NOTE — Progress Notes (Signed)
We are continuing to follow for increased participation in therapies and medical readiness before initiating insurance authorization for possible IP Rehab.  Please call if questions.  Weldon PickingSusan Micole Delehanty PT Inpatient Rehab Admissions Coordinator Cell [phone] Office [phone]
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
## 2016-07-16 NOTE — Progress Notes (Signed)
07/16/2016 1:43 PM  Patient son alert nurse that patient has new left ear swelling and redness. Not present during initial assessment. Assessed left ear, red and warm to touch. Informed MD Edward JollySilva. Will continue to assess and monitor the patient.   Shatona Andujar [employer] Mutual of OmahaYoung BSN, RN-BC, [employer] [employer] Group[facility] [address] Phone [phone]
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
## 2016-07-17 DIAGNOSIS — G9340 Encephalopathy, unspecified: Secondary | ICD-10-CM

## 2016-07-17 LAB — CBC
HEMATOCRIT: 34 % — AB (ref 39.0–52.0)
HEMOGLOBIN: 10.4 g/dL — AB (ref 13.0–17.0)
MCH: 28 pg (ref 26.0–34.0)
MCHC: 30.6 g/dL (ref 30.0–36.0)
MCV: 91.6 fL (ref 78.0–100.0)
Platelets: 432 10*3/uL — ABNORMAL HIGH (ref 150–400)
RBC: 3.71 MIL/uL — ABNORMAL LOW (ref 4.22–5.81)
RDW: 17.5 % — AB (ref 11.5–15.5)
WBC: 15.4 10*3/uL — ABNORMAL HIGH (ref 4.0–10.5)

## 2016-07-17 LAB — RENAL FUNCTION PANEL
ALBUMIN: 2.8 g/dL — AB (ref 3.5–5.0)
Anion gap: 6 (ref 5–15)
BUN: 36 mg/dL — AB (ref 6–20)
CALCIUM: 9.5 mg/dL (ref 8.9–10.3)
CO2: 29 mmol/L (ref 22–32)
Chloride: 102 mmol/L (ref 101–111)
Creatinine, Ser: 0.89 mg/dL (ref 0.61–1.24)
GFR calc Af Amer: >60 mL/min (ref >60)
GFR calc non Af Amer: >60 mL/min (ref >60)
GLUCOSE: 133 mg/dL — AB (ref 65–99)
PHOSPHORUS: 2.9 mg/dL (ref 2.5–4.6)
POTASSIUM: 4.4 mmol/L (ref 3.5–5.1)
SODIUM: 137 mmol/L (ref 135–145)

## 2016-07-17 LAB — CBC WITH DIFFERENTIAL/PLATELET
Basophils Absolute: 0 10*3/uL (ref 0.0–0.1)
Basophils Relative: 0 %
EOS ABS: 0.2 10*3/uL (ref 0.0–0.7)
EOS PCT: 1 %
HCT: 33.7 % — ABNORMAL LOW (ref 39.0–52.0)
Hemoglobin: 10.4 g/dL — ABNORMAL LOW (ref 13.0–17.0)
LYMPHS ABS: 0.6 10*3/uL — AB (ref 0.7–4.0)
LYMPHS PCT: 4 %
MCH: 28.1 pg (ref 26.0–34.0)
MCHC: 30.9 g/dL (ref 30.0–36.0)
MCV: 91.1 fL (ref 78.0–100.0)
MONO ABS: 0.9 10*3/uL (ref 0.1–1.0)
Monocytes Relative: 5 %
Neutro Abs: 14.1 10*3/uL — ABNORMAL HIGH (ref 1.7–7.7)
Neutrophils Relative %: 90 %
PLATELETS: 434 10*3/uL — AB (ref 150–400)
RBC: 3.7 MIL/uL — AB (ref 4.22–5.81)
RDW: 17.6 % — AB (ref 11.5–15.5)
WBC: 15.7 10*3/uL — AB (ref 4.0–10.5)

## 2016-07-17 LAB — DIFFERENTIAL
Basophils Absolute: 0 10*3/uL (ref 0.0–0.1)
Basophils Relative: 0 %
EOS ABS: 0.4 10*3/uL (ref 0.0–0.7)
EOS PCT: 2 %
LYMPHS ABS: 1 10*3/uL (ref 0.7–4.0)
LYMPHS PCT: 6 %
MONOS PCT: 4 %
Monocytes Absolute: 0.7 10*3/uL (ref 0.1–1.0)
NEUTROS PCT: 88 %
Neutro Abs: 14 10*3/uL — ABNORMAL HIGH (ref 1.7–7.7)

## 2016-07-17 LAB — GLUCOSE, CAPILLARY
GLUCOSE-CAPILLARY: 113 mg/dL — AB (ref 65–99)
Glucose-Capillary: 103 mg/dL — ABNORMAL HIGH (ref 65–99)
Glucose-Capillary: 115 mg/dL — ABNORMAL HIGH (ref 65–99)
Glucose-Capillary: 116 mg/dL — ABNORMAL HIGH (ref 65–99)
Glucose-Capillary: 121 mg/dL — ABNORMAL HIGH (ref 65–99)
Glucose-Capillary: 138 mg/dL — ABNORMAL HIGH (ref 65–99)

## 2016-07-17 LAB — MAGNESIUM: Magnesium: 2 mg/dL (ref 1.7–2.4)

## 2016-07-17 MED ORDER — SODIUM CHLORIDE 3 % IN NEBU
4.0000 mL | INHALATION_SOLUTION | Freq: Every day | RESPIRATORY_TRACT | Status: DC
  Administered 2016-07-18 – 2016-07-19 (×2): 4 mL via RESPIRATORY_TRACT
  Filled 2016-07-17 (×3): qty 4

## 2016-07-17 MED ORDER — SCOPOLAMINE 1 MG/3DAYS TD PT72
1.0000 | MEDICATED_PATCH | TRANSDERMAL | Status: DC
  Administered 2016-07-17: 1.5 mg via TRANSDERMAL
  Filled 2016-07-17: qty 1
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
## 2016-07-17 NOTE — Progress Notes (Signed)
I met with pt and his grand daughter at bedside. I discussed with granddaughter a possible inpt rehab readmission once he is able to tolerate more therapies and secretions decreased. I discussed with PT, Yvone Neu, and would ask if pt could have a smaller back brace, since Dr. Annette Stable last saw him on 06/02/16? May assist with up activities more.I contacted pt's daughter, Karsten Fells, and discussed his rehab potential venues. I await further progress with therapies before attempting insurance approval for a possible inpt rehab readmission. [phone]
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
## 2016-07-18 DIAGNOSIS — R0902 Hypoxemia: Secondary | ICD-10-CM

## 2016-07-18 LAB — GLUCOSE, CAPILLARY
GLUCOSE-CAPILLARY: 132 mg/dL — AB (ref 65–99)
GLUCOSE-CAPILLARY: 140 mg/dL — AB (ref 65–99)
Glucose-Capillary: 112 mg/dL — ABNORMAL HIGH (ref 65–99)
Glucose-Capillary: 119 mg/dL — ABNORMAL HIGH (ref 65–99)
Glucose-Capillary: 92 mg/dL (ref 65–99)
Glucose-Capillary: 110 mg/dL — ABNORMAL HIGH (ref 65–99)

## 2016-07-18 LAB — RENAL FUNCTION PANEL
ANION GAP: 8 (ref 5–15)
Albumin: 2.6 g/dL — ABNORMAL LOW (ref 3.5–5.0)
BUN: 36 mg/dL — ABNORMAL HIGH (ref 6–20)
CALCIUM: 9.2 mg/dL (ref 8.9–10.3)
CHLORIDE: 104 mmol/L (ref 101–111)
CO2: 29 mmol/L (ref 22–32)
CREATININE: 0.86 mg/dL (ref 0.61–1.24)
Glucose, Bld: 128 mg/dL — ABNORMAL HIGH (ref 65–99)
Phosphorus: 2.6 mg/dL (ref 2.5–4.6)
Potassium: 4.4 mmol/L (ref 3.5–5.1)
Sodium: 141 mmol/L (ref 135–145)

## 2016-07-18 LAB — URINALYSIS, ROUTINE W REFLEX MICROSCOPIC
Bilirubin Urine: NEGATIVE
Glucose, UA: NEGATIVE mg/dL
KETONES UR: NEGATIVE mg/dL
Nitrite: NEGATIVE
PROTEIN: NEGATIVE mg/dL
Specific Gravity, Urine: 1.025 (ref 1.005–1.030)
pH: 5 (ref 5.0–8.0)

## 2016-07-18 LAB — MAGNESIUM: MAGNESIUM: 2.2 mg/dL (ref 1.7–2.4)

## 2016-07-18 NOTE — Progress Notes (Signed)
ATTENTION MD-  Trach in x 7 days, trach sutures due to be removed.  Please write an order if you feel it's appropriate for trach sutures to be removed as well as gauze under trach.  Thanks!
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
## 2016-07-18 NOTE — Progress Notes (Signed)
Pt did well with therapy today. I will begin insurance authorization for a possible readmission to inpt rehab when pt felt medically ready. [phone]
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
## 2016-07-18 NOTE — Care Management Important Message (Signed)
Important Message  Patient Details  Name: Gregory [name] MRN: [id number] Date of Birth: 1941/03/25   Medicare Important Message Given:  Yes    Pearlie Nies, Annamarie Majorheryl U, RN 07/18/2016, 10:48 AM

## 2016-07-19 ENCOUNTER — Inpatient Hospital Stay (HOSPITAL_COMMUNITY)
Admission: RE | Admit: 2016-07-19 | Discharge: 2016-08-17 | DRG: 091 | Disposition: A | Discharge status: Home Health | Payer: Medicare Other | Source: Intra-hospital | Attending: Physical Medicine & Rehabilitation | Admitting: Physical Medicine & Rehabilitation

## 2016-07-19 ENCOUNTER — Encounter: Payer: Medicare Other | Admitting: Vascular Surgery

## 2016-07-19 ENCOUNTER — Encounter (HOSPITAL_COMMUNITY): Payer: Medicare Other

## 2016-07-19 DIAGNOSIS — I69318 Other symptoms and signs involving cognitive functions following cerebral infarction: Secondary | ICD-10-CM

## 2016-07-19 DIAGNOSIS — Z951 Presence of aortocoronary bypass graft: Secondary | ICD-10-CM

## 2016-07-19 DIAGNOSIS — G931 Anoxic brain damage, not elsewhere classified: Secondary | ICD-10-CM | POA: Diagnosis present

## 2016-07-19 DIAGNOSIS — I69398 Other sequelae of cerebral infarction: Secondary | ICD-10-CM | POA: Diagnosis not present

## 2016-07-19 DIAGNOSIS — R338 Other retention of urine: Secondary | ICD-10-CM

## 2016-07-19 DIAGNOSIS — Z7902 Long term (current) use of antithrombotics/antiplatelets: Secondary | ICD-10-CM

## 2016-07-19 DIAGNOSIS — I480 Paroxysmal atrial fibrillation: Secondary | ICD-10-CM | POA: Diagnosis present

## 2016-07-19 DIAGNOSIS — R1310 Dysphagia, unspecified: Secondary | ICD-10-CM | POA: Diagnosis present

## 2016-07-19 DIAGNOSIS — N179 Acute kidney failure, unspecified: Secondary | ICD-10-CM | POA: Diagnosis present

## 2016-07-19 DIAGNOSIS — J189 Pneumonia, unspecified organism: Secondary | ICD-10-CM

## 2016-07-19 DIAGNOSIS — I639 Cerebral infarction, unspecified: Secondary | ICD-10-CM | POA: Diagnosis not present

## 2016-07-19 DIAGNOSIS — K219 Gastro-esophageal reflux disease without esophagitis: Secondary | ICD-10-CM | POA: Diagnosis present

## 2016-07-19 DIAGNOSIS — N401 Enlarged prostate with lower urinary tract symptoms: Secondary | ICD-10-CM | POA: Diagnosis present

## 2016-07-19 DIAGNOSIS — I723 Aneurysm of iliac artery: Secondary | ICD-10-CM | POA: Diagnosis present

## 2016-07-19 DIAGNOSIS — D62 Acute posthemorrhagic anemia: Secondary | ICD-10-CM | POA: Diagnosis present

## 2016-07-19 DIAGNOSIS — I69319 Unspecified symptoms and signs involving cognitive functions following cerebral infarction: Secondary | ICD-10-CM | POA: Diagnosis not present

## 2016-07-19 DIAGNOSIS — G9340 Encephalopathy, unspecified: Secondary | ICD-10-CM | POA: Diagnosis present

## 2016-07-19 DIAGNOSIS — Z79899 Other long term (current) drug therapy: Secondary | ICD-10-CM

## 2016-07-19 DIAGNOSIS — E860 Dehydration: Secondary | ICD-10-CM | POA: Diagnosis not present

## 2016-07-19 DIAGNOSIS — I2510 Atherosclerotic heart disease of native coronary artery without angina pectoris: Secondary | ICD-10-CM | POA: Diagnosis present

## 2016-07-19 DIAGNOSIS — Z8249 Family history of ischemic heart disease and other diseases of the circulatory system: Secondary | ICD-10-CM

## 2016-07-19 DIAGNOSIS — J398 Other specified diseases of upper respiratory tract: Secondary | ICD-10-CM

## 2016-07-19 DIAGNOSIS — Z87891 Personal history of nicotine dependence: Secondary | ICD-10-CM

## 2016-07-19 DIAGNOSIS — R319 Hematuria, unspecified: Secondary | ICD-10-CM | POA: Diagnosis present

## 2016-07-19 DIAGNOSIS — Z0189 Encounter for other specified special examinations: Secondary | ICD-10-CM

## 2016-07-19 DIAGNOSIS — Z7982 Long term (current) use of aspirin: Secondary | ICD-10-CM

## 2016-07-19 DIAGNOSIS — E785 Hyperlipidemia, unspecified: Secondary | ICD-10-CM | POA: Diagnosis present

## 2016-07-19 DIAGNOSIS — J9610 Chronic respiratory failure, unspecified whether with hypoxia or hypercapnia: Secondary | ICD-10-CM | POA: Diagnosis present

## 2016-07-19 DIAGNOSIS — R2689 Other abnormalities of gait and mobility: Principal | ICD-10-CM | POA: Diagnosis present

## 2016-07-19 DIAGNOSIS — Z930 Tracheostomy status: Secondary | ICD-10-CM

## 2016-07-19 DIAGNOSIS — I714 Abdominal aortic aneurysm, without rupture: Secondary | ICD-10-CM | POA: Diagnosis present

## 2016-07-19 DIAGNOSIS — Z823 Family history of stroke: Secondary | ICD-10-CM

## 2016-07-19 DIAGNOSIS — I6310 Cerebral infarction due to embolism of unspecified precerebral artery: Secondary | ICD-10-CM | POA: Diagnosis not present

## 2016-07-19 DIAGNOSIS — R4189 Other symptoms and signs involving cognitive functions and awareness: Secondary | ICD-10-CM | POA: Diagnosis present

## 2016-07-19 DIAGNOSIS — R310 Gross hematuria: Secondary | ICD-10-CM | POA: Diagnosis not present

## 2016-07-19 DIAGNOSIS — Z22322 Carrier or suspected carrier of Methicillin resistant Staphylococcus aureus: Secondary | ICD-10-CM

## 2016-07-19 DIAGNOSIS — I69391 Dysphagia following cerebral infarction: Secondary | ICD-10-CM | POA: Diagnosis not present

## 2016-07-19 DIAGNOSIS — K760 Fatty (change of) liver, not elsewhere classified: Secondary | ICD-10-CM | POA: Diagnosis present

## 2016-07-19 DIAGNOSIS — G20 Parkinson's disease: Secondary | ICD-10-CM | POA: Diagnosis present

## 2016-07-19 DIAGNOSIS — Z781 Physical restraint status: Secondary | ICD-10-CM

## 2016-07-19 DIAGNOSIS — I69322 Dysarthria following cerebral infarction: Secondary | ICD-10-CM

## 2016-07-19 DIAGNOSIS — R042 Hemoptysis: Secondary | ICD-10-CM

## 2016-07-19 DIAGNOSIS — I5033 Acute on chronic diastolic (congestive) heart failure: Secondary | ICD-10-CM | POA: Diagnosis present

## 2016-07-19 DIAGNOSIS — J9611 Chronic respiratory failure with hypoxia: Secondary | ICD-10-CM | POA: Diagnosis present

## 2016-07-19 DIAGNOSIS — Z7983 Long term (current) use of bisphosphonates: Secondary | ICD-10-CM

## 2016-07-19 DIAGNOSIS — I63231 Cerebral infarction due to unspecified occlusion or stenosis of right carotid arteries: Secondary | ICD-10-CM | POA: Diagnosis not present

## 2016-07-19 DIAGNOSIS — R339 Retention of urine, unspecified: Secondary | ICD-10-CM | POA: Diagnosis not present

## 2016-07-19 DIAGNOSIS — R269 Unspecified abnormalities of gait and mobility: Secondary | ICD-10-CM | POA: Diagnosis present

## 2016-07-19 DIAGNOSIS — G20A1 Parkinson's disease without dyskinesia, without mention of fluctuations: Secondary | ICD-10-CM

## 2016-07-19 DIAGNOSIS — R0902 Hypoxemia: Secondary | ICD-10-CM | POA: Diagnosis not present

## 2016-07-19 DIAGNOSIS — N281 Cyst of kidney, acquired: Secondary | ICD-10-CM | POA: Diagnosis present

## 2016-07-19 DIAGNOSIS — I6300 Cerebral infarction due to thrombosis of unspecified precerebral artery: Secondary | ICD-10-CM | POA: Diagnosis not present

## 2016-07-19 HISTORY — PX: TRACHEOSTOMY CLOSURE: SHX458

## 2016-07-19 LAB — CBC
HEMATOCRIT: 32.2 % — AB (ref 39.0–52.0)
HEMOGLOBIN: 9.6 g/dL — AB (ref 13.0–17.0)
MCH: 27.4 pg (ref 26.0–34.0)
MCHC: 29.8 g/dL — ABNORMAL LOW (ref 30.0–36.0)
MCV: 92 fL (ref 78.0–100.0)
PLATELETS: 366 10*3/uL (ref 150–400)
RBC: 3.5 MIL/uL — AB (ref 4.22–5.81)
RDW: 17.7 % — ABNORMAL HIGH (ref 11.5–15.5)
WBC: 10.9 10*3/uL — ABNORMAL HIGH (ref 4.0–10.5)

## 2016-07-19 LAB — RENAL FUNCTION PANEL
Albumin: 2.7 g/dL — ABNORMAL LOW (ref 3.5–5.0)
Anion gap: 5 (ref 5–15)
BUN: 31 mg/dL — ABNORMAL HIGH (ref 6–20)
CHLORIDE: 106 mmol/L (ref 101–111)
CO2: 30 mmol/L (ref 22–32)
CREATININE: 0.82 mg/dL (ref 0.61–1.24)
Calcium: 9 mg/dL (ref 8.9–10.3)
GFR calc non Af Amer: >60 mL/min (ref >60)
Glucose, Bld: 108 mg/dL — ABNORMAL HIGH (ref 65–99)
Phosphorus: 2.9 mg/dL (ref 2.5–4.6)
Potassium: 4 mmol/L (ref 3.5–5.1)
Sodium: 141 mmol/L (ref 135–145)

## 2016-07-19 LAB — GLUCOSE, CAPILLARY
GLUCOSE-CAPILLARY: 113 mg/dL — AB (ref 65–99)
GLUCOSE-CAPILLARY: 92 mg/dL (ref 65–99)
Glucose-Capillary: 107 mg/dL — ABNORMAL HIGH (ref 65–99)
Glucose-Capillary: 112 mg/dL — ABNORMAL HIGH (ref 65–99)
Glucose-Capillary: 91 mg/dL (ref 65–99)

## 2016-07-19 LAB — MAGNESIUM: Magnesium: 2 mg/dL (ref 1.7–2.4)

## 2016-07-19 MED ORDER — FLEET ENEMA 7-19 GM/118ML RE ENEM: 1.0000 | ENEMA | Freq: Once | RECTAL | Status: DC | PRN

## 2016-07-19 MED ORDER — DIPHENHYDRAMINE HCL 12.5 MG/5ML PO ELIX: 12.5000 mg | ORAL_SOLUTION | Freq: Four times a day (QID) | ORAL | Status: DC | PRN

## 2016-07-19 MED ORDER — FUROSEMIDE 40 MG PO TABS: 40.0000 mg | ORAL_TABLET | Freq: Every day | ORAL | 0 refills | Status: DC

## 2016-07-19 MED ORDER — CHLORHEXIDINE GLUCONATE 0.12% ORAL RINSE (MEDLINE KIT): 15.0000 mL | Freq: Two times a day (BID) | OROMUCOSAL | Status: DC

## 2016-07-19 MED ORDER — ASPIRIN 81 MG PO CHEW: 81.0000 mg | CHEWABLE_TABLET | Freq: Every day | ORAL | Status: DC

## 2016-07-19 MED ORDER — FREE WATER
100.0000 mL | Freq: Three times a day (TID) | Status: DC
  Administered 2016-07-19 – 2016-07-26 (×21): 100 mL

## 2016-07-19 MED ORDER — ALUM & MAG HYDROXIDE-SIMETH 200-200-20 MG/5ML PO SUSP
30.0000 mL | ORAL | Status: DC | PRN
  Administered 2016-08-15: 30 mL via ORAL
  Filled 2016-07-19: qty 30

## 2016-07-19 MED ORDER — SODIUM CHLORIDE 0.9% FLUSH
10.0000 mL | Freq: Two times a day (BID) | INTRAVENOUS | Status: DC
  Administered 2016-07-20 – 2016-07-29 (×4): 10 mL

## 2016-07-19 MED ORDER — CARBIDOPA-LEVODOPA 25-100 MG PO TABS
1.5000 | ORAL_TABLET | Freq: Four times a day (QID) | ORAL | Status: DC
  Administered 2016-07-20 – 2016-08-17 (×110): 1.5
  Filled 2016-07-19 (×12): qty 2
  Filled 2016-07-19: qty 1
  Filled 2016-07-19 (×46): qty 2
  Filled 2016-07-19: qty 1
  Filled 2016-07-19 (×29): qty 2
  Filled 2016-07-19: qty 1
  Filled 2016-07-19 (×10): qty 2
  Filled 2016-07-19: qty 1.5
  Filled 2016-07-19 (×4): qty 2
  Filled 2016-07-19: qty 1
  Filled 2016-07-19 (×4): qty 2

## 2016-07-19 MED ORDER — PANTOPRAZOLE SODIUM 40 MG PO PACK: 40.0000 mg | PACK | Freq: Every day | ORAL | 0 refills | Status: DC

## 2016-07-19 MED ORDER — APIXABAN 5 MG PO TABS: 5.0000 mg | ORAL_TABLET | Freq: Two times a day (BID) | ORAL | Status: DC

## 2016-07-19 MED ORDER — VITAL AF 1.2 CAL PO LIQD: 1000.0000 mL | ORAL | Status: DC

## 2016-07-19 MED ORDER — POLYETHYLENE GLYCOL 3350 17 G PO PACK: 17.0000 g | PACK | Freq: Every day | ORAL | Status: DC | PRN

## 2016-07-19 MED ORDER — SODIUM CHLORIDE 0.9% FLUSH
10.0000 mL | INTRAVENOUS | Status: DC | PRN
  Administered 2016-07-21: 10 mL
  Administered 2016-07-22: 20 mL
  Administered 2016-07-25: 10 mL
  Administered 2016-08-02: 20 mL
  Filled 2016-07-19 (×4): qty 40

## 2016-07-19 MED ORDER — SODIUM CHLORIDE 3 % IN NEBU
4.0000 mL | INHALATION_SOLUTION | Freq: Every day | RESPIRATORY_TRACT | Status: DC
  Administered 2016-07-20: 4 mL via RESPIRATORY_TRACT
  Filled 2016-07-19 (×4): qty 4

## 2016-07-19 MED ORDER — PROCHLORPERAZINE 25 MG RE SUPP: 12.5000 mg | Freq: Four times a day (QID) | RECTAL | Status: DC | PRN

## 2016-07-19 MED ORDER — ORAL CARE MOUTH RINSE
15.0000 mL | Freq: Four times a day (QID) | OROMUCOSAL | Status: DC
  Administered 2016-07-20 – 2016-08-17 (×88): 15 mL via OROMUCOSAL

## 2016-07-19 MED ORDER — CARBIDOPA-LEVODOPA 25-100 MG PO TABS: 1.5000 | ORAL_TABLET | Freq: Four times a day (QID) | ORAL | Status: DC

## 2016-07-19 MED ORDER — SENNOSIDES 8.8 MG/5ML PO SYRP
5.0000 mL | ORAL_SOLUTION | Freq: Every evening | ORAL | Status: DC | PRN
  Filled 2016-07-19: qty 5

## 2016-07-19 MED ORDER — PANTOPRAZOLE SODIUM 40 MG PO PACK
40.0000 mg | PACK | Freq: Every day | ORAL | Status: DC
  Administered 2016-07-20 – 2016-08-16 (×26): 40 mg
  Filled 2016-07-19 (×18): qty 20

## 2016-07-19 MED ORDER — ACETAMINOPHEN 160 MG/5ML PO SOLN: 650.0000 mg | Freq: Four times a day (QID) | ORAL | Status: DC | PRN

## 2016-07-19 MED ORDER — PROCHLORPERAZINE EDISYLATE 5 MG/ML IJ SOLN: 5.0000 mg | Freq: Four times a day (QID) | INTRAMUSCULAR | Status: DC | PRN

## 2016-07-19 MED ORDER — ACETAMINOPHEN 325 MG PO TABS
325.0000 mg | ORAL_TABLET | ORAL | Status: DC | PRN
  Administered 2016-08-04 – 2016-08-05 (×2): 650 mg via ORAL
  Filled 2016-07-19 (×2): qty 2

## 2016-07-19 MED ORDER — PROCHLORPERAZINE MALEATE 5 MG PO TABS: 5.0000 mg | ORAL_TABLET | Freq: Four times a day (QID) | ORAL | Status: DC | PRN

## 2016-07-19 MED ORDER — NEOMYCIN-POLYMYXIN-HC 3.5-10000-1 OT SUSP
4.0000 [drp] | Freq: Four times a day (QID) | OTIC | Status: DC
  Administered 2016-07-20 – 2016-08-17 (×86): 4 [drp] via OTIC
  Filled 2016-07-19 (×3): qty 10

## 2016-07-19 MED ORDER — SENNOSIDES 8.8 MG/5ML PO SYRP: 5.0000 mL | ORAL_SOLUTION | Freq: Every evening | ORAL | 0 refills | Status: DC | PRN

## 2016-07-19 MED ORDER — FUROSEMIDE 40 MG PO TABS
40.0000 mg | ORAL_TABLET | Freq: Every day | ORAL | Status: DC
  Administered 2016-07-20 – 2016-08-17 (×27): 40 mg
  Filled 2016-07-19 (×28): qty 1

## 2016-07-19 MED ORDER — BISACODYL 10 MG RE SUPP: 10.0000 mg | Freq: Every day | RECTAL | Status: DC | PRN

## 2016-07-19 MED ORDER — POLYETHYLENE GLYCOL 3350 17 G PO PACK: 17.0000 g | PACK | Freq: Every day | ORAL | 0 refills | Status: DC | PRN

## 2016-07-19 MED ORDER — APIXABAN 5 MG PO TABS
5.0000 mg | ORAL_TABLET | Freq: Two times a day (BID) | ORAL | Status: DC
  Administered 2016-07-19 – 2016-07-21 (×5): 5 mg
  Filled 2016-07-19 (×5): qty 1

## 2016-07-19 MED ORDER — TRAZODONE HCL 50 MG PO TABS
25.0000 mg | ORAL_TABLET | Freq: Every evening | ORAL | Status: DC | PRN
  Administered 2016-07-25 – 2016-08-13 (×19): 50 mg via ORAL
  Filled 2016-07-19 (×19): qty 1

## 2016-07-19 MED ORDER — VITAL AF 1.2 CAL PO LIQD
1000.0000 mL | ORAL | Status: DC
  Administered 2016-07-19: 1000 mL
  Filled 2016-07-19 (×4): qty 1000

## 2016-07-19 MED ORDER — CHLORHEXIDINE GLUCONATE CLOTH 2 % EX PADS
6.0000 | MEDICATED_PAD | Freq: Every day | CUTANEOUS | Status: DC
  Administered 2016-07-20 – 2016-08-03 (×10): 6 via TOPICAL

## 2016-07-19 MED ORDER — GUAIFENESIN-DM 100-10 MG/5ML PO SYRP
5.0000 mL | ORAL_SOLUTION | Freq: Four times a day (QID) | ORAL | Status: DC | PRN
  Administered 2016-07-25 – 2016-08-03 (×4): 10 mL via ORAL
  Filled 2016-07-19 (×5): qty 10
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
## 2016-07-19 NOTE — Progress Notes (Signed)
I contacted Dr. Jordan LikesPool yesterday to clarify brace usage. Noted can now mobilize without brace. I await insurance decision for inpt rehab admission. I will follow up today. [phone]

## 2016-07-19 NOTE — Discharge Summary (Signed)
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  Cardiovascular: RRR, S1/S2 +, no rubs, no gallops Respiratory: scattered rhonchi, no wheezing heard. Diminished at bases.  Abdominal: Soft, NT, ND, bowel sounds + Extremities: no edema, no cyanosis    The results of significant diagnostics from this hospitalization (including imaging, microbiology, ancillary and laboratory) are listed below for reference.     Microbiology: No results found for this or any previous visit (from the past 240 hour(s)).   Labs: BNP (last 3 results)  Recent Labs  06/16/16 0207 06/17/16 0240 06/18/16 0424  BNP 291.3* 254.1* 268.4*   Basic Metabolic Panel:  Recent Labs Lab 07/15/16 0439 07/16/16 0409 07/17/16 0443 07/18/16 0427 07/19/16 0429  NA 141  141 139 137 141 141  K 4.6  4.6 4.4 4.4 4.4 4.0  CL 101  102 100* 102 104 106  CO2 31  30 30 29 29 30   GLUCOSE 111*  112* 106* 133* 128* 108*  BUN 32*  32* 31* 36* 36* 31*  CREATININE 0.82  0.77 0.72 0.89 0.86 0.82  CALCIUM 9.5  9.4 9.7 9.5 9.2 9.0  MG 2.3 2.2 2.0 2.2 2.0  PHOS 3.3 3.3 2.9 2.6 2.9   Liver Function Tests:  Recent Labs Lab 07/15/16 0439 07/16/16 0409  07/17/16 0443 07/18/16 0427 07/19/16 0429  ALBUMIN 2.6* 2.8* 2.8* 2.6* 2.7*   No results for input(s): LIPASE, AMYLASE in the last 168 hours. No results for input(s): AMMONIA in the last 168 hours. CBC:  Recent Labs Lab 07/13/16 0620 07/15/16 0439 07/17/16 0443 07/17/16 1614 07/19/16 0429  WBC 12.4* 9.6 15.4* 15.7* 10.9*  NEUTROABS  --  7.3 14.0* 14.1*  --   HGB 10.1* 9.6* 10.4* 10.4* 9.6*  HCT 33.8* 32.3* 34.0* 33.7* 32.2*  MCV 91.8 93.1 91.6 91.1 92.0  PLT 423* 437* 432* 434* 366   Cardiac Enzymes: No results for input(s): CKTOTAL, CKMB, CKMBINDEX, TROPONINI in the last 168 hours. BNP: Invalid input(s): POCBNP CBG:  Recent Labs Lab 07/18/16 2008 07/19/16 0035 07/19/16 0401 07/19/16 0739 07/19/16 1244  GLUCAP 110* 107* 112* 113* 92   D-Dimer No results for input(s): DDIMER in the last 72 hours. Hgb A1c No results for input(s): HGBA1C in the last 72 hours. Lipid Profile No results for input(s): CHOL, HDL, LDLCALC, TRIG, CHOLHDL, LDLDIRECT in the last 72 hours. Thyroid function studies No results for input(s): TSH, T4TOTAL, T3FREE, THYROIDAB in the last 72 hours.  Invalid input(s): FREET3 Anemia work up No results for input(s): VITAMINB12, FOLATE, FERRITIN, TIBC, IRON,  RETICCTPCT in the last 72 hours. Urinalysis    Component Value Date/Time   COLORURINE YELLOW 07/18/2016 1402   APPEARANCEUR CLOUDY (A) 07/18/2016 1402   LABSPEC 1.025 07/18/2016 1402   PHURINE 5.0 07/18/2016 1402   GLUCOSEU NEGATIVE 07/18/2016 1402   HGBUR LARGE (A) 07/18/2016 1402   BILIRUBINUR NEGATIVE 07/18/2016 1402   KETONESUR NEGATIVE 07/18/2016 1402   PROTEINUR NEGATIVE 07/18/2016 1402   UROBILINOGEN 1.0 09/12/2012 1548   NITRITE NEGATIVE 07/18/2016 1402   LEUKOCYTESUR TRACE (A) 07/18/2016 1402   Sepsis Labs Invalid input(s): PROCALCITONIN,  WBC,  LACTICIDVEN Microbiology No results found for this or any previous visit (from the past 240 hour(s)).   Time coordinating  discharge: Over 30 minutes  SIGNED:   Kathlen ModyAKULA,Loran Auguste, MD  [facility] 07/19/2016, 3:09 PM Pager   If 7PM-7AM, please contact night-coverage [email or web address] Password [email or web address]
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
## 2016-07-19 NOTE — Care Management Note (Signed)
Case Management Note  Patient Details  Name: Chrisopher Pustejovsky MRN: [id number] Date of Birth: [birth year]  Subjective/Objective:            CM following for progression and d/c planning.        Action/Plan: 08/15/2016 Met with daughter, Karsten Fells re pt plan for possible d/c to LTAC for ongoing acute care. [facility] notified and reviewed record. They discussed this with Britany and at this time [facility] does not think that this pt will meet criteria for admission as he is able to work with PT today and trach changed to #6 uncuffed. Pt now with more control of secretions and tol PMV most of the day.  CIR saw pt and is pleased with his progress, they are pursuing insurance approval.   Expected Discharge Date:                  Expected Discharge Plan:  [place]  In-House Referral:     Discharge planning Services  CM Consult  Post Acute Care Choice:  NA Choice offered to:  Adult Children  DME Arranged:  N/A DME Agency:  NA  HH Arranged:  NA HH Agency:  NA  Status of Service:  Completed, signed off  If discussed at [place] of Stay Meetings, dates discussed:    Additional Comments:  Adron Bene, RN 07/19/2016, 11:10 AM
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
## 2016-07-19 NOTE — Progress Notes (Signed)
07/19/16 0006 Patient restless,trying to pull NGT and trache collar. MD on call notified. New order for bilateral restraints received. Will continue to monitor. Gregory [name], [name], RCharity fundraiser

## 2016-07-19 NOTE — Progress Notes (Signed)
I have insurance approval and bed available to admit pt to inpt rehab today.I spoke with his daughter, Brayton ElBritney and she is aware and in agreement., Dr. Blake DivineAkula is also. I will notify RN CM and SW and make arrangements to admit today. [phone]

## 2016-07-19 NOTE — Progress Notes (Signed)
Overall stable. Patient remains moderately encephalopathic but is beginning to persist date with therapy. Unable to fully assess degree of back pain but I think patient may be mobilized safely without his brace.
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
## 2016-07-19 NOTE — Progress Notes (Signed)
Patient transferred to CIR, v/s stable, no complaints.
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
## 2016-07-20 ENCOUNTER — Inpatient Hospital Stay (HOSPITAL_COMMUNITY): Payer: Medicare Other

## 2016-07-20 ENCOUNTER — Inpatient Hospital Stay (HOSPITAL_COMMUNITY): Payer: Medicare Other | Admitting: Occupational Therapy

## 2016-07-20 ENCOUNTER — Encounter (HOSPITAL_COMMUNITY): Payer: Self-pay

## 2016-07-20 ENCOUNTER — Inpatient Hospital Stay (HOSPITAL_COMMUNITY): Payer: Medicare Other | Admitting: Physical Therapy

## 2016-07-20 ENCOUNTER — Inpatient Hospital Stay (HOSPITAL_COMMUNITY): Payer: Medicare Other | Admitting: Speech Pathology

## 2016-07-20 DIAGNOSIS — J398 Other specified diseases of upper respiratory tract: Secondary | ICD-10-CM

## 2016-07-20 DIAGNOSIS — J189 Pneumonia, unspecified organism: Secondary | ICD-10-CM

## 2016-07-20 DIAGNOSIS — I63231 Cerebral infarction due to unspecified occlusion or stenosis of right carotid arteries: Secondary | ICD-10-CM

## 2016-07-20 DIAGNOSIS — Z930 Tracheostomy status: Secondary | ICD-10-CM

## 2016-07-20 DIAGNOSIS — R269 Unspecified abnormalities of gait and mobility: Secondary | ICD-10-CM

## 2016-07-20 DIAGNOSIS — I69391 Dysphagia following cerebral infarction: Secondary | ICD-10-CM

## 2016-07-20 DIAGNOSIS — R0902 Hypoxemia: Secondary | ICD-10-CM

## 2016-07-20 DIAGNOSIS — I69398 Other sequelae of cerebral infarction: Secondary | ICD-10-CM

## 2016-07-20 DIAGNOSIS — I69319 Unspecified symptoms and signs involving cognitive functions following cerebral infarction: Secondary | ICD-10-CM

## 2016-07-20 LAB — GLUCOSE, CAPILLARY
GLUCOSE-CAPILLARY: 100 mg/dL — AB (ref 65–99)
GLUCOSE-CAPILLARY: 107 mg/dL — AB (ref 65–99)
GLUCOSE-CAPILLARY: 109 mg/dL — AB (ref 65–99)
GLUCOSE-CAPILLARY: 110 mg/dL — AB (ref 65–99)
GLUCOSE-CAPILLARY: 96 mg/dL (ref 65–99)
Glucose-Capillary: 101 mg/dL — ABNORMAL HIGH (ref 65–99)
Glucose-Capillary: 106 mg/dL — ABNORMAL HIGH (ref 65–99)

## 2016-07-20 LAB — COMPREHENSIVE METABOLIC PANEL
ALK PHOS: 102 U/L (ref 38–126)
ALT: 23 U/L (ref 17–63)
AST: 16 U/L (ref 15–41)
Albumin: 2.4 g/dL — ABNORMAL LOW (ref 3.5–5.0)
Anion gap: 5 (ref 5–15)
BUN: 27 mg/dL — AB (ref 6–20)
CALCIUM: 9.1 mg/dL (ref 8.9–10.3)
CO2: 30 mmol/L (ref 22–32)
CREATININE: 0.76 mg/dL (ref 0.61–1.24)
Chloride: 104 mmol/L (ref 101–111)
Glucose, Bld: 108 mg/dL — ABNORMAL HIGH (ref 65–99)
Potassium: 3.9 mmol/L (ref 3.5–5.1)
Sodium: 139 mmol/L (ref 135–145)
Total Bilirubin: 0.4 mg/dL (ref 0.3–1.2)
Total Protein: 6.6 g/dL (ref 6.5–8.1)

## 2016-07-20 LAB — CBC WITH DIFFERENTIAL/PLATELET
Basophils Absolute: 0 10*3/uL (ref 0.0–0.1)
Basophils Relative: 0 %
Eosinophils Absolute: 0.3 10*3/uL (ref 0.0–0.7)
Eosinophils Relative: 3 %
HEMATOCRIT: 31.1 % — AB (ref 39.0–52.0)
HEMOGLOBIN: 9.3 g/dL — AB (ref 13.0–17.0)
LYMPHS ABS: 1.3 10*3/uL (ref 0.7–4.0)
LYMPHS PCT: 15 %
MCH: 27.8 pg (ref 26.0–34.0)
MCHC: 29.9 g/dL — ABNORMAL LOW (ref 30.0–36.0)
MCV: 93.1 fL (ref 78.0–100.0)
Monocytes Absolute: 0.5 10*3/uL (ref 0.1–1.0)
Monocytes Relative: 6 %
NEUTROS ABS: 6.7 10*3/uL (ref 1.7–7.7)
NEUTROS PCT: 76 %
Platelets: 340 10*3/uL (ref 150–400)
RBC: 3.34 MIL/uL — ABNORMAL LOW (ref 4.22–5.81)
RDW: 17.7 % — ABNORMAL HIGH (ref 11.5–15.5)
WBC: 8.7 10*3/uL (ref 4.0–10.5)

## 2016-07-20 LAB — PROCALCITONIN

## 2016-07-20 LAB — URINE CULTURE: Culture: NO GROWTH

## 2016-07-20 LAB — MAGNESIUM: MAGNESIUM: 1.9 mg/dL (ref 1.7–2.4)

## 2016-07-20 MED ORDER — FLUTICASONE PROPIONATE 50 MCG/ACT NA SUSP
2.0000 | Freq: Two times a day (BID) | NASAL | Status: DC
  Administered 2016-07-20 – 2016-08-17 (×54): 2 via NASAL
  Filled 2016-07-20 (×2): qty 16

## 2016-07-20 MED ORDER — CHLORHEXIDINE GLUCONATE 0.12 % MT SOLN
15.0000 mL | Freq: Two times a day (BID) | OROMUCOSAL | Status: DC
  Administered 2016-07-20 – 2016-08-17 (×56): 15 mL via OROMUCOSAL
  Filled 2016-07-20 (×56): qty 15

## 2016-07-20 MED ORDER — JEVITY 1.5 CAL/FIBER PO LIQD
1000.0000 mL | ORAL | Status: DC
  Administered 2016-07-20 – 2016-07-24 (×6): 1000 mL
  Filled 2016-07-20 (×12): qty 1000

## 2016-07-20 MED ORDER — PRO-STAT SUGAR FREE PO LIQD
30.0000 mL | Freq: Two times a day (BID) | ORAL | Status: DC
  Administered 2016-07-20 – 2016-07-23 (×7): 30 mL
  Administered 2016-07-24: 21:00:00
  Administered 2016-07-25 – 2016-08-01 (×14): 30 mL
  Filled 2016-07-20 (×21): qty 30
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
## 2016-07-20 NOTE — Progress Notes (Signed)
Patient information reviewed and entered into eRehab system by Jordy Verba, RN, CRRN, PPS Coordinator.  Information including medical coding and functional independence measure will be reviewed and updated through discharge.     Per nursing patient was given "Data Collection Information Summary for Patients in Inpatient Rehabilitation Facilities with attached "Privacy Act Statement-Health Care Records" upon admission.  
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
## 2016-07-20 NOTE — Progress Notes (Signed)
Patient transferred from [address] via hospital bed. Vital signs stable. No distress. Patient with #6 Cuffless trach. Extra thick/copious secretions tan/white color. Patient with productive cough. No signs or symptoms of pain or discomfort. Cortrak tube in left nostril.
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
## 2016-07-20 NOTE — Care Management (Signed)
Inpatient Rehabilitation Center Individual Statement of Services  Patient Name:  Gregory LoronRichard Ferg  Date:  07/20/2016  Welcome to the Inpatient Rehabilitation Center.  Our goal is to provide you with an individualized program based on your diagnosis and situation, designed to meet your specific needs.  With this comprehensive rehabilitation program, you will be expected to participate in at least 3 hours of rehabilitation therapies Monday-Friday, with modified therapy programming on the weekends.  Your rehabilitation program will include the following services:  Physical Therapy (PT), Occupational Therapy (OT), Speech Therapy (ST), 24 hour per day rehabilitation nursing, Therapeutic Recreaction (TR), Neuropsychology, Case Management (Social Worker), Rehabilitation Medicine, Nutrition Services and Pharmacy Services  Weekly team conferences will be held on Wednesday to discuss your progress.  Your Social Worker will talk with you frequently to get your input and to update you on team discussions.  Team conferences with you and your family in attendance may also be held.  Expected length of stay: 21-25 days Overall anticipated outcome: Overall min assist level  Depending on your progress and recovery, your program may change. Your Social Worker will coordinate services and will keep you informed of any changes. Your Social Worker's name and contact numbers are listed  below.  The following services may also be recommended but are not provided by the Inpatient Rehabilitation Center:   Driving Evaluations  Home Health Rehabiltiation Services  Outpatient Rehabilitation Services    Arrangements will be made to provide these services after discharge if needed.  Arrangements include referral to agencies that provide these services.  Your insurance has been verified to be:  Med-Pay & UHC-Medicare Your primary doctor is:  Darci NeedleWalter Kohut  Pertinent information will be shared with your doctor and your  insurance company.  Social Worker:  Staci AcostaJenny Prevatt, LCSW  [phone] or [phone]  Information discussed with and copy given to patient by: Lucy Chrisupree, Mette [place] G, 07/20/2016, 9:01 AM
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
## 2016-07-20 NOTE — Progress Notes (Signed)
Subjective/Complaints: Required freq suctioning last noc, RN states they opened 15 suction kits last noc Working with SLP this am, needs multiple cues to clear secretions, weak cough  ROS;  noc able to obtain due to lethargy Objective: Vital Signs: Blood pressure (!) 102/54, pulse 63, temperature 97.4 F (36.3 C), temperature source Oral, resp. rate 18, height _0  (1.753 m), weight 86.7 kg (191 lb 3.2 oz), SpO2 100 %. No results found. Results for orders placed or performed during the hospital encounter of 07/19/16 (from the past 72 hour(s))  Glucose, capillary     Status: Abnormal   Collection Time: 07/20/16 12:47 AM  Result Value Ref Range   Glucose-Capillary 110 (H) 65 - 99 mg/dL  CBC WITH DIFFERENTIAL     Status: Abnormal   Collection Time: 07/20/16  4:43 AM  Result Value Ref Range   WBC 8.7 4.0 - 10.5 K/uL   RBC 3.34 (L) 4.22 - 5.81 MIL/uL   Hemoglobin 9.3 (L) 13.0 - 17.0 g/dL   HCT 31.1 (L) 39.0 - 52.0 %   MCV 93.1 78.0 - 100.0 fL   MCH 27.8 26.0 - 34.0 pg   MCHC 29.9 (L) 30.0 - 36.0 g/dL   RDW 17.7 (H) 11.5 - 15.5 %   Platelets 340 150 - 400 K/uL   Neutrophils Relative % 76 %   Neutro Abs 6.7 1.7 - 7.7 K/uL   Lymphocytes Relative 15 %   Lymphs Abs 1.3 0.7 - 4.0 K/uL   Monocytes Relative 6 %   Monocytes Absolute 0.5 0.1 - 1.0 K/uL   Eosinophils Relative 3 %   Eosinophils Absolute 0.3 0.0 - 0.7 K/uL   Basophils Relative 0 %   Basophils Absolute 0.0 0.0 - 0.1 K/uL  Comprehensive metabolic panel     Status: Abnormal   Collection Time: 07/20/16  4:43 AM  Result Value Ref Range   Sodium 139 135 - 145 mmol/L   Potassium 3.9 3.5 - 5.1 mmol/L   Chloride 104 101 - 111 mmol/L   CO2 30 22 - 32 mmol/L   Glucose, Bld 108 (H) 65 - 99 mg/dL   BUN 27 (H) 6 - 20 mg/dL   Creatinine, Ser 0.76 0.61 - 1.24 mg/dL   Calcium 9.1 8.9 - 10.3 mg/dL   Total Protein 6.6 6.5 - 8.1 g/dL   Albumin 2.4 (L) 3.5 - 5.0 g/dL   AST 16 15 - 41 U/L   ALT 23 17 - 63 U/L   Alkaline Phosphatase  102 38 - 126 U/L   Total Bilirubin 0.4 0.3 - 1.2 mg/dL   GFR calc non Af Amer >60 >60 mL/min   GFR calc Af Amer >60 >60 mL/min    Comment: (NOTE) The eGFR has been calculated using the CKD EPI equation. This calculation has not been validated in all clinical situations. eGFR's persistently <60 mL/min signify possible Chronic Kidney Disease.    Anion gap 5 5 - 15  Magnesium     Status: None   Collection Time: 07/20/16  4:43 AM  Result Value Ref Range   Magnesium 1.9 1.7 - 2.4 mg/dL     HEENT: #6 shiley, opaque secretions Cardio: RRR and no murmur Resp: Ronchi and unlabored GI: BS positive and NT, ND Extremity:  Pulses positive and No Edema Skin:   Other trach site without bleeding Neuro: Lethargic, Abnormal Sensory cannot assess due to lethargy, Abnormal Motor 4/5 grip otherwise 3- bilateral UE and LE and Dysarthric Musc/Skel:  Other no pain with  AROM Gen NAD, weak cough   Assessment/Plan: 1. Functional deficits secondary to bilateral cortical and subcortical cardioembolic infarcts which require 3+ hours per day of interdisciplinary therapy in a comprehensive inpatient rehab setting. Physiatrist is providing close team supervision and 24 hour management of active medical problems listed below. Physiatrist and rehab team continue to assess barriers to discharge/monitor patient progress toward functional and medical goals. FIM:       Function - Toileting Toileting activity did not occur: No continent bowel/bladder event Toileting steps completed by helper: Adjust clothing prior to toileting, Performs perineal hygiene, Adjust clothing after toileting Toileting Assistive Devices: Grab bar or rail  Function - Air cabin crew transfer activity did not occur: Safety/medical concerns Toilet transfer assistive device: Mechanical lift Assist level to toilet: Maximal assist (Pt 25 - 49%/lift and lower) Assist level from toilet: Maximal assist (Pt 25 - 49%/lift and  lower) Assist level to bedside commode (at bedside): Maximal assist (Pt 25 - 49%/lift and lower) Assist level from bedside commode (at bedside): Maximal assist (Pt 25 - 49%/lift and lower)        Function - Comprehension Comprehension: Auditory Comprehension assist level: Understands basic 25 - 49% of the time/ requires cueing 50 - 75% of the time  Function - Expression Expression: Verbal Expression assist level: Expresses basic 50 - 74% of the time/requires cueing 25 - 49% of the time. Needs to repeat parts of sentences.  Function - Social Interaction Social Interaction assist level: Interacts appropriately 25 - 49% of time - Needs frequent redirection.  Function - Problem Solving Problem solving assist level: Solves basic 25 - 49% of the time - needs direction more than half the time to initiate, plan or complete simple activities  Function - Memory Memory assist level: Recognizes or recalls less than 25% of the time/requires cueing greater than 75% of the time Patient normally able to recall (first 3 days only): Current season, That he or she is in a hospital  Medical Problem List and Plan: 1. Cognitive deficits, severe dysphagia and gait disorder secondary to bilateral cardioembolic infarcts initiate CIR PT, OT, SLP 2. DVT Prophylaxis/Anticoagulation: Pharmaceutical: Other (comment)--Eliquis 3. Pain Management: tylenol prn pain. Question muscle spasms--will monitor 4. Mood: LCSW to follow for evaluation as mentation improves.  5. Neuropsych: This patient is notcapable of making decisions on hisown behalf. 6. Skin/Wound Care: routine pressure relief measures.  7. Fluids/Electrolytes/Nutrition: Monitor I/O and adjust fluid boluses as needed. Continue tube feeds. . 8. 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 Tolerated PMV on #6 trach yest Ask pulmonary to consult 9. Parkinson's disease:  Continue Sinemet 37.5/150 qid.  10. Leucocytosis: Resolving. Monitor for signs of infection.  11. ABLA: Continue to monitor--has been fluctuating from 9.6-->10.4, 9.3 on 3/2, check stool guaic 12. Hematuria: Continue to monitor for now. May need flushes resumed if worsens.  13. Severe dysphagia: NPO till mentation improves.will work with SLP for ~1 wk then decide on PEG Oral care qid. Ice chips with ST.   LOS (Days) 1 A FACE TO FACE EVALUATION WAS PERFORMED  Ilona Colley E 07/20/2016, 5:57 AM

## 2016-07-21 ENCOUNTER — Inpatient Hospital Stay (HOSPITAL_COMMUNITY): Payer: Medicare Other | Admitting: Speech Pathology

## 2016-07-21 ENCOUNTER — Inpatient Hospital Stay (HOSPITAL_COMMUNITY): Payer: Medicare Other | Admitting: Physical Therapy

## 2016-07-21 ENCOUNTER — Inpatient Hospital Stay (HOSPITAL_COMMUNITY): Payer: Medicare Other

## 2016-07-21 DIAGNOSIS — D62 Acute posthemorrhagic anemia: Secondary | ICD-10-CM

## 2016-07-21 DIAGNOSIS — J398 Other specified diseases of upper respiratory tract: Secondary | ICD-10-CM

## 2016-07-21 DIAGNOSIS — I69391 Dysphagia following cerebral infarction: Secondary | ICD-10-CM

## 2016-07-21 DIAGNOSIS — G20 Parkinson's disease: Secondary | ICD-10-CM

## 2016-07-21 DIAGNOSIS — Z930 Tracheostomy status: Secondary | ICD-10-CM | POA: Insufficient documentation

## 2016-07-21 DIAGNOSIS — I480 Paroxysmal atrial fibrillation: Secondary | ICD-10-CM

## 2016-07-21 LAB — URINE CULTURE: Culture: NO GROWTH

## 2016-07-21 LAB — GLUCOSE, CAPILLARY
GLUCOSE-CAPILLARY: 110 mg/dL — AB (ref 65–99)
GLUCOSE-CAPILLARY: 112 mg/dL — AB (ref 65–99)
GLUCOSE-CAPILLARY: 126 mg/dL — AB (ref 65–99)
Glucose-Capillary: 91 mg/dL (ref 65–99)
Glucose-Capillary: 103 mg/dL — ABNORMAL HIGH (ref 65–99)
Glucose-Capillary: 95 mg/dL (ref 65–99)

## 2016-07-21 NOTE — Progress Notes (Signed)
Subjective/Complaints: Pt seen laying in bed this AM.  He slept well overnight.  Per nursing significant secretions.   ROS: Limited due to cognition  Objective: Vital Signs: Blood pressure (!) 107/53, pulse 67, temperature 98.6 F (37 C), temperature source Oral, resp. rate 18, height '5\' 9"'  (1.753 m), weight 85.5 kg (188 lb 7.9 oz), SpO2 100 %. Dg Chest Port 1 View  Result Date: 07/20/2016 CLINICAL DATA:  Pneumonia. EXAM: PORTABLE CHEST 1 VIEW COMPARISON:  07/12/2016 FINDINGS: The cardiac silhouette, mediastinal and hilar contours are stable. The tracheostomy tube is stable. There is a feeding tube coursing down the esophagus and into the stomach. The right PICC line is stable. Low lung volumes with vascular crowding and streaky basilar atelectasis. Resolution of left lower lobe process. IMPRESSION: Stable support apparatus. Low lung volumes with vascular crowding and streaky bibasilar atelectasis but resolution of left lower lobe process seen on the prior chest film. Electronically Signed   By: Marijo Sanes M.D.   On: 07/20/2016 15:26   Dg Abd Portable 1v  Result Date: 07/21/2016 CLINICAL DATA:  Enteric tube placement EXAM: PORTABLE ABDOMEN - 1 VIEW COMPARISON:  06/29/2016 abdominal radiograph FINDINGS: Weighted enteric tube terminates in the right upper abdomen in the region of the distal stomach/pylorus. No dilated small bowel loops. Moderate to large colonic stool volume. No evidence of pneumatosis or pneumoperitoneum. No pathologic soft tissue calcifications. Healing nondisplaced fractures of lateral lower right ribs. IMPRESSION: 1. Weighted enteric tube terminates in the right upper abdomen in the region of the distal stomach/pylorus. 2. Nonobstructive bowel gas pattern . 3. Moderate to large colonic stool volume, which may indicate constipation. 4. Healing nondisplaced lower right rib fractures. Electronically Signed   By: Ilona Sorrel M.D.   On: 07/21/2016 20:15   Results for orders placed  or performed during the hospital encounter of 07/19/16 (from the past 72 hour(s))  Glucose, capillary     Status: Abnormal   Collection Time: 07/19/16  8:05 PM  Result Value Ref Range   Glucose-Capillary 100 (H) 65 - 99 mg/dL  Glucose, capillary     Status: Abnormal   Collection Time: 07/20/16 12:47 AM  Result Value Ref Range   Glucose-Capillary 110 (H) 65 - 99 mg/dL  CBC WITH DIFFERENTIAL     Status: Abnormal   Collection Time: 07/20/16  4:43 AM  Result Value Ref Range   WBC 8.7 4.0 - 10.5 K/uL   RBC 3.34 (L) 4.22 - 5.81 MIL/uL   Hemoglobin 9.3 (L) 13.0 - 17.0 g/dL   HCT 31.1 (L) 39.0 - 52.0 %   MCV 93.1 78.0 - 100.0 fL   MCH 27.8 26.0 - 34.0 pg   MCHC 29.9 (L) 30.0 - 36.0 g/dL   RDW 17.7 (H) 11.5 - 15.5 %   Platelets 340 150 - 400 K/uL   Neutrophils Relative % 76 %   Neutro Abs 6.7 1.7 - 7.7 K/uL   Lymphocytes Relative 15 %   Lymphs Abs 1.3 0.7 - 4.0 K/uL   Monocytes Relative 6 %   Monocytes Absolute 0.5 0.1 - 1.0 K/uL   Eosinophils Relative 3 %   Eosinophils Absolute 0.3 0.0 - 0.7 K/uL   Basophils Relative 0 %   Basophils Absolute 0.0 0.0 - 0.1 K/uL  Comprehensive metabolic panel     Status: Abnormal   Collection Time: 07/20/16  4:43 AM  Result Value Ref Range   Sodium 139 135 - 145 mmol/L   Potassium 3.9 3.5 - 5.1 mmol/L  Chloride 104 101 - 111 mmol/L   CO2 30 22 - 32 mmol/L   Glucose, Bld 108 (H) 65 - 99 mg/dL   BUN 27 (H) 6 - 20 mg/dL   Creatinine, Ser 0.76 0.61 - 1.24 mg/dL   Calcium 9.1 8.9 - 10.3 mg/dL   Total Protein 6.6 6.5 - 8.1 g/dL   Albumin 2.4 (L) 3.5 - 5.0 g/dL   AST 16 15 - 41 U/L   ALT 23 17 - 63 U/L   Alkaline Phosphatase 102 38 - 126 U/L   Total Bilirubin 0.4 0.3 - 1.2 mg/dL   GFR calc non Af Amer >60 >60 mL/min   GFR calc Af Amer >60 >60 mL/min    Comment: (NOTE) The eGFR has been calculated using the CKD EPI equation. This calculation has not been validated in all clinical situations. eGFR's persistently <60 mL/min signify possible Chronic  Kidney Disease.    Anion gap 5 5 - 15  Magnesium     Status: None   Collection Time: 07/20/16  4:43 AM  Result Value Ref Range   Magnesium 1.9 1.7 - 2.4 mg/dL  Glucose, capillary     Status: Abnormal   Collection Time: 07/20/16  5:18 AM  Result Value Ref Range   Glucose-Capillary 107 (H) 65 - 99 mg/dL  Culture, Urine     Status: None   Collection Time: 07/20/16  5:43 AM  Result Value Ref Range   Specimen Description URINE, CATHETERIZED    Special Requests NONE    Culture NO GROWTH    Report Status 07/21/2016 FINAL   Glucose, capillary     Status: Abnormal   Collection Time: 07/20/16  9:23 AM  Result Value Ref Range   Glucose-Capillary 109 (H) 65 - 99 mg/dL  Culture, respiratory (NON-Expectorated)     Status: None (Preliminary result)   Collection Time: 07/20/16 11:07 AM  Result Value Ref Range   Specimen Description TRACHEAL ASPIRATE    Special Requests NONE    Gram Stain      ABUNDANT WBC PRESENT,BOTH PMN AND MONONUCLEAR FEW GRAM POSITIVE COCCI IN PAIRS IN CHAINS FEW GRAM NEGATIVE COCCI IN PAIRS    Culture TOO YOUNG TO READ    Report Status PENDING   Glucose, capillary     Status: None   Collection Time: 07/20/16 11:56 AM  Result Value Ref Range   Glucose-Capillary 96 65 - 99 mg/dL  Procalcitonin - Baseline     Status: None   Collection Time: 07/20/16 12:40 PM  Result Value Ref Range   Procalcitonin <0.10 ng/mL    Comment:        Interpretation: PCT (Procalcitonin) <= 0.5 ng/mL: Systemic infection (sepsis) is not likely. Local bacterial infection is possible. (NOTE)         ICU PCT Algorithm               Non ICU PCT Algorithm    ----------------------------     ------------------------------         PCT < 0.25 ng/mL                 PCT < 0.1 ng/mL     Stopping of antibiotics            Stopping of antibiotics       strongly encouraged.               strongly encouraged.    ----------------------------     ------------------------------  PCT level decrease  by               PCT < 0.25 ng/mL       >= 80% from peak PCT       OR PCT 0.25 - 0.5 ng/mL          Stopping of antibiotics                                             encouraged.     Stopping of antibiotics           encouraged.    ----------------------------     ------------------------------       PCT level decrease by              PCT >= 0.25 ng/mL       < 80% from peak PCT        AND PCT >= 0.5 ng/mL            Continuin g antibiotics                                              encouraged.       Continuing antibiotics            encouraged.    ----------------------------     ------------------------------     PCT level increase compared          PCT > 0.5 ng/mL         with peak PCT AND          PCT >= 0.5 ng/mL             Escalation of antibiotics                                          strongly encouraged.      Escalation of antibiotics        strongly encouraged.   Glucose, capillary     Status: Abnormal   Collection Time: 07/20/16  5:00 PM  Result Value Ref Range   Glucose-Capillary 101 (H) 65 - 99 mg/dL  Glucose, capillary     Status: Abnormal   Collection Time: 07/20/16  8:40 PM  Result Value Ref Range   Glucose-Capillary 106 (H) 65 - 99 mg/dL  Glucose, capillary     Status: Abnormal   Collection Time: 07/21/16 12:18 AM  Result Value Ref Range   Glucose-Capillary 110 (H) 65 - 99 mg/dL  Glucose, capillary     Status: Abnormal   Collection Time: 07/21/16  4:56 AM  Result Value Ref Range   Glucose-Capillary 126 (H) 65 - 99 mg/dL  Glucose, capillary     Status: Abnormal   Collection Time: 07/21/16  8:30 AM  Result Value Ref Range   Glucose-Capillary 112 (H) 65 - 99 mg/dL  Glucose, capillary     Status: None   Collection Time: 07/21/16 12:00 PM  Result Value Ref Range   Glucose-Capillary 91 65 - 99 mg/dL  Glucose, capillary     Status: Abnormal   Collection Time: 07/21/16  4:46 PM  Result Value Ref Range   Glucose-Capillary 103 (  H) 65 - 99 mg/dL  Glucose,  capillary     Status: None   Collection Time: 07/21/16  8:17 PM  Result Value Ref Range   Glucose-Capillary 95 65 - 99 mg/dL     HEENT: #6 shiley, significant secretions Cardio: RRR. No JVD.  Resp: Ronchi and Unlabored GI: BS positive and ND Skin:   Other trach site without bleeding Neuro: Lethargic, Sensory cannot assess due to lethargy, Motor: ?4/5 grip otherwise 3- bilateral UE and LE and Dysarthric Musc/Skel:  No edema. No tenderness.  Gen NAD, Vital signs reviewed.    Assessment/Plan: 1. Functional deficits secondary to bilateral cortical and subcortical cardioembolic infarcts which require 3+ hours per day of interdisciplinary therapy in a comprehensive inpatient rehab setting. Physiatrist is providing close team supervision and 24 hour management of active medical problems listed below. Physiatrist and rehab team continue to assess barriers to discharge/monitor patient progress toward functional and medical goals. FIM: Function - Bathing Position: Sitting EOB Body parts bathed by patient: Right arm, Left arm, Chest, Abdomen, Right upper leg, Left upper leg Body parts bathed by helper: Left lower leg, Right lower leg, Buttocks, Front perineal area Assist Level: 2 helpers  Function- Upper Body Dressing/Undressing What is the patient wearing?: Pull over shirt/dress Pull over shirt/dress - Perfomed by patient: Thread/unthread right sleeve, Thread/unthread left sleeve Pull over shirt/dress - Perfomed by helper: Put head through opening, Pull shirt over trunk Assist Level: 2 helpers Function - Lower Body Dressing/Undressing What is the patient wearing?: Non-skid slipper socks Pants- Performed by helper: Thread/unthread right pants leg, Pull pants up/down, Thread/unthread left pants leg, Fasten/unfasten pants Non-skid slipper socks- Performed by helper: Don/doff right sock, Don/doff left sock  Function - Toileting Toileting activity did not occur: No continent bowel/bladder  event Toileting steps completed by helper: Adjust clothing prior to toileting, Performs perineal hygiene, Adjust clothing after toileting Toileting Assistive Devices: Grab bar or rail  Function - Air cabin crew transfer activity did not occur: Safety/medical concerns Toilet transfer assistive device: Elevated toilet seat/BSC over toilet Assist level to toilet: Maximal assist (Pt 25 - 49%/lift and lower) Assist level from toilet: Maximal assist (Pt 25 - 49%/lift and lower) Assist level to bedside commode (at bedside): 2 helpers Assist level from bedside commode (at bedside): 2 helpers  Function - Chair/bed transfer Chair/bed transfer method: Stand pivot Chair/bed transfer assist level: Total assist (Pt < 25%) Chair/bed transfer assistive device: Mechanical lift Mechanical lift: Stedy  Function - Locomotion: Oceanographer activity did not occur: Safety/medical concerns Wheel 50 feet with 2 turns activity did not occur: Safety/medical concerns Wheel 150 feet activity did not occur: Safety/medical concerns Function - Locomotion: Ambulation Ambulation activity did not occur: Safety/medical concerns Walk 10 feet activity did not occur: Safety/medical concerns Walk 50 feet with 2 turns activity did not occur: Safety/medical concerns Walk 150 feet activity did not occur: Safety/medical concerns Walk 10 feet on uneven surfaces activity did not occur: Safety/medical concerns  Function - Comprehension Comprehension: Auditory Comprehension assist level: Understands basic 50 - 74% of the time/ requires cueing 25 - 49% of the time  Function - Expression Expression: Verbal Expression assist level: Expresses basic 50 - 74% of the time/requires cueing 25 - 49% of the time. Needs to repeat parts of sentences.  Function - Social Interaction Social Interaction assist level: Interacts appropriately 25 - 49% of time - Needs frequent redirection.  Function - Problem Solving Problem  solving assist level: Solves basic 25 - 49% of the time -  needs direction more than half the time to initiate, plan or complete simple activities  Function - Memory Memory assist level: Recognizes or recalls less than 25% of the time/requires cueing greater than 75% of the time Patient normally able to recall (first 3 days only): That he or she is in a hospital  Medical Problem List and Plan: 1. 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 CXR 3/2 reviewed, improved 2. DVT Prophylaxis/Anticoagulation: Pharmaceutical: Other (comment)--Eliquis 3. Pain Management: tylenol prn pain. Question muscle spasms--will monitor 4. Mood: LCSW to follow for evaluation as mentation improves.  5. Neuropsych: This patient is notcapable of making decisions on hisown behalf. 6. Skin/Wound Care: routine pressure relief measures.  7. Fluids/Electrolytes/Nutrition: Monitor I/O and adjust fluid boluses as needed. Continue tube feeds. . 8. 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 Tolerated PMV on #6 trach   Per pulmonary to consult 9. Parkinson's disease: Continue Sinemet 37.5/150 qid.  10. Leucocytosis: Resolving. Monitor for signs of infection.  11. ABLA: Continue to monitor--has been fluctuating from 9.6-->10.4, 9.3 on 3/2, stool guaic pending 12. Hematuria: Continue to monitor for now. May need flushes resumed if worsens.  13. Severe dysphagia: NPO till mentation improves.will work with SLP for ~1 wk then decide on PEG Oral care qid. Ice chips with ST.   LOS (Days) 2 A FACE TO FACE EVALUATION WAS PERFORMED  Patrycja Mumpower [name] 07/21/2016, 9:20 PM

## 2016-07-21 NOTE — Progress Notes (Signed)
1815 arrived in room, tie to trach collar on patient's left was undone. Replaced tie and reported to respiratory therapist. Deep suctioned patient, mucus slightly tinged with red streak.   During afternoon, patient's family expressed concern about NG tube placement. Review of notes shows discrepancy from original placement at [id number]. Current reading on NG tube at nares is 70. Reported to MD. Orders given to get KUB and note measurement once placement is confirmed.

## 2016-07-21 NOTE — Progress Notes (Signed)
Speech Language Pathology Daily Session Note  Patient Details  Name: Gregory LoronRichard Sondgeroth MRN: [id number] Date of Birth: 05-24-1940  Today's Date: 07/21/2016 SLP Individual Time: 1108-1130 SLP Individual Time Calculation (min): 22 min  Short Term Goals: Week 1: SLP Short Term Goal 1 (Week 1): Patient will consume trials of ice chips without overt s/s of aspiration to assess readiness for repeat objective swallow assessment.  SLP Short Term Goal 2 (Week 1): Patient will increase speech intelligibility to ~100% at the word level with Min A multimodal cues for use of speech intelligibility strategies.  SLP Short Term Goal 3 (Week 1): Pt will follow basic 1 step directions with Mod A verbal cues for 75% accuracy.  SLP Short Term Goal 4 (Week 1): Patient will maintain arousal in regards to keeping his eyes open for ~60 seconds with Mod A verbal cues.  SLP Short Term Goal 5 (Week 1): Pt will tolerate PMSV for ~30 minutes without decline in vitals.  SLP Short Term Goal 6 (Week 1): Pt will complete basic familiar tasks (related to ADLs, brushing hair, oral care) with Mod A verbal cues.   Skilled Therapeutic Interventions: Skilled treatment session focused on addressing communication and swallow goals. Respiratory Therapy suctioned patient prior to session.  SLP facilitated session by donning PMSV and providing Min verbal and tactile cues to maintain arousal and initiate voicing.  Patient's speech was wet and gurgly; SLP also facilitated session by providing Max assist verbal and demonstrative cues to perform throat clears followed by swallows to reduce secretions to improve vocal quality and overall speech intelligibility.  Patient demonstrated no overt s/s of aspiration during secretion management swallow tasks.  PMSV placed for ~8 minutes prior to SpO2 readings dropping to the low 90's however once Max assist multimodal cues were provided to clear throat and swallow readings quickly returned to upper  90's-100% in 2/3 trials.  Patient dropped to the 80's once and PMSV had to be removed; suspect that fatigue and patient falling asleep could have contributed to this event.  Per family request, SLP also addressed non-verbal communication strategies; even with glasses placed visual deficits impacted written word and picture identification.  Dry erase board and marker were left with patient and family was educated on cuing for print and spacing words out with moderate accuracy at expressing basic needs and wants.  Continue with current plan of care.   Function:  Cognition Comprehension Comprehension assist level: Understands basic 50 - 74% of the time/ requires cueing 25 - 49% of the time  Expression   Expression assist level: Expresses basic 50 - 74% of the time/requires cueing 25 - 49% of the time. Needs to repeat parts of sentences.  Social Interaction Social Interaction assist level: Interacts appropriately 25 - 49% of time - Needs frequent redirection.  Problem Solving Problem solving assist level: Solves basic 25 - 49% of the time - needs direction more than half the time to initiate, plan or complete simple activities  Memory Memory assist level: Recognizes or recalls less than 25% of the time/requires cueing greater than 75% of the time    Pain Pain Assessment Pain Assessment: No/denies pain  Therapy/Group: Individual Therapy  Charlane FerrettiMelissa Jing Howatt, M.A., CCC-SLP [phone]  Gamal Todisco 07/21/2016, 12:48 PM

## 2016-07-21 NOTE — Progress Notes (Signed)
Occupational Therapy Session Note  Patient Details  Name: Gregory [name] MRN: [id number] Date of Birth: 07-02-1940  Today's Date: 07/21/2016 OT Individual Time: 1030-1100 OT Individual Time Calculation (min): 30 min   Short Term Goals: Week 1:  OT Short Term Goal 1 (Week 1): Pt will maintain sustained attention to selfcare tasks for 20 mins with no more than min instructional cueing for re-direction.  OT Short Term Goal 2 (Week 1): Pt will maintain dynamic sitting EOB or EOC during ADL with no more than mod assist.  OT Short Term Goal 3 (Week 1): Pt will complete UB bathing with supervision sitting unsupported.  OT Short Term Goal 4 (Week 1): Pt will completed LB bathing sit to stand with max assist of one.  OT Short Term Goal 5 (Week 1): Pt will donn pullup pants with max assist using AE sit to stand.   Skilled Therapeutic Interventions/Progress Updates:  ADL-retraining with focus on improved sitting balance, toilet transfer, dressing skills while seated on toilet.   Pt received seated in w/c with family present.   Pt requested assist to toilet (BM).  Using Stedy lift and +2 helper, pt stood at [place] with only mod A X 1 and maintained sitting posture during transfer to bathroom and toilet.  Pt required extra time to provide setup d/t 02 dependence and secretions requiring cleansing.    Pt did not void BM while seated but was observed with small residue from previous BM requiring total assist for hygiene.   Pt donned a tee-shirt with mod assist and pants with total assist and was returned back to w/c for SLP appt.   Family was educated on need for a communications board to assist with discerning pt's requests d/t impaired speech.     Therapy Documentation Precautions:  Precautions Precautions: Back, Fall Precaution Comments: NG Tube, bil wrist restraints Restrictions Weight Bearing Restrictions: No   Vital Signs: Therapy Vitals Pulse Rate: 72 Resp: 18 Patient Position (if  appropriate): Lying Oxygen Therapy SpO2: 95 % O2 Device: Tracheostomy Collar O2 Flow Rate (L/min): 6 L/min FiO2 (%): 28 %   Pain: Pain Assessment Pain Assessment: No/denies pain   See Function Navigator for Current Functional Status.   Therapy/Group: Individual Therapy   Second session: Time: 1400-1500 Time Calculation (min):  60 min  Pain Assessment: No evidence of pain  Skilled Therapeutic Interventions: ADL-retraining with focus on seated grooming, improved attention and activity tolerance, family ed (daughter "Margaretmary Bayley" present), sit<>stand, stand-pivot transfer.   Pt received seated in w/c with daughter present.   Pt requested return to bed but engaged in grooming after setup at sink and max vc to initiate combing his hair.   Pt required extra time to manage DME and secretions throughout session but remained engaged with mod vc to open his eyes and grasp objects presented.    OT provided fastener vest accessory and educated daughter on recommendation to present vest for distraction to distract pt from pulling off or fidgeting with medical equipment (NG tube, oximeter finger pad, catheter bag).   Pt required overall max assist for grooming and OT assisted with removing small amount of mustache and beard to improve access to pt's face when washing.   Pt was then assisted back to bed with +2 helper.   Pt completed sit<>stand with mod assist to lift but max assist to maintain postural control due to posterior lean while standing and for motor planning to step, turn and sit.   Pt required max assist for bed  mobility.   Pt left in bed, HOB elevated to 30 deg, with daughter present.   RN made aware of need to secure NG tube again to pt's nose as patient had attempted to remove tape only.   Wrist restraints applied as advised by RN at end of session.   See FIM for current functional status  Therapy/Group: Individual Therapy  Gregory [name] 07/21/2016, 12:49 PM

## 2016-07-21 NOTE — Progress Notes (Signed)
Physical Therapy Session Note  Patient Details  Name: Gregory [name] MRN: [id number] Date of Birth: [birth year]  Today's Date: 07/21/2016 PT Individual Time: 0900-1015 PT Individual Time Calculation (min): 75 min   Short Term Goals: Week 1:  PT Short Term Goal 1 (Week 1): Patient will maintain sitting balance with mod assist  PT Short Term Goal 2 (Week 1): Patient will perform sit<>stand with max assist of 1  PT Short Term Goal 3 (Week 1): Patient will initiate gait training PT Short Term Goal 4 (Week 1): patient will perform standing balance with max assist of 1 for up to 30 seconds.   Skilled Therapeutic Interventions/Progress Updates:   Pt received supine in bed and agreeable to PT. Supine>sit transfer with max  assist and max hand over hand cues for improved use of the bed features. Sitting balance EOB with min assist progressing to supervision assist after patient performed face washing task. Pt's arousal also noted to improve following faces washing. Sit<>stand with max assist with total assist to maintain standing balance to prevent posterior LOB. Stedy transfer to [facility] with mod assist . Patient able to initiate movement to grab bar without cues from PT.   Transported to rehab gym in [facility]. Sit<>stand in parallel bars with min assist from PT with pt to pull on bars to come to standing position. Standing tolerance 2 x 30 seconds. Following second bout BP assessed 104/44 in sitting position. Improved to 110/55 after rest break. Gait in parallel bars for 2 ft with mod assist from PT. SpO2 desat to 78% following gait, returned to 96% in 30 seconds.   Level Lateral scoot transfer with Mod-max assist to and from EOB. PT provided max cues for sequencing and UE placement. Patient attempted to sit<>stand several time due to poor motor planning. HS and Heel cord stretch while sitting EOB 2x 1 min each with Supervision assist for safety.   PT attempted to adjust leg rests to allow proper seating  position and decreased pressure on Ischial tuberosites. Unable to successfully adjust at this time. Will re-assess.   Patient returned too room and left sitting in [facility] with call bell in reach and all needs met.        Therapy Documentation Precautions:  Precautions Precautions: Back, Fall Precaution Comments: NG Tube, bil wrist restraints Restrictions Weight Bearing Restrictions: No Vital Signs: Therapy Vitals Pulse Rate: 61 Resp: 18 Patient Position (if appropriate): Lying Oxygen Therapy SpO2: 97 % O2 Device: Tracheostomy Collar O2 Flow Rate (L/min): 6 L/min FiO2 (%): 28 % Pain: Pain Assessment Pain Assessment: No/denies pain   See Function Navigator for Current Functional Status.   Therapy/Group: Individual Therapy  [name] 07/21/2016, 10:17 AM

## 2016-07-21 NOTE — Progress Notes (Signed)
Pt was suctioned got back moderate amount of tan thick tenacious secretions. Pt tolerated suctioning well no distress noted. RN aware

## 2016-07-22 ENCOUNTER — Inpatient Hospital Stay (HOSPITAL_COMMUNITY): Payer: Medicare Other | Admitting: *Deleted

## 2016-07-22 DIAGNOSIS — R1310 Dysphagia, unspecified: Secondary | ICD-10-CM

## 2016-07-22 DIAGNOSIS — J9610 Chronic respiratory failure, unspecified whether with hypoxia or hypercapnia: Secondary | ICD-10-CM

## 2016-07-22 DIAGNOSIS — R042 Hemoptysis: Secondary | ICD-10-CM

## 2016-07-22 LAB — PROCALCITONIN

## 2016-07-22 LAB — GLUCOSE, CAPILLARY
GLUCOSE-CAPILLARY: 103 mg/dL — AB (ref 65–99)
Glucose-Capillary: 110 mg/dL — ABNORMAL HIGH (ref 65–99)
Glucose-Capillary: 98 mg/dL (ref 65–99)
Glucose-Capillary: 124 mg/dL — ABNORMAL HIGH (ref 65–99)
Glucose-Capillary: 114 mg/dL — ABNORMAL HIGH (ref 65–99)
Glucose-Capillary: 106 mg/dL — ABNORMAL HIGH (ref 65–99)

## 2016-07-22 NOTE — Progress Notes (Signed)
X-ray confirm feed tube place in the stomach. NG tube exist from left nare noted to be at 75.

## 2016-07-22 NOTE — Progress Notes (Signed)
Physical Therapy Session Note  Patient Details  Name: Gregory [name] MRN: [id number] Date of Birth: [birth year]  Today's Date: 07/22/2016 PT Individual Time: 1415-1500 PT Individual Time Calculation (min): 45 min   Short Term Goals: Week 1:  PT Short Term Goal 1 (Week 1): Patient will maintain sitting balance with mod assist  PT Short Term Goal 2 (Week 1): Patient will perform sit<>stand with max assist of 1  PT Short Term Goal 3 (Week 1): Patient will initiate gait training PT Short Term Goal 4 (Week 1): patient will perform standing balance with max assist of 1 for up to 30 seconds.   Skilled Therapeutic Interventions/Progress Updates:  Tx focused on functional transfer training, bed mobility, and NMR for midline orientation and motor control. Pt up in reclining WC upon arrival, on 6L ATC with copious secretions, RN aware. Noted critical care note today.   [facility] +[facility] assist, but pt able to contribute ~ 30% of effort. Mod standing assist required due to L pushing in stedy. Manual facilitation for bil underhand grip and feet apart, which helped midline orientation.  NMR during sitting balance EOB for lateral leans to elbows as bil LE tasks for LAQ and marching bil x10 with multi-modal cues. Pt able to sit unsupported with close S x5 min.   Sit>supine with Mod A for bil LEs and total A for rolling L/R x10 for cleaning, changing, and positioning on side for skin protection. +[name].     Therapy Documentation Precautions:  Precautions Precautions: Back, Fall Precaution Comments: NG Tube, bil wrist restraints Restrictions Weight Bearing Restrictions: No General:   Vital Signs: Therapy Vitals Pulse Rate: 74 Resp: 18 Patient Position (if appropriate): Lying Oxygen Therapy SpO2: 98 % O2 Device: Tracheostomy Collar O2 Flow Rate (L/min): 6 L/min FiO2 (%): 28 % Pain: No indication or expressions of pain.   See Function  Navigator for Current Functional Status.   Therapy/Group: Individual Therapy  Clydene Lamingole Jennier Schissler, PT, DPT   Eulogio DitchKAMPEN, Oziah Vitanza M 07/22/2016, 3:15 PM

## 2016-07-22 NOTE — Assessment & Plan Note (Signed)
Having new mild hemoptyusis with suctioning yesterday and then again earlier today x 1. Eliquis stopped and since then has settled off. But still with lot of tracheal secretions being managed conservatively  Plan Hold eliquis Continue supportive care ATC as tolerated

## 2016-07-22 NOTE — Progress Notes (Signed)
Pt continues to have copious amounts of thick white/pale yellow secretions. No blood has been present during my assessment and suctioning. Will cont to monitor
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
## 2016-07-22 NOTE — Progress Notes (Signed)
Reported to MD about some streaks of blood in deep suction secretions. Orders given to hold eliquis. Pulmonologist saw patient today, no new orders given. Patient sat up from 11 to PT session at 1400. Copious secretions with transfer/repositioning. Family arrived and expressed concern about position of trach and secretions oozing around trach. Respiratory therapist had just seen patient with family in the room, contacted RT by phone to re-affirm placement of trach was ok and explained to family. She said she would pass by to discuss with family when able as well. Notified MD about preliminary sputum culture results. No orders given at this time. Family would like to talk to MD/PA about concerns about trach, sputum culture and chest x-ray taken on Friday.

## 2016-07-22 NOTE — Progress Notes (Addendum)
Subjective/Complaints: Pt seen laying in bed this AM.  No reported issues overnight, however, notified by nursing early this AM regarding bloody secretions.   ROS: Limited due to cognition  Objective: Vital Signs: Blood pressure 115/62, pulse 67, temperature 97.8 F (36.6 C), temperature source Oral, resp. rate 18, height '5\' 9"'  (1.753 m), weight 85.5 kg (188 lb 7.9 oz), SpO2 100 %. Dg Chest Port 1 View  Result Date: 07/20/2016 CLINICAL DATA:  Pneumonia. EXAM: PORTABLE CHEST 1 VIEW COMPARISON:  07/12/2016 FINDINGS: The cardiac silhouette, mediastinal and hilar contours are stable. The tracheostomy tube is stable. There is a feeding tube coursing down the esophagus and into the stomach. The right PICC line is stable. Low lung volumes with vascular crowding and streaky basilar atelectasis. Resolution of left lower lobe process. IMPRESSION: Stable support apparatus. Low lung volumes with vascular crowding and streaky bibasilar atelectasis but resolution of left lower lobe process seen on the prior chest film. Electronically Signed   By: Marijo Sanes M.D.   On: 07/20/2016 15:26   Dg Abd Portable 1v  Result Date: 07/21/2016 CLINICAL DATA:  Enteric tube placement EXAM: PORTABLE ABDOMEN - 1 VIEW COMPARISON:  06/29/2016 abdominal radiograph FINDINGS: Weighted enteric tube terminates in the right upper abdomen in the region of the distal stomach/pylorus. No dilated small bowel loops. Moderate to large colonic stool volume. No evidence of pneumatosis or pneumoperitoneum. No pathologic soft tissue calcifications. Healing nondisplaced fractures of lateral lower right ribs. IMPRESSION: 1. Weighted enteric tube terminates in the right upper abdomen in the region of the distal stomach/pylorus. 2. Nonobstructive bowel gas pattern . 3. Moderate to large colonic stool volume, which may indicate constipation. 4. Healing nondisplaced lower right rib fractures. Electronically Signed   By: Ilona Sorrel M.D.   On:  07/21/2016 20:15   Results for orders placed or performed during the hospital encounter of 07/19/16 (from the past 72 hour(s))  Glucose, capillary     Status: Abnormal   Collection Time: 07/19/16  8:05 PM  Result Value Ref Range   Glucose-Capillary 100 (H) 65 - 99 mg/dL  Glucose, capillary     Status: Abnormal   Collection Time: 07/20/16 12:47 AM  Result Value Ref Range   Glucose-Capillary 110 (H) 65 - 99 mg/dL  CBC WITH DIFFERENTIAL     Status: Abnormal   Collection Time: 07/20/16  4:43 AM  Result Value Ref Range   WBC 8.7 4.0 - 10.5 K/uL   RBC 3.34 (L) 4.22 - 5.81 MIL/uL   Hemoglobin 9.3 (L) 13.0 - 17.0 g/dL   HCT 31.1 (L) 39.0 - 52.0 %   MCV 93.1 78.0 - 100.0 fL   MCH 27.8 26.0 - 34.0 pg   MCHC 29.9 (L) 30.0 - 36.0 g/dL   RDW 17.7 (H) 11.5 - 15.5 %   Platelets 340 150 - 400 K/uL   Neutrophils Relative % 76 %   Neutro Abs 6.7 1.7 - 7.7 K/uL   Lymphocytes Relative 15 %   Lymphs Abs 1.3 0.7 - 4.0 K/uL   Monocytes Relative 6 %   Monocytes Absolute 0.5 0.1 - 1.0 K/uL   Eosinophils Relative 3 %   Eosinophils Absolute 0.3 0.0 - 0.7 K/uL   Basophils Relative 0 %   Basophils Absolute 0.0 0.0 - 0.1 K/uL  Comprehensive metabolic panel     Status: Abnormal   Collection Time: 07/20/16  4:43 AM  Result Value Ref Range   Sodium 139 135 - 145 mmol/L   Potassium 3.9 3.5 -  5.1 mmol/L   Chloride 104 101 - 111 mmol/L   CO2 30 22 - 32 mmol/L   Glucose, Bld 108 (H) 65 - 99 mg/dL   BUN 27 (H) 6 - 20 mg/dL   Creatinine, Ser 0.76 0.61 - 1.24 mg/dL   Calcium 9.1 8.9 - 10.3 mg/dL   Total Protein 6.6 6.5 - 8.1 g/dL   Albumin 2.4 (L) 3.5 - 5.0 g/dL   AST 16 15 - 41 U/L   ALT 23 17 - 63 U/L   Alkaline Phosphatase 102 38 - 126 U/L   Total Bilirubin 0.4 0.3 - 1.2 mg/dL   GFR calc non Af Amer >60 >60 mL/min   GFR calc Af Amer >60 >60 mL/min    Comment: (NOTE) The eGFR has been calculated using the CKD EPI equation. This calculation has not been validated in all clinical situations. eGFR's  persistently <60 mL/min signify possible Chronic Kidney Disease.    Anion gap 5 5 - 15  Magnesium     Status: None   Collection Time: 07/20/16  4:43 AM  Result Value Ref Range   Magnesium 1.9 1.7 - 2.4 mg/dL  Glucose, capillary     Status: Abnormal   Collection Time: 07/20/16  5:18 AM  Result Value Ref Range   Glucose-Capillary 107 (H) 65 - 99 mg/dL  Culture, Urine     Status: None   Collection Time: 07/20/16  5:43 AM  Result Value Ref Range   Specimen Description URINE, CATHETERIZED    Special Requests NONE    Culture NO GROWTH    Report Status 07/21/2016 FINAL   Glucose, capillary     Status: Abnormal   Collection Time: 07/20/16  9:23 AM  Result Value Ref Range   Glucose-Capillary 109 (H) 65 - 99 mg/dL  Culture, respiratory (NON-Expectorated)     Status: None (Preliminary result)   Collection Time: 07/20/16 11:07 AM  Result Value Ref Range   Specimen Description TRACHEAL ASPIRATE    Special Requests NONE    Gram Stain      ABUNDANT WBC PRESENT,BOTH PMN AND MONONUCLEAR FEW GRAM POSITIVE COCCI IN PAIRS IN CHAINS FEW GRAM NEGATIVE COCCI IN PAIRS    Culture TOO YOUNG TO READ    Report Status PENDING   Glucose, capillary     Status: None   Collection Time: 07/20/16 11:56 AM  Result Value Ref Range   Glucose-Capillary 96 65 - 99 mg/dL  Procalcitonin - Baseline     Status: None   Collection Time: 07/20/16 12:40 PM  Result Value Ref Range   Procalcitonin <0.10 ng/mL    Comment:        Interpretation: PCT (Procalcitonin) <= 0.5 ng/mL: Systemic infection (sepsis) is not likely. Local bacterial infection is possible. (NOTE)         ICU PCT Algorithm               Non ICU PCT Algorithm    ----------------------------     ------------------------------         PCT < 0.25 ng/mL                 PCT < 0.1 ng/mL     Stopping of antibiotics            Stopping of antibiotics       strongly encouraged.               strongly encouraged.    ----------------------------      ------------------------------  PCT level decrease by               PCT < 0.25 ng/mL       >= 80% from peak PCT       OR PCT 0.25 - 0.5 ng/mL          Stopping of antibiotics                                             encouraged.     Stopping of antibiotics           encouraged.    ----------------------------     ------------------------------       PCT level decrease by              PCT >= 0.25 ng/mL       < 80% from peak PCT        AND PCT >= 0.5 ng/mL            Continuin g antibiotics                                              encouraged.       Continuing antibiotics            encouraged.    ----------------------------     ------------------------------     PCT level increase compared          PCT > 0.5 ng/mL         with peak PCT AND          PCT >= 0.5 ng/mL             Escalation of antibiotics                                          strongly encouraged.      Escalation of antibiotics        strongly encouraged.   Glucose, capillary     Status: Abnormal   Collection Time: 07/20/16  5:00 PM  Result Value Ref Range   Glucose-Capillary 101 (H) 65 - 99 mg/dL  Glucose, capillary     Status: Abnormal   Collection Time: 07/20/16  8:40 PM  Result Value Ref Range   Glucose-Capillary 106 (H) 65 - 99 mg/dL  Glucose, capillary     Status: Abnormal   Collection Time: 07/21/16 12:18 AM  Result Value Ref Range   Glucose-Capillary 110 (H) 65 - 99 mg/dL  Glucose, capillary     Status: Abnormal   Collection Time: 07/21/16  4:56 AM  Result Value Ref Range   Glucose-Capillary 126 (H) 65 - 99 mg/dL  Glucose, capillary     Status: Abnormal   Collection Time: 07/21/16  8:30 AM  Result Value Ref Range   Glucose-Capillary 112 (H) 65 - 99 mg/dL  Glucose, capillary     Status: None   Collection Time: 07/21/16 12:00 PM  Result Value Ref Range   Glucose-Capillary 91 65 - 99 mg/dL  Glucose, capillary     Status: Abnormal   Collection Time: 07/21/16  4:46 PM  Result Value Ref  Range   Glucose-Capillary 103 (  H) 65 - 99 mg/dL  Glucose, capillary     Status: None   Collection Time: 07/21/16  8:17 PM  Result Value Ref Range   Glucose-Capillary 95 65 - 99 mg/dL  Glucose, capillary     Status: Abnormal   Collection Time: 07/22/16 12:07 AM  Result Value Ref Range   Glucose-Capillary 103 (H) 65 - 99 mg/dL  Procalcitonin     Status: None   Collection Time: 07/22/16  4:40 AM  Result Value Ref Range   Procalcitonin <0.10 ng/mL    Comment:        Interpretation: PCT (Procalcitonin) <= 0.5 ng/mL: Systemic infection (sepsis) is not likely. Local bacterial infection is possible. (NOTE)         ICU PCT Algorithm               Non ICU PCT Algorithm    ----------------------------     ------------------------------         PCT < 0.25 ng/mL                 PCT < 0.1 ng/mL     Stopping of antibiotics            Stopping of antibiotics       strongly encouraged.               strongly encouraged.    ----------------------------     ------------------------------       PCT level decrease by               PCT < 0.25 ng/mL       >= 80% from peak PCT       OR PCT 0.25 - 0.5 ng/mL          Stopping of antibiotics                                             encouraged.     Stopping of antibiotics           encouraged.    ----------------------------     ------------------------------       PCT level decrease by              PCT >= 0.25 ng/mL       < 80% from peak PCT        AND PCT >= 0.5 ng/mL            Continuin g antibiotics                                              encouraged.       Continuing antibiotics            encouraged.    ----------------------------     ------------------------------     PCT level increase compared          PCT > 0.5 ng/mL         with peak PCT AND          PCT >= 0.5 ng/mL             Escalation of antibiotics  strongly encouraged.      Escalation of antibiotics        strongly encouraged.    Glucose, capillary     Status: Abnormal   Collection Time: 07/22/16  4:51 AM  Result Value Ref Range   Glucose-Capillary 110 (H) 65 - 99 mg/dL  Glucose, capillary     Status: None   Collection Time: 07/22/16  7:52 AM  Result Value Ref Range   Glucose-Capillary 98 65 - 99 mg/dL     HEENT: Normocephalic. Healing trauma Neck: #6 shiley, significant secretions Cardio: RRR. No JVD.  Resp: Ronchi and Unlabored GI: BS positive and ND Skin:   Other trach site without bleeding Neuro: Somnolent Sensory cannot assess due to somnolence , Motor: Unable to assess this AM due to somnolence  Musc/Skel:  No edema. No tenderness.  Gen NAD, Vital signs reviewed.    Assessment/Plan: 1. Functional deficits secondary to bilateral cortical and subcortical cardioembolic infarcts which require 3+ hours per day of interdisciplinary therapy in a comprehensive inpatient rehab setting. Physiatrist is providing close team supervision and 24 hour management of active medical problems listed below. Physiatrist and rehab team continue to assess barriers to discharge/monitor patient progress toward functional and medical goals. FIM: Function - Bathing Position: Sitting EOB Body parts bathed by patient: Right arm, Left arm, Chest, Abdomen, Right upper leg, Left upper leg Body parts bathed by helper: Left lower leg, Right lower leg, Buttocks, Front perineal area Assist Level: 2 helpers  Function- Upper Body Dressing/Undressing What is the patient wearing?: Pull over shirt/dress Pull over shirt/dress - Perfomed by patient: Thread/unthread right sleeve, Thread/unthread left sleeve Pull over shirt/dress - Perfomed by helper: Put head through opening, Pull shirt over trunk Assist Level: 2 helpers Function - Lower Body Dressing/Undressing What is the patient wearing?: Non-skid slipper socks Pants- Performed by helper: Thread/unthread right pants leg, Pull pants up/down, Thread/unthread left pants leg,  Fasten/unfasten pants Non-skid slipper socks- Performed by helper: Don/doff right sock, Don/doff left sock  Function - Toileting Toileting activity did not occur: No continent bowel/bladder event Toileting steps completed by helper: Adjust clothing prior to toileting, Performs perineal hygiene, Adjust clothing after toileting Toileting Assistive Devices: Grab bar or rail  Function - Air cabin crew transfer activity did not occur: Safety/medical concerns Toilet transfer assistive device: Elevated toilet seat/BSC over toilet Assist level to toilet: Maximal assist (Pt 25 - 49%/lift and lower) Assist level from toilet: Maximal assist (Pt 25 - 49%/lift and lower) Assist level to bedside commode (at bedside): 2 helpers Assist level from bedside commode (at bedside): 2 helpers  Function - Chair/bed transfer Chair/bed transfer method: Stand pivot Chair/bed transfer assist level: Total assist (Pt < 25%) Chair/bed transfer assistive device: Mechanical lift Mechanical lift: Stedy  Function - Locomotion: Oceanographer activity did not occur: Safety/medical concerns Wheel 50 feet with 2 turns activity did not occur: Safety/medical concerns Wheel 150 feet activity did not occur: Safety/medical concerns Function - Locomotion: Ambulation Ambulation activity did not occur: Safety/medical concerns Walk 10 feet activity did not occur: Safety/medical concerns Walk 50 feet with 2 turns activity did not occur: Safety/medical concerns Walk 150 feet activity did not occur: Safety/medical concerns Walk 10 feet on uneven surfaces activity did not occur: Safety/medical concerns  Function - Comprehension Comprehension: Auditory Comprehension assist level: Understands basic 50 - 74% of the time/ requires cueing 25 - 49% of the time  Function - Expression Expression: Verbal Expression assist level: Expresses basic 50 - 74% of the time/requires cueing  25 - 49% of the time. Needs to repeat  parts of sentences.  Function - Social Interaction Social Interaction assist level: Interacts appropriately 50 - 74% of the time - May be physically or verbally inappropriate.  Function - Problem Solving Problem solving assist level: Solves basic 25 - 49% of the time - needs direction more than half the time to initiate, plan or complete simple activities  Function - Memory Memory assist level: Recognizes or recalls 25 - 49% of the time/requires cueing 50 - 75% of the time Patient normally able to recall (first 3 days only): That he or she is in a hospital  Medical Problem List and Plan: 1. 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 DVT Prophylaxis/Anticoagulation: Pharmaceutical: Other (comment)--Eliquis d/ced due to increase in bloody secretions 3. Pain Management: tylenol prn pain. Question muscle spasms--will monitor 4. Mood: LCSW to follow for evaluation as mentation improves.  5. Neuropsych: This patient is notcapable of making decisions on hisown behalf. 6. Skin/Wound Care: routine pressure relief measures.  7. Fluids/Electrolytes/Nutrition: Monitor I/O and adjust fluid boluses as needed. Continue tube feeds. . 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 Tolerated PMV on #6 trach   Now with bloody secretions, will consult Pulm due to reason for transfer on last admission 9. Parkinson's disease: Continue Sinemet 37.5/150 qid.  10. Leucocytosis: Resolving. Monitor for signs of infection.  11. ABLA: Continue to monitor--has been fluctuating from 9.6-->10.4, 9.3 on 3/2, stool guaic remains pending  Labs ordered for tomorrow 12. Hematuria: Continue to monitor for now. May need flushes resumed if worsens.  13. Severe dysphagia: NPO till mentation improves.will work with SLP for ~1 wk then decide on PEG Oral care qid. Ice chips with ST.    Concerns of dislodgement of NG, KUB ordered, reviewed, no dislodgement  LOS (Days) 3 A FACE TO FACE EVALUATION WAS PERFORMED  Michaelene Dutan [name] 07/22/2016, 7:59 AM

## 2016-07-22 NOTE — Plan of Care (Signed)
Problem: RH BLADDER ELIMINATION Goal: RH STG MANAGE BLADDER WITH EQUIPMENT WITH ASSISTANCE STG Manage Bladder With Equipment With mod Assistance  Outcome: Not Progressing Foley cath    

## 2016-07-23 ENCOUNTER — Encounter: Payer: Medicare Other | Attending: Physical Medicine & Rehabilitation

## 2016-07-23 ENCOUNTER — Inpatient Hospital Stay (HOSPITAL_COMMUNITY): Payer: Medicare Other

## 2016-07-23 ENCOUNTER — Encounter (HOSPITAL_COMMUNITY): Payer: Self-pay | Admitting: Physician Assistant

## 2016-07-23 ENCOUNTER — Inpatient Hospital Stay (HOSPITAL_COMMUNITY): Payer: Medicare Other | Admitting: Speech Pathology

## 2016-07-23 ENCOUNTER — Inpatient Hospital Stay: Payer: Medicare Other | Admitting: Physical Medicine & Rehabilitation

## 2016-07-23 ENCOUNTER — Inpatient Hospital Stay (HOSPITAL_COMMUNITY): Payer: Medicare Other | Admitting: Occupational Therapy

## 2016-07-23 LAB — BASIC METABOLIC PANEL
Anion gap: 4 — ABNORMAL LOW (ref 5–15)
BUN: 21 mg/dL — AB (ref 6–20)
CHLORIDE: 99 mmol/L — AB (ref 101–111)
CO2: 33 mmol/L — ABNORMAL HIGH (ref 22–32)
CREATININE: 0.76 mg/dL (ref 0.61–1.24)
Calcium: 8.9 mg/dL (ref 8.9–10.3)
GFR calc Af Amer: >60 mL/min (ref >60)
GFR calc non Af Amer: >60 mL/min (ref >60)
GLUCOSE: 128 mg/dL — AB (ref 65–99)
POTASSIUM: 4.2 mmol/L (ref 3.5–5.1)
Sodium: 136 mmol/L (ref 135–145)

## 2016-07-23 LAB — GLUCOSE, CAPILLARY
GLUCOSE-CAPILLARY: 101 mg/dL — AB (ref 65–99)
GLUCOSE-CAPILLARY: 113 mg/dL — AB (ref 65–99)
GLUCOSE-CAPILLARY: 117 mg/dL — AB (ref 65–99)
GLUCOSE-CAPILLARY: 119 mg/dL — AB (ref 65–99)
GLUCOSE-CAPILLARY: 119 mg/dL — AB (ref 65–99)
GLUCOSE-CAPILLARY: 91 mg/dL (ref 65–99)
Glucose-Capillary: 124 mg/dL — ABNORMAL HIGH (ref 65–99)

## 2016-07-23 LAB — CULTURE, RESPIRATORY W GRAM STAIN

## 2016-07-23 LAB — CULTURE, RESPIRATORY

## 2016-07-23 MED ORDER — GUAIFENESIN 100 MG/5ML PO SOLN
10.0000 mL | Freq: Three times a day (TID) | ORAL | Status: DC
  Administered 2016-07-23 – 2016-08-17 (×93): 200 mg via ORAL
  Filled 2016-07-23: qty 5
  Filled 2016-07-23 (×3): qty 10
  Filled 2016-07-23: qty 5
  Filled 2016-07-23 (×7): qty 10
  Filled 2016-07-23: qty 50
  Filled 2016-07-23: qty 5
  Filled 2016-07-23 (×4): qty 10
  Filled 2016-07-23: qty 5
  Filled 2016-07-23 (×11): qty 10
  Filled 2016-07-23 (×2): qty 5
  Filled 2016-07-23 (×3): qty 10
  Filled 2016-07-23 (×2): qty 5
  Filled 2016-07-23 (×5): qty 10
  Filled 2016-07-23: qty 5
  Filled 2016-07-23 (×2): qty 10
  Filled 2016-07-23: qty 5
  Filled 2016-07-23 (×10): qty 10
  Filled 2016-07-23: qty 50
  Filled 2016-07-23 (×8): qty 10
  Filled 2016-07-23: qty 5
  Filled 2016-07-23 (×3): qty 10
  Filled 2016-07-23: qty 5
  Filled 2016-07-23 (×2): qty 10
  Filled 2016-07-23: qty 5
  Filled 2016-07-23 (×3): qty 10
  Filled 2016-07-23: qty 5
  Filled 2016-07-23 (×2): qty 10
  Filled 2016-07-23 (×2): qty 5
  Filled 2016-07-23: qty 50
  Filled 2016-07-23: qty 10
  Filled 2016-07-23: qty 50
  Filled 2016-07-23 (×9): qty 10

## 2016-07-23 MED ORDER — GLYCOPYRROLATE 1 MG PO TABS
1.0000 mg | ORAL_TABLET | ORAL | Status: DC | PRN
  Administered 2016-07-23: 1 mg
  Filled 2016-07-23 (×2): qty 1

## 2016-07-23 MED ORDER — POLYETHYLENE GLYCOL 3350 17 G PO PACK
17.0000 g | PACK | Freq: Every day | ORAL | Status: DC | PRN
  Administered 2016-07-23 – 2016-08-09 (×2): 17 g
  Filled 2016-07-23 (×3): qty 1

## 2016-07-23 NOTE — Progress Notes (Signed)
Subjective/Complaints:  still with copious secretions,   ROS: Limited due to cognition  Objective: Vital Signs: Blood pressure 126/64, pulse 75, temperature 98.4 F (36.9 C), temperature source Oral, resp. rate 18, height '5\' 9"'  (1.753 m), weight 85.1 kg (187 lb 9.8 oz), SpO2 97 %. Dg Abd Portable 1v  Result Date: 07/21/2016 CLINICAL DATA:  Enteric tube placement EXAM: PORTABLE ABDOMEN - 1 VIEW COMPARISON:  06/29/2016 abdominal radiograph FINDINGS: Weighted enteric tube terminates in the right upper abdomen in the region of the distal stomach/pylorus. No dilated small bowel loops. Moderate to large colonic stool volume. No evidence of pneumatosis or pneumoperitoneum. No pathologic soft tissue calcifications. Healing nondisplaced fractures of lateral lower right ribs. IMPRESSION: 1. Weighted enteric tube terminates in the right upper abdomen in the region of the distal stomach/pylorus. 2. Nonobstructive bowel gas pattern . 3. Moderate to large colonic stool volume, which may indicate constipation. 4. Healing nondisplaced lower right rib fractures. Electronically Signed   By: Ilona Sorrel M.D.   On: 07/21/2016 20:15   Results for orders placed or performed during the hospital encounter of 07/19/16 (from the past 72 hour(s))  Glucose, capillary     Status: Abnormal   Collection Time: 07/20/16  9:23 AM  Result Value Ref Range   Glucose-Capillary 109 (H) 65 - 99 mg/dL  Culture, respiratory (NON-Expectorated)     Status: None (Preliminary result)   Collection Time: 07/20/16 11:07 AM  Result Value Ref Range   Specimen Description TRACHEAL ASPIRATE    Special Requests NONE    Gram Stain      ABUNDANT WBC PRESENT,BOTH PMN AND MONONUCLEAR FEW GRAM POSITIVE COCCI IN PAIRS IN CHAINS FEW GRAM NEGATIVE COCCI IN PAIRS    Culture ABUNDANT STAPHYLOCOCCUS AUREUS    Report Status PENDING   Glucose, capillary     Status: None   Collection Time: 07/20/16 11:56 AM  Result Value Ref Range    Glucose-Capillary 96 65 - 99 mg/dL  Procalcitonin - Baseline     Status: None   Collection Time: 07/20/16 12:40 PM  Result Value Ref Range   Procalcitonin <0.10 ng/mL    Comment:        Interpretation: PCT (Procalcitonin) <= 0.5 ng/mL: Systemic infection (sepsis) is not likely. Local bacterial infection is possible. (NOTE)         ICU PCT Algorithm               Non ICU PCT Algorithm    ----------------------------     ------------------------------         PCT < 0.25 ng/mL                 PCT < 0.1 ng/mL     Stopping of antibiotics            Stopping of antibiotics       strongly encouraged.               strongly encouraged.    ----------------------------     ------------------------------       PCT level decrease by               PCT < 0.25 ng/mL       >= 80% from peak PCT       OR PCT 0.25 - 0.5 ng/mL          Stopping of antibiotics  encouraged.     Stopping of antibiotics           encouraged.    ----------------------------     ------------------------------       PCT level decrease by              PCT >= 0.25 ng/mL       < 80% from peak PCT        AND PCT >= 0.5 ng/mL            Continuin g antibiotics                                              encouraged.       Continuing antibiotics            encouraged.    ----------------------------     ------------------------------     PCT level increase compared          PCT > 0.5 ng/mL         with peak PCT AND          PCT >= 0.5 ng/mL             Escalation of antibiotics                                          strongly encouraged.      Escalation of antibiotics        strongly encouraged.   Glucose, capillary     Status: Abnormal   Collection Time: 07/20/16  5:00 PM  Result Value Ref Range   Glucose-Capillary 101 (H) 65 - 99 mg/dL  Glucose, capillary     Status: Abnormal   Collection Time: 07/20/16  8:40 PM  Result Value Ref Range   Glucose-Capillary 106 (H) 65 - 99  mg/dL  Glucose, capillary     Status: Abnormal   Collection Time: 07/21/16 12:18 AM  Result Value Ref Range   Glucose-Capillary 110 (H) 65 - 99 mg/dL  Glucose, capillary     Status: Abnormal   Collection Time: 07/21/16  4:56 AM  Result Value Ref Range   Glucose-Capillary 126 (H) 65 - 99 mg/dL  Glucose, capillary     Status: Abnormal   Collection Time: 07/21/16  8:30 AM  Result Value Ref Range   Glucose-Capillary 112 (H) 65 - 99 mg/dL  Glucose, capillary     Status: None   Collection Time: 07/21/16 12:00 PM  Result Value Ref Range   Glucose-Capillary 91 65 - 99 mg/dL  Glucose, capillary     Status: Abnormal   Collection Time: 07/21/16  4:46 PM  Result Value Ref Range   Glucose-Capillary 103 (H) 65 - 99 mg/dL  Glucose, capillary     Status: None   Collection Time: 07/21/16  8:17 PM  Result Value Ref Range   Glucose-Capillary 95 65 - 99 mg/dL  Glucose, capillary     Status: Abnormal   Collection Time: 07/22/16 12:07 AM  Result Value Ref Range   Glucose-Capillary 103 (H) 65 - 99 mg/dL  Procalcitonin     Status: None   Collection Time: 07/22/16  4:40 AM  Result Value Ref Range   Procalcitonin <0.10 ng/mL    Comment:  Interpretation: PCT (Procalcitonin) <= 0.5 ng/mL: Systemic infection (sepsis) is not likely. Local bacterial infection is possible. (NOTE)         ICU PCT Algorithm               Non ICU PCT Algorithm    ----------------------------     ------------------------------         PCT < 0.25 ng/mL                 PCT < 0.1 ng/mL     Stopping of antibiotics            Stopping of antibiotics       strongly encouraged.               strongly encouraged.    ----------------------------     ------------------------------       PCT level decrease by               PCT < 0.25 ng/mL       >= 80% from peak PCT       OR PCT 0.25 - 0.5 ng/mL          Stopping of antibiotics                                             encouraged.     Stopping of antibiotics            encouraged.    ----------------------------     ------------------------------       PCT level decrease by              PCT >= 0.25 ng/mL       < 80% from peak PCT        AND PCT >= 0.5 ng/mL            Continuin g antibiotics                                              encouraged.       Continuing antibiotics            encouraged.    ----------------------------     ------------------------------     PCT level increase compared          PCT > 0.5 ng/mL         with peak PCT AND          PCT >= 0.5 ng/mL             Escalation of antibiotics                                          strongly encouraged.      Escalation of antibiotics        strongly encouraged.   Glucose, capillary     Status: Abnormal   Collection Time: 07/22/16  4:51 AM  Result Value Ref Range   Glucose-Capillary 110 (H) 65 - 99 mg/dL  Glucose, capillary     Status: None   Collection Time: 07/22/16  7:52 AM  Result Value Ref Range   Glucose-Capillary 98 65 - 99 mg/dL  Glucose, capillary     Status: Abnormal   Collection Time: 07/22/16 11:43 AM  Result Value Ref Range   Glucose-Capillary 124 (H) 65 - 99 mg/dL  Glucose, capillary     Status: Abnormal   Collection Time: 07/22/16  3:36 PM  Result Value Ref Range   Glucose-Capillary 114 (H) 65 - 99 mg/dL  Glucose, capillary     Status: Abnormal   Collection Time: 07/22/16  8:17 PM  Result Value Ref Range   Glucose-Capillary 106 (H) 65 - 99 mg/dL  Glucose, capillary     Status: None   Collection Time: 07/22/16 11:52 PM  Result Value Ref Range   Glucose-Capillary 91 65 - 99 mg/dL  Basic metabolic panel     Status: Abnormal   Collection Time: 07/23/16  4:31 AM  Result Value Ref Range   Sodium 136 135 - 145 mmol/L   Potassium 4.2 3.5 - 5.1 mmol/L   Chloride 99 (L) 101 - 111 mmol/L   CO2 33 (H) 22 - 32 mmol/L   Glucose, Bld 128 (H) 65 - 99 mg/dL   BUN 21 (H) 6 - 20 mg/dL   Creatinine, Ser 0.76 0.61 - 1.24 mg/dL   Calcium 8.9 8.9 - 10.3 mg/dL   GFR calc  non Af Amer >60 >60 mL/min   GFR calc Af Amer >60 >60 mL/min    Comment: (NOTE) The eGFR has been calculated using the CKD EPI equation. This calculation has not been validated in all clinical situations. eGFR's persistently <60 mL/min signify possible Chronic Kidney Disease.    Anion gap 4 (L) 5 - 15  Glucose, capillary     Status: Abnormal   Collection Time: 07/23/16  4:32 AM  Result Value Ref Range   Glucose-Capillary 119 (H) 65 - 99 mg/dL     HEENT: Normocephalic. Healing trauma Neck: #6 shiley, significant secretions Cardio: RRR. No JVD.  Resp: Ronchi and Unlabored GI: BS positive and ND Skin:   Other trach site without bleeding Neuro: Somnolent Sensory cannot assess due to somnolence , Motor: Unable to assess this AM due to somnolence  Musc/Skel:  No edema. No tenderness.  Gen NAD, Vital signs reviewed.    Assessment/Plan: 1. Functional deficits secondary to bilateral cortical and subcortical cardioembolic infarcts which require 3+ hours per day of interdisciplinary therapy in a comprehensive inpatient rehab setting. Physiatrist is providing close team supervision and 24 hour management of active medical problems listed below. Physiatrist and rehab team continue to assess barriers to discharge/monitor patient progress toward functional and medical goals. FIM: Function - Bathing Position: Sitting EOB Body parts bathed by patient: Right arm, Left arm, Chest, Abdomen, Right upper leg, Left upper leg Body parts bathed by helper: Left lower leg, Right lower leg, Buttocks, Front perineal area Assist Level: 2 helpers  Function- Upper Body Dressing/Undressing What is the patient wearing?: Pull over shirt/dress Pull over shirt/dress - Perfomed by patient: Thread/unthread right sleeve, Thread/unthread left sleeve Pull over shirt/dress - Perfomed by helper: Put head through opening, Pull shirt over trunk Assist Level: 2 helpers Function - Lower Body Dressing/Undressing What is  the patient wearing?: Non-skid slipper socks Pants- Performed by helper: Thread/unthread right pants leg, Pull pants up/down, Thread/unthread left pants leg, Fasten/unfasten pants Non-skid slipper socks- Performed by helper: Don/doff right sock, Don/doff left sock  Function - Toileting Toileting activity did not occur: No continent bowel/bladder event Toileting steps completed by helper: Adjust clothing prior to toileting, Performs perineal hygiene, Adjust clothing after toileting Toileting Assistive  Devices: Grab bar or Engineer, building services transfer activity did not occur: Safety/medical concerns Toilet transfer assistive device: Elevated toilet seat/BSC over toilet Assist level to toilet: Maximal assist (Pt 25 - 49%/lift and lower) Assist level from toilet: Maximal assist (Pt 25 - 49%/lift and lower) Assist level to bedside commode (at bedside): 2 helpers Assist level from bedside commode (at bedside): 2 helpers  Function - Chair/bed transfer Chair/bed transfer method: Other Chair/bed transfer assist level: 2 helpers Chair/bed transfer assistive device: Mechanical lift Mechanical lift: Stedy Chair/bed transfer details: Verbal cues for technique, Tactile cues for posture, Verbal cues for sequencing, Manual facilitation for weight shifting  Function - Locomotion: Wheelchair Wheelchair activity did not occur: Safety/medical concerns Wheel 50 feet with 2 turns activity did not occur: Safety/medical concerns Wheel 150 feet activity did not occur: Safety/medical concerns Function - Locomotion: Ambulation Ambulation activity did not occur: Safety/medical concerns Walk 10 feet activity did not occur: Safety/medical concerns Walk 50 feet with 2 turns activity did not occur: Safety/medical concerns Walk 150 feet activity did not occur: Safety/medical concerns Walk 10 feet on uneven surfaces activity did not occur: Safety/medical concerns  Function -  Comprehension Comprehension: Auditory Comprehension assist level: Understands basic 50 - 74% of the time/ requires cueing 25 - 49% of the time  Function - Expression Expression: Verbal Expression assist level: Expresses basis less than 25% of the time/requires cueing >75% of the time.  Function - Social Interaction Social Interaction assist level: Interacts appropriately less than 25% of the time. May be withdrawn or combative.  Function - Problem Solving Problem solving assist level: Solves basic less than 25% of the time - needs direction nearly all the time or does not effectively solve problems and may need a restraint for safety  Function - Memory Memory assist level: Recognizes or recalls 50 - 74% of the time/requires cueing 25 - 49% of the time Patient normally able to recall (first 3 days only): That he or she is in a hospital  Medical Problem List and Plan: 1. 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 DVT Prophylaxis/Anticoagulation: Pharmaceutical: Other (comment)--Eliquis d/ced due to increase in bloody secretions, no further streaking, deep suctioning stopped 3. Pain Management: tylenol prn pain. Question muscle spasms--will monitor 4. Mood: LCSW to follow for evaluation as mentation improves.  5. Neuropsych: This patient is notcapable of making decisions on hisown behalf. 6. Skin/Wound Care: routine pressure relief measures.  7. Fluids/Electrolytes/Nutrition: Monitor I/O and adjust fluid boluses as needed. Continue tube feeds. . 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 Tolerated PMV on #6 trach  While still having secretions, sats have remained normal, pulmonary following for this 9. Parkinson's disease: Continue Sinemet 37.5/150 qid.  10. Leucocytosis: Resolving. Monitor for signs of infection.   11. ABLA: Continue to monitor--has been fluctuating from 9.6-->10.4, 9.3 on 3/2, stool guaic remains pending  Labs ordered for tomorrow 12. Hematuria: Continue to monitor for now. May need flushes resumed if worsens.  13. Severe dysphagia: NPO till mentation improves.will work with SLP for ~1 wk then decide on PEG Oral care qid. Ice chips with ST.   Concerns of dislodgement of NG, KUB ordered, reviewed, no dislodgement  LOS (Days) 4 A FACE TO FACE EVALUATION WAS PERFORMED  Stepfon Rawles E 07/23/2016, 7:42 AM

## 2016-07-23 NOTE — Progress Notes (Signed)
Received in report that family was concerned about trach placement, RN called RT she came and said the trach is just fine. Patient continue to have copious amount of thick white yellow secretions. No blood has been present during my shift. We continue to monitor.
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
## 2016-07-23 NOTE — H&P (Signed)
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 Material enters airway, remains ABOVE vocal cords and not ejected out Pharyngeal- Nectar Cup Delayed swallow initiation-vallecula;Penetration/Apiration after swallow;Pharyngeal residue - valleculae;Pharyngeal residue - pyriform;Lateral channel residue;Trace aspiration;Reduced anterior laryngeal mobility;Reduced laryngeal elevation;Reduced epiglottic inversion;Reduced airway/laryngeal closure Pharyngeal Material enters airway, remains ABOVE vocal cords and not ejected out Pharyngeal- Nectar Straw -- Pharyngeal -- Pharyngeal- Thin Teaspoon Delayed swallow initiation-vallecula;Reduced airway/laryngeal closure;Reduced tongue base retraction;Reduced laryngeal elevation;Reduced anterior laryngeal mobility;Reduced epiglottic inversion;Penetration/Apiration after swallow;Pharyngeal residue - valleculae;Pharyngeal residue - pyriform;Lateral channel residue;Pharyngeal residue - cp segment;Trace aspiration Pharyngeal Material enters airway, passes BELOW cords without attempt by patient to eject out (silent aspiration) Pharyngeal- Thin Cup -- Pharyngeal -- Pharyngeal- Thin Straw -- Pharyngeal -- Pharyngeal- Puree NT Pharyngeal -- Pharyngeal- Mechanical Soft -- Pharyngeal -- Pharyngeal- Regular -- Pharyngeal -- Pharyngeal- Multi-consistency -- Pharyngeal -- Pharyngeal- Pill --  Pharyngeal -- Pharyngeal Comment --  No flowsheet data found. PAYNE, COURTNEY 06/26/2016, 3:28 PM               Labs:  CBC:  Recent Labs  07/17/16 0443 07/17/16 1614 07/19/16 0429 07/20/16 0443  WBC 15.4* 15.7* 10.9* 8.7  HGB 10.4* 10.4* 9.6* 9.3*  HCT 34.0* 33.7* 32.2* 31.1*  PLT 432* 434* 366 340    COAGS:  Recent Labs  06/01/16 2127 06/02/16 0237 06/29/16 1308  INR 1.19 1.26  --   APTT  --  31 96*    BMP:  Recent Labs  07/18/16 0427 07/19/16 0429 07/20/16 0443 07/23/16 0431  NA 141 141 139 136  K 4.4 4.0 3.9 4.2  CL 104 106 104 99*  CO2 29 30 30  33*  GLUCOSE 128* 108* 108* 128*  BUN 36* 31* 27* 21*  CALCIUM 9.2 9.0 9.1 8.9  CREATININE 0.86 0.82 0.76 0.76  GFRNONAA >60 >60 >60 >60  GFRAA >60 >60 >60 >60    LIVER FUNCTION TESTS:  Recent Labs  06/04/16 0338 06/19/16 0238  06/21/16 0507  07/17/16 0443 07/18/16 0427 07/19/16 0429 07/20/16 0443  BILITOT 1.5* 1.3*  --  0.9  --   --   --   --  0.4  AST 48* 22  --  23  --   --   --   --  16  ALT 7* 8*  --  13*  --   --   --   --  23  ALKPHOS 52 145*  --  128*  --   --   --   --  102  PROT 5.9* 7.2  --  6.6  --   --   --   --  6.6  ALBUMIN 2.9* 3.0*  < > 2.9*  < > 2.8* 2.6* 2.7* 2.4*  < > = values in this interval not displayed.  TUMOR MARKERS: No results for input(s): AFPTM, CEA, CA199, CHROMGRNA in the last 8760 hours.  Assessment and Plan:  Dysphagia  Will proceed with placement of Gastrostomy tube tomorrow  Risks and Benefits discussed with the patient's wife including, but not limited to the need for a barium enema during the procedure, bleeding, infection, peritonitis, or damage to adjacent structures.  All of the patient's wife questions were answered, patient is agreeable to proceed. Consent signed and in chart.  Thank you for this interesting consult.  I greatly enjoyed meeting Jettson Crable and look forward to participating in their care.  A copy of this report was sent to the  requesting provider on this date.  Electronically Signed: Gwynneth Macleod PA-C 07/23/2016, 3:05 PM   I spent a total of 40 Minutes in face to face in clinical consultation, greater than 50% of which was counseling/coordinating care for gastrostomy tube placement

## 2016-07-23 NOTE — Progress Notes (Signed)
RT provided patient's family with extensive education on trach. Family expressed understandning.No other concerns raised.

## 2016-07-23 NOTE — Progress Notes (Signed)
Speech Language Pathology Daily Session Note  Patient Details  Name: Gregory [name] MRN: [id number] Date of Birth: 05/24/1940  Today's Date: 07/23/2016 SLP Individual Time: 1000-1045 SLP Individual Time Calculation (min): 45 min  Short Term Goals: Week 1: SLP Short Term Goal 1 (Week 1): Patient will consume trials of ice chips without overt s/s of aspiration to assess readiness for repeat objective swallow assessment.  SLP Short Term Goal 2 (Week 1): Patient will increase speech intelligibility to ~100% at the word level with Min A multimodal cues for use of speech intelligibility strategies.  SLP Short Term Goal 3 (Week 1): Pt will follow basic 1 step directions with Mod A verbal cues for 75% accuracy.  SLP Short Term Goal 4 (Week 1): Patient will maintain arousal in regards to keeping his eyes open for ~60 seconds with Mod A verbal cues.  SLP Short Term Goal 5 (Week 1): Pt will tolerate PMSV for ~30 minutes without decline in vitals.  SLP Short Term Goal 6 (Week 1): Pt will complete basic familiar tasks (related to ADLs, brushing hair, oral care) with Mod A verbal cues.   Skilled Therapeutic Interventions: Skilled treatment session focused on dysphagia and speech goals. SLP facilitated session by answering wife and son's questions that applied to speech therapy. Pt upright in wheelchair and requires Total A to open eyes and maintain alertness/focused attention for ~15 seconds. Pt unable to answer biographical questions accurately with Total A cues. Pt continues with copious amounts of secretions that are coming out of trach hub, coming out from around trach and held posteriorly in mouth. Pt drooling at rest. Total A cues to attempt cough but cough ineffective in clearing secretions. Pt suctioned. PMSV applied with ability to produce wet voice initially then O2 stats dropped and pt unable to produce voicing d/t upper airway secretions. PMSV removed, secretions expelled out of trach with O2  returning to normal range with Total A cues for breathing. After all secretions cleared, PMSV donned and remained on without further incidence for ~ 15 minutes. Pt unable to produce volitional swallow. Son states that pt was able to produce "5 swallows" on previous day. PA entered room, information shared with plan for pt to receive PEG for nutritional support. Will continue to follow for increased tolerance of PMSV, increased mentation and swallow function. Pt left upright in wheelchair, family present, PA present and all needs within reach. Continue per current plan of care.      Function:  Eating Eating Eating activity did not occur: Safety/medical concerns Modified Consistency Diet: No             Cognition Comprehension Comprehension assist level: Understands basic less than 25% of the time/ requires cueing >75% of the time  Expression   Expression assist level: Expresses basis less than 25% of the time/requires cueing >75% of the time.  Social Interaction Social Interaction assist level: Interacts appropriately 25 - 49% of time - Needs frequent redirection.  Problem Solving Problem solving assist level: Solves basic less than 25% of the time - needs direction nearly all the time or does not effectively solve problems and may need a restraint for safety  Memory Memory assist level: Recognizes or recalls less than 25% of the time/requires cueing greater than 75% of the time    Pain    Therapy/Group: Individual Therapy   Christopherjame Carnell B. Dreama Saaverton, M.S., CCC-SLP Speech-Language Pathologist   Shelisha Gautier 07/23/2016, 3:18 PM

## 2016-07-23 NOTE — Progress Notes (Signed)
Physical Therapy Note  Patient Details  Name: Gregory [name] MRN: [id number] Date of Birth: Aug 10, 1940 Today's Date: 07/23/2016  [id number], 48 min individual tx Pain: no c/o  Pt soiled and unaware.   Pt followed 1 step commands to assist with rolling for hygiene change and donning brief in bed.  Pt sat EOB x 5 minutes without LOB, multimodal cues to decrease backward lean.  Attempted sit> stand from EOB +2 on either side of him; pt unable to fully extend LEs, and bil feet slipped on floor despite non slip socks and therapists blocking his feet. Stedy for bed> w/c.   Pt left resting in tilt in space w/c with wife present.  Wife advised to make sure pt does not pull at tubes/lines.  See function navigator for current status.  Kendelle Schweers 07/23/2016, 7:53 AM

## 2016-07-23 NOTE — Progress Notes (Signed)
Occupational Therapy Session Note  Patient Details  Name: Gregory LoronRichard Morera MRN: [id number] Date of Birth: July 09, 1940  Today's Date: 07/23/2016 OT Individual Time: 1301-1407 OT Individual Time Calculation (min): 66 min    Short Term Goals: Week 1:  OT Short Term Goal 1 (Week 1): Pt will maintain sustained attention to selfcare tasks for 20 mins with no more than min instructional cueing for re-direction.  OT Short Term Goal 2 (Week 1): Pt will maintain dynamic sitting EOB or EOC during ADL with no more than mod assist.  OT Short Term Goal 3 (Week 1): Pt will complete UB bathing with supervision sitting unsupported.  OT Short Term Goal 4 (Week 1): Pt will completed LB bathing sit to stand with max assist of one.  OT Short Term Goal 5 (Week 1): Pt will donn pullup pants with max assist using AE sit to stand.   Skilled Therapeutic Interventions/Progress Updates:    Pt in tilt in space wheelchair at start of session.  He was not oriented to place stating he was at the "[employer]" and he had been doing a  Water engineer"dedication with the president".  Pt easily re-oriented to place.  In addition, he was not aware of situation of day of the week, stating it was "Wednesday".  Began session with sit to stand from the wheelchair X2.  Pt initially only able to achieve 50% of standing, with decreased knee extension and hip extension noted bilaterally with max assist from therapist.  On second attempt he was able to stand more upright but with increased pushing to the right side, and inability to move either LE efficiently to complete stand pivot transfer.  Therapist assisted with squat pivot to the right side with total assist to the EOB from the wheelchair.  Increased pushing/lean to the left in sitting noted, with decreased awareness of midline as he stated he was leaning to the right when asked if he was sitting up straight.  Static sitting balance min assist overall but focused this part of session on midline sitting and  lateral reaching to the right side in order to decrease his pushing tendencies.  Pt needed mod assist to reach to the right beyond normal BOS to grasp items presented such as soap, deodorizer, and washcloth.  Noted shoulder flexion limitation to approximately 90 degrees when reaching with the RUE.  Utilized Steady for next part of session with focus on sit to stand in order to clean up bowel accident.  Pt needed several sit to stand transitions with total +2(pt 30%) in order to clean peri area, donn new brief, and pull up his shorts.  Pt with decreased awareness throughout and kept attempting to pull up his shorts before cleaning was completed.  Max instructional cueing for re-direction.  At times he also needed max demonstrational cueing for hand positioning to grasp the bar of the Steady to assist with pulling up.  Once toileting was completed he was transferred back to the tilt in space wheelchair with use of the Steady.  Wrist restraints and safety belt were donned as well as pt being tilted back in chair.  His son [name] came in the room to visit as well and therapist discussed aspects of session and how the pt performed.  Pt with eyes closed at end of session as well.    Therapy Documentation Precautions:  Precautions Precautions: Back, Fall Precaution Comments: NG Tube, bil wrist restraints Restrictions Weight Bearing Restrictions: No  Vital Signs: Therapy Vitals Temp: 98.2 F (36.8  C) Temp Source: Oral Pulse Rate: 74 Patient Position (if appropriate): Sitting Oxygen Therapy SpO2: 100 % O2 Device: Tracheostomy Collar O2 Flow Rate (L/min): 6 L/min FiO2 (%): 28 % Pulse Oximetry Type: Continuous Pain: Pain Assessment Pain Assessment: Faces Faces Pain Scale: Hurts a little bit Pain Type: Acute pain Pain Location: Rib cage Pain Orientation: Left Pain Descriptors / Indicators: Discomfort Pain Onset: With Activity Pain Intervention(s): Repositioned;Emotional support ADL: See Function  Navigator for Current Functional Status.   Therapy/Group: Individual Therapy  Ambriana Selway OTR/L 07/23/2016, 3:58 PM

## 2016-07-23 NOTE — Progress Notes (Signed)
Physical Therapy Session Note  Patient Details  Name: Gregory [name] MRN: [id number] Date of Birth: 1940-12-25  Today's Date: 07/23/2016 PT Individual Time: 1115-1200 PT Individual Time Calculation (min): 45 min   Short Term Goals: Week 1:  PT Short Term Goal 1 (Week 1): Patient will maintain sitting balance with mod assist  PT Short Term Goal 2 (Week 1): Patient will perform sit<>stand with max assist of 1  PT Short Term Goal 3 (Week 1): Patient will initiate gait training PT Short Term Goal 4 (Week 1): patient will perform standing balance with max assist of 1 for up to 30 seconds.   Skilled Therapeutic Interventions/Progress Updates: Pt presented in TIS w/c on 6L supplemental O2. Transported to rehab gym. Performed squat pivot transfer maxA x 2 to mat. Pt able to sit at EOM ~8 min with mirror however unable to maintain neutral spine for >30sec. Performed sit to/from stand in [place] with modA x 2. First trial pt able to initiate stand and achieve full upright posture. Second trial pt with increased L lean requiring tactile cues for correction. [facility] [place]. Pt returned to room with restraints reapplied and QRB in place with NT present for BS.      Therapy Documentation Precautions:  Precautions Precautions: Back, Fall Precaution Comments: NG Tube, bil wrist restraints Restrictions Weight Bearing Restrictions: No   See Function Navigator for Current Functional Status.    Therapy/Group: Individual Therapy  Salimata Christenson  Zyshawn Bohnenkamp, PTA  07/23/2016, 2:52 PM

## 2016-07-24 ENCOUNTER — Inpatient Hospital Stay (HOSPITAL_COMMUNITY): Payer: Medicare Other | Admitting: Speech Pathology

## 2016-07-24 ENCOUNTER — Inpatient Hospital Stay (HOSPITAL_COMMUNITY): Payer: Medicare Other | Admitting: Physical Therapy

## 2016-07-24 ENCOUNTER — Inpatient Hospital Stay (HOSPITAL_COMMUNITY): Payer: Medicare Other | Admitting: Occupational Therapy

## 2016-07-24 DIAGNOSIS — G9340 Encephalopathy, unspecified: Secondary | ICD-10-CM

## 2016-07-24 LAB — GLUCOSE, CAPILLARY
GLUCOSE-CAPILLARY: 106 mg/dL — AB (ref 65–99)
GLUCOSE-CAPILLARY: 112 mg/dL — AB (ref 65–99)
GLUCOSE-CAPILLARY: 126 mg/dL — AB (ref 65–99)
GLUCOSE-CAPILLARY: 96 mg/dL (ref 65–99)
GLUCOSE-CAPILLARY: 99 mg/dL (ref 65–99)
Glucose-Capillary: 112 mg/dL — ABNORMAL HIGH (ref 65–99)

## 2016-07-24 LAB — PROCALCITONIN

## 2016-07-24 NOTE — Progress Notes (Signed)
Recreational Therapy Session Note  Patient Details  Name: Gregory [name] MRN: [id number] Date of Birth: Oct 13, 1940 Today's Date: 07/24/2016   TR eval deferred at this time as pt is not yet appropriate for TR services.  Will continue to monitor through team for future participation. Aamira Bischoff 07/24/2016, 8:58 AM
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
## 2016-07-24 NOTE — Progress Notes (Addendum)
Occupational Therapy Session Note  Patient Details  Name: Gregory LoronRichard Stanger MRN: [id number] Date of Birth: 03-06-1941  Today's Date: 07/24/2016 OT Individual Time: [id number] OT Individual Time Calculation (min): 30 min    Short Term Goals: Week 1:  OT Short Term Goal 1 (Week 1): Pt will maintain sustained attention to selfcare tasks for 20 mins with no more than min instructional cueing for re-direction.  OT Short Term Goal 2 (Week 1): Pt will maintain dynamic sitting EOB or EOC during ADL with no more than mod assist.  OT Short Term Goal 3 (Week 1): Pt will complete UB bathing with supervision sitting unsupported.  OT Short Term Goal 4 (Week 1): Pt will completed LB bathing sit to stand with max assist of one.  OT Short Term Goal 5 (Week 1): Pt will donn pullup pants with max assist using AE sit to stand.   Skilled Therapeutic Interventions/Progress Updates:    Pt transferred from supine to sit EOB with max assist this session.  Decreased initiation and completion of rolling to his right side noted this time compared to previous session.  In sitting,he demonstrated increased left lean/pushing.  Worked on forward trunk flexion at midline with BUE supported.  He demonstrated resistance to forward trunk flexion with facilitation from therapist but improved with instructions for pt to push therapist away.  He was able to complete small movements of flexion with 2 intervals of sit to stand with overall max assist.  Once standing he still demonstrated pushing to the left with decreased ability to maintain full knee extension.  First standing interval lasted for 1 min 30 secs.  Second interval completed for less than one minute while taking 2-3 small steps up to the [facility] with mod assist to advance LEs.  Pt transferred back to bed at end of session with mod assist overall and max assist for sequencing transition.  [name] [name] present for majority of session and provided verbal encouragement  throughout.   Pt left with nursing present, wrist restraints in place.    Therapy Documentation Precautions:  Precautions Precautions: Fall Precaution Comments: NG Tube, bil wrist restraints Restrictions Weight Bearing Restrictions: No  Pain: Pain Assessment Pain Assessment: No/denies pain ADL: See Function Navigator for Current Functional Status.   Therapy/Group: Individual Therapy  Joren Rehm OTR/L 07/24/2016, 4:36 PM

## 2016-07-24 NOTE — Progress Notes (Signed)
Physical Therapy Note  Patient Details  Name: Emelia LoronRichard Henkes MRN: [id number] Date of Birth: [birth year] Today's Date: 07/24/2016    Time: 1000-1058 58 minutes  1:1 No c/o pain.  Pt states he needs to use restroom. Pt transferred with stedy with mod A for sit to stand, +2 for safety and equipment. Pt able to be continent of bowel and performed toilet transfer with stedy with mod A.  Sit to stands from hi lo mat with min A from high surfaces, mod A from lower surfaces.  Static standing  Balance with cuing for forward wt shift as pt has posterior and left lean.  Pt improves with manual facilitation and increased repetition.  Gait with RW 2 x 10' with mod A, +2 for safety/equipment.  Pt fatigues quickly and requires manual facilitation for wt shifts during gait.  SpO299% during gait.  Pt states another need to use restroom, pt assisted with stedy and mod A.   Camreigh Michie 07/24/2016, 10:59 AM

## 2016-07-24 NOTE — Progress Notes (Signed)
Speech Language Pathology Daily Session Notes  Patient Details  Name: Gregory LoronRichard Oriordan MRN: [id number] Date of Birth: February 19, 1941  Today's Date: 07/24/2016  Session 1 SLP Individual Time: 0930-1000 SLP Individual Time Calculation (min): 30 min  Session 2 SLP Individual Time: [id number] SLP Individual Time Calculation (min): 43 min   Short Term Goals: Week 1: SLP Short Term Goal 1 (Week 1): Patient will consume trials of ice chips without overt s/s of aspiration to assess readiness for repeat objective swallow assessment.  SLP Short Term Goal 2 (Week 1): Patient will increase speech intelligibility to ~100% at the word level with Min A multimodal cues for use of speech intelligibility strategies.  SLP Short Term Goal 3 (Week 1): Pt will follow basic 1 step directions with Mod A verbal cues for 75% accuracy.  SLP Short Term Goal 4 (Week 1): Patient will maintain arousal in regards to keeping his eyes open for ~60 seconds with Mod A verbal cues.  SLP Short Term Goal 5 (Week 1): Pt will tolerate PMSV for ~30 minutes without decline in vitals.  SLP Short Term Goal 6 (Week 1): Pt will complete basic familiar tasks (related to ADLs, brushing hair, oral care) with Mod A verbal cues.   Skilled Therapeutic Interventions: Session 1 Skilled treatment session focused on addressing speech and swallow goals. SLP facilitated session by providing suctioning from just outside of the trach hub.  SLP also facilitated session with placement of PMSV and Max assist multimodal cues to cough, throat clear, and swallow secretions; however, change in vitals occurred following 2, ~10 minute intervals.  Following a rest patient able to tolerate valve again for a short time.  Vocal quality remained wet throughout session, which impacted intelligibility at the word level.  He became fatigued/falling asleep and question if this impacted tolerance during this session.  Continue with current plan of care.   Session 2 Skilled  treatment session focused on addressing speech and swallow goals. SLP facilitated session by answering daughter's questions that applied to speech therapy and prognosis of swallow recovery. Patient required Max assist multimodal cues to maintain open eyes and alertness/focused attention for 15-30 seconds. Patient with copious amounts of secretions that he was minimally able to clear with Max assist multimodal cues to clear.  SLP donned PMSV for ~20 minutes with vitals remaining WNL; however, wet vocal quality remained despite Max assist multimodal cues to cough in an attempt to clear secretions.  Continue per current plan of care.   Function:  Eating Eating Eating activity did not occur: Safety/medical concerns               Cognition Comprehension Comprehension assist level: Understands basic less than 25% of the time/ requires cueing >75% of the time  Expression   Expression assist level: Expresses basic 25 - 49% of the time/requires cueing 50 - 75% of the time. Uses single words/gestures.  Social Interaction Social Interaction assist level: Interacts appropriately 25 - 49% of time - Needs frequent redirection.  Problem Solving Problem solving assist level: Solves basic less than 25% of the time - needs direction nearly all the time or does not effectively solve problems and may need a restraint for safety  Memory Memory assist level: Recognizes or recalls less than 25% of the time/requires cueing greater than 75% of the time    Pain Pain Assessment Pain Assessment: No/denies pain Pain Score: 0-No pain  x2  Therapy/Group: Individual Therapy x2  Fae PippinMelissa Debra Calabretta, M.A., CCC-SLP [phone]  Alyas Creary 07/24/2016, 11:24  AM

## 2016-07-24 NOTE — Progress Notes (Signed)
Pt is on ATC 28% tolerating it well. No distress noted

## 2016-07-24 NOTE — Progress Notes (Signed)
Subjective/Complaints: Appreciate CCM follow up  ROS: Limited due to cognition  Objective: Vital Signs: Blood pressure 111/67, pulse 66, temperature 98 F (36.7 C), temperature source Oral, resp. rate 18, height _0  (1.753 m), weight 85.5 kg (188 lb 7.9 oz), SpO2 99 %. No results found. Results for orders placed or performed during the hospital encounter of 07/19/16 (from the past 72 hour(s))  Glucose, capillary     Status: Abnormal   Collection Time: 07/21/16  8:30 AM  Result Value Ref Range   Glucose-Capillary 112 (H) 65 - 99 mg/dL  Glucose, capillary     Status: None   Collection Time: 07/21/16 12:00 PM  Result Value Ref Range   Glucose-Capillary 91 65 - 99 mg/dL  Glucose, capillary     Status: Abnormal   Collection Time: 07/21/16  4:46 PM  Result Value Ref Range   Glucose-Capillary 103 (H) 65 - 99 mg/dL  Glucose, capillary     Status: None   Collection Time: 07/21/16  8:17 PM  Result Value Ref Range   Glucose-Capillary 95 65 - 99 mg/dL  Glucose, capillary     Status: Abnormal   Collection Time: 07/22/16 12:07 AM  Result Value Ref Range   Glucose-Capillary 103 (H) 65 - 99 mg/dL  Procalcitonin     Status: None   Collection Time: 07/22/16  4:40 AM  Result Value Ref Range   Procalcitonin <0.10 ng/mL    Comment:        Interpretation: PCT (Procalcitonin) <= 0.5 ng/mL: Systemic infection (sepsis) is not likely. Local bacterial infection is possible. (NOTE)         ICU PCT Algorithm               Non ICU PCT Algorithm    ----------------------------     ------------------------------         PCT < 0.25 ng/mL                 PCT < 0.1 ng/mL     Stopping of antibiotics            Stopping of antibiotics       strongly encouraged.               strongly encouraged.    ----------------------------     ------------------------------       PCT level decrease by               PCT < 0.25 ng/mL       >= 80% from peak PCT       OR PCT 0.25 - 0.5 ng/mL          Stopping of  antibiotics                                             encouraged.     Stopping of antibiotics           encouraged.    ----------------------------     ------------------------------       PCT level decrease by              PCT >= 0.25 ng/mL       < 80% from peak PCT        AND PCT >= 0.5 ng/mL            Continuin g antibiotics  encouraged.       Continuing antibiotics            encouraged.    ----------------------------     ------------------------------     PCT level increase compared          PCT > 0.5 ng/mL         with peak PCT AND          PCT >= 0.5 ng/mL             Escalation of antibiotics                                          strongly encouraged.      Escalation of antibiotics        strongly encouraged.   Glucose, capillary     Status: Abnormal   Collection Time: 07/22/16  4:51 AM  Result Value Ref Range   Glucose-Capillary 110 (H) 65 - 99 mg/dL  Glucose, capillary     Status: None   Collection Time: 07/22/16  7:52 AM  Result Value Ref Range   Glucose-Capillary 98 65 - 99 mg/dL  Glucose, capillary     Status: Abnormal   Collection Time: 07/22/16 11:43 AM  Result Value Ref Range   Glucose-Capillary 124 (H) 65 - 99 mg/dL  Glucose, capillary     Status: Abnormal   Collection Time: 07/22/16  3:36 PM  Result Value Ref Range   Glucose-Capillary 114 (H) 65 - 99 mg/dL  Glucose, capillary     Status: Abnormal   Collection Time: 07/22/16  8:17 PM  Result Value Ref Range   Glucose-Capillary 106 (H) 65 - 99 mg/dL  Glucose, capillary     Status: None   Collection Time: 07/22/16 11:52 PM  Result Value Ref Range   Glucose-Capillary 91 65 - 99 mg/dL  Basic metabolic panel     Status: Abnormal   Collection Time: 07/23/16  4:31 AM  Result Value Ref Range   Sodium 136 135 - 145 mmol/L   Potassium 4.2 3.5 - 5.1 mmol/L   Chloride 99 (L) 101 - 111 mmol/L   CO2 33 (H) 22 - 32 mmol/L   Glucose, Bld 128 (H) 65 - 99 mg/dL   BUN  21 (H) 6 - 20 mg/dL   Creatinine, Ser 0.76 0.61 - 1.24 mg/dL   Calcium 8.9 8.9 - 10.3 mg/dL   GFR calc non Af Amer >60 >60 mL/min   GFR calc Af Amer >60 >60 mL/min    Comment: (NOTE) The eGFR has been calculated using the CKD EPI equation. This calculation has not been validated in all clinical situations. eGFR's persistently <60 mL/min signify possible Chronic Kidney Disease.    Anion gap 4 (L) 5 - 15  Glucose, capillary     Status: Abnormal   Collection Time: 07/23/16  4:32 AM  Result Value Ref Range   Glucose-Capillary 119 (H) 65 - 99 mg/dL  Glucose, capillary     Status: Abnormal   Collection Time: 07/23/16  8:16 AM  Result Value Ref Range   Glucose-Capillary 124 (H) 65 - 99 mg/dL  Glucose, capillary     Status: Abnormal   Collection Time: 07/23/16 12:06 PM  Result Value Ref Range   Glucose-Capillary 101 (H) 65 - 99 mg/dL  Glucose, capillary     Status: Abnormal   Collection Time: 07/23/16  4:04 PM  Result Value Ref Range   Glucose-Capillary 117 (H) 65 - 99 mg/dL  Glucose, capillary     Status: Abnormal   Collection Time: 07/23/16  8:01 PM  Result Value Ref Range   Glucose-Capillary 113 (H) 65 - 99 mg/dL  Glucose, capillary     Status: Abnormal   Collection Time: 07/23/16 11:41 PM  Result Value Ref Range   Glucose-Capillary 119 (H) 65 - 99 mg/dL  Procalcitonin     Status: None   Collection Time: 07/24/16  4:40 AM  Result Value Ref Range   Procalcitonin <0.10 ng/mL    Comment:        Interpretation: PCT (Procalcitonin) <= 0.5 ng/mL: Systemic infection (sepsis) is not likely. Local bacterial infection is possible. (NOTE)         ICU PCT Algorithm               Non ICU PCT Algorithm    ----------------------------     ------------------------------         PCT < 0.25 ng/mL                 PCT < 0.1 ng/mL     Stopping of antibiotics            Stopping of antibiotics       strongly encouraged.               strongly encouraged.    ----------------------------      ------------------------------       PCT level decrease by               PCT < 0.25 ng/mL       >= 80% from peak PCT       OR PCT 0.25 - 0.5 ng/mL          Stopping of antibiotics                                             encouraged.     Stopping of antibiotics           encouraged.    ----------------------------     ------------------------------       PCT level decrease by              PCT >= 0.25 ng/mL       < 80% from peak PCT        AND PCT >= 0.5 ng/mL            Continuin g antibiotics                                              encouraged.       Continuing antibiotics            encouraged.    ----------------------------     ------------------------------     PCT level increase compared          PCT > 0.5 ng/mL         with peak PCT AND          PCT >= 0.5 ng/mL             Escalation of antibiotics  strongly encouraged.      Escalation of antibiotics        strongly encouraged.   Glucose, capillary     Status: Abnormal   Collection Time: 07/24/16  4:46 AM  Result Value Ref Range   Glucose-Capillary 112 (H) 65 - 99 mg/dL     HEENT: Normocephalic. Healing trauma Neck: #6 shiley, significant secretions Cardio: RRR. No JVD.  Resp: Ronchi and Unlabored GI: BS positive and ND Skin:   Other trach site without bleeding Neuro: Somnolent Sensory cannot assess due to somnolence , Motor: Unable to assess this AM due to somnolence  Musc/Skel:  No edema. No tenderness.  Gen NAD, Vital signs reviewed.    Assessment/Plan: 1. Functional deficits secondary to bilateral cortical and subcortical cardioembolic infarcts which require 3+ hours per day of interdisciplinary therapy in a comprehensive inpatient rehab setting. Physiatrist is providing close team supervision and 24 hour management of active medical problems listed below. Physiatrist and rehab team continue to assess barriers to discharge/monitor patient progress toward functional  and medical goals. FIM: Function - Bathing Position: Sitting EOB Body parts bathed by patient: Right arm, Left arm, Chest, Abdomen, Right upper leg, Left upper leg Body parts bathed by helper: Left lower leg, Right lower leg, Buttocks, Front perineal area Assist Level: 2 helpers  Function- Upper Body Dressing/Undressing What is the patient wearing?: Pull over shirt/dress Pull over shirt/dress - Perfomed by patient: Thread/unthread right sleeve, Thread/unthread left sleeve Pull over shirt/dress - Perfomed by helper: Put head through opening, Pull shirt over trunk Assist Level: 2 helpers Function - Lower Body Dressing/Undressing What is the patient wearing?: Non-skid slipper socks Pants- Performed by helper: Thread/unthread right pants leg, Pull pants up/down, Thread/unthread left pants leg, Fasten/unfasten pants Non-skid slipper socks- Performed by helper: Don/doff right sock, Don/doff left sock  Function - Toileting Toileting activity did not occur: No continent bowel/bladder event Toileting steps completed by helper: Adjust clothing prior to toileting, Performs perineal hygiene, Adjust clothing after toileting Toileting Assistive Devices: Grab bar or rail Assist level: Two helpers  Function - Air cabin crew transfer activity did not occur: Safety/medical concerns Toilet transfer assistive device: Elevated toilet seat/BSC over toilet Assist level to toilet: Maximal assist (Pt 25 - 49%/lift and lower) Assist level from toilet: Maximal assist (Pt 25 - 49%/lift and lower) Assist level to bedside commode (at bedside): 2 helpers Assist level from bedside commode (at bedside): 2 helpers  Function - Chair/bed transfer Chair/bed transfer method: Other Chair/bed transfer assist level: 2 helpers Chair/bed transfer assistive device: Mechanical lift Mechanical lift: Stedy Chair/bed transfer details: Verbal cues for technique, Tactile cues for posture, Verbal cues for sequencing,  Manual facilitation for weight shifting, Manual facilitation for placement  Function - Locomotion: Wheelchair Wheelchair activity did not occur: Safety/medical concerns Wheel 50 feet with 2 turns activity did not occur: Safety/medical concerns Wheel 150 feet activity did not occur: Safety/medical concerns Function - Locomotion: Ambulation Ambulation activity did not occur: Safety/medical concerns Walk 10 feet activity did not occur: Safety/medical concerns Walk 50 feet with 2 turns activity did not occur: Safety/medical concerns Walk 150 feet activity did not occur: Safety/medical concerns Walk 10 feet on uneven surfaces activity did not occur: Safety/medical concerns  Function - Comprehension Comprehension: Auditory Comprehension assist level: Understands basic 25 - 49% of the time/ requires cueing 50 - 75% of the time  Function - Expression Expression: Verbal Expression assist level: Expresses basis less than 25% of the time/requires cueing >75% of the time.  Function -  Social Interaction Social Interaction assist level: Interacts appropriately less than 25% of the time. May be withdrawn or combative.  Function - Problem Solving Problem solving assist level: Solves basic less than 25% of the time - needs direction nearly all the time or does not effectively solve problems and may need a restraint for safety  Function - Memory Memory assist level: Recognizes or recalls less than 25% of the time/requires cueing greater than 75% of the time Patient normally able to recall (first 3 days only): None of the above  Medical Problem List and Plan: 1. 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 DVT Prophylaxis/Anticoagulation: Pharmaceutical: Other (comment)--Eliquis d/ced due to increase in bloody secretions, no further streaking, deep suctioning stopped, consider resuming vs IV hep after  PEG placed, although pt has had bleeding from foley during last rehab stay while on IV heparin 3. Pain Management: tylenol prn pain. Question muscle spasms--will monitor 4. Mood: LCSW to follow for evaluation as mentation improves.  5. Neuropsych: This patient is notcapable of making decisions on hisown behalf. 6. Skin/Wound Care: routine pressure relief measures.  7. Fluids/Electrolytes/Nutrition: Monitor I/O and adjust fluid boluses as needed. Continue tube feeds. . 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 Tolerated PMV on #6 trach  While still having secretions, sats have remained normal, pulmonary following for this 9. Parkinson's disease: Continue Sinemet 37.5/150 qid.  10. Leucocytosis: Resolving. Monitor for signs of infection.  11. ABLA: Continue to monitor--has been fluctuating from 9.6-->10.4, 9.3 on 3/2, stool guaic remains pending   12. Hematuria: Continue to monitor for now. May need flushes resumed if worsens.  13. Severe dysphagia: NPO till mentation improves.needs PEG,Consult IR Oral care qid. Ice chips with ST.     LOS (Days) 5 A FACE TO FACE EVALUATION WAS PERFORMED  Donalyn Schneeberger E 07/24/2016, 6:36 AM

## 2016-07-24 NOTE — Progress Notes (Signed)
[facility] Individual Statement of Services  Patient Name:  Gregory [name]  Date:  07/24/2016  [facility] the [facility].  Our goal is to provide you with an individualized program based on your diagnosis and situation, designed to meet your specific needs.  With this comprehensive rehabilitation program, you will be expected to participate in at least 3 hours of rehabilitation therapies Monday-Friday, with modified therapy programming on the weekends.  Your rehabilitation program will include the following services:  Physical Therapy (PT), Occupational Therapy (OT), Speech Therapy (ST), 24 hour per day rehabilitation nursing, Neuropsychology, Case Management (Social Worker), Rehabilitation Medicine, Nutrition Services and Pharmacy Services  Weekly team conferences will be held on Wednesdays to discuss your progress.  Your Social Worker will talk with you frequently to get your input and to update you on team discussions.  Team conferences with you and your family in attendance may also be held.  Expected length of stay:  21 to 25 days  Overall anticipated outcome:  Minimal assistance with moderate assistance for stairs  Depending on your progress and recovery, your program may change. Your Social Worker will coordinate services and will keep you informed of any changes. Your Social Worker's name and contact numbers are listed  below.  The following services may also be recommended but are not provided by the [facility]:   Driving Evaluations  Home Health Rehabiltiation Services  Outpatient Rehabilitation Services   Arrangements will be made to provide these services after discharge if needed.  Arrangements include referral to agencies that provide these services.  Your insurance has been verified to be:  [employer] Your primary doctor is:  Dr. Darci NeedleWalter Kohut  Pertinent information will be shared with your  doctor and your insurance company.  Social Worker:  Staci AcostaJenny Nethra Mehlberg, LCSW  [phone] or [phone]) [phone]  Information discussed with and copy given to patient by: Elvera LennoxPrevatt, Shlomie Romig Capps, 07/24/2016, 12:43 PM

## 2016-07-25 ENCOUNTER — Inpatient Hospital Stay (HOSPITAL_COMMUNITY): Payer: Medicare Other | Admitting: Occupational Therapy

## 2016-07-25 ENCOUNTER — Inpatient Hospital Stay (HOSPITAL_COMMUNITY): Payer: Medicare Other | Admitting: Physical Therapy

## 2016-07-25 ENCOUNTER — Inpatient Hospital Stay (HOSPITAL_COMMUNITY): Payer: Medicare Other

## 2016-07-25 ENCOUNTER — Inpatient Hospital Stay (HOSPITAL_COMMUNITY): Payer: Medicare Other | Admitting: Speech Pathology

## 2016-07-25 ENCOUNTER — Encounter (HOSPITAL_COMMUNITY): Payer: Self-pay | Admitting: Interventional Radiology

## 2016-07-25 HISTORY — PX: IR GENERIC HISTORICAL: IMG1180011

## 2016-07-25 LAB — GLUCOSE, CAPILLARY
Glucose-Capillary: 107 mg/dL — ABNORMAL HIGH (ref 65–99)
Glucose-Capillary: 81 mg/dL (ref 65–99)
Glucose-Capillary: 83 mg/dL (ref 65–99)
Glucose-Capillary: 92 mg/dL (ref 65–99)
Glucose-Capillary: 96 mg/dL (ref 65–99)
Glucose-Capillary: 99 mg/dL (ref 65–99)

## 2016-07-25 LAB — SURGICAL PCR SCREEN
MRSA, PCR: POSITIVE — AB
STAPHYLOCOCCUS AUREUS: POSITIVE — AB

## 2016-07-25 LAB — PROTIME-INR
INR: 1.19
PROTHROMBIN TIME: 15.1 s (ref 11.4–15.2)

## 2016-07-25 MED ORDER — LIDOCAINE-EPINEPHRINE (PF) 1 %-1:200000 IJ SOLN
INTRAMUSCULAR | Status: AC | PRN
  Administered 2016-07-25: 10 mL

## 2016-07-25 MED ORDER — SODIUM CHLORIDE 0.45 % IV SOLN
INTRAVENOUS | Status: DC
  Administered 2016-07-25: 18:00:00 via INTRAVENOUS

## 2016-07-25 MED ORDER — FENTANYL CITRATE (PF) 100 MCG/2ML IJ SOLN
INTRAMUSCULAR | Status: AC
  Filled 2016-07-25: qty 2

## 2016-07-25 MED ORDER — IOPAMIDOL (ISOVUE-300) INJECTION 61%
INTRAVENOUS | Status: AC
  Administered 2016-07-25: 10 mL
  Filled 2016-07-25: qty 50

## 2016-07-25 MED ORDER — SODIUM CHLORIDE 0.45 % IV SOLN: INTRAVENOUS | Status: DC

## 2016-07-25 MED ORDER — GLUCAGON HCL RDNA (DIAGNOSTIC) 1 MG IJ SOLR
INTRAMUSCULAR | Status: AC
  Filled 2016-07-25: qty 1

## 2016-07-25 MED ORDER — FENTANYL CITRATE (PF) 100 MCG/2ML IJ SOLN
INTRAMUSCULAR | Status: AC | PRN
  Administered 2016-07-25: 50 ug via INTRAVENOUS

## 2016-07-25 MED ORDER — LIDOCAINE-EPINEPHRINE (PF) 1 %-1:200000 IJ SOLN
INTRAMUSCULAR | Status: AC
  Filled 2016-07-25: qty 30

## 2016-07-25 MED ORDER — SODIUM CHLORIDE 0.45 % IV SOLN
INTRAVENOUS | Status: DC
  Administered 2016-07-25: 1000 mL via INTRAVENOUS

## 2016-07-25 MED ORDER — CEFAZOLIN SODIUM-DEXTROSE 2-4 GM/100ML-% IV SOLN
INTRAVENOUS | Status: AC
  Filled 2016-07-25: qty 100

## 2016-07-25 MED ORDER — MIDAZOLAM HCL 2 MG/2ML IJ SOLN
INTRAMUSCULAR | Status: AC
  Filled 2016-07-25: qty 2

## 2016-07-25 MED ORDER — MIDAZOLAM HCL 2 MG/2ML IJ SOLN
INTRAMUSCULAR | Status: AC | PRN
  Administered 2016-07-25: 1 mg via INTRAVENOUS

## 2016-07-25 NOTE — Progress Notes (Signed)
Subjective/Complaints: Feeding tube is out, pt still has wrist restraints, labs reviewed Has thick white trach secretions T collar is off, O2 sats 100%, pt resting comfortably  ROS: Limited due to cognition  Objective: Vital Signs: Blood pressure (!) 121/95, pulse 61, temperature 98.1 F (36.7 C), temperature source Axillary, resp. rate 18, height '5\' 9"'  (1.753 m), weight 85.5 kg (188 lb 7.9 oz), SpO2 98 %. No results found. Results for orders placed or performed during the hospital encounter of 07/19/16 (from the past 72 hour(s))  Glucose, capillary     Status: None   Collection Time: 07/22/16  7:52 AM  Result Value Ref Range   Glucose-Capillary 98 65 - 99 mg/dL  Glucose, capillary     Status: Abnormal   Collection Time: 07/22/16 11:43 AM  Result Value Ref Range   Glucose-Capillary 124 (H) 65 - 99 mg/dL  Glucose, capillary     Status: Abnormal   Collection Time: 07/22/16  3:36 PM  Result Value Ref Range   Glucose-Capillary 114 (H) 65 - 99 mg/dL  Glucose, capillary     Status: Abnormal   Collection Time: 07/22/16  8:17 PM  Result Value Ref Range   Glucose-Capillary 106 (H) 65 - 99 mg/dL  Glucose, capillary     Status: None   Collection Time: 07/22/16 11:52 PM  Result Value Ref Range   Glucose-Capillary 91 65 - 99 mg/dL  Basic metabolic panel     Status: Abnormal   Collection Time: 07/23/16  4:31 AM  Result Value Ref Range   Sodium 136 135 - 145 mmol/L   Potassium 4.2 3.5 - 5.1 mmol/L   Chloride 99 (L) 101 - 111 mmol/L   CO2 33 (H) 22 - 32 mmol/L   Glucose, Bld 128 (H) 65 - 99 mg/dL   BUN 21 (H) 6 - 20 mg/dL   Creatinine, Ser 0.76 0.61 - 1.24 mg/dL   Calcium 8.9 8.9 - 10.3 mg/dL   GFR calc non Af Amer >60 >60 mL/min   GFR calc Af Amer >60 >60 mL/min    Comment: (NOTE) The eGFR has been calculated using the CKD EPI equation. This calculation has not been validated in all clinical situations. eGFR's persistently <60 mL/min signify possible Chronic Kidney Disease.     Anion gap 4 (L) 5 - 15  Glucose, capillary     Status: Abnormal   Collection Time: 07/23/16  4:32 AM  Result Value Ref Range   Glucose-Capillary 119 (H) 65 - 99 mg/dL  Glucose, capillary     Status: Abnormal   Collection Time: 07/23/16  8:16 AM  Result Value Ref Range   Glucose-Capillary 124 (H) 65 - 99 mg/dL  Glucose, capillary     Status: Abnormal   Collection Time: 07/23/16 12:06 PM  Result Value Ref Range   Glucose-Capillary 101 (H) 65 - 99 mg/dL  Glucose, capillary     Status: Abnormal   Collection Time: 07/23/16  4:04 PM  Result Value Ref Range   Glucose-Capillary 117 (H) 65 - 99 mg/dL  Glucose, capillary     Status: Abnormal   Collection Time: 07/23/16  8:01 PM  Result Value Ref Range   Glucose-Capillary 113 (H) 65 - 99 mg/dL  Glucose, capillary     Status: Abnormal   Collection Time: 07/23/16 11:41 PM  Result Value Ref Range   Glucose-Capillary 119 (H) 65 - 99 mg/dL  Procalcitonin     Status: None   Collection Time: 07/24/16  4:40 AM  Result Value  Ref Range   Procalcitonin <0.10 ng/mL    Comment:        Interpretation: PCT (Procalcitonin) <= 0.5 ng/mL: Systemic infection (sepsis) is not likely. Local bacterial infection is possible. (NOTE)         ICU PCT Algorithm               Non ICU PCT Algorithm    ----------------------------     ------------------------------         PCT < 0.25 ng/mL                 PCT < 0.1 ng/mL     Stopping of antibiotics            Stopping of antibiotics       strongly encouraged.               strongly encouraged.    ----------------------------     ------------------------------       PCT level decrease by               PCT < 0.25 ng/mL       >= 80% from peak PCT       OR PCT 0.25 - 0.5 ng/mL          Stopping of antibiotics                                             encouraged.     Stopping of antibiotics           encouraged.    ----------------------------     ------------------------------       PCT level decrease by               PCT >= 0.25 ng/mL       < 80% from peak PCT        AND PCT >= 0.5 ng/mL            Continuin g antibiotics                                              encouraged.       Continuing antibiotics            encouraged.    ----------------------------     ------------------------------     PCT level increase compared          PCT > 0.5 ng/mL         with peak PCT AND          PCT >= 0.5 ng/mL             Escalation of antibiotics                                          strongly encouraged.      Escalation of antibiotics        strongly encouraged.   Glucose, capillary     Status: Abnormal   Collection Time: 07/24/16  4:46 AM  Result Value Ref Range   Glucose-Capillary 112 (H) 65 - 99 mg/dL  Glucose, capillary     Status: None   Collection  Time: 07/24/16  8:30 AM  Result Value Ref Range   Glucose-Capillary 99 65 - 99 mg/dL  Glucose, capillary     Status: None   Collection Time: 07/24/16 11:47 AM  Result Value Ref Range   Glucose-Capillary 96 65 - 99 mg/dL   Comment 1 Notify RN   Glucose, capillary     Status: Abnormal   Collection Time: 07/24/16  4:22 PM  Result Value Ref Range   Glucose-Capillary 112 (H) 65 - 99 mg/dL  Glucose, capillary     Status: Abnormal   Collection Time: 07/24/16  7:56 PM  Result Value Ref Range   Glucose-Capillary 106 (H) 65 - 99 mg/dL  Glucose, capillary     Status: Abnormal   Collection Time: 07/24/16 11:49 PM  Result Value Ref Range   Glucose-Capillary 126 (H) 65 - 99 mg/dL  Surgical PCR screen     Status: Abnormal   Collection Time: 07/25/16  2:04 AM  Result Value Ref Range   MRSA, PCR POSITIVE (A) NEGATIVE    Comment: RESULT CALLED TO, READ BACK BY AND VERIFIED WITH: S ROACH,RN '@[id number]'  07/25/16 MKELLY,MLT    Staphylococcus aureus POSITIVE (A) NEGATIVE    Comment:        The Xpert SA Assay (FDA approved for NASAL specimens in patients over 55 years of age), is one component of a comprehensive surveillance program.  Test performance  has been validated by [facility] for patients greater than or equal to 73 year old. It is not intended to diagnose infection nor to guide or monitor treatment.   Glucose, capillary     Status: None   Collection Time: 07/25/16  4:10 AM  Result Value Ref Range   Glucose-Capillary 83 65 - 99 mg/dL     HEENT: Normocephalic. Healing trauma Neck: #6 shiley, significant secretions, white thick Cardio: RRR. No JVD.  Resp: Ronchi and Unlabored GI: BS positive and ND Skin:   Other trach site without bleeding Neuro: Somnolent Sensory cannot assess due to somnolence , Motor: Unable to assess this AM due to somnolence  Musc/Skel:  No edema. No tenderness.  Gen NAD, Vital signs reviewed.    Assessment/Plan: 1. Functional deficits secondary to bilateral cortical and subcortical cardioembolic infarcts which require 3+ hours per day of interdisciplinary therapy in a comprehensive inpatient rehab setting. Physiatrist is providing close team supervision and 24 hour management of active medical problems listed below. Physiatrist and rehab team continue to assess barriers to discharge/monitor patient progress toward functional and medical goals. FIM: Function - Bathing Position: Sitting EOB Body parts bathed by patient: Right arm, Left arm, Chest, Abdomen, Right upper leg, Left upper leg, Right lower leg Body parts bathed by helper: Left lower leg, Right lower leg, Buttocks, Front perineal area Assist Level: 2 helpers  Function- Upper Body Dressing/Undressing What is the patient wearing?: Hospital gown Pull over shirt/dress - Perfomed by patient: Thread/unthread right sleeve, Thread/unthread left sleeve Pull over shirt/dress - Perfomed by helper: Put head through opening, Pull shirt over trunk Assist Level: 2 helpers Function - Lower Body Dressing/Undressing What is the patient wearing?: Non-skid slipper socks, Pants Position: Sitting EOB Pants- Performed by helper: Thread/unthread right pants  leg, Pull pants up/down, Thread/unthread left pants leg, Fasten/unfasten pants Non-skid slipper socks- Performed by helper: Don/doff right sock, Don/doff left sock Assist for lower body dressing: 2 Helpers  Function - Toileting Toileting activity did not occur: No continent bowel/bladder event Toileting steps completed by helper: Adjust clothing prior to toileting, Performs  perineal hygiene, Adjust clothing after toileting Toileting Assistive Devices: Grab bar or rail Assist level: Two helpers  Function - Air cabin crew transfer activity did not occur: Safety/medical concerns Toilet transfer assistive device: Elevated toilet seat/BSC over toilet Assist level to toilet: Maximal assist (Pt 25 - 49%/lift and lower) Assist level from toilet: Maximal assist (Pt 25 - 49%/lift and lower) Assist level to bedside commode (at bedside): 2 helpers Assist level from bedside commode (at bedside): 2 helpers  Function - Chair/bed transfer Chair/bed transfer method: Stand pivot Chair/bed transfer assist level: Total assist (Pt < 25%) Chair/bed transfer assistive device: Mechanical lift Mechanical lift: Stedy Chair/bed transfer details: Verbal cues for technique, Tactile cues for posture, Verbal cues for sequencing, Manual facilitation for weight shifting, Manual facilitation for placement  Function - Locomotion: Wheelchair Wheelchair activity did not occur: Safety/medical concerns Wheel 50 feet with 2 turns activity did not occur: Safety/medical concerns Wheel 150 feet activity did not occur: Safety/medical concerns Function - Locomotion: Ambulation Ambulation activity did not occur: Safety/medical concerns Walk 10 feet activity did not occur: Safety/medical concerns Walk 50 feet with 2 turns activity did not occur: Safety/medical concerns Walk 150 feet activity did not occur: Safety/medical concerns Walk 10 feet on uneven surfaces activity did not occur: Safety/medical concerns  Function  - Comprehension Comprehension: Auditory Comprehension assist level: Understands basic less than 25% of the time/ requires cueing >75% of the time  Function - Expression Expression: Verbal Expression assist level: Expresses basis less than 25% of the time/requires cueing >75% of the time.  Function - Social Interaction Social Interaction assist level: Interacts appropriately 25 - 49% of time - Needs frequent redirection.  Function - Problem Solving Problem solving assist level: Solves basic less than 25% of the time - needs direction nearly all the time or does not effectively solve problems and may need a restraint for safety  Function - Memory Memory assist level: Recognizes or recalls less than 25% of the time/requires cueing greater than 75% of the time Patient normally able to recall (first 3 days only): None of the above  Medical Problem List and Plan: 1. 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 Formulized individual treatment plan based on medical history, underlying problem and comorbidities. 2. DVT Prophylaxis/Anticoagulation: Pharmaceutical: Other (comment)--Eliquis d/ced due to increase in bloody secretions, no further streaking, deep suctioning stopped, consider resuming vs IV hep after PEG placed, although pt has had bleeding from foley during last rehab stay while on IV heparin Have ordered IR for PEG, no notes seen thus far 3. Pain Management: tylenol prn pain. Question muscle spasms--will monitor 4. Mood: LCSW to follow for evaluation as mentation improves.  5. Neuropsych: This patient is notcapable of making decisions on hisown behalf. 6. Skin/Wound Care: routine pressure relief measures.  7. Fluids/Electrolytes/Nutrition: Monitor I/O and adjust fluid boluses  as needed. Continue tube feeds. . 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 Tolerated PMV on #6 trach  While still having secretions, sats have remained normal, secretions thick but last dose robinul 3/5 9. Parkinson's disease: Continue Sinemet 37.5/150 qid.  10. Leucocytosis: Resolving. Monitor for signs of infection.  11. ABLA: Continue to monitor--has been fluctuating from 9.6-->10.4, 9.3 on 3/2, stool guaic remains pending   12. Hematuria: Continue to monitor for now. May  need flushes resumed if worsens.  13. Severe dysphagia: NPO till mentation improves.needs PEG,Consult IR Oral care qid. Ice chips with ST.     LOS (Days) 6 A FACE TO FACE EVALUATION WAS PERFORMED  Saysha Menta E 07/25/2016, 7:40 AM

## 2016-07-25 NOTE — Sedation Documentation (Signed)
Patient is resting comfortably. Afib

## 2016-07-25 NOTE — Progress Notes (Signed)
Physical Therapy Note  Patient Details  Name: Gregory [name] MRN: [id number] Date of Birth: 1941-04-16 Today's Date: 07/25/2016  RN placing IV in preparatron for PEG placement.  Pt asked for suctioning after coughing and producting thick sputum; Gregory SnowballAngelina, RN suctioned pt.  Pt lifted R/L feet off of footrests to assist PT in donning socks.   Therapeutic activities with +2 assistance for stand pivot transfer, sitting balance while reaching with R hand to retrieve objects and encourage wt shift R.  Sit>< stand with wt shifting L<>R ; wt bearing to load bil LEs and elicit bil hip and knee extension.  Pt stated he might need to use toilet for BM.  Returned pt to room; PT called for assistance to toilet pt.  Son Gregory [name] became upset that assistance did not come quickly enough.  Gregory SnowballAngelina, RN and this PT assisted pt to toilet. PT left pt on toilet in care of Angelina, Charity fundraiserN.  PT spoke with Gregory AbrahamsMary Ann, RN to assist Angelina getting pt off toilet.   See function navigator for current status.  Gregory [name] 07/25/2016, 2:29 PM

## 2016-07-25 NOTE — Sedation Documentation (Signed)
Patient is resting comfortably. Afib, no arrythmias

## 2016-07-25 NOTE — Procedures (Signed)
Pre procedure Dx: Dysphagia Post Procedure Dx: Same  Successful fluoroscopic guided insertion of gastrostomy tube.   The gastrostomy tube may be used immediately for medications.   Tube feeds may be initiated in 24 hours as per the primary team.    EBL: Minimal  Complications: None immediate  Jay Kristan Brummitt, MD Pager #: [phone]    

## 2016-07-25 NOTE — Plan of Care (Signed)
Problem: RH BLADDER ELIMINATION Goal: RH STG MANAGE BLADDER WITH EQUIPMENT WITH ASSISTANCE STG Manage Bladder With Equipment With mod Assistance  Outcome: Progressing Patient ha indwelling cath

## 2016-07-25 NOTE — Progress Notes (Signed)
1850 07/25/16 nursing Patient daughter Gregory [name] requesting to d/c restraints since patient  has a Scientist, forensictelemon sitter. RN called Elita QuickPam PA and she said not to d/c for now due to trach; f/c and peg;  they will do try out in am. Patients daughter Gregory [name] called back and explained the reason not to take restraints off.

## 2016-07-25 NOTE — Progress Notes (Signed)
07/25/16 nursing Patient has blood tinged urine Pam PA notified.

## 2016-07-25 NOTE — Progress Notes (Signed)
07/25/16 1349 nursing Followed up with interventional radiology re: peg placement per IR they have 3 cases before him. Pam Love awre. New order to start IV fluids continuously.

## 2016-07-25 NOTE — Progress Notes (Signed)
Speech Language Pathology Daily Session Note  Patient Details  Name: Gregory LoronRichard Lees MRN: [id number] Date of Birth: [birth year]  Today's Date: 07/25/2016 SLP Individual Time: 0930-1000 SLP Individual Time Calculation (min): 30 min  Short Term Goals: Week 1: SLP Short Term Goal 1 (Week 1): Patient will consume trials of ice chips without overt s/s of aspiration to assess readiness for repeat objective swallow assessment.  SLP Short Term Goal 2 (Week 1): Patient will increase speech intelligibility to ~100% at the word level with Min A multimodal cues for use of speech intelligibility strategies.  SLP Short Term Goal 3 (Week 1): Pt will follow basic 1 step directions with Mod A verbal cues for 75% accuracy.  SLP Short Term Goal 4 (Week 1): Patient will maintain arousal in regards to keeping his eyes open for ~60 seconds with Mod A verbal cues.  SLP Short Term Goal 5 (Week 1): Pt will tolerate PMSV for ~30 minutes without decline in vitals.  SLP Short Term Goal 6 (Week 1): Pt will complete basic familiar tasks (related to ADLs, brushing hair, oral care) with Mod A verbal cues.   Skilled Therapeutic Interventions: Skilled treatment session focused on speech and cognition goals. Pt with decreased secretions however pt  decreased O2 stats to 85% when SLP entered room and pt required Max sternal rub to increase deep breathing. Pt's stats return to mid 90s and PMSV placed. Pt with wet voice and Total A cues for cough to clear. Pt tolerate valve for ~25 minutes without incidence. SLP facilitated session by providing Total A for maintaining arousal with eyes open ~ 10 seconds. Pt had visitor during session and he opened his eyes more with visitor speaking to him than with SLP. However, pt with continued confusion and spoke of Penelope and no ability to orient to current situation. PMSV removed and pt left upright in bed, restraints in place and bed alarm on. Continue per current plan of care.       Function:  Eating Eating Eating activity did not occur: Safety/medical concerns               Cognition Comprehension Comprehension assist level: Understands basic 25 - 49% of the time/ requires cueing 50 - 75% of the time  Expression   Expression assist level: Expresses basic 25 - 49% of the time/requires cueing 50 - 75% of the time. Uses single words/gestures.  Social Interaction Social Interaction assist level: Interacts appropriately 50 - 74% of the time - May be physically or verbally inappropriate.  Problem Solving Problem solving assist level: Solves basic 25 - 49% of the time - needs direction more than half the time to initiate, plan or complete simple activities  Memory Memory assist level: Recognizes or recalls less than 25% of the time/requires cueing greater than 75% of the time    Pain    Therapy/Group: Individual Therapy   Zionna Homewood B. Dreama Saaverton, M.S., CCC-SLP Speech-Language Pathologist   Brylee Berk 07/25/2016, 4:16 PM

## 2016-07-25 NOTE — Progress Notes (Signed)
07/25/16 1800 nursing  Patient still has copious secretions and RN has to do suctioning- tracheal. Patient tolerated.

## 2016-07-25 NOTE — Progress Notes (Signed)
Physical Therapy Session Note  Patient Details  Name: Gregory [name] MRN: [id number] Date of Birth: 02/25/1941  Today's Date: 07/25/2016 PT Individual Time: 1001-1030 PT Individual Time Calculation (min): 29 min   Short Term Goals: Week 1:  PT Short Term Goal 1 (Week 1): Patient will maintain sitting balance with mod assist  PT Short Term Goal 2 (Week 1): Patient will perform sit<>stand with max assist of 1  PT Short Term Goal 3 (Week 1): Patient will initiate gait training PT Short Term Goal 4 (Week 1): patient will perform standing balance with max assist of 1 for up to 30 seconds.   Skilled Therapeutic Interventions/Progress Updates:   Pt received supine in bed and agreeable to PT. Supine>sit transfer with mod assist from PT using the LUE to pull on therapist and control LE and trunk .   Lateral scoot transfer to WC with mod assist from PT with max cues for improved anterior weight shift to allow clearance of gluteals from bed and reduce sheer forces. PT required to perform peripheral suction prior to exiting room due to increased secretions.   Transported to rehab gym in TIS WC. PT instructed patient in gait x 4 ft with RW and mod/mAax assist to prevent posterior LOB and improved WB through UE on RW. SpO2 assessed following gait: 83% and increased to >90% after 30 second rest.   Patient returned to room and left sitting in WC with call bell in reach and all needs met.         Therapy Documentation Precautions:  Precautions Precautions: Fall Precaution Comments: NG Tube, bil wrist restraints Restrictions Weight Bearing Restrictions: No Vital Signs: Therapy Vitals Pulse Rate: 70 Resp: 18 Patient Position (if appropriate): Lying Oxygen Therapy SpO2: 96 % O2 Device: Tracheostomy Collar O2 Flow Rate (L/min): 6 L/min Pain: 0/10 ; denies.   See Function Navigator for Current Functional Status.   Therapy/Group: Individual Therapy   E  07/25/2016, 11:07 AM  

## 2016-07-25 NOTE — Progress Notes (Signed)
Occupational Therapy Session Note  Patient Details  Name: Gregory [name] MRN: [id number] Date of Birth: [birth year]  Today's Date: 07/25/2016 OT Individual Time: 1102-1205 OT Individual Time Calculation (min): 63 min    Short Term Goals: Week 1:  OT Short Term Goal 1 (Week 1): Pt will maintain sustained attention to selfcare tasks for 20 mins with no more than min instructional cueing for re-direction.  OT Short Term Goal 2 (Week 1): Pt will maintain dynamic sitting EOB or EOC during ADL with no more than mod assist.  OT Short Term Goal 3 (Week 1): Pt will complete UB bathing with supervision sitting unsupported.  OT Short Term Goal 4 (Week 1): Pt will completed LB bathing sit to stand with max assist of one.  OT Short Term Goal 5 (Week 1): Pt will donn pullup pants with max assist using AE sit to stand.   Skilled Therapeutic Interventions/Progress Updates:    Pt up in tilt in space wheelchair to start session.  Therapist removed wrist restraints and positioned pt at the sink for bathing tasks.  His daughter Lawson FiscalLori was present for session as well.  Pt needed max assist for scooting forward to the edge of the chair with decreased active trunk flexion or lateral weightshifting noted.  He tends to use extensor patterns in his trunk with reliance on UEs as well for all transitional movements.  Max questioning cueing for sequencing washing of body parts.  Mod assist for sit to stand transitions X 3 for washing peri area and pulling brief over his hips.  Decreased forward trunk flexion noted with all sit to stand.  He still exhibits increased pushing to the left in standing, but was able to maintain standing balance with min assist, with at least one UE supported on the sink.  Attempted to use mirror for postural feedback so pt could selfcorrect balance, but he still needed mod assist to return to midline.  Worked on crossing LEs over the opposite knee for washing feet and donning gripper socks.  He still  needs mod assist to cross them, but once up he was able to donn the left sock with supervision but need mod assist for the right.  Max assist needed for washing peri area in standing for thoroughness.  He was able to maintain selective attention to bathing task for greater than 30 mins in minimal distracting environment during this session.  Once finished he was left up in wheelchair with wrsit restraints in place and daughter present in room as well.  Nursing made aware of red pressure site on his right buttocks and dressing applied.  Oxygen sats remained greater than 96% on 6Ls trach collar during session.    Therapy Documentation Precautions:  Precautions Precautions: Fall Precaution Comments: B wrist restraints Restrictions Weight Bearing Restrictions: No   Vital Signs: Therapy Vitals Pulse Rate: 79 Resp: 18 Patient Position (if appropriate): Sitting Oxygen Therapy SpO2: 100 % O2 Device: Tracheostomy Collar O2 Flow Rate (L/min): 6 L/min FiO2 (%): 21 % Pulse Oximetry Type: Continuous Pain: Pain Assessment Pain Assessment: No/denies pain ADL: See Function Navigator for Current Functional Status.   Therapy/Group: Individual Therapy  Kojo Liby OTR/L 07/25/2016, 12:38 PM

## 2016-07-25 NOTE — Progress Notes (Signed)
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

## 2016-07-26 ENCOUNTER — Inpatient Hospital Stay (HOSPITAL_COMMUNITY): Payer: Medicare Other | Admitting: Physical Therapy

## 2016-07-26 ENCOUNTER — Inpatient Hospital Stay (HOSPITAL_COMMUNITY): Payer: Medicare Other | Admitting: Occupational Therapy

## 2016-07-26 ENCOUNTER — Inpatient Hospital Stay (HOSPITAL_COMMUNITY): Payer: Medicare Other | Admitting: Speech Pathology

## 2016-07-26 LAB — CBC
HEMATOCRIT: 32 % — AB (ref 39.0–52.0)
HEMOGLOBIN: 10.2 g/dL — AB (ref 13.0–17.0)
MCH: 28.1 pg (ref 26.0–34.0)
MCHC: 31.9 g/dL (ref 30.0–36.0)
MCV: 88.2 fL (ref 78.0–100.0)
Platelets: 315 10*3/uL (ref 150–400)
RBC: 3.63 MIL/uL — AB (ref 4.22–5.81)
RDW: 17.3 % — ABNORMAL HIGH (ref 11.5–15.5)
WBC: 8 10*3/uL (ref 4.0–10.5)

## 2016-07-26 LAB — GLUCOSE, CAPILLARY
GLUCOSE-CAPILLARY: 86 mg/dL (ref 65–99)
Glucose-Capillary: 90 mg/dL (ref 65–99)
Glucose-Capillary: 85 mg/dL (ref 65–99)
Glucose-Capillary: 70 mg/dL (ref 65–99)
Glucose-Capillary: 82 mg/dL (ref 65–99)

## 2016-07-26 MED ORDER — INSULIN ASPART 100 UNIT/ML ~~LOC~~ SOLN: 0.0000 [IU] | Freq: Three times a day (TID) | SUBCUTANEOUS | Status: DC

## 2016-07-26 MED ORDER — FREE WATER
100.0000 mL | Freq: Three times a day (TID) | Status: DC
  Administered 2016-07-26 (×2): 100 mL

## 2016-07-26 MED ORDER — JEVITY 1.5 CAL/FIBER PO LIQD
300.0000 mL | Freq: Four times a day (QID) | ORAL | Status: DC
  Administered 2016-07-26: 50 mL
  Administered 2016-07-27: 150 mL
  Administered 2016-07-27 (×2): 300 mL
  Administered 2016-07-27: 250 mL
  Administered 2016-07-28: 23:00:00
  Administered 2016-07-28 – 2016-08-14 (×71): 300 mL
  Filled 2016-07-26 (×90): qty 1000

## 2016-07-26 MED ORDER — CHLORHEXIDINE GLUCONATE CLOTH 2 % EX PADS
6.0000 | MEDICATED_PAD | Freq: Every day | CUTANEOUS | Status: AC
  Administered 2016-07-27 – 2016-07-31 (×5): 6 via TOPICAL

## 2016-07-26 MED ORDER — INSULIN ASPART 100 UNIT/ML ~~LOC~~ SOLN: 0.0000 [IU] | Freq: Four times a day (QID) | SUBCUTANEOUS | Status: DC

## 2016-07-26 MED ORDER — SODIUM CHLORIDE 0.45 % IV SOLN: INTRAVENOUS | Status: AC

## 2016-07-26 MED ORDER — MUPIROCIN 2 % EX OINT
1.0000 "application " | TOPICAL_OINTMENT | Freq: Two times a day (BID) | CUTANEOUS | Status: AC
  Administered 2016-07-27 – 2016-07-31 (×10): 1 via NASAL
  Filled 2016-07-26 (×2): qty 22
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
## 2016-07-26 NOTE — Progress Notes (Signed)
Patient ID: Gregory [name], male   DOB: [birth year], 76 y.o.   MRN: [id number]    Referring Physician(s): Dr. Loletta Parish  Supervising Physician: [name]  Patient Status: [facility] - In-pt  Chief Complaint: dysphagia  Subjective: Patient with no new complaints today.  No abdominal pain.  Allergies: No known allergies  Medications: Prior to Admission medications   Medication Sig Start Date End Date Taking? Authorizing Provider  apixaban (ELIQUIS) 5 MG TABS tablet Take 1 tablet (5 mg total) by mouth 2 (two) times daily. 07/19/16   Kathlen Mody, MD  aspirin 81 MG chewable tablet Place 1 tablet (81 mg total) into feeding tube daily. 07/20/16   Kathlen Mody, MD  atorvastatin (LIPITOR) 40 MG tablet Take 40 mg by mouth daily.    Historical Provider, MD  carbidopa-levodopa (SINEMET IR) 25-100 MG tablet Place 1.5 tablets into feeding tube 4 (four) times daily. 07/19/16   Kathlen Mody, MD  Cyanocobalamin (B-12) 2500 MCG TABS Take by mouth.    Historical Provider, MD  furosemide (LASIX) 40 MG tablet Place 1 tablet (40 mg total) into feeding tube daily. 07/20/16   Kathlen Mody, MD  Nutritional Supplements (FEEDING SUPPLEMENT, VITAL AF 1.2 CAL,) LIQD Place 1,000 mLs into feeding tube continuous. 07/19/16   Kathlen Mody, MD  pantoprazole sodium (PROTONIX) 40 mg/20 mL PACK Place 20 mLs (40 mg total) into feeding tube daily. 07/20/16   Kathlen Mody, MD  polyethylene glycol (MIRALAX / GLYCOLAX) packet Take 17 g by mouth daily as needed for mild constipation. 07/19/16   Kathlen Mody, MD  sennosides (SENOKOT) 8.8 MG/5ML syrup Place 5 mLs into feeding tube at bedtime as needed for mild constipation. 07/19/16   Kathlen Mody, MD  tamsulosin (FLOMAX) 0.4 MG CAPS capsule  10/08/15   Historical Provider, MD    Vital Signs: BP 106/78 (BP Location: Left Arm)   Pulse 66   Temp 98 F (36.7 C) (Oral)   Resp 18   Ht 5\' 9"  (1.753 m)   Wt 194 lb 0.1 oz (88 kg)   SpO2 100%   BMI 28.65 kg/m   Physical Exam: Abd:  soft, g-tube in good position with no erythema or bleeding.  Imaging: Ir Gastrostomy Tube Mod Sed  Result Date: 07/25/2016 INDICATION: Dysphagia. In please place percutaneous gastrostomy tube for enteric nutrition supplementation. EXAM: PULL TROUGH GASTROSTOMY TUBE PLACEMENT COMPARISON:  None. MEDICATIONS: Ancef 2 gm IV; Antibiotics were administered within 1 hour of the procedure. Glucagon 1 mg IV CONTRAST:  20 mL of Isovue 300 administered into the gastric lumen. ANESTHESIA/SEDATION: Moderate (conscious) sedation was employed during this procedure. A total of Versed 1 mg and Fentanyl 50 mcg was administered intravenously. Moderate Sedation Time: 10 minutes. The patient's level of consciousness and vital signs were monitored continuously by radiology nursing throughout the procedure under my direct supervision. FLUOROSCOPY TIME:  2 minutes 54 seconds (20 mGy) COMPLICATIONS: None immediate. PROCEDURE: Informed written consent was obtained from the patient's family following explanation of the procedure, risks, benefits and alternatives. A time out was performed prior to the initiation of the procedure. Ultrasound scanning was performed to demarcate the edge of the left lobe of the liver. Maximal barrier sterile technique utilized including caps, mask, sterile gowns, sterile gloves, large sterile drape, hand hygiene and Betadine prep. The left upper quadrant was sterilely prepped and draped. An oral gastric catheter was inserted into the stomach under fluoroscopy. The existing nasogastric feeding tube was removed. The left costal margin and air opacified transverse colon were  identified and avoided. Air was injected into the stomach for insufflation and visualization under fluoroscopy. Under sterile conditions a 17 gauge trocar needle was utilized to access the stomach percutaneously beneath the left subcostal margin after the overlying soft tissues were anesthetized with 1% Lidocaine with epinephrine. Needle  position was confirmed within the stomach with aspiration of air and injection of small amount of contrast. A single T tack was deployed for gastropexy. Over an Amplatz guide wire, a 9-French sheath was inserted into the stomach. A snare device was utilized to capture the oral gastric catheter. The snare device was pulled retrograde from the stomach up the esophagus and out the oropharynx. The 20-French pull-through gastrostomy was connected to the snare device and pulled antegrade through the oropharynx down the esophagus into the stomach and then through the percutaneous tract external to the patient. The gastrostomy was assembled externally. Contrast injection confirms position in the stomach. Several spot radiographic images were obtained in various obliquities for documentation. The patient tolerated procedure well without immediate post procedural complication. FINDINGS: After successful fluoroscopic guided placement, the gastrostomy tube is appropriately positioned with internal disc against the ventral aspect of the gastric lumen. IMPRESSION: Successful fluoroscopic insertion of a 20-French pull-through gastrostomy tube. The gastrostomy may be used immediately for medication administration and in 24 hrs for the initiation of feeds. Electronically Signed   By: Simonne ComeJohn  Watts M.D.   On: 07/25/2016 17:37    Labs:  CBC:  Recent Labs  07/17/16 0443 07/17/16 1614 07/19/16 0429 07/20/16 0443  WBC 15.4* 15.7* 10.9* 8.7  HGB 10.4* 10.4* 9.6* 9.3*  HCT 34.0* 33.7* 32.2* 31.1*  PLT 432* 434* 366 340    COAGS:  Recent Labs  06/01/16 2127 06/02/16 0237 06/29/16 1308 07/25/16 1547  INR 1.19 1.26  --  1.19  APTT  --  31 96*  --     BMP:  Recent Labs  07/18/16 0427 07/19/16 0429 07/20/16 0443 07/23/16 0431  NA 141 141 139 136  K 4.4 4.0 3.9 4.2  CL 104 106 104 99*  CO2 29 30 30  33*  GLUCOSE 128* 108* 108* 128*  BUN 36* 31* 27* 21*  CALCIUM 9.2 9.0 9.1 8.9  CREATININE 0.86 0.82 0.76  0.76  GFRNONAA >60 >60 >60 >60  GFRAA >60 >60 >60 >60    LIVER FUNCTION TESTS:  Recent Labs  06/04/16 0338 06/19/16 0238  06/21/16 0507  07/17/16 0443 07/18/16 0427 07/19/16 0429 07/20/16 0443  BILITOT 1.5* 1.3*  --  0.9  --   --   --   --  0.4  AST 48* 22  --  23  --   --   --   --  16  ALT 7* 8*  --  13*  --   --   --   --  23  ALKPHOS 52 145*  --  128*  --   --   --   --  102  PROT 5.9* 7.2  --  6.6  --   --   --   --  6.6  ALBUMIN 2.9* 3.0*  < > 2.9*  < > 2.8* 2.6* 2.7* 2.4*  < > = values in this interval not displayed.  Assessment and Plan: 1. Dysphagia, s/p g-tube placement -patient doing well. -g-tube may be used later today, 24hrs after placement -continue routine irrigations and flushes. -call with questions  Electronically Signed: Taysean Wager E 07/26/2016, 9:27 AM   I spent a total of 15 Minutes at  the the patient's bedside AND on the patient's hospital floor or unit, greater than 50% of which was counseling/coordinating care for dysphagia

## 2016-07-26 NOTE — Progress Notes (Signed)
Physical Therapy Session Note  Patient Details  Name: Gregory [name] MRN: [id number] Date of Birth: Oct 01, 1940  Today's Date: 07/26/2016 PT Individual Time: 1100-1205 AND 1415-1515 PT Individual Time Calculation (min): 65 min AND 60 min   Short Term Goals: Week 1:  PT Short Term Goal 1 (Week 1): Patient will maintain sitting balance with mod assist  PT Short Term Goal 2 (Week 1): Patient will perform sit<>stand with max assist of 1  PT Short Term Goal 3 (Week 1): Patient will initiate gait training PT Short Term Goal 4 (Week 1): patient will perform standing balance with max assist of 1 for up to 30 seconds.   Skilled Therapeutic Interventions/Progress Updates:   Pt received sitting in WC and agreeable to PT  PT transported pt to Rehab gym in [place] with total assist. Squat pivot transfer training completed to L and R, x2 each with mod assist and max cues for sequencing, weight shifting, and proper UE placement.   Sitting balance 2 bouts x 5 minutes easch with supervision assist from PT while reaching of objects anteriorly.   Standing balance x 4 minutes with 2 UE support progressing to no UE support up to 20 seconds. Min assist from PT throughout with only mild posterior LOB. Patient able in engage ankle and hip strategy to improve anterior weight shift.   Gait x 12 ft with RW and mod assist from PT. Max cues from PT for increased step length, improved anterior weight shift, and improved breathing while ambulating.   Bed mobility with mod assist From PT and supine NMR for rolling R and L as well as Hip adduction against gravity x 12 BLE.   Throughout treatment PT assessed Vitals with SpO2 >92% following all activity and HR <88bpm. Pt maintained on 6 L/min O2 throughout treatment.   Patient returned to room and left sitting in [facility] with call bell in reach and all needs met.    Session 2:   Pt received sitting in WC and agreeable to PT  Transported to rehab gym with total assist in  TIS WC. PT instructed patient in stand pivot transfer x 4 for transfer to nustep and to car. Mod assist from PT progressing to max assist at end of session due to decreased endurance and increased posterior LOB. Max cues for AD management and step pattern to prevent LOB and minimize fall risk.   Nustep  Level 1 endurance and reciprocal movement pattern training x 8 minutes with rest breaks every 2 minutes to assess vitals. SpO2 87% at first assessment and >95% at all other assessments. Min cues for improved breathing, ROM, and proper speed to reduce fatigue. Pt reports Borg RPE 14/20 at end of 8 minutes.   Pt returned to room and performed stand pivot  transfer to bed with mod assist. Sit>supine completed with max assist and left supine in bed with call bell in reach and all needs met. Pt maintained on 6 L/min O2 throughout          Therapy Documentation Precautions:  Precautions Precautions: Fall Precaution Comments: B wrist restraints Restrictions Weight Bearing Restrictions: No Vital Signs: Therapy Vitals Pulse Rate: 66 Resp: 18 Patient Position (if appropriate): Lying Oxygen Therapy SpO2: 100 % O2 Device: Tracheostomy Collar O2 Flow Rate (L/min): 6 L/min FiO2 (%): 28 % Pain: Pain Assessment Pain Assessment: No/denies pain   See Function Navigator for Current Functional Status.   Therapy/Group: Individual Therapy  [name] 07/26/2016, 11:45 AM

## 2016-07-26 NOTE — Plan of Care (Signed)
Problem: RH BOWEL ELIMINATION Goal: RH STG MANAGE BOWEL W/MEDICATION W/ASSISTANCE STG Manage Bowel with Medication with mod Assistance.   Outcome: Progressing Pt will be able to verbalize need to be changed and need to be assisted to bedpan.

## 2016-07-26 NOTE — Progress Notes (Signed)
Subjective/Complaints: Patientiented to [place]. Still with severe dysarthria. , occasional weak cough. Copious secretions continue. Oxygen saturations remain good. Spoke with daughter. Updated her on medical issues. Discussed complexity of case. Discussed rehabilitation goals of moderate assistance 3 week length of stay. She would like a 4 week length of stay if possible.   ROS: Limited due to cognition  Objective: Vital Signs: Blood pressure 106/78, pulse 78, temperature 98 F (36.7 C), temperature source Oral, resp. rate 16, height 5\' 9"  (1.753 m), weight 88 kg (194 lb 0.1 oz), SpO2 99 %. Ir Gastrostomy Tube Mod Sed  Result Date: 07/25/2016 INDICATION: Dysphagia. In please place percutaneous gastrostomy tube for enteric nutrition supplementation. EXAM: PULL TROUGH GASTROSTOMY TUBE PLACEMENT COMPARISON:  None. MEDICATIONS: Ancef 2 gm IV; Antibiotics were administered within 1 hour of the procedure. Glucagon 1 mg IV CONTRAST:  20 mL of Isovue 300 administered into the gastric lumen. ANESTHESIA/SEDATION: Moderate (conscious) sedation was employed during this procedure. A total of Versed 1 mg and Fentanyl 50 mcg was administered intravenously. Moderate Sedation Time: 10 minutes. The patient's level of consciousness and vital signs were monitored continuously by radiology nursing throughout the procedure under my direct supervision. FLUOROSCOPY TIME:  2 minutes 54 seconds (20 mGy) COMPLICATIONS: None immediate. PROCEDURE: Informed written consent was obtained from the patient's family following explanation of the procedure, risks, benefits and alternatives. A time out was performed prior to the initiation of the procedure. Ultrasound scanning was performed to demarcate the edge of the left lobe of the liver. Maximal barrier sterile technique utilized including caps, mask, sterile gowns, sterile gloves, large sterile drape, hand hygiene and Betadine prep. The left upper quadrant was sterilely prepped  and draped. An oral gastric catheter was inserted into the stomach under fluoroscopy. The existing nasogastric feeding tube was removed. The left costal margin and air opacified transverse colon were identified and avoided. Air was injected into the stomach for insufflation and visualization under fluoroscopy. Under sterile conditions a 17 gauge trocar needle was utilized to access the stomach percutaneously beneath the left subcostal margin after the overlying soft tissues were anesthetized with 1% Lidocaine with epinephrine. Needle position was confirmed within the stomach with aspiration of air and injection of small amount of contrast. A single T tack was deployed for gastropexy. Over an Amplatz guide wire, a 9-French sheath was inserted into the stomach. A snare device was utilized to capture the oral gastric catheter. The snare device was pulled retrograde from the stomach up the esophagus and out the oropharynx. The 20-French pull-through gastrostomy was connected to the snare device and pulled antegrade through the oropharynx down the esophagus into the stomach and then through the percutaneous tract external to the patient. The gastrostomy was assembled externally. Contrast injection confirms position in the stomach. Several spot radiographic images were obtained in various obliquities for documentation. The patient tolerated procedure well without immediate post procedural complication. FINDINGS: After successful fluoroscopic guided placement, the gastrostomy tube is appropriately positioned with internal disc against the ventral aspect of the gastric lumen. IMPRESSION: Successful fluoroscopic insertion of a 20-French pull-through gastrostomy tube. The gastrostomy may be used immediately for medication administration and in 24 hrs for the initiation of feeds. Electronically Signed   By: [name] M.D.   On: 07/25/2016 17:37   Results for orders placed or performed during the hospital encounter of  07/19/16 (from the past 72 hour(s))  Glucose, capillary     Status: Abnormal   Collection Time: 07/23/16 12:06 PM  Result  Value Ref Range   Glucose-Capillary 101 (H) 65 - 99 mg/dL  Glucose, capillary     Status: Abnormal   Collection Time: 07/23/16  4:04 PM  Result Value Ref Range   Glucose-Capillary 117 (H) 65 - 99 mg/dL  Glucose, capillary     Status: Abnormal   Collection Time: 07/23/16  8:01 PM  Result Value Ref Range   Glucose-Capillary 113 (H) 65 - 99 mg/dL  Glucose, capillary     Status: Abnormal   Collection Time: 07/23/16 11:41 PM  Result Value Ref Range   Glucose-Capillary 119 (H) 65 - 99 mg/dL  Procalcitonin     Status: None   Collection Time: 07/24/16  4:40 AM  Result Value Ref Range   Procalcitonin <0.10 ng/mL    Comment:        Interpretation: PCT (Procalcitonin) <= 0.5 ng/mL: Systemic infection (sepsis) is not likely. Local bacterial infection is possible. (NOTE)         ICU PCT Algorithm               Non ICU PCT Algorithm    ----------------------------     ------------------------------         PCT < 0.25 ng/mL                 PCT < 0.1 ng/mL     Stopping of antibiotics            Stopping of antibiotics       strongly encouraged.               strongly encouraged.    ----------------------------     ------------------------------       PCT level decrease by               PCT < 0.25 ng/mL       >= 80% from peak PCT       OR PCT 0.25 - 0.5 ng/mL          Stopping of antibiotics                                             encouraged.     Stopping of antibiotics           encouraged.    ----------------------------     ------------------------------       PCT level decrease by              PCT >= 0.25 ng/mL       < 80% from peak PCT        AND PCT >= 0.5 ng/mL            Continuin g antibiotics                                              encouraged.       Continuing antibiotics            encouraged.    ----------------------------      ------------------------------     PCT level increase compared          PCT > 0.5 ng/mL         with peak PCT AND  PCT >= 0.5 ng/mL             Escalation of antibiotics                                          strongly encouraged.      Escalation of antibiotics        strongly encouraged.   Glucose, capillary     Status: Abnormal   Collection Time: 07/24/16  4:46 AM  Result Value Ref Range   Glucose-Capillary 112 (H) 65 - 99 mg/dL  Glucose, capillary     Status: None   Collection Time: 07/24/16  8:30 AM  Result Value Ref Range   Glucose-Capillary 99 65 - 99 mg/dL  Glucose, capillary     Status: None   Collection Time: 07/24/16 11:47 AM  Result Value Ref Range   Glucose-Capillary 96 65 - 99 mg/dL   Comment 1 Notify RN   Glucose, capillary     Status: Abnormal   Collection Time: 07/24/16  4:22 PM  Result Value Ref Range   Glucose-Capillary 112 (H) 65 - 99 mg/dL  Glucose, capillary     Status: Abnormal   Collection Time: 07/24/16  7:56 PM  Result Value Ref Range   Glucose-Capillary 106 (H) 65 - 99 mg/dL  Glucose, capillary     Status: Abnormal   Collection Time: 07/24/16 11:49 PM  Result Value Ref Range   Glucose-Capillary 126 (H) 65 - 99 mg/dL  Surgical PCR screen     Status: Abnormal   Collection Time: 07/25/16  2:04 AM  Result Value Ref Range   MRSA, PCR POSITIVE (A) NEGATIVE    Comment: RESULT CALLED TO, READ BACK BY AND VERIFIED WITH: S ROACH,RN @0506  07/25/16 MKELLY,MLT    Staphylococcus aureus POSITIVE (A) NEGATIVE    Comment:        The Xpert SA Assay (FDA approved for NASAL specimens in patients over 77 years of age), is one component of a comprehensive surveillance program.  Test performance has been validated by [facility] for patients greater than or equal to 77 year old. It is not intended to diagnose infection nor to guide or monitor treatment.   Glucose, capillary     Status: None   Collection Time: 07/25/16  4:10 AM  Result Value Ref  Range   Glucose-Capillary 83 65 - 99 mg/dL  Glucose, capillary     Status: None   Collection Time: 07/25/16  8:31 AM  Result Value Ref Range   Glucose-Capillary 92 65 - 99 mg/dL  Glucose, capillary     Status: None   Collection Time: 07/25/16 12:45 PM  Result Value Ref Range   Glucose-Capillary 96 65 - 99 mg/dL  Protime-INR     Status: None   Collection Time: 07/25/16  3:47 PM  Result Value Ref Range   Prothrombin Time 15.1 11.4 - 15.2 seconds   INR 1.19   Glucose, capillary     Status: Abnormal   Collection Time: 07/25/16  6:25 PM  Result Value Ref Range   Glucose-Capillary 107 (H) 65 - 99 mg/dL  Glucose, capillary     Status: None   Collection Time: 07/25/16  8:30 PM  Result Value Ref Range   Glucose-Capillary 81 65 - 99 mg/dL  Glucose, capillary     Status: None   Collection Time: 07/25/16 11:53 PM  Result Value Ref Range  Glucose-Capillary 99 65 - 99 mg/dL  Glucose, capillary     Status: None   Collection Time: 07/26/16  3:37 AM  Result Value Ref Range   Glucose-Capillary 90 65 - 99 mg/dL  Glucose, capillary     Status: None   Collection Time: 07/26/16  8:11 AM  Result Value Ref Range   Glucose-Capillary 85 65 - 99 mg/dL     HEENT: Normocephalic. Healing trauma Neck: #6 shiley, significant secretions, white thick Cardio: RRR. No JVD.  Resp: Ronchi and Unlabored GI: BS positive and ND, PEG site clean, dry and intact. Nontender to palpation. No drainage  Skin:   Other trach site without bleeding Neuro: Somnolent Sensory cannot assess due to somnolence , Motor: Unable to assess this AM due to somnolence  Musc/Skel:  No edema. No tenderness.  Gen NAD, Vital signs reviewed.    Assessment/Plan: 1. Functional deficits secondary to bilateral cortical and subcortical cardioembolic infarcts which require 3+ hours per day of interdisciplinary therapy in a comprehensive inpatient rehab setting. Physiatrist is providing close team supervision and 24 hour management of active  medical problems listed below. Physiatrist and rehab team continue to assess barriers to discharge/monitor patient progress toward functional and medical goals. FIM: Function - Bathing Position: Wheelchair/chair at sink Body parts bathed by patient: Right arm, Left arm, Chest, Abdomen, Right upper leg, Left upper leg Body parts bathed by helper: Right lower leg, Left lower leg, Buttocks, Front perineal area, Back Assist Level: 2 helpers  Function- Upper Body Dressing/Undressing What is the patient wearing?: Hospital gown Pull over shirt/dress - Perfomed by patient: Thread/unthread right sleeve, Thread/unthread left sleeve Pull over shirt/dress - Perfomed by helper: Put head through opening, Pull shirt over trunk Assist Level: 2 helpers Function - Lower Body Dressing/Undressing What is the patient wearing?: Non-skid slipper socks Position: Wheelchair/chair at sink Pants- Performed by helper: Thread/unthread right pants leg, Pull pants up/down, Thread/unthread left pants leg, Fasten/unfasten pants Non-skid slipper socks- Performed by helper: Don/doff right sock, Don/doff left sock Socks - Performed by helper: Don/doff right sock, Don/doff left sock Assist for lower body dressing: 2 Helpers  Function - Toileting Toileting activity did not occur: No continent bowel/bladder event Toileting steps completed by helper: Adjust clothing prior to toileting, Performs perineal hygiene, Adjust clothing after toileting Toileting Assistive Devices: Grab bar or rail Assist level: Two helpers  Function - Archivist transfer activity did not occur: Safety/medical concerns Toilet transfer assistive device: Elevated toilet seat/BSC over toilet Assist level to toilet: 2 helpers Assist level from toilet: Maximal assist (Pt 25 - 49%/lift and lower) Assist level to bedside commode (at bedside): 2 helpers Assist level from bedside commode (at bedside): 2 helpers  Function - Chair/bed  transfer Chair/bed transfer method: Stand pivot Chair/bed transfer assist level: 2 helpers Chair/bed transfer assistive device: Mechanical lift Mechanical lift: Stedy Chair/bed transfer details: Verbal cues for technique, Tactile cues for posture, Verbal cues for sequencing, Manual facilitation for weight shifting, Manual facilitation for placement  Function - Locomotion: Wheelchair Wheelchair activity did not occur: Safety/medical concerns Wheel 50 feet with 2 turns activity did not occur: Safety/medical concerns Wheel 150 feet activity did not occur: Safety/medical concerns Function - Locomotion: Ambulation Ambulation activity did not occur: Safety/medical concerns Walk 10 feet activity did not occur: Safety/medical concerns Walk 50 feet with 2 turns activity did not occur: Safety/medical concerns Walk 150 feet activity did not occur: Safety/medical concerns Walk 10 feet on uneven surfaces activity did not occur: Safety/medical concerns  Function -  Comprehension Comprehension: Auditory Comprehension assist level: Understands basic 25 - 49% of the time/ requires cueing 50 - 75% of the time  Function - Expression Expression: Verbal Expression assistive device: Talk trach valve Expression assist level: Expresses basic 25 - 49% of the time/requires cueing 50 - 75% of the time. Uses single words/gestures.  Function - Social Interaction Social Interaction assist level: Interacts appropriately 25 - 49% of time - Needs frequent redirection.  Function - Problem Solving Problem solving assist level: Solves basic less than 25% of the time - needs direction nearly all the time or does not effectively solve problems and may need a restraint for safety  Function - Memory Memory assist level: Recognizes or recalls 25 - 49% of the time/requires cueing 50 - 75% of the time Patient normally able to recall (first 3 days only): None of the above  Medical Problem List and Plan: 1. Cognitive  deficits, severe dysphagia and gait disorder secondary to bilateral cardioembolic infarcts   as discussed with daughter, anticoagulation with liquids or heparin is to help prevent cardioembolic strokes. The aspirin should help pevent thrombotic strokes from carotid artery disease Still having small amount of hematuria.  2. DVT Prophylaxis/Anticoagulation: Pharmaceutical: Other (comment)--Eliquis d/ced due to increase in bloody secretions, no further streaking, deep suctioning stopped, consider resuming vs IV hep after PEG placed, although pt has had bleeding from foley during last rehab stay while on IV heparin Have ordered IR for PEG, no notes seen thus far 3. Pain Management: tylenol prn pain. Question muscle spasms--will monitor 4. Mood: LCSW to follow for evaluation as mentation improves.  5. Neuropsych: This patient is notcapable of making decisions on hisown behalf. 6. Skin/Wound Care: routine pressure relief measures.  7. Fluids/Electrolytes/Nutrition: Monitor I/O and adjust fluid boluses as needed. Continue tube feeds. . 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 Tolerated PMV on #6 trach  While still having secretions, sats have remained normal, secretions thick but last dose robinul 3/5 , discussed with daughter, the pros and cons of adding an anticholinergic agent such as scopolamine patch 9. Parkinson's disease: Continue Sinemet 37.5/150 qid.  10. Leucocytosis: Resolving. Monitor for signs of infection.  11. ABLA: Continue to monitor--has been fluctuating from 9.6-->10.4, 9.3 on 3/2, stool guaic remains pending, repeat today    12. Hematuria: Continue to monitor for now. May need flushes resumed if worsens.  13. Severe dysphagia: NPO till mentation improv   start PEG feedings   LOS (Days) 7 A FACE TO FACE EVALUATION WAS PERFORMED  [name],ANDREW E 07/26/2016, 8:54 AM

## 2016-07-26 NOTE — Progress Notes (Signed)
Speech Language Pathology Daily Session Note  Patient Details  Name: Gregory [name] MRN: [id number] Date of Birth: [birth year]  Today's Date: 07/26/2016 SLP Individual Time: 1300-1330 SLP Individual Time Calculation (min): 30 min  Short Term Goals: Week 1: SLP Short Term Goal 1 (Week 1): Patient will consume trials of ice chips without overt s/s of aspiration to assess readiness for repeat objective swallow assessment.  SLP Short Term Goal 2 (Week 1): Patient will increase speech intelligibility to ~100% at the word level with Min A multimodal cues for use of speech intelligibility strategies.  SLP Short Term Goal 3 (Week 1): Pt will follow basic 1 step directions with Mod A verbal cues for 75% accuracy.  SLP Short Term Goal 4 (Week 1): Patient will maintain arousal in regards to keeping his eyes open for ~60 seconds with Mod A verbal cues.  SLP Short Term Goal 5 (Week 1): Pt will tolerate PMSV for ~30 minutes without decline in vitals.  SLP Short Term Goal 6 (Week 1): Pt will complete basic familiar tasks (related to ADLs, brushing hair, oral care) with Mod A verbal cues.   Skilled Therapeutic Interventions: Skilled treatment session focused on speech and dysphagia goals. SLP facilitated session by placing PMSV and providing Max A verbal cues for improving strong cough to effectively clear secretions through oral cavity. Pt largely ineffective with producing strong cough and he is unaware of of periodic wet voice. Daughter Gregory Foster(Brittany) present and education provided on recommendation to allow pt to wear PMSV during all therapies with full supervision. Education also provided on increasing pt's awareness of wetness. Pt tolerate valve placement without decline in respiratory status. Pt with speech intelligibility at the simple phrase level ~75% with Min A cues. Skilled observation of pt consuming ice chips after oral care. Pt with immediate cough and mild difficulty clearing wetness. Required Max A for  attempts at wet cough. Pt with the appearance of delayed swallow. Pt left upright in tilt in space wheelchair, daughter present, PMSV removed and all needs within reach. Pt stated that he needed to use bathroom and nursing notified. All questions answered to daughter satisfaction at this time. Continue per current plan of care.      Function:  Eating Eating   Modified Consistency Diet: Yes (Trials of ice chips with SLP only) Eating Assist Level: Helper feeds patient           Cognition Comprehension Comprehension assist level: Understands basic 25 - 49% of the time/ requires cueing 50 - 75% of the time  Expression Expression assistive device: Talk trach valve Expression assist level: Expresses basic 25 - 49% of the time/requires cueing 50 - 75% of the time. Uses single words/gestures.  Social Interaction Social Interaction assist level: Interacts appropriately 25 - 49% of time - Needs frequent redirection.  Problem Solving Problem solving assist level: Solves basic 25 - 49% of the time - needs direction more than half the time to initiate, plan or complete simple activities  Memory Memory assist level: Recognizes or recalls less than 25% of the time/requires cueing greater than 75% of the time    Pain    Therapy/Group: Individual Therapy   Klyn Kroening B. Dreama Saaverton, M.S., CCC-SLP Speech-Language Pathologist   Evalisse Prajapati 07/26/2016, 4:24 PM

## 2016-07-26 NOTE — Plan of Care (Signed)
Problem: RH SKIN INTEGRITY Goal: RH STG ABLE TO PERFORM INCISION/WOUND CARE W/ASSISTANCE STG Able To Perform Incision/Wound Care With mod Assistance.  Outcome: Progressing Pt will be able to perform incision/wound care w/assistance and cues
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
## 2016-07-26 NOTE — Progress Notes (Signed)
Occupational Therapy Weekly Progress Note  Patient Details  Name: Gregory [name] MRN: [id number] Date of Birth: 1941/02/24  Beginning of progress report period: July 20, 2016 End of progress report period: July 26, 2016  Today's Date: 07/26/2016 OT Individual Time: 0902-1006 OT Individual Time Calculation (min): 64 min    Patient has met 5 of 5 short term goals.  Pt has made excellent progress over the past week with OT.  He is able to complete UB bathing in unsupported sitting with supervision as well as LB bathing sit to stand with mod assist.  UB dressing is also now completed at a mod assist level with max assist for LB.  Stand pivot transfers to 3:1 or wheelchair are at a max assist level, which is a huge improvement from eval, which was total assist +2 (pt 25%).  He continues to demonstrate limited endurance as well as as decreased memory with regards to orientation as well as problem solving.  Standing endurance has improved to over 3-4 mins for intervals of time with min assist when completing peri care or during LB dressing tasks.  Gregory [name] is using BUEs spontaneously with selfcare tasks but does exhibit some shoulder flexion limitations bilaterally to approximately 90 degrees.  Limitations in flexibility at the trunk and neck are still evident and are impacting his ability to complete sit to stand, as well as for maintaining midline orientation.  He tends to keep his head tilted slightly to the left.  Increased posterior tilt is present with increased posterior lean as well.  With sit to stand pt demonstrates limited trunk flexion resulting in the need for greater assist with sit to stand transitions and use his LEs on the bed to lever his body forward.  Increased knee flexion bilaterally with posterior lean is still present in standing.  At times he demonstrates pusher tendencies to the right in sitting and standing but this is somehow not consistent with each session, and seems to be  better during morning sessions.  Will continue to monitor in treatment.   Feel [name] will continue to benefit from comprehensive OT at CIR level at this time.  Family continues to be educated on his progress as well as things they can continue to reinforce when he is not in therapy.      Patient continues to demonstrate the following deficits: muscle weakness and muscle joint tightness, decreased cardiorespiratoy endurance and decreased oxygen support, impaired timing and sequencing, unbalanced muscle activation and decreased coordination, decreased midline orientation, decreased attention, decreased awareness, decreased problem solving, decreased safety awareness, decreased memory and delayed processing and decreased sitting balance, decreased standing balance, decreased postural control and decreased balance strategies and therefore will continue to benefit from skilled OT intervention to enhance overall performance with BADL.  Patient progressing toward long term goals..  Continue plan of care.  OT Short Term Goals Week 2:  OT Short Term Goal 1 (Week 2): Pt will complete UB dressing sitting unsupported with supervision for 2 consecutive sessions.  OT Short Term Goal 2 (Week 2): pt will complete LB bathing sit to stand with min assist 2 consecutive sessions.  OT Short Term Goal 3 (Week 2): Pt will complete LB dressing sit to stand with mod assist for 2 consecutive sessions.  OT Short Term Goal 4 (Week 2): Pt will complete stand pivot transfers to the [facility] with mod assist for toileting tasks.  OT Short Term Goal 5 (Week 2): Pt will maintain standing with min guard assist  while completing 2-3 grooming tasks.    Skilled Therapeutic Interventions/Progress Updates:    Pt completed bathing and dressing sitting EOB.  Positioned washpan with encouragement of forward trunk flexion and reaching.  Pt at times not reaching far enough forward to squeeze out washcloth without water spilling on the floor.  He  was able to demonstrate good midline orientation this session with static sitting balance at supervision level during UB selfcare and dynamic sitting balance at a min assist level for LB selfcare when crossing each LE over the opposite knee.  Increased posterior lean noted with this.  Mod assist needed for crossing each LE over the opposite knee and maintaining for washing his lower leg and also for dressing.  Pt needing only mod assist for sit to stand with mod demonstrational cueing for hand placement as well.  Once standing he was able to maintain static standing with min assist overall and for a few seconds (less than 5) without UE support and min guard assist.  Pt donned pull up shorts over the RLE but had placed them in the wrong leg hole.  Therapist helped to correct and then he needed mod assist to place the right one in.  [name] donned pullover shirt with mod assist as well demonstrating the need for assistance to pull it over his head and to pull it down over his trunk.  Finished session with stand pivot transfer to the wheelchair.  Pt with increased posterior lean during transfer and decreased step length bilaterally.  Pt left up in tilt in space wheelchair with safety belt in place and wrist restraints in place.  [name] in room as well.  Oxygen sats decreased to 86% after increase coughing and mucus production, but restored back to 96% within one minute.   Therapy Documentation Precautions:  Precautions Precautions: Fall Precaution Comments: B wrist restraints Restrictions Weight Bearing Restrictions: No  Vital Signs: Therapy Vitals Temp: 98.1 F (36.7 C) Temp Source: Oral Pulse Rate: 68 Resp: 18 BP: (!) 151/65 (RN notified) Patient Position (if appropriate): Sitting Oxygen Therapy SpO2: 100 % O2 Device: Tracheostomy Collar O2 Flow Rate (L/min): 6 L/min FiO2 (%): 28 % Pain: Slight abdominal pain at PEG site with trunk movements, 2/10 on the faces scale Pt  repositioned and given emotional support   See Function Navigator for Current Functional Status.   Therapy/Group: Individual Therapy  , OTR/L 07/26/2016, 3:21 PM

## 2016-07-26 NOTE — Progress Notes (Signed)
Pt has foley catheter in place. Urine dark yellow with a few small blot clots present. Notified Marissa NestlePam Love, PA with finding.

## 2016-07-27 ENCOUNTER — Inpatient Hospital Stay (HOSPITAL_COMMUNITY): Payer: Medicare Other | Admitting: Physical Therapy

## 2016-07-27 ENCOUNTER — Inpatient Hospital Stay (HOSPITAL_COMMUNITY): Payer: Medicare Other | Admitting: Speech Pathology

## 2016-07-27 ENCOUNTER — Inpatient Hospital Stay (HOSPITAL_COMMUNITY): Payer: Medicare Other | Admitting: Occupational Therapy

## 2016-07-27 LAB — GLUCOSE, CAPILLARY
GLUCOSE-CAPILLARY: 84 mg/dL (ref 65–99)
GLUCOSE-CAPILLARY: 96 mg/dL (ref 65–99)
Glucose-Capillary: 98 mg/dL (ref 65–99)
Glucose-Capillary: 88 mg/dL (ref 65–99)

## 2016-07-27 LAB — HEPARIN LEVEL (UNFRACTIONATED): Heparin Unfractionated: 0.48 IU/mL (ref 0.30–0.70)

## 2016-07-27 MED ORDER — HEPARIN (PORCINE) IN NACL 100-0.45 UNIT/ML-% IJ SOLN
1600.0000 [IU]/h | INTRAMUSCULAR | Status: DC
  Administered 2016-07-27 (×2): 1650 [IU]/h via INTRAVENOUS
  Filled 2016-07-27 (×2): qty 250

## 2016-07-27 MED ORDER — INSULIN ASPART 100 UNIT/ML ~~LOC~~ SOLN
0.0000 [IU] | Freq: Three times a day (TID) | SUBCUTANEOUS | Status: DC
  Administered 2016-07-31 – 2016-08-11 (×5): 1 [IU] via SUBCUTANEOUS

## 2016-07-27 MED ORDER — INSULIN ASPART 100 UNIT/ML ~~LOC~~ SOLN: 0.0000 [IU] | Freq: Every day | SUBCUTANEOUS | Status: DC

## 2016-07-27 MED ORDER — FREE WATER
100.0000 mL | Freq: Three times a day (TID) | Status: DC
  Administered 2016-07-27 – 2016-08-06 (×42): 100 mL

## 2016-07-27 MED ORDER — SCOPOLAMINE 1 MG/3DAYS TD PT72
1.0000 | MEDICATED_PATCH | TRANSDERMAL | Status: DC
  Administered 2016-07-27 – 2016-08-11 (×6): 1.5 mg via TRANSDERMAL
  Filled 2016-07-27 (×6): qty 1
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
## 2016-07-27 NOTE — Progress Notes (Signed)
Physical Therapy Weekly Progress Note  Patient Details  Name: Gregory [name] MRN: [id number] Date of Birth: [birth year]  Beginning of progress report period: July 20, 2016 End of progress report period: July 27, 2016  Today's Date: 07/27/2016 PT Individual Time: 1420-1520   PT Individual Time Calculation: 60 min   Patient has met 4 of 4 short term goals.  Pt has demonstrated good progress towards LTG with improved cardiovascular endurance, improved awareness, improved posture, as well as sitting and standing balance. Pt is not functioning at a mod assist to max assist level with gait, transfers, bed mobility, due to posterior Lean, poor sequencing of movements.   Patient continues to demonstrate the following deficits muscle weakness, decreased cardiorespiratoy endurance and decreased oxygen support, motor apraxia, decreased coordination and decreased motor planning, ideational apraxia, decreased awareness, decreased problem solving, decreased safety awareness, decreased memory and delayed processing and decreased sitting balance, decreased standing balance, decreased postural control, hemiplegia and decreased balance strategies and therefore will continue to benefit from skilled PT intervention to increase functional independence with mobility.  Patient progressing toward long term goals..  Continue plan of care.  PT Short Term Goals Week 1:  PT Short Term Goal 1 (Week 1): Patient will maintain sitting balance with mod assist  PT Short Term Goal 1 - Progress (Week 1): Met PT Short Term Goal 2 (Week 1): Patient will perform sit<>stand with max assist of 1  PT Short Term Goal 2 - Progress (Week 1): Met PT Short Term Goal 3 (Week 1): Patient will initiate gait training PT Short Term Goal 3 - Progress (Week 1): Met PT Short Term Goal 4 (Week 1): patient will perform standing balance with max assist of 1 for up to 30 seconds.  PT Short Term Goal 4 - Progress (Week 1): Met Week 2:  PT Short  Term Goal 1 (Week 2): Pt will performed WC mobility x 160f with supervision assist from PT PT Short Term Goal 2 (Week 2): Pt will ascend 4 steps(3") with mod assist  PT Short Term Goal 3 (Week 2): Pt will perform bed mobility with min assist  PT Short Term Goal 4 (Week 2): pt will ambulate 58fwith mod assist  PT Short Term Goal 5 (Week 2): pt will perform stand pivot transfer with consistent mod assist   Skilled Therapeutic Interventions/Progress Updates:   [facility]  PT transported patient to rehab gym with total assist in TIS WC. Pt instructed in standing tolerance task with intermittent supervision-min assist with moderate cues to prevent posterior LOB and improved WB on the forefoot. Sit<>stand transfers completed with mod assist throughout treatment with mod assist from PT with moderate cues for safety, anterior weight shift, and proper UE placement.   Gait training with RW and mod assist from PT completed 2x20 ft. PT required to provided mod-max multimodal cues for improved step length, improved posture, AD management and gait pattern with turn to sit.   PT provided pt with manual WC. WC mobility instructed by PT for 10062fith min-mod assist to maintain straight trajectory as well as to improve use of the RUE to prevent veer to the R.   Pt returned to room and performed stand pivot transfer to bed with Max assist from PT. Sit>supine completed with max assist due sto poor motor planning and requiring assist at trunk and BLE. Pt left supine in bed with call bell in reach and all needs met.  Therapy Documentation Precautions:  Precautions Precautions: Fall Precaution Comments: B wrist restraints Restrictions Weight Bearing Restrictions: No Vital Signs: Therapy Vitals Pulse Rate: 63 Resp: 16 Patient Position (if appropriate): Lying Oxygen Therapy SpO2: 100 % O2 Device: Tracheostomy Collar O2 Flow Rate (L/min): 6 L/min FiO2 (%): 28  % Pain:   0/10   See Function Navigator for Current Functional Status.  Therapy/Group: Individual Therapy  [name] 07/27/2016, 4:00 PM
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
## 2016-07-27 NOTE — Progress Notes (Signed)
Speech Language Pathology Weekly Progress and Session Note  Patient Details  Name: Gregory [name] MRN: [id number] Date of Birth: [birth year]  Beginning of progress report period: July 19, 2016 End of progress report period: July 26, 2016  Today's Date: 07/27/2016   Treatment Session #1 SLP Individual Time: 1030-1100 SLP Individual Time Calculation (min): 30 min   Treatment Session #2 SLP Individual Time: 1330-1400 SLP Individual time Calculation (min): 30 min  Short Term Goals: Week 1: SLP Short Term Goal 1 (Week 1): Patient will consume trials of ice chips without overt s/s of aspiration to assess readiness for repeat objective swallow assessment.  SLP Short Term Goal 1 - Progress (Week 1): Not met SLP Short Term Goal 2 (Week 1): Patient will increase speech intelligibility to ~100% at the word level with Min A multimodal cues for use of speech intelligibility strategies.  SLP Short Term Goal 2 - Progress (Week 1): Met SLP Short Term Goal 3 (Week 1): Pt will follow basic 1 step directions with Mod A verbal cues for 75% accuracy.  SLP Short Term Goal 3 - Progress (Week 1): Met SLP Short Term Goal 4 (Week 1): Patient will maintain arousal in regards to keeping his eyes open for ~60 seconds with Mod A verbal cues.  SLP Short Term Goal 4 - Progress (Week 1): Met SLP Short Term Goal 5 (Week 1): Pt will tolerate PMSV for ~30 minutes without decline in vitals.  SLP Short Term Goal 5 - Progress (Week 1): Met SLP Short Term Goal 6 (Week 1): Pt will complete basic familiar tasks (related to ADLs, brushing hair, oral care) with Mod A verbal cues.  SLP Short Term Goal 6 - Progress (Week 1): Not met    New Short Term Goals: Week 2: SLP Short Term Goal 1 (Week 2): Patient will consume trials of nectar-thick liquids via tsp with minimal overt s/s of aspiration to assess readiness for repeat objective swallow assessment.  SLP Short Term Goal 2 (Week 2): Patient will increase speech  intelligibility to ~75% at the phrase level with Mod A multimodal cues for use of speech intelligibility strategies.  SLP Short Term Goal 3 (Week 2): Pt will demonstrate sustained attention to a functional task for ~ 2 minutes with Mod A verbal cues. for redirection to task.  SLP Short Term Goal 4 (Week 2): Pt will tolerate PMSV for during all waking hours with full supervision without decline in vitals.  SLP Short Term Goal 5 (Week 2): Pt will complete basic familiar tasks (related to ADLs, brushing hair, oral care) with Mod A verbal cues.   Weekly Progress Updates: Pt has made great progress this reporting period as evidenced by meeting 4 of 6 STGs. Pt has made progress in the areas of PMSV toleration, increased speech intelligibility, increase ability to focus attention and follow single step directions when related to ADL tasks. Pt continues to demonstrate overt s/s of aspiration (coughing and wet voice) with trials of ice chips possibility d/t decreased swallow initiaition. As pt is more alert, pt also continues to express confusion of thought processes. Education provided to daughter and son on how to redirect pt with confusion.     Intensity: Minumum of 1-2 x/day, 30 to 90 minutes Frequency: 3 to 5 out of 7 days Duration/Length of Stay: 3 weeks Treatment/Interventions: Cognitive remediation/compensation;Cueing hierarchy;Dysphagia/aspiration precaution training;Environmental controls;Therapeutic Activities;Internal/external aids;Speech/Language facilitation;Functional tasks;Patient/family education   Daily Session  Skilled Therapeutic Interventions:   Treatment Session #1:  Skilled treatment session focused on  speech communication and dysphagia goals. SLP facilitated session by placing PMSV and training daughter Marye Round) how to donn and duff PMSV. Daughter able to return demonstration. Pt tolerated PMSV without any decline in respiratory status or O2 trapping. Pt with increased cough  strength. Pt required Max A verbal cues for oral care but was able to participate as well as wipe his mouth. Pt with appearance of delayed swallow initiation with trials of ice chips and inattention to bolus. Pt with frequent off-topic confused remarks throughout session. Pt not responsive to redirection. PMSV left on under full supervision of duagther with intermittent supervision every 30 minutes by SLP. Pt tolerated well. SLP removed PMSV when daughter left. SLP spoke with other therapies and pt tolerate PMSV well with them.   Treatment Session #2: Skilled treatment session focused on speech communication and dysphagia goals. SLP received pt from OT and PMSV in place from OT session. SLP facilitated session by providing Min A verbal cues for sustained attention to task for ~15 minutes. Pt's voice continues to be even cleared than morning session and no change in vitals with PMSV in place. Pt with decreased strength of cough this session. After set up, pt able to perform oral care with good initiation and attention. Trials of nectar thick liquids given via spoon with no cough present. Pt's voice was wet in 50% of trials but with Mod A verbal cues, pt able to attempt cough and swallow which cleared voice. No oral or tracheal suctioning required this session. Pt with improvement with overall ability and recommend PMSV during all waking hours under full staff supervision. Daughter Marye Round is only family member to provide supervision at this time. Pt was left upright, tilted back in tilt in space, restrains in place and PMSV removed. Continue per current plan of care.    Function:   Eating Eating   Modified Consistency Diet: Yes (Ice chips with SLP only)             Cognition Comprehension Comprehension assist level: Understands basic 25 - 49% of the time/ requires cueing 50 - 75% of the time  Expression Expression assistive device: Talk trach valve Expression assist level: Expresses basic 25 - 49%  of the time/requires cueing 50 - 75% of the time. Uses single words/gestures.  Social Interaction Social Interaction assist level: Interacts appropriately 25 - 49% of time - Needs frequent redirection.  Problem Solving Problem solving assist level: Solves basic 25 - 49% of the time - needs direction more than half the time to initiate, plan or complete simple activities  Memory Memory assist level: Recognizes or recalls less than 25% of the time/requires cueing greater than 75% of the time   General    Pain    Therapy/Group: Individual Therapy   Baelyn Doring B. Rutherford Nail, M.S., CCC-SLP Speech-Language Pathologist   Kerem Gilmer 07/27/2016, 12:29 PM

## 2016-07-27 NOTE — Progress Notes (Signed)
Late entry 07/24/2016 @ approximately 0550a  Upon assessing patient NGT found dislodges placement of tube was in left nare. no apparent skin irritation of discomfort noted to area, NGT discontinue,per protocol Patient was NPO for pending Peg Placement. Gregory [name] A.PA on department and notified of occurrence, informed to replace NGT, Md on department states to discontinue NGT since patient is planning for Peg placement. Informed oncoming Rn
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
## 2016-07-27 NOTE — Progress Notes (Addendum)
Occupational Therapy Session Note  Patient Details  Name: Gregory [name] MRN: [id number] Date of Birth: 04-17-1941  Today's Date: 07/27/2016 OT Individual Time: 0865-7846 OT Individual Time Calculation (min): 60 min    Short Term Goals: Week 2:  OT Short Term Goal 1 (Week 2): Pt will complete UB dressing sitting unsupported with supervision for 2 consecutive sessions.  OT Short Term Goal 2 (Week 2): pt will complete LB bathing sit to stand with min assist 2 consecutive sessions.  OT Short Term Goal 3 (Week 2): Pt will complete LB dressing sit to stand with mod assist for 2 consecutive sessions.  OT Short Term Goal 4 (Week 2): Pt will complete stand pivot transfers to the [facility] with mod assist for toileting tasks.  OT Short Term Goal 5 (Week 2): Pt will maintain standing with min guard assist while completing 2-3 grooming tasks.    Skilled Therapeutic Interventions/Progress Updates:   Session 1:  Pt completed bathing sitting on EOB.  Close supervision for sitting balance while completing UB bathing with min assist needed when attempting to cross LEs over the opposite knee and wash his feet.  Increased posterior lean noted secondary to tightness in his hips and lower back.  Min assist needed to complete slight forward trunk flexion in order to reach his feet with BUEs in order to donn gripper socks.  Mod assist for sit to stand in order to wash peri area.  Once standing he was able to wash the front but needed max assist for buttocks secondary to having a BM at the time and not being aware of it.  Placed bedpan under him and had him sit down on it but he was unable to determine if he was going.  Performed hygiene in standing with nursing present to donn a new foam dressing.  Therapist assisted with all aspects of dressing secondary to decreased time.  He was able to complete stand pivot transfer to the wheelchair from the bed with mod assist.  PMV used during session without any difficulty.  Pt still  needed repeating of statements at times.  Oxygen sats remained greater than 96% on 6Ls trach collar at 28%.  Pt oriented to place this session but still with increased confusion stating that the surgeon that placed his tube in his stomach came by last night and they watched Lenetta Quaker.  Easily re-directed to task when distracted.  Pt left in wheelchair with safety belt and wrist restraints in place.    Session 2:  Pt up in wheelchair to start session.  Removed restraints from wrist as well as safety belt.  Had pt work on static standing with use of the RW for support.  Min assist for standing with BUE supported on the walker.  Worked on removing one hand at a time while still maintaining standing.  Pt frequently attempting to reach back to the handle of the walker for support however.  Had him complete combing his hair while standing with mod instructional cueing for thoroughness.  Incorporated use of the right UE for combing the right side but pt predominantly uses the LUE for most of task.  He was able to maintain standing for close to 4.5 mins on first attempt.  Increased hip, knee, trunk, and cervical flexion noted, which increased the longer he stood.  Second interval of standing lasted for 7.5 mins without use of the RW but instead with therapist support standing in front of him.  Increased posterior lean in standing noted  but pt able to correct and complete some hip extension in order to increase weightbearing over the forefoot instead of his heels.  Worked on weightshifts side to side for greater step length with pt taking 3-4 small steps forward with mod assist an backwards.  Increased difficulty with stepping backward noted, especially with advancement of the RLE.  Oxygen sats remained stable during session on 6 Ls trach collar at 28%.  Pt left with SLP for next session.    Therapy Documentation Precautions:  Precautions Precautions: Fall Precaution Comments: B wrist restraints Restrictions Weight  Bearing Restrictions: No  Vital Signs: Therapy Vitals Pulse Rate: 73 Resp: 16 Patient Position (if appropriate): Sitting Oxygen Therapy SpO2: 100 % O2 Device: Tracheostomy Collar O2 Flow Rate (L/min): 6 L/min FiO2 (%): 28 % Pain: Pain Assessment Pain Assessment: No/denies pain ADL: See Function Navigator for Current Functional Status.   Therapy/Group: Individual Therapy  Jaimes Eckert OTR/L 07/27/2016, 12:36 PM
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
## 2016-07-27 NOTE — Progress Notes (Signed)
Pt had a good night, was vocal and pleasant at start of shift, telling about his family and pointing to pictures on his walls.  Pt continues to have copious secretions but they are not pink tinged, but more white and slightly yellow.  Pt's joints and body is stiff, so maximum effort is required to turn and reposition him.    Pt's foley is draining yellow urine now, with no traces of blood. SCDs were in place all night.  Heparin was started this morning.  Pt has been calm, has been cooperative, and slept well.  Daughter Lowanda FosterBrittany has called the last two nights to check on her dad and should be in to visit today. Pt told of this and winked and nodded at me.    Tera HelperPamela  Samora Jernberg
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
## 2016-07-27 NOTE — Progress Notes (Deleted)
Pt's sisters and a male visitor were in room when I entered w/CNA.  Spent 30 minutes in room bathing pt at family's request.  Pt uncooperative, but family makes demands and is distractive during care. After a minimum of 30 minutes, bath was completed.  Family did not assist in any way but only detracted.  At one point when sister was pointing out areas we were missing in applying lotion, I asked if she would like to help by lotioning one leg while I did the other.  She emphatically stated no. One sister stated we were to do all care here and not to ask them for help.  They were watching us and would be able to take care of pt based on watching us. Then the pt's sister said they had many relatives who would be doing pt's care in [place]. I asked her if she would be one of those relatives but she would not answer. Later, before leaving the room, sister said they had a special nurse they would have to do all pt's care.    Leaving the room, I went to attend to my other patients.  I sensed a very hostile, overbearing attitude of the sisters in the room, picked up on by the patient. It is difficult to do care in this environment.  Gregory [name]  

## 2016-07-27 NOTE — Plan of Care (Signed)
Problem: RH BOWEL ELIMINATION Goal: RH STG MANAGE BOWEL W/MEDICATION W/ASSISTANCE STG Manage Bowel with Medication with mod Assistance.   Outcome: Progressing Pt will increase his ability to communicate his bowel medical and assistance needs and be free from incontinence.

## 2016-07-27 NOTE — Progress Notes (Signed)
Subjective/Complaints: Appreciate urology note Still with increased secretions via trach. Severe dysarthria  ROS: Limited due to cognition  Objective: Vital Signs: Blood pressure (!) 125/58, pulse (!) 58, temperature 98.1 F (36.7 C), temperature source Oral, resp. rate 16, height 5\' 9"  (1.753 m), weight 88 kg (194 lb 0.1 oz), SpO2 97 %. Ir Gastrostomy Tube Mod Sed  Result Date: 07/25/2016 INDICATION: Dysphagia. In please place percutaneous gastrostomy tube for enteric nutrition supplementation. EXAM: PULL TROUGH GASTROSTOMY TUBE PLACEMENT COMPARISON:  None. MEDICATIONS: Ancef 2 gm IV; Antibiotics were administered within 1 hour of the procedure. Glucagon 1 mg IV CONTRAST:  20 mL of Isovue 300 administered into the gastric lumen. ANESTHESIA/SEDATION: Moderate (conscious) sedation was employed during this procedure. A total of Versed 1 mg and Fentanyl 50 mcg was administered intravenously. Moderate Sedation Time: 10 minutes. The patient's level of consciousness and vital signs were monitored continuously by radiology nursing throughout the procedure under my direct supervision. FLUOROSCOPY TIME:  2 minutes 54 seconds (20 mGy) COMPLICATIONS: None immediate. PROCEDURE: Informed written consent was obtained from the patient's family following explanation of the procedure, risks, benefits and alternatives. A time out was performed prior to the initiation of the procedure. Ultrasound scanning was performed to demarcate the edge of the left lobe of the liver. Maximal barrier sterile technique utilized including caps, mask, sterile gowns, sterile gloves, large sterile drape, hand hygiene and Betadine prep. The left upper quadrant was sterilely prepped and draped. An oral gastric catheter was inserted into the stomach under fluoroscopy. The existing nasogastric feeding tube was removed. The left costal margin and air opacified transverse colon were identified and avoided. Air was injected into the stomach for  insufflation and visualization under fluoroscopy. Under sterile conditions a 17 gauge trocar needle was utilized to access the stomach percutaneously beneath the left subcostal margin after the overlying soft tissues were anesthetized with 1% Lidocaine with epinephrine. Needle position was confirmed within the stomach with aspiration of air and injection of small amount of contrast. A single T tack was deployed for gastropexy. Over an Amplatz guide wire, a 9-French sheath was inserted into the stomach. A snare device was utilized to capture the oral gastric catheter. The snare device was pulled retrograde from the stomach up the esophagus and out the oropharynx. The 20-French pull-through gastrostomy was connected to the snare device and pulled antegrade through the oropharynx down the esophagus into the stomach and then through the percutaneous tract external to the patient. The gastrostomy was assembled externally. Contrast injection confirms position in the stomach. Several spot radiographic images were obtained in various obliquities for documentation. The patient tolerated procedure well without immediate post procedural complication. FINDINGS: After successful fluoroscopic guided placement, the gastrostomy tube is appropriately positioned with internal disc against the ventral aspect of the gastric lumen. IMPRESSION: Successful fluoroscopic insertion of a 20-French pull-through gastrostomy tube. The gastrostomy may be used immediately for medication administration and in 24 hrs for the initiation of feeds. Electronically Signed   By: [name] M.D.   On: 07/25/2016 17:37   Results for orders placed or performed during the hospital encounter of 07/19/16 (from the past 72 hour(s))  Glucose, capillary     Status: None   Collection Time: 07/24/16  8:30 AM  Result Value Ref Range   Glucose-Capillary 99 65 - 99 mg/dL  Glucose, capillary     Status: None   Collection Time: 07/24/16 11:47 AM  Result Value  Ref Range   Glucose-Capillary 96 65 - 99  mg/dL   Comment 1 Notify RN   Glucose, capillary     Status: Abnormal   Collection Time: 07/24/16  4:22 PM  Result Value Ref Range   Glucose-Capillary 112 (H) 65 - 99 mg/dL  Glucose, capillary     Status: Abnormal   Collection Time: 07/24/16  7:56 PM  Result Value Ref Range   Glucose-Capillary 106 (H) 65 - 99 mg/dL  Glucose, capillary     Status: Abnormal   Collection Time: 07/24/16 11:49 PM  Result Value Ref Range   Glucose-Capillary 126 (H) 65 - 99 mg/dL  Surgical PCR screen     Status: Abnormal   Collection Time: 07/25/16  2:04 AM  Result Value Ref Range   MRSA, PCR POSITIVE (A) NEGATIVE    Comment: RESULT CALLED TO, READ BACK BY AND VERIFIED WITH: S ROACH,RN @0506  07/25/16 MKELLY,MLT    Staphylococcus aureus POSITIVE (A) NEGATIVE    Comment:        The Xpert SA Assay (FDA approved for NASAL specimens in patients over 77 years of age), is one component of a comprehensive surveillance program.  Test performance has been validated by [facility] for patients greater than or equal to 77 year old. It is not intended to diagnose infection nor to guide or monitor treatment.   Glucose, capillary     Status: None   Collection Time: 07/25/16  4:10 AM  Result Value Ref Range   Glucose-Capillary 83 65 - 99 mg/dL  Glucose, capillary     Status: None   Collection Time: 07/25/16  8:31 AM  Result Value Ref Range   Glucose-Capillary 92 65 - 99 mg/dL  Glucose, capillary     Status: None   Collection Time: 07/25/16 12:45 PM  Result Value Ref Range   Glucose-Capillary 96 65 - 99 mg/dL  Protime-INR     Status: None   Collection Time: 07/25/16  3:47 PM  Result Value Ref Range   Prothrombin Time 15.1 11.4 - 15.2 seconds   INR 1.19   Glucose, capillary     Status: Abnormal   Collection Time: 07/25/16  6:25 PM  Result Value Ref Range   Glucose-Capillary 107 (H) 65 - 99 mg/dL  Glucose, capillary     Status: None   Collection Time: 07/25/16   8:30 PM  Result Value Ref Range   Glucose-Capillary 81 65 - 99 mg/dL  Glucose, capillary     Status: None   Collection Time: 07/25/16 11:53 PM  Result Value Ref Range   Glucose-Capillary 99 65 - 99 mg/dL  Glucose, capillary     Status: None   Collection Time: 07/26/16  3:37 AM  Result Value Ref Range   Glucose-Capillary 90 65 - 99 mg/dL  Glucose, capillary     Status: None   Collection Time: 07/26/16  8:11 AM  Result Value Ref Range   Glucose-Capillary 85 65 - 99 mg/dL  Glucose, capillary     Status: None   Collection Time: 07/26/16 12:19 PM  Result Value Ref Range   Glucose-Capillary 86 65 - 99 mg/dL  CBC     Status: Abnormal   Collection Time: 07/26/16  1:03 PM  Result Value Ref Range   WBC 8.0 4.0 - 10.5 K/uL   RBC 3.63 (L) 4.22 - 5.81 MIL/uL   Hemoglobin 10.2 (L) 13.0 - 17.0 g/dL   HCT 40.932.0 (L) 81.139.0 - 91.452.0 %   MCV 88.2 78.0 - 100.0 fL   MCH 28.1 26.0 - 34.0 pg  MCHC 31.9 30.0 - 36.0 g/dL   RDW 16.1 (H) 09.6 - 04.5 %   Platelets 315 150 - 400 K/uL  Glucose, capillary     Status: None   Collection Time: 07/26/16  4:36 PM  Result Value Ref Range   Glucose-Capillary 70 65 - 99 mg/dL  Glucose, capillary     Status: None   Collection Time: 07/26/16  8:34 PM  Result Value Ref Range   Glucose-Capillary 82 65 - 99 mg/dL   Comment 1 Notify RN      HEENT: Normocephalic. Healing trauma Neck: #6 shiley, significant secretions, white thick Cardio: RRR. No JVD.  Resp: Ronchi and Unlabored GI: BS positive and ND, PEG site clean, dry and intact. Nontender to palpation. No drainage  Skin:   Other trach site without bleeding Neuro: Somnolent Sensory cannot assess due to somnolence , Motor: Unable to assess this AM due to somnolence  Musc/Skel:  No edema. No tenderness.  Gen NAD, Vital signs reviewed.    Assessment/Plan: 1. Functional deficits secondary to bilateral cortical and subcortical cardioembolic infarcts which require 3+ hours per day of interdisciplinary therapy in a  comprehensive inpatient rehab setting. Physiatrist is providing close team supervision and 24 hour management of active medical problems listed below. Physiatrist and rehab team continue to assess barriers to discharge/monitor patient progress toward functional and medical goals. FIM: Function - Bathing Position: Sitting EOB Body parts bathed by patient: Right arm, Left arm, Chest, Abdomen, Right upper leg, Left upper leg, Front perineal area Body parts bathed by helper: Right lower leg, Left lower leg, Buttocks, Back Assist Level: 2 helpers  Function- Upper Body Dressing/Undressing What is the patient wearing?: Hospital gown Pull over shirt/dress - Perfomed by patient: Thread/unthread right sleeve, Thread/unthread left sleeve Pull over shirt/dress - Perfomed by helper: Put head through opening, Pull shirt over trunk Assist Level: 2 helpers Function - Lower Body Dressing/Undressing What is the patient wearing?: Non-skid slipper socks Position: Wheelchair/chair at sink Pants- Performed by patient: Thread/unthread right pants leg Pants- Performed by helper: Thread/unthread left pants leg, Pull pants up/down Non-skid slipper socks- Performed by helper: Don/doff right sock, Don/doff left sock Socks - Performed by helper: Don/doff right sock, Don/doff left sock Assist for lower body dressing: 2 Helpers  Function - Toileting Toileting activity did not occur: No continent bowel/bladder event Toileting steps completed by patient: Adjust clothing after toileting Toileting steps completed by helper: Adjust clothing prior to toileting, Performs perineal hygiene, Adjust clothing after toileting Toileting Assistive Devices: Grab bar or rail Assist level: More than reasonable time, Two helpers  Function - Archivist transfer activity did not occur: Safety/medical concerns Toilet transfer assistive device: Elevated toilet seat/BSC over toilet, Mechanical lift Mechanical lift:  Stedy Assist level to toilet: 2 helpers Assist level from toilet: 2 helpers Assist level to bedside commode (at bedside): 2 helpers Assist level from bedside commode (at bedside): 2 helpers  Function - Chair/bed transfer Chair/bed transfer method: Stand pivot Chair/bed transfer assist level: Moderate assist (Pt 50 - 74%/lift or lower) Chair/bed transfer assistive device: Armrests, Walker Mechanical lift: Stedy Chair/bed transfer details: Verbal cues for technique, Tactile cues for posture, Verbal cues for sequencing, Manual facilitation for weight shifting, Manual facilitation for placement  Function - Locomotion: Wheelchair Wheelchair activity did not occur: Safety/medical concerns Wheel 50 feet with 2 turns activity did not occur: Safety/medical concerns Wheel 150 feet activity did not occur: Safety/medical concerns Function - Locomotion: Ambulation Ambulation activity did not occur: Safety/medical concerns Assistive  device: Walker-rolling Max distance: 56ft Assist level: Moderate assist (Pt 50 - 74%) Walk 10 feet activity did not occur: Safety/medical concerns Assist level: Moderate assist (Pt 50 - 74%) Walk 50 feet with 2 turns activity did not occur: Safety/medical concerns Walk 150 feet activity did not occur: Safety/medical concerns Walk 10 feet on uneven surfaces activity did not occur: Safety/medical concerns  Function - Comprehension Comprehension: Auditory Comprehension assist level: Understands basic 25 - 49% of the time/ requires cueing 50 - 75% of the time  Function - Expression Expression: Verbal Expression assistive device: Talk trach valve Expression assist level: Expresses basic 25 - 49% of the time/requires cueing 50 - 75% of the time. Uses single words/gestures.  Function - Social Interaction Social Interaction assist level: Interacts appropriately 25 - 49% of time - Needs frequent redirection.  Function - Problem Solving Problem solving assist level:  Solves basic 25 - 49% of the time - needs direction more than half the time to initiate, plan or complete simple activities  Function - Memory Memory assist level: Recognizes or recalls less than 25% of the time/requires cueing greater than 75% of the time Patient normally able to recall (first 3 days only): That he or she is in a hospital  Medical Problem List and Plan: 1. 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 DVT Prophylaxis/Anticoagulation: Pharmaceutical: Other (comment)--Eliquis d/ced due to increase in bloody secretions, no further streaking, deep suctioning stopped, consider resuming vs IV hep after PEG placed, although pt has had bleeding from foley during last rehab stay while on IV heparin  3. Pain Management: tylenol prn pain. Question muscle spasms--will monitor 4. Mood: LCSW to follow for evaluation as mentation improves.  5. Neuropsych: This patient is notcapable of making decisions on hisown behalf. 6. Skin/Wound Care: routine pressure relief measures.  7. Fluids/Electrolytes/Nutrition: Monitor I/O and adjust fluid boluses as needed. Continue tube feeds. . 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 Tolerated PMV on #6 trach  While still having secretions, sats have remained normal, secretions thick but last dose robinul 3/5 , discussed with daughter, the pros and cons of adding an anticholinergic agent such as scopolamine patch, pulmonary, discontinued the patch due to concerns about potential sedation. However, cognitive deficits and lethargy are related to his CVA and not medications 9. Parkinson's disease: Continue Sinemet 37.5/150 qid.  10. Leucocytosis: Resolving. Monitor for signs of infection.  11. ABLA: Continue to  monitor--has been fluctuating from 9.6-->10.4, 9.3 on 3/2, stool guaic remains pending, repeat 10.2 on 3/8, may reflect some hemoconcentration   12. Hematuria: Continue to monitor for now. May need flushes resumed if worsens.  13. Severe dysphagia: NPO till mentation improv  tolerating PEG feedings   LOS (Days) 8 A FACE TO FACE EVALUATION WAS PERFORMED  Gregory [name] 07/27/2016, 5:31 AM

## 2016-07-28 ENCOUNTER — Inpatient Hospital Stay (HOSPITAL_COMMUNITY): Payer: Medicare Other | Admitting: Speech Pathology

## 2016-07-28 ENCOUNTER — Inpatient Hospital Stay (HOSPITAL_COMMUNITY): Payer: Medicare Other | Admitting: Occupational Therapy

## 2016-07-28 ENCOUNTER — Inpatient Hospital Stay (HOSPITAL_COMMUNITY): Payer: Medicare Other | Admitting: Physical Therapy

## 2016-07-28 LAB — GLUCOSE, CAPILLARY
GLUCOSE-CAPILLARY: 97 mg/dL (ref 65–99)
GLUCOSE-CAPILLARY: 100 mg/dL — AB (ref 65–99)
GLUCOSE-CAPILLARY: 94 mg/dL (ref 65–99)
GLUCOSE-CAPILLARY: 115 mg/dL — AB (ref 65–99)
Glucose-Capillary: 111 mg/dL — ABNORMAL HIGH (ref 65–99)

## 2016-07-28 LAB — HEPARIN LEVEL (UNFRACTIONATED): HEPARIN UNFRACTIONATED: 0.7 [IU]/mL (ref 0.30–0.70)

## 2016-07-28 LAB — CBC
HCT: 30.7 % — ABNORMAL LOW (ref 39.0–52.0)
Hemoglobin: 9.4 g/dL — ABNORMAL LOW (ref 13.0–17.0)
MCH: 27.1 pg (ref 26.0–34.0)
MCHC: 30.6 g/dL (ref 30.0–36.0)
MCV: 88.5 fL (ref 78.0–100.0)
PLATELETS: 285 10*3/uL (ref 150–400)
RBC: 3.47 MIL/uL — ABNORMAL LOW (ref 4.22–5.81)
RDW: 16.8 % — ABNORMAL HIGH (ref 11.5–15.5)
WBC: 6.4 10*3/uL (ref 4.0–10.5)

## 2016-07-28 NOTE — Progress Notes (Signed)
ANTICOAGULATION CONSULT NOTE - Follow Up Consult  Pharmacy Consult for heparin Indication: atrial fibrillation and CVA  Allergies  Allergen Reactions  . No Known Allergies     Patient Measurements: Height: 5\' 9"  (175.3 cm) Weight: 196 lb 3.4 oz (89 kg) IBW/kg (Calculated) : 70.7  Vital Signs: Temp: 97.8 F (36.6 C) (03/10 0536) Temp Source: Oral (03/10 0536) BP: 124/51 (03/10 0536) Pulse Rate: 61 (03/10 0802)  Labs:  Recent Labs  07/25/16 1547 07/26/16 1303 07/27/16 1716 07/28/16 0708  HGB  --  10.2*  --  9.4*  HCT  --  32.0*  --  30.7*  PLT  --  315  --  285  LABPROT 15.1  --   --   --   INR 1.19  --   --   --   HEPARINUNFRC  --   --  0.48 0.70    Estimated Creatinine Clearance: 86.7 mL/min (by C-G formula based on SCr of 0.76 mg/dL).  Assessment: 76 yo M admitted 2/9/2018with recent embolic stroke (0/981/18) secondary to R-ICA stenosis and afib. Pt now in rehab - heparin switched to Eliquis during admission. Last dose of Eliquis 3/3 pm - held since for hemoptysis which has resolved. Heparin level this AM at top of range at 0.70. CBC down but within normal limits. Patient has had dark amber colored urine. Nurse is monitoring.   Heparin level therapeutic: 0.70  Goal of Therapy:  Heparin level 0.3-0.5 units/ml Monitor platelets by anticoagulation protocol: Yes   Plan:  Decrease heparin to 1600 units/hr to prevent supratherapeutic level  8 hour heparin level Daily heparin level and CBC Follow up long term anticoagulation plan  Bailey MechEmily Stewart, PharmD PGY1 Pharmacy Resident Pager: [phone] 07/28/2016 11:33 AM  Patient now has blood in foley bag. Heparin discontinued.   Hillis RangeEmily A Stewart, PharmD

## 2016-07-28 NOTE — Progress Notes (Signed)
Occupational Therapy Session Note  Patient Details  Name: Candler Ginsberg MRN: [id number] Date of Birth: 29-Sep-1940  Today's Date: 07/28/2016 OT Individual Time: [phone] and 1446-1535 OT Individual Time Calculation (min): 64 min and 49 min   Short Term Goals: Week 1:  OT Short Term Goal 1 (Week 1): Pt will maintain sustained attention to selfcare tasks for 20 mins with no more than min instructional cueing for re-direction.  OT Short Term Goal 1 - Progress (Week 1): Met OT Short Term Goal 2 (Week 1): Pt will maintain dynamic sitting EOB or EOC during ADL with no more than mod assist.  OT Short Term Goal 2 - Progress (Week 1): Met OT Short Term Goal 3 (Week 1): Pt will complete UB bathing with supervision sitting unsupported.  OT Short Term Goal 3 - Progress (Week 1): Met OT Short Term Goal 4 (Week 1): Pt will completed LB bathing sit to stand with max assist of one.  OT Short Term Goal 4 - Progress (Week 1): Met OT Short Term Goal 5 (Week 1): Pt will donn pullup pants with max assist using AE sit to stand.  OT Short Term Goal 5 - Progress (Week 1): Met Week 2:  OT Short Term Goal 1 (Week 2): Pt will complete UB dressing sitting unsupported with supervision for 2 consecutive sessions.  OT Short Term Goal 2 (Week 2): pt will complete LB bathing sit to stand with min assist 2 consecutive sessions.  OT Short Term Goal 3 (Week 2): Pt will complete LB dressing sit to stand with mod assist for 2 consecutive sessions.  OT Short Term Goal 4 (Week 2): Pt will complete stand pivot transfers to the [facility] with mod assist for toileting tasks.  OT Short Term Goal 5 (Week 2): Pt will maintain standing with min guard assist while completing 2-3 grooming tasks.    Skilled Therapeutic Interventions/Progress Updates: Pt was lying in bed with RN at time of arrival, agreeable to session. Pt verbalized desire to complete ADLs with PMV in place. Bathing/dressing was set up at EOB with pt able to complete  supine<sit with close supervision. Tx focus on ADL retraining, attention, problem solving, standing balance and postural control. He completed bathing with overall Min A with cues for attention and implementation of figure 4 position for washing LEs. Pt exhibited posterior lean when standing for pericare with RW, multimodal cues provided to correct. He was able to doff/don his socks today during dressing. At end of session pt completed stand pivot transfer to standard w/c (anticipated PT in 1 hour) with RW and Mod A. Max cues for safe hand placement. 02 sats during session <95% on  Supplemental 02 from trach collar. He was left in care of RN at time of departure.   2nd Session 1:1 tx (49 min) Pt was reclined in TIS w/c with family present at time of arrival. Tx focus on cognitive skills and B UE AROM/coordination. Pt engaged in pipe tree activity while seated. He required max instructional/questioning cues for organization, sequencing, attention, and problem solving. Perceptual skills to be further assessed, pt instructed to place pipes back into container and he dropped them slightly in front of container 50% of the time. Max multimodal cues for integrating R UE into task. He completed 2 simple structures during session. 02 sats <98% during session on supplemental 02 via trach collar. At end of session pt was left with family and RN to help spouse Marcie Bal trim his hair at the sink.  Therapy Documentation Precautions:  Precautions Precautions: Fall Precaution Comments: B wrist restraints Restrictions Weight Bearing Restrictions: No  Pain: No c/o pain during session  Pain Assessment Pain Assessment: No/denies pain ADL:     See Function Navigator for Current Functional Status.   Therapy/Group: Individual Therapy  Ishitha Roper A Laquashia Mergenthaler 07/28/2016, 12:40 PM

## 2016-07-28 NOTE — Progress Notes (Signed)
Patient with cherry red urine in foley bag, a change from this morning where it was noted to be orange in color.  Notified both Dr. Wynn BankerKirsteins and pharmacist.  Pharmacy recommends holding heparin for 1 hour then follow their recommendations for continuation.  Dr. Wynn BankerKirsteins to follow up with patient and family and is present at bedside at this time.  Patient appears to not be bleeding anywhere else at this time.  Dani Gobbleeardon, Quinlyn Tep J, RN

## 2016-07-28 NOTE — Progress Notes (Addendum)
Speech Language Pathology Daily Session Note  Patient Details  Name: Gregory [name] MRN: [id number] Date of Birth: [birth year]  Today's Date: 07/28/2016   Treatment session #1 SLP Individual Time: 1100-1115 SLP Individual Time Calculation (min): 15 min   Treatment session #2 SLP Individual Time: 1330-1400 SLP Individual Time Calculation (min):30 min  Short Term Goals: Week 2: SLP Short Term Goal 1 (Week 2): Patient will consume trials of nectar-thick liquids via tsp with minimal overt s/s of aspiration to assess readiness for repeat objective swallow assessment.  SLP Short Term Goal 2 (Week 2): Patient will increase speech intelligibility to ~75% at the phrase level with Mod A multimodal cues for use of speech intelligibility strategies.  SLP Short Term Goal 3 (Week 2): Pt will demonstrate sustained attention to a functional task for ~ 2 minutes with Mod A verbal cues. for redirection to task.  SLP Short Term Goal 4 (Week 2): Pt will tolerate PMSV for during all waking hours with full supervision without decline in vitals.  SLP Short Term Goal 5 (Week 2): Pt will complete basic familiar tasks (related to ADLs, brushing hair, oral care) with Mod A verbal cues.   Skilled Therapeutic Interventions:  Treatment Session #1: Skilled treatment session focused on PMSV toleration and education to 2 additional children. Pt received from PT, PMSV already in place and pt tolerating well. Education provided to Guardian Life InsuranceChristopher and on how to [employer]donn and duff PMSV. Both were able to  return demonstrate therefore PMSV left in place while under supervision of children. They were to remove when leaving room. Pt left in their care.   Treatment Session #2: Skilled treatment session focused on cognition goals targeting PMSV toleration and education to wife and [name] on how to donn and duff PMSV. Pt had just been suctioned by nursing (total of 3 times for day, continues to decrease). Pt tolerated valve without change  in vitals. Pt alert and interactive with wife. Voice intermittently wet with Max A cues to cough. Pt able to produce stronger cough than previous day and able to clear vocal quality. Pt was left with PMSV in place under full supervision of wife and son, upright in chair, safety belt on and 1 restraint on, all needs within reach. Wife and son will remove PMSV if when they leave and notify nursing to replace restraint. Continue per current plan of care.      Function:  Eating Eating   Modified Consistency Diet: Yes (With SLP only)             Cognition Comprehension Comprehension assist level: Understands basic 25 - 49% of the time/ requires cueing 50 - 75% of the time  Expression Expression assistive device: Talk trach valve Expression assist level: Expresses basic 25 - 49% of the time/requires cueing 50 - 75% of the time. Uses single words/gestures.  Social Interaction Social Interaction assist level: Interacts appropriately 25 - 49% of time - Needs frequent redirection.  Problem Solving Problem solving assist level: Solves basic 25 - 49% of the time - needs direction more than half the time to initiate, plan or complete simple activities  Memory Memory assist level: Recognizes or recalls less than 25% of the time/requires cueing greater than 75% of the time    Pain Pain Assessment Pain Assessment: No/denies pain  Therapy/Group: Individual Therapy   Mekaila Tarnow B. Dreama Saaverton, M.S., CCC-SLP Speech-Language Pathologist   Gwendolin Briel 07/28/2016, 11:57 AM

## 2016-07-28 NOTE — Progress Notes (Signed)
Physical Therapy Session Note  Patient Details  Name: Gregory [name] MRN: [id number] Date of Birth: 05-Jan-1941  Today's Date: 07/28/2016 PT Individual Time: 1004-1103 PT Individual Time Calculation (min): 59 min   Short Term Goals: Week 2:  PT Short Term Goal 1 (Week 2): Pt will performed WC mobility x 16620ft with supervision assist from PT PT Short Term Goal 2 (Week 2): Pt will ascend 4 steps(3") with mod assist  PT Short Term Goal 3 (Week 2): Pt will perform bed mobility with min assist  PT Short Term Goal 4 (Week 2): pt will ambulate 5750ft with mod assist  PT Short Term Goal 5 (Week 2): pt will perform stand pivot transfer with consistent mod assist   Skilled Therapeutic Interventions/Progress Updates:   Pt received sitting in WC and agreeable to PT  Pt performed WC mobility using BUE for propulsion for 2 bouts of 17800ft with mod assist from PT to improve the use of the RUE and maintain straight path with WC.   Gait training with RW and mod assist from PT x 8543ft. Max cues for improved posture, step length, and AD management.   PT instructed patient in stair training to ascend and descend 8 steps (3") with mod assist and max cues for Step-to gait pattern, proper UE placement, improved sequencing with UE and LE and anterior weight shift.   Standing balance with one-zero UE support for 3 bouts of 2 minutes with min assist from PT to improve use of hip and ankle strategies to prevent posterior LOB.   Stand pivot transfer with mod assist to TIS WC using RW. Max cues to prevent posterior LOB as well as increase lateral step length.   Sit<>stand transfers completed with mod assist throughout treatment with moderate multimodal cues for proper UE placement and anterior excursion to prevent posterior lean.   SpO2 remained >94% with all activity on this day using Supplemental O2 from trach collar.      Therapy Documentation Precautions:  Precautions Precautions: Fall Precaution  Comments: B wrist restraints Restrictions Weight Bearing Restrictions: No General:   Vital Signs: Therapy Vitals Pulse Rate: 63 Resp: 16 Patient Position (if appropriate): Sitting Oxygen Therapy SpO2: 99 % O2 Device: Tracheostomy Collar FiO2 (%): 28 % Pain: Pain Assessment Pain Assessment: No/denies pain   See Function Navigator for Current Functional Status.   Therapy/Group: Individual Therapy  Gregory [name] 07/28/2016, 2:23 PM

## 2016-07-28 NOTE — Progress Notes (Signed)
ANTICOAGULATION CONSULT NOTE - Follow Up Consult  Pharmacy Consult for heparin Indication: atrial fibrillation and CVA  Allergies  Allergen Reactions  . No Known Allergies     Patient Measurements: Height: 5\' 9"  (175.3 cm) Weight: 196 lb 3.4 oz (89 kg) IBW/kg (Calculated) : 70.7  Vital Signs: Temp: 97.8 F (36.6 C) (03/10 0536) Temp Source: Oral (03/10 0536) BP: 124/51 (03/10 0536) Pulse Rate: 61 (03/10 0802)  Labs:  Recent Labs  07/25/16 1547 07/26/16 1303 07/27/16 1716 07/28/16 0708  HGB  --  10.2*  --  9.4*  HCT  --  32.0*  --  30.7*  PLT  --  315  --  285  LABPROT 15.1  --   --   --   INR 1.19  --   --   --   HEPARINUNFRC  --   --  0.48 0.70    Estimated Creatinine Clearance: 86.7 mL/min (by C-G formula based on SCr of 0.76 mg/dL).  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 Pt now in rehab - heparin switched to Eliquis during admission. Last dose of Eliquis 3/3 pm - held since for hemoptysis which has resolved. Heparin level this AM at top of range at 0.70. CBC down but within normal limits. Patient has had dark amber colored urine. Nurse is monitoring.   Heparin level therapeutic: 0.70  Goal of Therapy:  Heparin level 0.3-0.5 units/ml Monitor platelets by anticoagulation protocol: Yes   Plan:  Decrease heparin to 1600 units/hr to prevent supratherapeutic level  8 hour heparin level Daily heparin level and CBC Follow up long term anticoagulation plan  Bailey MechEmily Stewart, PharmD PGY1 Pharmacy Resident Pager: [phone] 07/28/2016 10:01 AM

## 2016-07-28 NOTE — Progress Notes (Signed)
Slept +/- 6 hours during night. Coughing up thick yellow and white secretions. Continues to require deep suction regularly. O2 sats 92-100% with O2 6L via 28% TC. Continues to require bilateral wrist restraints and SR's up X 4 for safety. No change in urine color-dark amber. No BM's during night. Alfredo MartinezMurray, Cheri Ayotte A

## 2016-07-28 NOTE — Progress Notes (Addendum)
Subjective/Complaints: Patient up in chair, trach secretions are moderate, patient is asking for his daughter. Vocal volume appears improved compared to yesterday   ROS: Limited due to cognition  Objective: Vital Signs: Blood pressure (!) 124/51, pulse 61, temperature 97.8 F (36.6 C), temperature source Oral, resp. rate 16, height 5\' 9"  (1.753 m), weight 89 kg (196 lb 3.4 oz), SpO2 100 %. No results found. Results for orders placed or performed during the hospital encounter of 07/19/16 (from the past 72 hour(s))  Glucose, capillary     Status: None   Collection Time: 07/25/16 12:45 PM  Result Value Ref Range   Glucose-Capillary 96 65 - 99 mg/dL  Protime-INR     Status: None   Collection Time: 07/25/16  3:47 PM  Result Value Ref Range   Prothrombin Time 15.1 11.4 - 15.2 seconds   INR 1.19   Glucose, capillary     Status: Abnormal   Collection Time: 07/25/16  6:25 PM  Result Value Ref Range   Glucose-Capillary 107 (H) 65 - 99 mg/dL  Glucose, capillary     Status: None   Collection Time: 07/25/16  8:30 PM  Result Value Ref Range   Glucose-Capillary 81 65 - 99 mg/dL  Glucose, capillary     Status: None   Collection Time: 07/25/16 11:53 PM  Result Value Ref Range   Glucose-Capillary 99 65 - 99 mg/dL  Glucose, capillary     Status: None   Collection Time: 07/26/16  3:37 AM  Result Value Ref Range   Glucose-Capillary 90 65 - 99 mg/dL  Glucose, capillary     Status: None   Collection Time: 07/26/16  8:11 AM  Result Value Ref Range   Glucose-Capillary 85 65 - 99 mg/dL  Glucose, capillary     Status: None   Collection Time: 07/26/16 12:19 PM  Result Value Ref Range   Glucose-Capillary 86 65 - 99 mg/dL  CBC     Status: Abnormal   Collection Time: 07/26/16  1:03 PM  Result Value Ref Range   WBC 8.0 4.0 - 10.5 K/uL   RBC 3.63 (L) 4.22 - 5.81 MIL/uL   Hemoglobin 10.2 (L) 13.0 - 17.0 g/dL   HCT 16.132.0 (L) 09.639.0 - 04.552.0 %   MCV 88.2 78.0 - 100.0 fL   MCH 28.1 26.0 - 34.0 pg    MCHC 31.9 30.0 - 36.0 g/dL   RDW 40.917.3 (H) 81.111.5 - 91.415.5 %   Platelets 315 150 - 400 K/uL  Glucose, capillary     Status: None   Collection Time: 07/26/16  4:36 PM  Result Value Ref Range   Glucose-Capillary 70 65 - 99 mg/dL  Glucose, capillary     Status: None   Collection Time: 07/26/16  8:34 PM  Result Value Ref Range   Glucose-Capillary 82 65 - 99 mg/dL   Comment 1 Notify RN   Glucose, capillary     Status: None   Collection Time: 07/27/16  6:57 AM  Result Value Ref Range   Glucose-Capillary 84 65 - 99 mg/dL  Glucose, capillary     Status: None   Collection Time: 07/27/16 11:42 AM  Result Value Ref Range   Glucose-Capillary 98 65 - 99 mg/dL   Comment 1 Notify RN   Heparin level (unfractionated)     Status: None   Collection Time: 07/27/16  5:16 PM  Result Value Ref Range   Heparin Unfractionated 0.48 0.30 - 0.70 IU/mL    Comment:  IF HEPARIN RESULTS ARE BELOW EXPECTED VALUES, AND PATIENT DOSAGE HAS BEEN CONFIRMED, SUGGEST FOLLOW UP TESTING OF ANTITHROMBIN III LEVELS.   Glucose, capillary     Status: None   Collection Time: 07/27/16  5:21 PM  Result Value Ref Range   Glucose-Capillary 96 65 - 99 mg/dL   Comment 1 Notify RN   Glucose, capillary     Status: None   Collection Time: 07/27/16 10:13 PM  Result Value Ref Range   Glucose-Capillary 88 65 - 99 mg/dL  Glucose, capillary     Status: None   Collection Time: 07/28/16  6:56 AM  Result Value Ref Range   Glucose-Capillary 97 65 - 99 mg/dL  Heparin level (unfractionated)     Status: None   Collection Time: 07/28/16  7:08 AM  Result Value Ref Range   Heparin Unfractionated 0.70 0.30 - 0.70 IU/mL    Comment:        IF HEPARIN RESULTS ARE BELOW EXPECTED VALUES, AND PATIENT DOSAGE HAS BEEN CONFIRMED, SUGGEST FOLLOW UP TESTING OF ANTITHROMBIN III LEVELS.   CBC     Status: Abnormal   Collection Time: 07/28/16  7:08 AM  Result Value Ref Range   WBC 6.4 4.0 - 10.5 K/uL   RBC 3.47 (L) 4.22 - 5.81 MIL/uL    Hemoglobin 9.4 (L) 13.0 - 17.0 g/dL   HCT 30.8 (L) 65.7 - 84.6 %   MCV 88.5 78.0 - 100.0 fL   MCH 27.1 26.0 - 34.0 pg   MCHC 30.6 30.0 - 36.0 g/dL   RDW 96.2 (H) 95.2 - 84.1 %   Platelets 285 150 - 400 K/uL  Glucose, capillary     Status: Abnormal   Collection Time: 07/28/16  8:28 AM  Result Value Ref Range   Glucose-Capillary 100 (H) 65 - 99 mg/dL     HEENT: Normocephalic. Healing trauma Neck: #6 shiley, significant secretions, white thick, no bloody streaks Cardio: RRR. No JVD.  Resp: Ronchi and Unlabored GI: BS positive and ND, PEG site clean, dry and intact. Nontender to palpation. No drainage  Skin:   Other trach site without bleeding Neuro: Somnolent Sensory cannot assess due to somnolence , Motor: Unable to assess this AM due to somnolence  Musc/Skel:  No edema. No tenderness.  Gen NAD, Vital signs reviewed.  GU Foley bag chair. cherry colored urine  Assessment/Plan: 1. Functional deficits secondary to bilateral cortical and subcortical cardioembolic infarcts which require 3+ hours per day of interdisciplinary therapy in a comprehensive inpatient rehab setting. Physiatrist is providing close team supervision and 24 hour management of active medical problems listed below. Physiatrist and rehab team continue to assess barriers to discharge/monitor patient progress toward functional and medical goals. FIM: Function - Bathing Position: Sitting EOB Body parts bathed by patient: Right arm, Left arm, Chest, Right upper leg, Left upper leg, Right lower leg, Left lower leg, Abdomen Body parts bathed by helper: Front perineal area, Buttocks, Back Assist Level: 2 helpers  Function- Upper Body Dressing/Undressing What is the patient wearing?: Pull over shirt/dress Pull over shirt/dress - Perfomed by patient: Thread/unthread right sleeve, Thread/unthread left sleeve Pull over shirt/dress - Perfomed by helper: Thread/unthread right sleeve, Thread/unthread left sleeve, Put head through  opening, Pull shirt over trunk (therapist assist secondary to decreased time) Assist Level: 2 helpers Function - Lower Body Dressing/Undressing What is the patient wearing?: Non-skid slipper socks Position: Wheelchair/chair at sink Pants- Performed by patient: Thread/unthread right pants leg Pants- Performed by helper: Thread/unthread right pants leg, Thread/unthread  left pants leg, Pull pants up/down (Therapist assisted secondary to decreased time. ) Non-skid slipper socks- Performed by helper: Don/doff right sock, Don/doff left sock Socks - Performed by helper: Don/doff right sock, Don/doff left sock Assist for lower body dressing: 2 Helpers  Function - Toileting Toileting activity did not occur: No continent bowel/bladder event Toileting steps completed by patient: Adjust clothing after toileting Toileting steps completed by helper: Adjust clothing prior to toileting, Performs perineal hygiene, Adjust clothing after toileting Toileting Assistive Devices: Grab bar or rail Assist level: More than reasonable time, Two helpers  Function - Archivist transfer activity did not occur: Safety/medical concerns Toilet transfer assistive device: Elevated toilet seat/BSC over toilet, Mechanical lift Mechanical lift: Stedy Assist level to toilet: 2 helpers Assist level from toilet: 2 helpers Assist level to bedside commode (at bedside): 2 helpers Assist level from bedside commode (at bedside): 2 helpers  Function - Chair/bed transfer Chair/bed transfer method: Stand pivot Chair/bed transfer assist level: Maximal assist (Pt 25 - 49%/lift and lower) Chair/bed transfer assistive device: Walker, Armrests Mechanical lift: Stedy Chair/bed transfer details: Verbal cues for technique, Tactile cues for posture, Verbal cues for sequencing, Manual facilitation for weight shifting, Manual facilitation for placement  Function - Locomotion: Wheelchair Will patient use wheelchair at  discharge?: Yes Type: Manual Wheelchair activity did not occur: Safety/medical concerns Max wheelchair distance: 138ft  Assist Level: Moderate assistance (Pt 50 - 74%) Wheel 50 feet with 2 turns activity did not occur: Safety/medical concerns Assist Level: Moderate assistance (Pt 50 - 74%) Wheel 150 feet activity did not occur: Safety/medical concerns Turns around,maneuvers to table,bed, and toilet,negotiates 3% grade,maneuvers on rugs and over doorsills: No Function - Locomotion: Ambulation Ambulation activity did not occur: Safety/medical concerns Assistive device: Walker-rolling Max distance: 11ft  Assist level: Moderate assist (Pt 50 - 74%) Walk 10 feet activity did not occur: Safety/medical concerns Assist level: Moderate assist (Pt 50 - 74%) Walk 50 feet with 2 turns activity did not occur: Safety/medical concerns Walk 150 feet activity did not occur: Safety/medical concerns Walk 10 feet on uneven surfaces activity did not occur: Safety/medical concerns  Function - Comprehension Comprehension: Auditory Comprehension assist level: Understands basic 25 - 49% of the time/ requires cueing 50 - 75% of the time  Function - Expression Expression: Verbal Expression assistive device: Talk trach valve Expression assist level: Expresses basic 25 - 49% of the time/requires cueing 50 - 75% of the time. Uses single words/gestures.  Function - Social Interaction Social Interaction assist level: Interacts appropriately 25 - 49% of time - Needs frequent redirection.  Function - Problem Solving Problem solving assist level: Solves basic 25 - 49% of the time - needs direction more than half the time to initiate, plan or complete simple activities  Function - Memory Memory assist level: Recognizes or recalls less than 25% of the time/requires cueing greater than 75% of the time Patient normally able to recall (first 3 days only): None of the above  Medical Problem List and Plan: 1.  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 DVT Prophylaxis/Anticoagulation: Pharmaceutical: Other (comment)--Eliquis d/ced due to increase in bloody secretions, on IV heparin but will need to stop due to hematuria, will monitor hemoglobin 3. Pain Management: tylenol prn pain. Question muscle spasms--will monitor 4. Mood: LCSW to follow for evaluation as mentation improves.  5. Neuropsych: This  patient is notcapable of making decisions on hisown behalf. 6. Skin/Wound Care: routine pressure relief measures.  7. Fluids/Electrolytes/Nutrition: Monitor I/O and adjust fluid boluses as needed. Continue tube feeds. . 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 Tolerated PMV on #6 trach  Reduced secretions, sats have remained normal, , cont scopolamine patch, no evidence of sedation., 9. Parkinson's disease: Continue Sinemet 37.5/150 qid.  10. Leucocytosis: Resolving. Monitor for signs of infection.  11. ABLA: Continue to monitor--has been fluctuating from 9.6-->10.4, 9.3 on 3/2, stool guaic remains pending, repeat 10.2 on 3/8, may reflect some hemoconcentration, 07/28/2016, hemoglobin 9.4, which is more in line with his baseline.   12. Hematuria: We will stop heparin. Monitor hemoglobin  13. Severe dysphagia: NPO till mentation improv  tolerating PEG feedings   LOS (Days) 9 A FACE TO FACE EVALUATION WAS PERFORMED  Marely Apgar E 07/28/2016, 11:14 AM

## 2016-07-29 ENCOUNTER — Inpatient Hospital Stay (HOSPITAL_COMMUNITY): Payer: Medicare Other | Admitting: Occupational Therapy

## 2016-07-29 LAB — CBC
HEMATOCRIT: 29.2 % — AB (ref 39.0–52.0)
Hemoglobin: 9 g/dL — ABNORMAL LOW (ref 13.0–17.0)
MCH: 27.3 pg (ref 26.0–34.0)
MCHC: 30.8 g/dL (ref 30.0–36.0)
MCV: 88.5 fL (ref 78.0–100.0)
Platelets: 289 10*3/uL (ref 150–400)
RBC: 3.3 MIL/uL — ABNORMAL LOW (ref 4.22–5.81)
RDW: 16.8 % — AB (ref 11.5–15.5)
WBC: 6.8 10*3/uL (ref 4.0–10.5)

## 2016-07-29 LAB — GLUCOSE, CAPILLARY
GLUCOSE-CAPILLARY: 112 mg/dL — AB (ref 65–99)
Glucose-Capillary: 106 mg/dL — ABNORMAL HIGH (ref 65–99)
Glucose-Capillary: 102 mg/dL — ABNORMAL HIGH (ref 65–99)
Glucose-Capillary: 120 mg/dL — ABNORMAL HIGH (ref 65–99)

## 2016-07-29 MED ORDER — ASPIRIN 81 MG PO CHEW
81.0000 mg | CHEWABLE_TABLET | Freq: Every day | ORAL | Status: DC
  Administered 2016-07-29 – 2016-08-17 (×20): 81 mg
  Filled 2016-07-29 (×21): qty 1

## 2016-07-29 NOTE — Progress Notes (Signed)
At 0030, blood tinged, thick sputum noted. Pharmacist made aware. Hematuria continues. Requiring deep suctioning every 3-4 hours. Patient mentioned about pulling peg tube. Bilateral wrist restraints necessary to prevent pulling tubes/lines. PRN trazodone given at HS. Alfredo MartinezMurray, Boss Danielsen A

## 2016-07-29 NOTE — Plan of Care (Signed)
Problem: RH BLADDER ELIMINATION Goal: RH STG MANAGE BLADDER WITH EQUIPMENT WITH ASSISTANCE STG Manage Bladder With Equipment With mod Assistance  Outcome: Not Progressing Total assist with Foley

## 2016-07-29 NOTE — Progress Notes (Signed)
Subjective/Complaints: Discussed with nursing. No further blood tinged sputum. Had deep suctioning this morning. Urine remains light red colored Still requiring restraints, patient threatening to pull PEG tube  ROS: Limited due to cognition  Objective: Vital Signs: Blood pressure (!) 126/53, pulse 71, temperature 98.5 F (36.9 C), temperature source Oral, resp. rate 18, height 5\' 9"  (1.753 m), weight 83 kg (182 lb 15.7 oz), SpO2 100 %. No results found. Results for orders placed or performed during the hospital encounter of 07/19/16 (from the past 72 hour(s))  Glucose, capillary     Status: None   Collection Time: 07/26/16 12:19 PM  Result Value Ref Range   Glucose-Capillary 86 65 - 99 mg/dL  CBC     Status: Abnormal   Collection Time: 07/26/16  1:03 PM  Result Value Ref Range   WBC 8.0 4.0 - 10.5 K/uL   RBC 3.63 (L) 4.22 - 5.81 MIL/uL   Hemoglobin 10.2 (L) 13.0 - 17.0 g/dL   HCT 40.932.0 (L) 81.139.0 - 91.452.0 %   MCV 88.2 78.0 - 100.0 fL   MCH 28.1 26.0 - 34.0 pg   MCHC 31.9 30.0 - 36.0 g/dL   RDW 78.217.3 (H) 95.611.5 - 21.315.5 %   Platelets 315 150 - 400 K/uL  Glucose, capillary     Status: None   Collection Time: 07/26/16  4:36 PM  Result Value Ref Range   Glucose-Capillary 70 65 - 99 mg/dL  Glucose, capillary     Status: None   Collection Time: 07/26/16  8:34 PM  Result Value Ref Range   Glucose-Capillary 82 65 - 99 mg/dL   Comment 1 Notify RN   Glucose, capillary     Status: None   Collection Time: 07/27/16  6:57 AM  Result Value Ref Range   Glucose-Capillary 84 65 - 99 mg/dL  Glucose, capillary     Status: None   Collection Time: 07/27/16 11:42 AM  Result Value Ref Range   Glucose-Capillary 98 65 - 99 mg/dL   Comment 1 Notify RN   Heparin level (unfractionated)     Status: None   Collection Time: 07/27/16  5:16 PM  Result Value Ref Range   Heparin Unfractionated 0.48 0.30 - 0.70 IU/mL    Comment:        IF HEPARIN RESULTS ARE BELOW EXPECTED VALUES, AND PATIENT DOSAGE HAS BEEN  CONFIRMED, SUGGEST FOLLOW UP TESTING OF ANTITHROMBIN III LEVELS.   Glucose, capillary     Status: None   Collection Time: 07/27/16  5:21 PM  Result Value Ref Range   Glucose-Capillary 96 65 - 99 mg/dL   Comment 1 Notify RN   Glucose, capillary     Status: None   Collection Time: 07/27/16 10:13 PM  Result Value Ref Range   Glucose-Capillary 88 65 - 99 mg/dL  Glucose, capillary     Status: None   Collection Time: 07/28/16  6:56 AM  Result Value Ref Range   Glucose-Capillary 97 65 - 99 mg/dL  Heparin level (unfractionated)     Status: None   Collection Time: 07/28/16  7:08 AM  Result Value Ref Range   Heparin Unfractionated 0.70 0.30 - 0.70 IU/mL    Comment:        IF HEPARIN RESULTS ARE BELOW EXPECTED VALUES, AND PATIENT DOSAGE HAS BEEN CONFIRMED, SUGGEST FOLLOW UP TESTING OF ANTITHROMBIN III LEVELS.   CBC     Status: Abnormal   Collection Time: 07/28/16  7:08 AM  Result Value Ref Range   WBC  6.4 4.0 - 10.5 K/uL   RBC 3.47 (L) 4.22 - 5.81 MIL/uL   Hemoglobin 9.4 (L) 13.0 - 17.0 g/dL   HCT 16.1 (L) 09.6 - 04.5 %   MCV 88.5 78.0 - 100.0 fL   MCH 27.1 26.0 - 34.0 pg   MCHC 30.6 30.0 - 36.0 g/dL   RDW 40.9 (H) 81.1 - 91.4 %   Platelets 285 150 - 400 K/uL  Glucose, capillary     Status: Abnormal   Collection Time: 07/28/16  8:28 AM  Result Value Ref Range   Glucose-Capillary 100 (H) 65 - 99 mg/dL  Glucose, capillary     Status: Abnormal   Collection Time: 07/28/16 12:18 PM  Result Value Ref Range   Glucose-Capillary 111 (H) 65 - 99 mg/dL  Glucose, capillary     Status: None   Collection Time: 07/28/16  4:55 PM  Result Value Ref Range   Glucose-Capillary 94 65 - 99 mg/dL  Glucose, capillary     Status: Abnormal   Collection Time: 07/28/16  9:53 PM  Result Value Ref Range   Glucose-Capillary 115 (H) 65 - 99 mg/dL  CBC     Status: Abnormal   Collection Time: 07/29/16  4:27 AM  Result Value Ref Range   WBC 6.8 4.0 - 10.5 K/uL   RBC 3.30 (L) 4.22 - 5.81 MIL/uL    Hemoglobin 9.0 (L) 13.0 - 17.0 g/dL   HCT 78.2 (L) 95.6 - 21.3 %   MCV 88.5 78.0 - 100.0 fL   MCH 27.3 26.0 - 34.0 pg   MCHC 30.8 30.0 - 36.0 g/dL   RDW 08.6 (H) 57.8 - 46.9 %   Platelets 289 150 - 400 K/uL  Glucose, capillary     Status: Abnormal   Collection Time: 07/29/16  7:35 AM  Result Value Ref Range   Glucose-Capillary 106 (H) 65 - 99 mg/dL     HEENT: Normocephalic. Healing trauma Neck: #6 shiley, significant secretions, white thick, no bloody streaks Cardio: RRR. No JVD.  Resp: Ronchi and Unlabored GI: BS positive and ND, PEG site clean, dry and intact. Nontender to palpation. No drainage  Skin:   Other trach site without bleeding Neuro: Somnolent Sensory cannot assess due to somnolence , Motor: Unable to assess this AM due to somnolence  Musc/Skel:  No edema. No tenderness.  Gen NAD, Vital signs reviewed.  GU Foley bag chair. cherry colored urine  Assessment/Plan: 1. Functional deficits secondary to bilateral cortical and subcortical cardioembolic infarcts which require 3+ hours per day of interdisciplinary therapy in a comprehensive inpatient rehab setting. Physiatrist is providing close team supervision and 24 hour management of active medical problems listed below. Physiatrist and rehab team continue to assess barriers to discharge/monitor patient progress toward functional and medical goals. FIM: Function - Bathing Position: Sitting EOB Body parts bathed by patient: Right arm, Left arm, Chest, Right upper leg, Left upper leg, Right lower leg, Left lower leg, Abdomen, Front perineal area Body parts bathed by helper: Buttocks, Back Assist Level: Touching or steadying assistance(Pt > 75%)  Function- Upper Body Dressing/Undressing What is the patient wearing?: Pull over shirt/dress Pull over shirt/dress - Perfomed by patient: Thread/unthread right sleeve, Thread/unthread left sleeve Pull over shirt/dress - Perfomed by helper: Put head through opening, Pull shirt over  trunk Assist Level: 2 helpers Function - Lower Body Dressing/Undressing What is the patient wearing?: Pants, Non-skid slipper socks Position: Sitting EOB Pants- Performed by patient: Thread/unthread right pants leg Pants- Performed by helper: Thread/unthread  right pants leg, Thread/unthread left pants leg, Pull pants up/down Non-skid slipper socks- Performed by patient: Don/doff right sock, Don/doff left sock Non-skid slipper socks- Performed by helper: Don/doff right sock, Don/doff left sock Socks - Performed by helper: Don/doff right sock, Don/doff left sock Assist for lower body dressing:  (Max A)  Function - Toileting Toileting activity did not occur: No continent bowel/bladder event Toileting steps completed by patient: Adjust clothing after toileting Toileting steps completed by helper: Adjust clothing prior to toileting, Performs perineal hygiene, Adjust clothing after toileting Toileting Assistive Devices: Grab bar or rail Assist level: More than reasonable time, Two helpers  Function - Archivist transfer activity did not occur: Safety/medical concerns Toilet transfer assistive device: Elevated toilet seat/BSC over toilet, Mechanical lift Mechanical lift: Stedy Assist level to toilet: 2 helpers Assist level from toilet: 2 helpers Assist level to bedside commode (at bedside): 2 helpers Assist level from bedside commode (at bedside): 2 helpers  Function - Chair/bed transfer Chair/bed transfer method: Stand pivot Chair/bed transfer assist level: Moderate assist (Pt 50 - 74%/lift or lower) Chair/bed transfer assistive device: Armrests, Walker Mechanical lift: Stedy Chair/bed transfer details: Verbal cues for technique, Tactile cues for posture, Verbal cues for sequencing, Manual facilitation for weight shifting, Manual facilitation for placement  Function - Locomotion: Wheelchair Will patient use wheelchair at discharge?: Yes Type: Manual Wheelchair activity  did not occur: Safety/medical concerns Max wheelchair distance: 125 Assist Level: Moderate assistance (Pt 50 - 74%) Wheel 50 feet with 2 turns activity did not occur: Safety/medical concerns Assist Level: Moderate assistance (Pt 50 - 74%) Wheel 150 feet activity did not occur: Safety/medical concerns Turns around,maneuvers to table,bed, and toilet,negotiates 3% grade,maneuvers on rugs and over doorsills: No Function - Locomotion: Ambulation Ambulation activity did not occur: Safety/medical concerns Assistive device: Walker-rolling Max distance: 62ft  Assist level: Moderate assist (Pt 50 - 74%) Walk 10 feet activity did not occur: Safety/medical concerns Assist level: Moderate assist (Pt 50 - 74%) Walk 50 feet with 2 turns activity did not occur: Safety/medical concerns Walk 150 feet activity did not occur: Safety/medical concerns Walk 10 feet on uneven surfaces activity did not occur: Safety/medical concerns  Function - Comprehension Comprehension: Auditory Comprehension assist level: Understands basic 25 - 49% of the time/ requires cueing 50 - 75% of the time  Function - Expression Expression: Verbal Expression assistive device: Talk trach valve Expression assist level: Expresses basic 25 - 49% of the time/requires cueing 50 - 75% of the time. Uses single words/gestures.  Function - Social Interaction Social Interaction assist level: Interacts appropriately 25 - 49% of time - Needs frequent redirection.  Function - Problem Solving Problem solving assist level: Solves basic 25 - 49% of the time - needs direction more than half the time to initiate, plan or complete simple activities  Function - Memory Memory assist level: Recognizes or recalls less than 25% of the time/requires cueing greater than 75% of the time Patient normally able to recall (first 3 days only): None of the above  Medical Problem List and Plan: 1. Cognitive deficits, severe dysphagia and gait disorder  secondary to bilateral cardioembolic infarcts   Cont CIR PT, OT, SLP efforts Discussed issues with the patient's son who is visiting today May benefit from resp muscle strengthening prior to aggressive swallow retraining  2. DVT Prophylaxis/Anticoagulation: Pharmaceutical: Other (comment)--Eliquis d/ced due to increase in bloody secretions, off IV heparin, continue baby aspirin 3. Pain Management: tylenol prn pain. Question muscle spasms--will monitor 4. Mood: LCSW  to follow for evaluation as mentation improves.  5. Neuropsych: This patient is notcapable of making decisions on hisown behalf. 6. Skin/Wound Care: routine pressure relief measures.  7. Fluids/Electrolytes/Nutrition: Monitor I/O and adjust fluid boluses as needed. Continue tube feeds. . 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 Tolerated PMV on #6 trach  , cont scopolamine patch, no evidence of sedation secretions still fairly heavy., 9. Parkinson's disease: Continue Sinemet 37.5/150 qid.  10. Leucocytosis: Resolving. Monitor for signs of infection.  11. ABLA: Continue to monitor-no acute drops CBC Latest Ref Rng & Units 07/29/2016 07/28/2016 07/26/2016  WBC 4.0 - 10.5 K/uL 6.8 6.4 8.0  Hemoglobin 13.0 - 17.0 g/dL 9.0(L) 9.4(L) 10.2(L)  Hematocrit 39.0 - 52.0 % 29.2(L) 30.7(L) 32.0(L)  Platelets 150 - 400 K/uL 289 285 315     12. Hematuria: We will stop heparin. Monitor hemoglobin, No significant changes. Thus far 13. Severe dysphagia: NPO till mentation improv  tolerating PEG feedings   LOS (Days) 10 A FACE TO FACE EVALUATION WAS PERFORMED  Brave Dack E 07/29/2016, 10:03 AM

## 2016-07-29 NOTE — Progress Notes (Signed)
Occupational Therapy Session Note  Patient Details  Name: Gregory [name] MRN: [id number] Date of Birth: 08-26-1940  Today's Date: 07/29/2016 OT Individual Time: 0900-1000 OT Individual Time Calculation (min): 60 min    Short Term Goals: Week 2:  OT Short Term Goal 1 (Week 2): Pt will complete UB dressing sitting unsupported with supervision for 2 consecutive sessions.  OT Short Term Goal 2 (Week 2): pt will complete LB bathing sit to stand with min assist 2 consecutive sessions.  OT Short Term Goal 3 (Week 2): Pt will complete LB dressing sit to stand with mod assist for 2 consecutive sessions.  OT Short Term Goal 4 (Week 2): Pt will complete stand pivot [facility] with mod assist for toileting tasks.  OT Short Term Goal 5 (Week 2): Pt will maintain standing with min guard assist while completing 2-3 grooming tasks.    Skilled Therapeutic Interventions/Progress Updates:  1:1  SElf care retraining at sink level with focus on sit to stands, stand pivot transfers, standing balance, functional problem solving, sequencing and recall. Pt came to  EOB with mod a and performed stand pivot to standard w/c with mod A without AD- pt requires mod to max multimodal cues to transition weight from posterior to anteriorly to maintain balance. Pt bathed at sink with set up and min to mod instructional cues for sequencing. Continued focus on sit to stand at sink with mirror for visual feedback. Pt positioned far back from the sink to promote a forward weight shift with sit to stand with one hand on the sink and the other one pushing up from the arm rest of w/c. Once pt able to achieve standing at midline pt able to statically stand without UE support Pt able to thread pants with feet crossed over knee and extra time. Pt required more than reasonable amt of time to don shirt but was able to perform all 4 parts; perform sit to stand with min A to assist with pulling shirt down over trunk. Oral care  performed with suction with total assist. Son chris present for session.  Pt with strong posterior lean with attempt for stand pivot back into tilt in space w/c - transitioned to attempting transfer with RW and able to complete it with mod A. Left with quick release on with son present in room     Therapy Documentation Precautions:  Precautions Precautions: Fall Precaution Comments: B wrist restraints Restrictions Weight Bearing Restrictions: No Pain:  no c/o pain in session   See Function Navigator for Current Functional Status.   Therapy/Group: Individual Therapy  Gregory [name], Gregory [name] 07/29/2016, 12:33 PM

## 2016-07-30 ENCOUNTER — Inpatient Hospital Stay (HOSPITAL_COMMUNITY): Payer: Medicare Other | Admitting: Speech Pathology

## 2016-07-30 ENCOUNTER — Inpatient Hospital Stay (HOSPITAL_COMMUNITY): Payer: Medicare Other | Admitting: Occupational Therapy

## 2016-07-30 ENCOUNTER — Inpatient Hospital Stay (HOSPITAL_COMMUNITY): Payer: Medicare Other | Admitting: *Deleted

## 2016-07-30 LAB — GLUCOSE, CAPILLARY
GLUCOSE-CAPILLARY: 99 mg/dL (ref 65–99)
GLUCOSE-CAPILLARY: 103 mg/dL — AB (ref 65–99)
GLUCOSE-CAPILLARY: 100 mg/dL — AB (ref 65–99)
Glucose-Capillary: 97 mg/dL (ref 65–99)

## 2016-07-30 LAB — BASIC METABOLIC PANEL
ANION GAP: 7 (ref 5–15)
BUN: 14 mg/dL (ref 6–20)
CHLORIDE: 99 mmol/L — AB (ref 101–111)
CO2: 32 mmol/L (ref 22–32)
CREATININE: 0.75 mg/dL (ref 0.61–1.24)
Calcium: 9.1 mg/dL (ref 8.9–10.3)
GFR calc non Af Amer: >60 mL/min (ref >60)
Glucose, Bld: 101 mg/dL — ABNORMAL HIGH (ref 65–99)
Potassium: 3.8 mmol/L (ref 3.5–5.1)
Sodium: 138 mmol/L (ref 135–145)

## 2016-07-30 LAB — CBC
HCT: 29.6 % — ABNORMAL LOW (ref 39.0–52.0)
HEMOGLOBIN: 8.9 g/dL — AB (ref 13.0–17.0)
MCH: 26.8 pg (ref 26.0–34.0)
MCHC: 30.1 g/dL (ref 30.0–36.0)
MCV: 89.2 fL (ref 78.0–100.0)
PLATELETS: 285 10*3/uL (ref 150–400)
RBC: 3.32 MIL/uL — AB (ref 4.22–5.81)
RDW: 16.7 % — ABNORMAL HIGH (ref 11.5–15.5)
WBC: 6.4 10*3/uL (ref 4.0–10.5)

## 2016-07-30 LAB — HEMOGLOBIN A1C
Hgb A1c MFr Bld: <4.2 % — ABNORMAL LOW (ref 4.8–5.6)
Mean Plasma Glucose: <74 mg/dL

## 2016-07-30 MED ORDER — JEVITY 1.2 CAL PO LIQD
ORAL | Status: AC
  Filled 2016-07-30: qty 237

## 2016-07-30 NOTE — Progress Notes (Signed)
Occupational Therapy Session Note  Patient Details  Name: Gregory LoronRichard Ciocca MRN: [id number] Date of Birth: 05-Jun-1940  Today's Date: 07/30/2016 OT Individual Time: [id number] OT Individual Time Calculation (min): 32 min    Short Term Goals: Week 2:  OT Short Term Goal 1 (Week 2): Pt will complete UB dressing sitting unsupported with supervision for 2 consecutive sessions.  OT Short Term Goal 2 (Week 2): pt will complete LB bathing sit to stand with min assist 2 consecutive sessions.  OT Short Term Goal 3 (Week 2): Pt will complete LB dressing sit to stand with mod assist for 2 consecutive sessions.  OT Short Term Goal 4 (Week 2): Pt will complete stand pivot transfers [facility] with mod assist for toileting tasks.  OT Short Term Goal 5 (Week 2): Pt will maintain standing with min guard assist while completing 2-3 grooming tasks.    Skilled Therapeutic Interventions/Progress Updates:    Pt seen for OT session sitting on the EOB.  Completed sit to stand with mod assist in order to apply barrier cream secondary to not putting it on this am and pt just finishing toileting.  Next focused session on lateral weight shifts for reciprical scooting as well as bilateral scooting.  Pt demonstrates decreased ability to scoot to the edge of the bed with either method.  Completed mobilizations of lateral weightshifts side to side with max assist.  Pt with limited mobility in his pelvis and rib cage.  Worked on having him reach to target as well for activation of lateral weightshift to both the left and right.  Decreased ability to complete functional reaching secondary to bilateral shoulder weakness and decreased lower trunk mobility.  Max assist needed for reciprical scooting on the right and left secondary to not being able to motor plan use of his UE on the side he is attempting to advance or complete weightshift off of it.  For bilateral scooting he is able to complete some forward trunk flexion but cannot  relax his knees in order to allow for femurs and hip to move forward.  Needs mod facilitation for bilateral scooting forward.  He is able to complete scooting backwards bilaterally with supervision however.  Mod assist for sit to stand in order to take steps up toward the top of the bed.  Increased posterior lean noted in standing with decreased hip extension.  Pt transferred to supine with mod assist after removing the right shoe with supervision and the left with min assist.  Pt left with daughter [name] with PMV in place, as daughter stated SLP checked her off to put it on and take it off as long as he is not sleeping.  Wrist restraints also not applied as daughter is sitting bedside with him and requested to leave them off and if she leaves, then she will let nursing know in order to place them back on.  Nursing also made aware of the PMV and the wrist restraints as well.    Therapy Documentation Precautions:  Precautions Precautions: Fall Precaution Comments: B wrist restraints Restrictions Weight Bearing Restrictions: No  Vital Signs: Therapy Vitals Temp: 98.2 F (36.8 C) Temp Source: Oral Pulse Rate: (!) 59 Patient Position (if appropriate): Lying Oxygen Therapy SpO2: 98 % O2 Device: Tracheostomy Collar O2 Flow Rate (L/min): 3 L/min FiO2 (%): 23 % Pain: Pain Assessment Pain Assessment: No/denies pain ADL: See Function Navigator for Current Functional Status.   Therapy/Group: Individual Therapy  Carling Liberman OTR/L 07/30/2016, 3:55 PM

## 2016-07-30 NOTE — Progress Notes (Signed)
Speech Language Pathology Daily Session Note  Patient Details  Name: Gregory [name] MRN: [id number] Date of Birth: 1941/04/06  Today's Date: 07/30/2016 SLP Individual Time: 1130-1200 SLP Individual Time Calculation (min): 30 min  Short Term Goals: Week 2: SLP Short Term Goal 1 (Week 2): Patient will consume trials of nectar-thick liquids via tsp with minimal overt s/s of aspiration to assess readiness for repeat objective swallow assessment.  SLP Short Term Goal 2 (Week 2): Patient will increase speech intelligibility to ~75% at the phrase level with Mod A multimodal cues for use of speech intelligibility strategies.  SLP Short Term Goal 3 (Week 2): Pt will demonstrate sustained attention to a functional task for ~ 2 minutes with Mod A verbal cues. for redirection to task.  SLP Short Term Goal 4 (Week 2): Pt will tolerate PMSV for during all waking hours with full supervision without decline in vitals.  SLP Short Term Goal 5 (Week 2): Pt will complete basic familiar tasks (related to ADLs, brushing hair, oral care) with Mod A verbal cues.   Skilled Therapeutic Interventions: Skilled treatment session focused cognition and dysphagia goals. SLP facilitated session by providing Mod A assist for oral care. Pt required Mod A verbal cues for sustained attention to tasks for % minute intervals. Education provided to daughter to redirect pt to task at hand instead of answering pt's random comments/questions. Pt required Max a multimodal cues for producing strong cough. Pt unable to produce strong cough but was able to produce more intense throat clear. Pt with no intellectual awareness to deficits. Pt was given 4 oz of nectar thick liquids via spoon with what appeared to be a more timely swallow and no overt s/s of aspiration. Much improvement over previous trials. Pt's voice remained clear throughout session as well. Pt was left with PMSV in place with daughter present. Continue per current plan of care.       Function:  Eating Eating   Modified Consistency Diet: Yes             Cognition Comprehension Comprehension assist level: Understands basic 25 - 49% of the time/ requires cueing 50 - 75% of the time  Expression Expression assistive device: Talk trach valve Expression assist level: Expresses basic 25 - 49% of the time/requires cueing 50 - 75% of the time. Uses single words/gestures.  Social Interaction Social Interaction assist level: Interacts appropriately 25 - 49% of time - Needs frequent redirection.  Problem Solving Problem solving assist level: Solves basic 25 - 49% of the time - needs direction more than half the time to initiate, plan or complete simple activities  Memory Memory assist level: Recognizes or recalls less than 25% of the time/requires cueing greater than 75% of the time    Pain Pain Assessment Pain Assessment: No/denies pain  Therapy/Group: Individual Therapy   Jerrad Mendibles B. Dreama Saaverton, M.S., CCC-SLP Speech-Language Pathologist   Kerigan Narvaez 07/30/2016, 12:06 PM

## 2016-07-30 NOTE — Plan of Care (Signed)
Problem: RH BOWEL ELIMINATION Goal: RH STG MANAGE BOWEL W/MEDICATION W/ASSISTANCE STG Manage Bowel with Medication with mod Assistance.   Outcome: Not Progressing Incontinent requiring total assistance

## 2016-07-30 NOTE — Progress Notes (Signed)
Occupational Therapy Session Note  Patient Details  Name: Gregory LoronRichard Riggin MRN: [id number] Date of Birth: 06-15-1940  Today's Date: 07/30/2016 OT Individual Time: [id number] OT Individual Time Calculation (min): 62 min    Short Term Goals: Week 2:  OT Short Term Goal 1 (Week 2): Pt will complete UB dressing sitting unsupported with supervision for 2 consecutive sessions.  OT Short Term Goal 2 (Week 2): pt will complete LB bathing sit to stand with min assist 2 consecutive sessions.  OT Short Term Goal 3 (Week 2): Pt will complete LB dressing sit to stand with mod assist for 2 consecutive sessions.  OT Short Term Goal 4 (Week 2): Pt will complete stand pivot transfers [facility] with mod assist for toileting tasks.  OT Short Term Goal 5 (Week 2): Pt will maintain standing with min guard assist while completing 2-3 grooming tasks.    Skilled Therapeutic Interventions/Progress Updates:    Pt transferred to the EOB for bathing and dressing tasks.  He was able to maintain sitting balance for UB bathing and dressing with supervision as well as min assist for LB bathing and dressing secondary to posterior lean when crossing LEs.  Min instructional cueing to recall the need to wash his LUE this session as he did not remember.  He was tending to perseverate on washing his neck, which required min assist secondary to decreased bilateral shoulder AROM and strength.  Mod assist for all sit to stand transitions, with decreased forward weightshift and decreased timing of hip and knee extension.  Increased posterior lean in standing with mod assist to maintain balance while washing peri area and pulling brief and pants over hips.  Still with difficulty with crossing LEs for donning socks and shoes, with min assist for socks and max assist for shoes.  Max assist for stand pivot to the wheelchair this session secondary to posterior lean.  Pt aware of situation this am as well as location, but still not oriented to  day of the week or month.  He thought it was may.  Pt left in wheelchair with therapy tech in for next session.    Therapy Documentation Precautions:  Precautions Precautions: Fall Precaution Comments: B wrist restraints Restrictions Weight Bearing Restrictions: No  Vital Signs: Therapy Vitals Pulse Rate: 69 Resp: 18 Patient Position (if appropriate): Sitting Oxygen Therapy SpO2: 98 % O2 Device: Tracheostomy Collar O2 Flow Rate (L/min): 3 L/min FiO2 (%): 28 % Pulse Oximetry Type: Continuous Oximetry Probe Site Changed: No Pain: Pain Assessment Pain Assessment: No/denies pain ADL: See Function Navigator for Current Functional Status.   Therapy/Group: Individual Therapy  Azriella Mattia OTR/L 07/30/2016, 12:32 PM

## 2016-07-30 NOTE — Progress Notes (Signed)
Alert and joking with staff. Deep suctioned x 2 thus far. Coughing up thick cream colored sputum. No blood tinged sputum noted. Urine tea colored. Trazodone given at 2323 for sleep. Continues to require bilateral wrist restraints and side rails up X 4 for patient's safety. Gregory [name], Gregory [name]

## 2016-07-30 NOTE — Progress Notes (Signed)
Physical Therapy Session Note  Patient Details  Name: Emelia LoronRichard Selsor MRN: [id number] Date of Birth: 1940-08-28  Today's Date: 07/30/2016 PT Individual Time: 1005-1105 PT Individual Time Calculation (min): 60 min   Short Term Goals: Week 2:  PT Short Term Goal 1 (Week 2): Pt will performed WC mobility x 1420ft with supervision assist from PT PT Short Term Goal 2 (Week 2): Pt will ascend 4 steps(3") with mod assist  PT Short Term Goal 3 (Week 2): Pt will perform bed mobility with min assist  PT Short Term Goal 4 (Week 2): pt will ambulate 5250ft with mod assist  PT Short Term Goal 5 (Week 2): pt will perform stand pivot transfer with consistent mod assist   Skilled Therapeutic Interventions/Progress Updates:  Tx focused on transfer training, balance training, gait with RW, and activity tolerance. Pt up in TIS WC, no c/o pain.   Squat pivot transfers x4 throughout tx with focus on sequence of scooting to edge of chair (avoiding shear), and far anterior lean Bobath method to achieve translation over BOS for squat-pivot transfer. Pt needed up to Mod A. Pt attempted stand-step/pivot transfer despite cues and needed max A for safely complete.   WC propulsion x150' with close S using 4 extremities.   Pt instructed in reciprocal scooting and lateral scooting along edge of mat for increased pelvic and trunk dissociation and improved mobility for transitional movements. Mod A overall.   [facility]<>[facility] with Mod lifting assist x4. Pt stood at RW x6 min for pipe tree activity, which he was able to complete goal post in 3.5 min with min cues for sequence. Min A for balance.  Seated HS and heel cord stretching x1 min each during rest and recovery for elevated HR during exertion.   Gait with RW x45' with Min A overall and verbal timing cues ("Ants go marching" song) and for step length.   Pt left back in TIS for pressure relief and all needs in reach.       Therapy Documentation Precautions:   Precautions Precautions: Fall Precaution Comments: B wrist restraints Restrictions Weight Bearing Restrictions: No General:   Vital Signs: Therapy Vitals Pulse Rate: 69 Resp: 18 Patient Position (if appropriate): Sitting Oxygen Therapy SpO2: 98 % O2 Device: Tracheostomy Collar O2 Flow Rate (L/min): 3 L/min FiO2 (%): 28 % Pulse Oximetry Type: Continuous Oximetry Probe Site Changed: No Pain: Pain Assessment Pain Assessment: No/denies pain   See Function Navigator for Current Functional Status.   Therapy/Group: Individual Therapy  Genavive Kubicki Virl CageyM  Tramell Piechota, PT, DPT  07/30/2016, 12:53 PM

## 2016-07-30 NOTE — Progress Notes (Signed)
Subjective/Complaints: No further blood tinged sputum, no hematuria evident in foley bag  ROS: Limited due to cognition  Objective: Vital Signs: Blood pressure (!) 127/59, pulse 81, temperature 97.9 F (36.6 C), temperature source Oral, resp. rate 18, height '5\' 9"'  (1.753 m), weight 78.5 kg (173 lb), SpO2 100 %. No results found. Results for orders placed or performed during the hospital encounter of 07/19/16 (from the past 72 hour(s))  Glucose, capillary     Status: None   Collection Time: 07/27/16  6:57 AM  Result Value Ref Range   Glucose-Capillary 84 65 - 99 mg/dL  Glucose, capillary     Status: None   Collection Time: 07/27/16 11:42 AM  Result Value Ref Range   Glucose-Capillary 98 65 - 99 mg/dL   Comment 1 Notify RN   Heparin level (unfractionated)     Status: None   Collection Time: 07/27/16  5:16 PM  Result Value Ref Range   Heparin Unfractionated 0.48 0.30 - 0.70 IU/mL    Comment:        IF HEPARIN RESULTS ARE BELOW EXPECTED VALUES, AND PATIENT DOSAGE HAS BEEN CONFIRMED, SUGGEST FOLLOW UP TESTING OF ANTITHROMBIN III LEVELS.   Glucose, capillary     Status: None   Collection Time: 07/27/16  5:21 PM  Result Value Ref Range   Glucose-Capillary 96 65 - 99 mg/dL   Comment 1 Notify RN   Glucose, capillary     Status: None   Collection Time: 07/27/16 10:13 PM  Result Value Ref Range   Glucose-Capillary 88 65 - 99 mg/dL  Glucose, capillary     Status: None   Collection Time: 07/28/16  6:56 AM  Result Value Ref Range   Glucose-Capillary 97 65 - 99 mg/dL  Heparin level (unfractionated)     Status: None   Collection Time: 07/28/16  7:08 AM  Result Value Ref Range   Heparin Unfractionated 0.70 0.30 - 0.70 IU/mL    Comment:        IF HEPARIN RESULTS ARE BELOW EXPECTED VALUES, AND PATIENT DOSAGE HAS BEEN CONFIRMED, SUGGEST FOLLOW UP TESTING OF ANTITHROMBIN III LEVELS.   CBC     Status: Abnormal   Collection Time: 07/28/16  7:08 AM  Result Value Ref Range   WBC  6.4 4.0 - 10.5 K/uL   RBC 3.47 (L) 4.22 - 5.81 MIL/uL   Hemoglobin 9.4 (L) 13.0 - 17.0 g/dL   HCT 30.7 (L) 39.0 - 52.0 %   MCV 88.5 78.0 - 100.0 fL   MCH 27.1 26.0 - 34.0 pg   MCHC 30.6 30.0 - 36.0 g/dL   RDW 16.8 (H) 11.5 - 15.5 %   Platelets 285 150 - 400 K/uL  Glucose, capillary     Status: Abnormal   Collection Time: 07/28/16  8:28 AM  Result Value Ref Range   Glucose-Capillary 100 (H) 65 - 99 mg/dL  Glucose, capillary     Status: Abnormal   Collection Time: 07/28/16 12:18 PM  Result Value Ref Range   Glucose-Capillary 111 (H) 65 - 99 mg/dL  Glucose, capillary     Status: None   Collection Time: 07/28/16  4:55 PM  Result Value Ref Range   Glucose-Capillary 94 65 - 99 mg/dL  Glucose, capillary     Status: Abnormal   Collection Time: 07/28/16  9:53 PM  Result Value Ref Range   Glucose-Capillary 115 (H) 65 - 99 mg/dL  CBC     Status: Abnormal   Collection Time: 07/29/16  4:27 AM  Result Value Ref Range   WBC 6.8 4.0 - 10.5 K/uL   RBC 3.30 (L) 4.22 - 5.81 MIL/uL   Hemoglobin 9.0 (L) 13.0 - 17.0 g/dL   HCT 29.2 (L) 39.0 - 52.0 %   MCV 88.5 78.0 - 100.0 fL   MCH 27.3 26.0 - 34.0 pg   MCHC 30.8 30.0 - 36.0 g/dL   RDW 16.8 (H) 11.5 - 15.5 %   Platelets 289 150 - 400 K/uL  Glucose, capillary     Status: Abnormal   Collection Time: 07/29/16  7:35 AM  Result Value Ref Range   Glucose-Capillary 106 (H) 65 - 99 mg/dL  Glucose, capillary     Status: Abnormal   Collection Time: 07/29/16 11:28 AM  Result Value Ref Range   Glucose-Capillary 112 (H) 65 - 99 mg/dL  Glucose, capillary     Status: Abnormal   Collection Time: 07/29/16  4:28 PM  Result Value Ref Range   Glucose-Capillary 102 (H) 65 - 99 mg/dL  Glucose, capillary     Status: Abnormal   Collection Time: 07/29/16 10:35 PM  Result Value Ref Range   Glucose-Capillary 120 (H) 65 - 99 mg/dL   Comment 1 Notify RN   Basic metabolic panel     Status: Abnormal   Collection Time: 07/30/16  4:27 AM  Result Value Ref Range    Sodium 138 135 - 145 mmol/L   Potassium 3.8 3.5 - 5.1 mmol/L   Chloride 99 (L) 101 - 111 mmol/L   CO2 32 22 - 32 mmol/L   Glucose, Bld 101 (H) 65 - 99 mg/dL   BUN 14 6 - 20 mg/dL   Creatinine, Ser 0.75 0.61 - 1.24 mg/dL   Calcium 9.1 8.9 - 10.3 mg/dL   GFR calc non Af Amer >60 >60 mL/min   GFR calc Af Amer >60 >60 mL/min    Comment: (NOTE) The eGFR has been calculated using the CKD EPI equation. This calculation has not been validated in all clinical situations. eGFR's persistently <60 mL/min signify possible Chronic Kidney Disease.    Anion gap 7 5 - 15  CBC     Status: Abnormal   Collection Time: 07/30/16  4:27 AM  Result Value Ref Range   WBC 6.4 4.0 - 10.5 K/uL   RBC 3.32 (L) 4.22 - 5.81 MIL/uL   Hemoglobin 8.9 (L) 13.0 - 17.0 g/dL   HCT 29.6 (L) 39.0 - 52.0 %   MCV 89.2 78.0 - 100.0 fL   MCH 26.8 26.0 - 34.0 pg   MCHC 30.1 30.0 - 36.0 g/dL   RDW 16.7 (H) 11.5 - 15.5 %   Platelets 285 150 - 400 K/uL     HEENT: Normocephalic. Healing trauma Neck: #6 shiley, significant secretions, white thick, no bloody streaks Cardio: RRR. No JVD.  Resp: Ronchi and Unlabored GI: BS positive and ND, PEG site clean, dry and intact. Nontender to palpation. No drainage  Skin:   Other trach site without bleeding Neuro: Somnolent Sensory cannot assess due to somnolence , Motor: Unable to assess this AM due to somnolence  Musc/Skel:  No edema. No tenderness.  Gen NAD, Vital signs reviewed.  GU Foley bag chair. cherry colored urine  Assessment/Plan: 1. Functional deficits secondary to bilateral cortical and subcortical cardioembolic infarcts which require 3+ hours per day of interdisciplinary therapy in a comprehensive inpatient rehab setting. Physiatrist is providing close team supervision and 24 hour management of active medical problems listed below. Physiatrist and rehab team continue  to assess barriers to discharge/monitor patient progress toward functional and medical  goals. FIM: Function - Bathing Position: Wheelchair/chair at sink Body parts bathed by patient: Right arm, Left arm, Chest, Right upper leg, Left upper leg, Right lower leg, Left lower leg, Abdomen Body parts bathed by helper: Back Bathing not applicable: Front perineal area, Buttocks Assist Level: Touching or steadying assistance(Pt > 75%)  Function- Upper Body Dressing/Undressing What is the patient wearing?: Pull over shirt/dress Pull over shirt/dress - Perfomed by patient: Thread/unthread right sleeve, Thread/unthread left sleeve, Put head through opening, Pull shirt over trunk Pull over shirt/dress - Perfomed by helper: Put head through opening, Pull shirt over trunk Assist Level: Touching or steadying assistance(Pt > 75%) Function - Lower Body Dressing/Undressing What is the patient wearing?: Pants, Non-skid slipper socks, Socks, Shoes Position: Wheelchair/chair at [employer]- Performed by patient: Thread/unthread right pants leg Pants- Performed by helper: Thread/unthread right pants leg, Thread/unthread left pants leg, Pull pants up/down Non-skid slipper socks- Performed by patient: Don/doff right sock, Don/doff left sock Non-skid slipper socks- Performed by helper: Don/doff right sock, Don/doff left sock Socks - Performed by helper: Don/doff right sock, Don/doff left sock Shoes - Performed by helper: Don/doff right shoe, Don/doff left shoe, Fasten right, Fasten left Assist for footwear: Maximal assist Assist for lower body dressing: Touching or steadying assistance (Pt > 75%) (Max A)  Function - Toileting Toileting activity did not occur: No continent bowel/bladder event Toileting steps completed by patient: Adjust clothing after toileting Toileting steps completed by helper: Adjust clothing prior to toileting, Performs perineal hygiene, Adjust clothing after toileting Toileting Assistive Devices: Grab bar or rail Assist level: More than reasonable time, Two  helpers  Function - Air cabin crew transfer activity did not occur: Safety/medical concerns Toilet transfer assistive device: Elevated toilet seat/BSC over toilet, Mechanical lift Mechanical lift: Stedy Assist level to toilet: 2 helpers Assist level from toilet: 2 helpers Assist level to bedside commode (at bedside): 2 helpers Assist level from bedside commode (at bedside): 2 helpers  Function - Chair/bed transfer Chair/bed transfer method: Stand pivot Chair/bed transfer assist level: Moderate assist (Pt 50 - 74%/lift or lower) Chair/bed transfer assistive device: Armrests, Walker Mechanical lift: Stedy Chair/bed transfer details: Verbal cues for technique, Tactile cues for posture, Verbal cues for sequencing, Manual facilitation for weight shifting, Manual facilitation for placement  Function - Locomotion: Wheelchair Will patient use wheelchair at discharge?: Yes Type: Manual Wheelchair activity did not occur: Safety/medical concerns Max wheelchair distance: 125 Assist Level: Moderate assistance (Pt 50 - 74%) Wheel 50 feet with 2 turns activity did not occur: Safety/medical concerns Assist Level: Moderate assistance (Pt 50 - 74%) Wheel 150 feet activity did not occur: Safety/medical concerns Turns around,maneuvers to table,bed, and toilet,negotiates 3% grade,maneuvers on rugs and over doorsills: No Function - Locomotion: Ambulation Ambulation activity did not occur: Safety/medical concerns Assistive device: Walker-rolling Max distance: 67f  Assist level: Moderate assist (Pt 50 - 74%) Walk 10 feet activity did not occur: Safety/medical concerns Assist level: Moderate assist (Pt 50 - 74%) Walk 50 feet with 2 turns activity did not occur: Safety/medical concerns Walk 150 feet activity did not occur: Safety/medical concerns Walk 10 feet on uneven surfaces activity did not occur: Safety/medical concerns  Function - Comprehension Comprehension: Auditory Comprehension  assist level: Understands basic 25 - 49% of the time/ requires cueing 50 - 75% of the time  Function - Expression Expression: Verbal Expression assistive device: Talk trach valve Expression assist level: Expresses basic 25 - 49% of  the time/requires cueing 50 - 75% of the time. Uses single words/gestures.  Function - Social Interaction Social Interaction assist level: Interacts appropriately 25 - 49% of time - Needs frequent redirection.  Function - Problem Solving Problem solving assist level: Solves basic 25 - 49% of the time - needs direction more than half the time to initiate, plan or complete simple activities  Function - Memory Memory assist level: Recognizes or recalls less than 25% of the time/requires cueing greater than 75% of the time Patient normally able to recall (first 3 days only): None of the above  Medical Problem List and Plan: 1. 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 DVT Prophylaxis/Anticoagulation: Pharmaceutical: Other (comment)--Eliquis d/ced due to increase in bloody secretions, off IV heparin, continue baby aspirin 3. Pain Management: tylenol prn pain. Question muscle spasms--will monitor 4. Mood: LCSW to follow for evaluation as mentation improves.  5. Neuropsych: This patient is notcapable of making decisions on hisown behalf. 6. Skin/Wound Care: routine pressure relief measures.  7. Fluids/Electrolytes/Nutrition: Monitor I/O and adjust fluid boluses as needed. Continue tube feeds.  BMP Latest Ref Rng & Units 07/30/2016 07/23/2016 07/20/2016  Glucose 65 - 99 mg/dL 101(H) 128(H) 108(H)  BUN 6 - 20 mg/dL 14 21(H) 27(H)  Creatinine 0.61 - 1.24 mg/dL 0.75 0.76 0.76  Sodium 135 - 145 mmol/L 138 136 139  Potassium 3.5 - 5.1 mmol/L 3.8 4.2 3.9  Chloride 101 - 111 mmol/L 99(L) 99(L) 104  CO2  22 - 32 mmol/L 32 33(H) 30  Calcium 8.9 - 10.3 mg/dL 9.1 8.9 9.1  . 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 Tolerated PMV on #6 trach  , cont scopolamine patch, no evidence of sedation, secretions . Moderate, 9. Parkinson's disease: Continue Sinemet 37.5/150 qid.  10. Leucocytosis: Resolved 11. ABLA: Continue to monitor-no acute drops CBC Latest Ref Rng & Units 07/30/2016 07/29/2016 07/28/2016  WBC 4.0 - 10.5 K/uL 6.4 6.8 6.4  Hemoglobin 13.0 - 17.0 g/dL 8.9(L) 9.0(L) 9.4(L)  Hematocrit 39.0 - 52.0 % 29.6(L) 29.2(L) 30.7(L)  Platelets 150 - 400 K/uL 285 289 285     12. Hematuria: resolved , Hemoglobin steady             13. Severe dysphagia: NPO till mentation improv  tolerating PEG feedings   LOS (Days) 11 A FACE TO FACE EVALUATION WAS PERFORMED  [name],ANDREW E 07/30/2016, 6:22 AM

## 2016-07-30 NOTE — Plan of Care (Signed)
Problem: RH BLADDER ELIMINATION Goal: RH STG MANAGE BLADDER WITH EQUIPMENT WITH ASSISTANCE STG Manage Bladder With Equipment With mod Assistance  Outcome: Not Progressing Total assist with foley

## 2016-07-30 NOTE — Progress Notes (Signed)
Speech Language Pathology Daily Session Note  Patient Details  Name: Gregory LoronRichard Devenport MRN: [id number] Date of Birth: 26-Jun-1940  Today's Date: 07/30/2016 SLP Individual Time: 1300-1330 SLP Individual Time Calculation (min): 30 min  Short Term Goals: Week 2: SLP Short Term Goal 1 (Week 2): Patient will consume trials of nectar-thick liquids via tsp with minimal overt s/s of aspiration to assess readiness for repeat objective swallow assessment.  SLP Short Term Goal 2 (Week 2): Patient will increase speech intelligibility to ~75% at the phrase level with Mod A multimodal cues for use of speech intelligibility strategies.  SLP Short Term Goal 3 (Week 2): Pt will demonstrate sustained attention to a functional task for ~ 2 minutes with Mod A verbal cues. for redirection to task.  SLP Short Term Goal 4 (Week 2): Pt will tolerate PMSV for during all waking hours with full supervision without decline in vitals.  SLP Short Term Goal 5 (Week 2): Pt will complete basic familiar tasks (related to ADLs, brushing hair, oral care) with Mod A verbal cues.   Skilled Therapeutic Interventions: Skilled treatment session focused on addressing speech and swallow goals. SLP facilitated session by providing skilled observation of patient with PMSV donned with vitals remaining [facility] for 25 of the 30 minute session.  SpO2 readings dropped at session's end likely due to fatigue and more shallow breathing. As a result, PMSV removed.  SLP also facilitated session with Max assist multimodal cues for brief sustained attention to conversation and tasks related to PO.  Patient consumed 3 teaspoons of nectar-thick cranberry juice with no overt s/s; however, pink tinged tracheal secretions noted at sessions end when PMSV was removed.  RN notified it was likely due to PO trials.  Continue with current plan of care.    Function:  Eating Eating     Eating Assist Level: Helper performs IV, parenteral or tube feed            Cognition Comprehension Comprehension assist level: Understands basic 25 - 49% of the time/ requires cueing 50 - 75% of the time  Expression Expression assistive device: Talk trach valve Expression assist level: Expresses basic 25 - 49% of the time/requires cueing 50 - 75% of the time. Uses single words/gestures.  Social Interaction Social Interaction assist level: Interacts appropriately 25 - 49% of time - Needs frequent redirection.  Problem Solving Problem solving assist level: Solves basic 25 - 49% of the time - needs direction more than half the time to initiate, plan or complete simple activities  Memory Memory assist level: Recognizes or recalls less than 25% of the time/requires cueing greater than 75% of the time    Pain Pain Assessment Pain Assessment: No/denies pain  Therapy/Group: Individual Therapy  Charlane FerrettiMelissa Trayden Brandy, M.A., CCC-SLP [phone]  Jonea Bukowski 07/30/2016, 4:07 PM

## 2016-07-31 ENCOUNTER — Inpatient Hospital Stay (HOSPITAL_COMMUNITY): Payer: Medicare Other | Admitting: Physical Therapy

## 2016-07-31 ENCOUNTER — Inpatient Hospital Stay (HOSPITAL_COMMUNITY): Payer: Medicare Other | Admitting: Occupational Therapy

## 2016-07-31 ENCOUNTER — Inpatient Hospital Stay (HOSPITAL_COMMUNITY): Payer: Medicare Other

## 2016-07-31 ENCOUNTER — Inpatient Hospital Stay (HOSPITAL_COMMUNITY): Payer: Medicare Other | Admitting: Speech Pathology

## 2016-07-31 LAB — CBC
HCT: 29.7 % — ABNORMAL LOW (ref 39.0–52.0)
HEMOGLOBIN: 9.1 g/dL — AB (ref 13.0–17.0)
MCH: 27.4 pg (ref 26.0–34.0)
MCHC: 30.6 g/dL (ref 30.0–36.0)
MCV: 89.5 fL (ref 78.0–100.0)
Platelets: 305 10*3/uL (ref 150–400)
RBC: 3.32 MIL/uL — AB (ref 4.22–5.81)
RDW: 17.2 % — ABNORMAL HIGH (ref 11.5–15.5)
WBC: 7.2 10*3/uL (ref 4.0–10.5)

## 2016-07-31 LAB — GLUCOSE, CAPILLARY
GLUCOSE-CAPILLARY: 95 mg/dL (ref 65–99)
GLUCOSE-CAPILLARY: 121 mg/dL — AB (ref 65–99)
GLUCOSE-CAPILLARY: 82 mg/dL (ref 65–99)
GLUCOSE-CAPILLARY: 117 mg/dL — AB (ref 65–99)

## 2016-07-31 LAB — OCCULT BLOOD X 1 CARD TO LAB, STOOL: FECAL OCCULT BLD: NEGATIVE

## 2016-07-31 MED ORDER — NAPHAZOLINE-GLYCERIN 0.012-0.2 % OP SOLN
1.0000 [drp] | Freq: Three times a day (TID) | OPHTHALMIC | Status: DC
  Administered 2016-07-31: 1 [drp] via OPHTHALMIC
  Administered 2016-08-01 – 2016-08-02 (×4): 2 [drp] via OPHTHALMIC
  Administered 2016-08-02: 1 [drp] via OPHTHALMIC
  Administered 2016-08-02 – 2016-08-03 (×3): 2 [drp] via OPHTHALMIC
  Administered 2016-08-03: 1 [drp] via OPHTHALMIC
  Administered 2016-08-04: 2 [drp] via OPHTHALMIC
  Administered 2016-08-04 (×2): 1 [drp] via OPHTHALMIC
  Administered 2016-08-05: 2 [drp] via OPHTHALMIC
  Administered 2016-08-05 (×2): 1 [drp] via OPHTHALMIC
  Administered 2016-08-06 – 2016-08-07 (×3): 2 [drp] via OPHTHALMIC
  Administered 2016-08-07 – 2016-08-11 (×10): 1 [drp] via OPHTHALMIC
  Administered 2016-08-11 – 2016-08-12 (×5): 2 [drp] via OPHTHALMIC
  Administered 2016-08-13 – 2016-08-14 (×2): 1 [drp] via OPHTHALMIC
  Administered 2016-08-16 – 2016-08-17 (×4): 2 [drp] via OPHTHALMIC
  Filled 2016-07-31 (×3): qty 15

## 2016-07-31 MED ORDER — NAPHAZOLINE-GLYCERIN 0.012-0.2 % OP SOLN: 1.0000 [drp] | Freq: Three times a day (TID) | OPHTHALMIC | Status: DC

## 2016-07-31 NOTE — Progress Notes (Signed)
Occupational Therapy Session Note  Patient Details  Name: Rebecca Motta MRN: [id number] Date of Birth: 1940-09-25  Today's Date: 07/31/2016 OT Individual Time: 1332-1406 OT Individual Time Calculation (min): 34 min    Short Term Goals: Week 1:  OT Short Term Goal 1 (Week 1): Pt will maintain sustained attention to selfcare tasks for 20 mins with no more than min instructional cueing for re-direction.  OT Short Term Goal 1 - Progress (Week 1): Met OT Short Term Goal 2 (Week 1): Pt will maintain dynamic sitting EOB or EOC during ADL with no more than mod assist.  OT Short Term Goal 2 - Progress (Week 1): Met OT Short Term Goal 3 (Week 1): Pt will complete UB bathing with supervision sitting unsupported.  OT Short Term Goal 3 - Progress (Week 1): Met OT Short Term Goal 4 (Week 1): Pt will completed LB bathing sit to stand with max assist of one.  OT Short Term Goal 4 - Progress (Week 1): Met OT Short Term Goal 5 (Week 1): Pt will donn pullup pants with max assist using AE sit to stand.  OT Short Term Goal 5 - Progress (Week 1): Met  Skilled Therapeutic Interventions/Progress Updates:    Pt supine in bed sleeping at start of session.  He was able to arouse easily to verbal and tactile stimuli.  Worked on stretching of glutes and hamstrings bilaterally while in supine.  Pt with increased tightness with all passive movements.  Had him bend up knees bilaterally and work on rotating side to side for stretching.  Each stretch maintained for 30 secs or more.  Once completed pt transitioned to sitting with mod assist, with attempted use of a flexion pattern in the trunk.  Mod assist for small weightshifts to the right and left to complete reciprical scooting, which was max assist overall.   Completed transfer to wheelchair with mod assist and pt using multiple small scoots to move toward the wheelchair and then a partial stand with mod assist to move hips from bed to wheelchair.  Once in wheelchair  educated daughter [place] on BUE shoulder AAROM exercises to be complete daily for sets of 10 repetitions.  Pt left with SLP for next session.    Therapy Documentation Precautions:  Precautions Precautions: Fall Precaution Comments: B wrist restraints Restrictions Weight Bearing Restrictions: No  Vital Signs: Therapy Vitals Pulse Rate: 69 Resp: 16 Patient Position (if appropriate): Lying Oxygen Therapy SpO2: 99 % O2 Device: Tracheostomy Collar O2 Flow Rate (L/min): 6 L/min FiO2 (%): 28 % Pain: Pain Assessment Pain Assessment: No/denies pain ADL: See Function Navigator for Current Functional Status.   Therapy/Group: Individual Therapy  Latoi Giraldo OTR/L 07/31/2016, 4:28 PM

## 2016-07-31 NOTE — Progress Notes (Signed)
Occupational Therapy Session Note  Patient Details  Name: Gregory [name] MRN: [id number] Date of Birth: [birth year]  Today's Date: 07/31/2016 OT Individual Time: 0901-1000 OT Individual Time Calculation (min): 59 min    Short Term Goals: Week 2:  OT Short Term Goal 1 (Week 2): Pt will complete UB dressing sitting unsupported with supervision for 2 consecutive sessions.  OT Short Term Goal 2 (Week 2): pt will complete LB bathing sit to stand with min assist 2 consecutive sessions.  OT Short Term Goal 3 (Week 2): Pt will complete LB dressing sit to stand with mod assist for 2 consecutive sessions.  OT Short Term Goal 4 (Week 2): Pt will complete stand pivot transfers to the [facility] with mod assist for toileting tasks.  OT Short Term Goal 5 (Week 2): Pt will maintain standing with min guard assist while completing 2-3 grooming tasks.    Skilled Therapeutic Interventions/Progress Updates:    Pt completed supine to sit EOB with max assist in preparation for selfcare tasks.  He was able to sit statically EOB with close supervision, but still demonstrates a slight posterior lean.  UB bathing with close supervision overall.  Pt needs min assist for washing the back of his neck secondary to BUE shoulder AROM limitations.  He was able to cross his LEs with mod assist in order to wash his feet.  Mod instructional cueing to remember to wash his upper legs as he skipped this and went down to remove socks and wash his lower feet instead.  He needed min instructional cueing for re-direction on one instance as he attempted to wash his foot again, without remembering that he had already completed it.  Increased posterior lean noted with standing this session requiring max assist to correct.  Also with decreased forward trunk flexion with sit to stand resulting in his feet sliding out from under him X 2.  Donned shirt with mod assist demonstrating the need for assistance to pull it over his head and down over his  trunk.  He was able to donn the RLE in his shorts but then needed mod assist for the left and max assist for pulling them over his hips.  Finished session with therapist assist with donning socks and shoes secondary to decreased time.  Max assist for transfer stand pivot to the wheelchair.  Pt left with daughter [name] and PT Clydie BraunKaren in for next session.      Therapy Documentation Precautions:  Precautions Precautions: Fall Precaution Comments: B wrist restraints Restrictions Weight Bearing Restrictions: No  Vital Signs: Therapy Vitals Pulse Rate: 69 Resp: 16 Patient Position (if appropriate): Lying Oxygen Therapy SpO2: 99% O2 Device: Tracheostomy Collar O2 Flow Rate (L/min): 6 L/min FiO2 (%): 28 % Pain: Pain Assessment Pain Assessment: No/denies pain ADL: See Function Navigator for Current Functional Status.   Therapy/Group: Individual Therapy  Nel Stoneking OTR/L 07/31/2016, 12:28 PM
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
## 2016-07-31 NOTE — Progress Notes (Signed)
Physical Therapy Note  Patient Details  Name: Gregory [name] MRN: [id number] Date of Birth: February 01, 1941 Today's Date: 07/31/2016    Time: 1000-1041 41 minutes  1:1 No c/o pain. Pt rec'd finishing OT session in w/c. Pt fatigued but agreeable to PT session. Session focused on activity tolerance and performing activity with decreased rest breaks.  Gait with RW with min A for balance, cues to decrease shuffling and freezing gait, 50' x 3.  Pt with posterior lean when fatigued, requiring min A and cuing to correct.  Sit to stand training with focus on forward lean with min A.  Nu step x 6 minutes level 4 for UE/LE strength and endurance. Pt pleased with progress  [name] 07/31/2016, 10:41 AM

## 2016-07-31 NOTE — Progress Notes (Signed)
Physical Therapy Session Note  Patient Details  Name: Gregory [name] MRN: [id number] Date of Birth: [birth year]  Today's Date: 07/31/2016 PT Individual Time: 1138-1210 PT Individual Time Calculation (min): 32 min   Short Term Goals: Week 2:  PT Short Term Goal 1 (Week 2): Pt will performed WC mobility x 12020ft with supervision assist from PT PT Short Term Goal 2 (Week 2): Pt will ascend 4 steps(3") with mod assist  PT Short Term Goal 3 (Week 2): Pt will perform bed mobility with min assist  PT Short Term Goal 4 (Week 2): pt will ambulate 5650ft with mod assist  PT Short Term Goal 5 (Week 2): pt will perform stand pivot transfer with consistent mod assist   Skilled Therapeutic Interventions/Progress Updates:   Neuro re-ed to address transfers, sit <> stands, and bed mobility with extra time for sequencing and processing and overall mod to max assist. Facilitation for anterior weightshift during transfer with posterior bias in standing with cues for advancement of RLE during stand pivot transfer with facilitaiton for lateral weightshift. Scooting to the R while seated EOB for improved carryover with squat pivot transfer technique with extra time and supervision. Mod assist to return to supine and engaged in bridging for pad adjustment. Supine hamstring stretches and hip adductor stretches x 30 seconds each x 2 reps for improved flexibility with mobility. Bed alarm on and wrist restraints donned. PMSV removed to allow for patient to rest before next therapy session.   Therapy Documentation Precautions:  Precautions Precautions: Fall Precaution Comments: B wrist restraints Restrictions Weight Bearing Restrictions: No   Vital Signs: Therapy Vitals Oxygen Therapy SpO2: 91-100 % O2 Device: Tracheostomy Collar O2 Flow Rate (L/min): 6 L/min FiO2 (%): 28 % Pain: Pain Assessment Pain Assessment: No/denies pain  See Function Navigator for Current Functional Status.   Therapy/Group:  Individual Therapy  Karolee StampsGray, Aidan Moten Darrol PokeBrescia  Shawanda Sievert B. Loyalty Brashier, PT, DPT  07/31/2016, 12:29 PM

## 2016-07-31 NOTE — Progress Notes (Signed)
Speech Language Pathology Daily Session Note  Patient Details  Name: Gregory [name] MRN: [id number] Date of Birth: [birth year]  Today's Date: 07/31/2016   Treatment Session #1 SLP Individual Time: 0830-0900 SLP Individual Time Calculation (min): 30 min  Treatment Session #2 SLP Individual Time: 1400-1430 SLP Individual time Calculation (min): 30 min   Short Term Goals: Week 2: SLP Short Term Goal 1 (Week 2): Patient will consume trials of nectar-thick liquids via tsp with minimal overt s/s of aspiration to assess readiness for repeat objective swallow assessment.  SLP Short Term Goal 2 (Week 2): Patient will increase speech intelligibility to ~75% at the phrase level with Mod A multimodal cues for use of speech intelligibility strategies.  SLP Short Term Goal 3 (Week 2): Pt will demonstrate sustained attention to a functional task for ~ 2 minutes with Mod A verbal cues. for redirection to task.  SLP Short Term Goal 4 (Week 2): Pt will tolerate PMSV for during all waking hours with full supervision without decline in vitals.  SLP Short Term Goal 5 (Week 2): Pt will complete basic familiar tasks (related to ADLs, brushing hair, oral care) with Mod A verbal cues.   Skilled Therapeutic Interventions:  Skilled Treatment Session #1: Skilled treatment session focused on cognition goals. SLP facilitated session by providing Max Multimodal cues for deep breathing and productive cough. Pt with frequent off-topic comments that he was able to "really cough yesterday." Pt unable to produce cough with Max cues but is able to increase strength of throat clears with Max multimodal cues. Pt required Max multimodal cues for use of calendar to recall orientation information. Pt's voice overall was clearer throughout session which is improvement over complete weakness in last reporting period. All questions answered to pt and daughter satisfaction at this time. Pt left upright in bed, PMSV in place, restraints  off, daughter present, bed alarm on and all needs with in reach. Continue current plan of care.   Skilled Treatment Session #2: Skilled treatment session focused on dysphagia and speech communication goals. SLP received pt upright in wheelchair with daughter present and PMSV donned. After oral care, pt given 1 teaspoon trial of nectar thick liquid with O2 dropping to 75% then 70%. Further O2 reading taken with dynamap, which read the same numbers. Pt with no overt s/s of respiratory distress or increased secretions. Nursing present. SLP provided Max A multimodal cues for deep breathing. Pt unable to increase O2, SLP removed PMSV and pt able to regain O2. PMSV replaced and no further decline throughout rest of session. Further trials of nectar thick given via spoon, no leakage seen at trach hob and no overt s/s of aspiration. Pt's voice remained clear throughout. Will continue to closely monitor d/t variable condition. Max education provided to pt on need to produce increase cough. Pt was left upright in wheelchair, safety belt donned, PMSV in place and daughter present. Continue per current plan of care.       Function:   Expression Expression assistive device: Talk trach valve Expression assist level: Expresses basic 25 - 49% of the time/requires cueing 50 - 75% of the time. Uses single words/gestures.  Social Interaction Social Interaction assist level: Interacts appropriately 25 - 49% of time - Needs frequent redirection.  Problem Solving Problem solving assist level: Solves basic 25 - 49% of the time - needs direction more than half the time to initiate, plan or complete simple activities  Memory Memory assist level: Recognizes or recalls less than 25% of  the time/requires cueing greater than 75% of the time    Pain    Therapy/Group: Individual Therapy   Gregory [name], M.S., CCC-SLP Speech-Language Pathologist   Gregory [name] 07/31/2016, 12:08 PM

## 2016-08-01 ENCOUNTER — Inpatient Hospital Stay (HOSPITAL_COMMUNITY): Payer: Medicare Other | Admitting: Physical Therapy

## 2016-08-01 ENCOUNTER — Inpatient Hospital Stay (HOSPITAL_COMMUNITY): Payer: Medicare Other | Admitting: Occupational Therapy

## 2016-08-01 ENCOUNTER — Inpatient Hospital Stay (HOSPITAL_COMMUNITY): Payer: Medicare Other | Admitting: Speech Pathology

## 2016-08-01 DIAGNOSIS — I6300 Cerebral infarction due to thrombosis of unspecified precerebral artery: Secondary | ICD-10-CM

## 2016-08-01 LAB — CBC
HEMATOCRIT: 29.5 % — AB (ref 39.0–52.0)
Hemoglobin: 8.9 g/dL — ABNORMAL LOW (ref 13.0–17.0)
MCH: 26.9 pg (ref 26.0–34.0)
MCHC: 30.2 g/dL (ref 30.0–36.0)
MCV: 89.1 fL (ref 78.0–100.0)
Platelets: 288 10*3/uL (ref 150–400)
RBC: 3.31 MIL/uL — ABNORMAL LOW (ref 4.22–5.81)
RDW: 16.9 % — AB (ref 11.5–15.5)
WBC: 6.3 10*3/uL (ref 4.0–10.5)

## 2016-08-01 LAB — GLUCOSE, CAPILLARY
GLUCOSE-CAPILLARY: 94 mg/dL (ref 65–99)
Glucose-Capillary: 79 mg/dL (ref 65–99)
Glucose-Capillary: 88 mg/dL (ref 65–99)

## 2016-08-01 MED ORDER — PRO-STAT SUGAR FREE PO LIQD
30.0000 mL | Freq: Three times a day (TID) | ORAL | Status: DC
  Administered 2016-08-01 – 2016-08-16 (×42): 30 mL
  Filled 2016-08-01 (×42): qty 30

## 2016-08-01 NOTE — Progress Notes (Signed)
Physical Therapy Session Note  Patient Details  Name: Gregory [name] MRN: [id number] Date of Birth: November 18, 1940  Today's Date: 08/01/2016 PT Individual Time: 1102-1205 1535-1600 PT Individual Time Calculation (min): 63 min AND 25 min   Short Term Goals: Week 2:  PT Short Term Goal 1 (Week 2): Pt will performed WC mobility x 165f with supervision assist from PT PT Short Term Goal 2 (Week 2): Pt will ascend 4 steps(3") with mod assist  PT Short Term Goal 3 (Week 2): Pt will perform bed mobility with min assist  PT Short Term Goal 4 (Week 2): pt will ambulate 561fwith mod assist  PT Short Term Goal 5 (Week 2): pt will perform stand pivot transfer with consistent mod assist   Skilled Therapeutic Interventions/Progress Updates:   Pt received sitting in TIS WC and agreeable to PT. PT instructed patient in stand pivot Transfer to Manual WC with mod assist from PT. WC mobility to rehab gym with min assist from PT with all 4 extremities and moderate verbal instruction for improved use of R UE  Blocked practice Sit<>stand transfer training with max assist progressing to mod assist from PT with max multi modal cues for proper use of BUE and increased anterior weight shift to prevent posterior LOB.   Gait training instructed by PT with RW and min assist x [id number] required to provide moderate multimodal instruction for increased step length and gait pattern in turns due to freezing and festination.   Additional sit<>stand transfer training from EOM with mod assist progressing to min assist with no AD and visual cues for proper anterior displacement to prevent use of BLE to lock on bed. Blocked practice lateral scooting and anterior/posterior scooting. With min assist and max multimodal instruction to prevent sheer forces on gluteal surface.   Pt returned to room and performed stand pivot transfer to bed with mod assist. Sit>supine completed with mod assist and left supine in bed with call bell in  reach and all needs met.   Pt maintained on Supplemental 02 throughout treatment via trach collar on 6 L/min: SpO2 >95%.   Session 2:   Pt received sitting in WC and agreeable to PT  Pt transported patient to rehab gym with total assist [facility]. Stand pivot transfer without AD completed to nustep with mod assist from PT as well as maximal multimodal instructed to sequencing, safety, posture, and gait pattern.   Nustep endurance training as well as re-enforcement of reciprocal movement pattern completed in 6 bouts of 2 minutes with PT to assist Vitals after each bout. Cues for increased ROM throughout. On first bout SpO2 desat to 86% and increased to >90% after 1 minute of rest. PT provided cues for pursed lip breathing and all other vitals assist SpO2 >94%.  Pt performed stand [facility] and mod assist from PT. Pt left with son and wife at end of treatment sitting in TIS WC.   Throughout treatment patient required no Supplemental O2 with trach cap in place.          Therapy Documentation Precautions:  Precautions Precautions: Fall Precaution Comments: B wrist restraints Restrictions Weight Bearing Restrictions: No    Vital Signs: Therapy Vitals Patient Position (if appropriate): Lying Oxygen Therapy SpO2: 99 % O2 Flow Rate (L/min): 6 L/min FiO2 (%): 28 % Pulse Oximetry Type: Continuous Pain: Pain Assessment Pain Assessment: No/denies pain   See Function Navigator for Current Functional Status.   Therapy/Group:  Individual Therapy  Gregory [name] 08/01/2016, 12:53 PM

## 2016-08-01 NOTE — Progress Notes (Signed)
Occupational Therapy Session Note  Patient Details  Name: Gregory [name] MRN: [id number] Date of Birth: 1941/02/28  Today's Date: 08/01/2016 OT Individual Time: 1345-1421 OT Individual Time Calculation (min): 36 min    Short Term Goals: Week 2:  OT Short Term Goal 1 (Week 2): Pt will complete UB dressing sitting unsupported with supervision for 2 consecutive sessions.  OT Short Term Goal 2 (Week 2): pt will complete LB bathing sit to stand with min assist 2 consecutive sessions.  OT Short Term Goal 3 (Week 2): Pt will complete LB dressing sit to stand with mod assist for 2 consecutive sessions.  OT Short Term Goal 4 (Week 2): Pt [facility] assist for toileting tasks.  OT Short Term Goal 5 (Week 2): Pt will maintain standing with min guard assist while completing 2-3 grooming tasks.    Skilled Therapeutic Interventions/Progress Updates:    Pt now with plugged trach and no O2.  Sats monitored throughout session with pt staying above 90% on room air.  Completed transfer to sitting from supine with min assist.  Max assist for scooting to the EOB as pt demonstrates decreased ability to grade knee extension to complete this.  Sit to stand from the EOB with mod assist, using the RW for support.  Once standing he was able to release one UE at a time off of the walker in order to complete overhead reaching task to target.  Min assist for standing balance with min facilitation for reaching greater than 110 degrees.  He completed 10 repetitions on each UE while standing and then ambulated to the door of his room.  Min assist for mobility with decreased step length noted at times.  Transitioned back over to the tilt in space wheelchair to finish session.  Wrist restraints in place as well as safety belt.  Oxygen sats greater than 97% on room air.    Therapy Documentation Precautions:  Precautions Precautions: Fall Precaution Comments: B wrist  restraints Restrictions Weight Bearing Restrictions: No  Vital Signs: Therapy Vitals Pulse Rate: 74 Resp: 16 Patient Position (if appropriate): Lying Oxygen Therapy SpO2: 98 % O2 Device: Not Delivered Pulse Oximetry Type: Continuous Pain: Pain Assessment Pain Assessment: No/denies pain ADL: See Function Navigator for Current Functional Status.   Therapy/Group: Individual Therapy  Loy Mccartt OTR/L 08/01/2016, 4:01 PM

## 2016-08-01 NOTE — Progress Notes (Signed)
RT note: patient trach water bottle changed without complications.

## 2016-08-01 NOTE — Progress Notes (Signed)
Occupational Therapy Session Note  Patient Details  Name: Gregory [name] MRN: [id number] Date of Birth: 28-Dec-1940  Today's Date: 08/01/2016 OT Individual Time: [id number] OT Individual Time Calculation (min): 56 min    Short Term Goals: Week 2:  OT Short Term Goal 1 (Week 2): Pt will complete UB dressing sitting unsupported with supervision for 2 consecutive sessions.  OT Short Term Goal 2 (Week 2): pt will complete LB bathing sit to stand with min assist 2 consecutive sessions.  OT Short Term Goal 3 (Week 2): Pt will complete LB dressing sit to stand with mod assist for 2 consecutive sessions.  OT Short Term Goal 4 (Week 2): Pt [facility] assist for toileting tasks.  OT Short Term Goal 5 (Week 2): Pt will maintain standing with min guard assist while completing 2-3 grooming tasks.    Skilled Therapeutic Interventions/Progress Updates:    Pt completed bathing and dressing sitting EOB.  Increased posterior lean in sitting but maintains balance with supervision during UB bathing and dressing.  Max assist for sit to stand from the EOB with max demonstrational cueing for hand placement and technique for forward weightshift.  Pt with posterior lean with decreased timing of hip and knee extension. He needs min assist for balance when crossing LEs for washing feet as well as for donning socks.  Decreased ability to sequence bathing as he attempted to wash his arms on for second attempt and needed cueing to re-direct.  Still needing mod assist for donning pullover shirt secondary to decreased bilateral shoulder AROM.  Transfers stand pivot to the wheelchair with max assist stand pivot with the RW.  Pt left in wheelchair with daughter Gregory [name] present.  PMV in place with no restraints since daughter is supervising.  Notified nursing of need for barrier cream and of emptying catheter.    Therapy Documentation Precautions:  Precautions Precautions:  Fall Precaution Comments: B wrist restraints Restrictions Weight Bearing Restrictions: No  Vital Signs: Therapy Vitals Pulse Rate: 72 Resp: 15 Patient Position (if appropriate): Lying Oxygen Therapy SpO2: 99 % O2 Device: Tracheostomy Collar O2 Flow Rate (L/min): 6 L/min FiO2 (%): 28 % Pulse Oximetry Type: Continuous Pain: Pain Assessment Pain Assessment: No/denies pain ADL: See Function Navigator for Current Functional Status.   Therapy/Group: Individual Therapy  Kimbra Marcelino OTR/L 08/01/2016, 10:41 AM

## 2016-08-01 NOTE — Progress Notes (Signed)
Social Work Patient ID: Gregory [name], male   DOB: 12-12-1940, 76 y.o.   MRN: [id number]   CSW met with pt and his dtr, Gregory [name], to update them on team conference discussion and new targeted d/c date of 08-18-15.  They were both pleased with d/c date and pt's progress.  Dtr and pt expressed appreciation of nursing care and therapy care and pt even stated he was going to take some of the team home with him.  Pt/dtr with no needs currently, but CSW continues to be available, as needed to assist.
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
## 2016-08-01 NOTE — Progress Notes (Signed)
Patient is now capped per MD order and tolerating well at this time.

## 2016-08-01 NOTE — Progress Notes (Signed)
Subjective/Complaints:  Discussed with nursing and speech therapy, patient is starting to cough up secretions through his mouth while the [employer] valve is on. Only require deep suctioning times 2 today Appreciate pulmonary follow-up Repeat MBS planned ROS: Limited due to cognition  Objective: Vital Signs: Blood pressure (!) 118/59, pulse 60, temperature 98.5 F (36.9 C), temperature source Oral, resp. rate 16, height 5' 9" (1.753 m), weight 80.7 kg (178 lb), SpO2 100 %. No results found. Results for orders placed or performed during the hospital encounter of 07/19/16 (from the past 72 hour(s))  Glucose, capillary     Status: Abnormal   Collection Time: 07/29/16  7:35 AM  Result Value Ref Range   Glucose-Capillary 106 (H) 65 - 99 mg/dL  Glucose, capillary     Status: Abnormal   Collection Time: 07/29/16 11:28 AM  Result Value Ref Range   Glucose-Capillary 112 (H) 65 - 99 mg/dL  Glucose, capillary     Status: Abnormal   Collection Time: 07/29/16  4:28 PM  Result Value Ref Range   Glucose-Capillary 102 (H) 65 - 99 mg/dL  Glucose, capillary     Status: Abnormal   Collection Time: 07/29/16 10:35 PM  Result Value Ref Range   Glucose-Capillary 120 (H) 65 - 99 mg/dL   Comment 1 Notify RN   Basic metabolic panel     Status: Abnormal   Collection Time: 07/30/16  4:27 AM  Result Value Ref Range   Sodium 138 135 - 145 mmol/L   Potassium 3.8 3.5 - 5.1 mmol/L   Chloride 99 (L) 101 - 111 mmol/L   CO2 32 22 - 32 mmol/L   Glucose, Bld 101 (H) 65 - 99 mg/dL   BUN 14 6 - 20 mg/dL   Creatinine, Ser 0.75 0.61 - 1.24 mg/dL   Calcium 9.1 8.9 - 10.3 mg/dL   GFR calc non Af Amer >60 >60 mL/min   GFR calc Af Amer >60 >60 mL/min    Comment: (NOTE) The eGFR has been calculated using the CKD EPI equation. This calculation has not been validated in all clinical situations. eGFR's persistently <60 mL/min signify possible Chronic Kidney Disease.    Anion gap 7 5 - 15  CBC     Status: Abnormal    Collection Time: 07/30/16  4:27 AM  Result Value Ref Range   WBC 6.4 4.0 - 10.5 K/uL   RBC 3.32 (L) 4.22 - 5.81 MIL/uL   Hemoglobin 8.9 (L) 13.0 - 17.0 g/dL   HCT 29.6 (L) 39.0 - 52.0 %   MCV 89.2 78.0 - 100.0 fL   MCH 26.8 26.0 - 34.0 pg   MCHC 30.1 30.0 - 36.0 g/dL   RDW 16.7 (H) 11.5 - 15.5 %   Platelets 285 150 - 400 K/uL  Glucose, capillary     Status: None   Collection Time: 07/30/16  7:00 AM  Result Value Ref Range   Glucose-Capillary 99 65 - 99 mg/dL  Glucose, capillary     Status: None   Collection Time: 07/30/16 11:44 AM  Result Value Ref Range   Glucose-Capillary 97 65 - 99 mg/dL  Glucose, capillary     Status: Abnormal   Collection Time: 07/30/16  5:24 PM  Result Value Ref Range   Glucose-Capillary 103 (H) 65 - 99 mg/dL  Glucose, capillary     Status: Abnormal   Collection Time: 07/30/16  8:24 PM  Result Value Ref Range   Glucose-Capillary 100 (H) 65 - 99 mg/dL  CBC  Status: Abnormal   Collection Time: 07/31/16  4:42 AM  Result Value Ref Range   WBC 7.2 4.0 - 10.5 K/uL   RBC 3.32 (L) 4.22 - 5.81 MIL/uL   Hemoglobin 9.1 (L) 13.0 - 17.0 g/dL   HCT 29.7 (L) 39.0 - 52.0 %   MCV 89.5 78.0 - 100.0 fL   MCH 27.4 26.0 - 34.0 pg   MCHC 30.6 30.0 - 36.0 g/dL   RDW 17.2 (H) 11.5 - 15.5 %   Platelets 305 150 - 400 K/uL  Glucose, capillary     Status: None   Collection Time: 07/31/16  6:57 AM  Result Value Ref Range   Glucose-Capillary 95 65 - 99 mg/dL  Glucose, capillary     Status: Abnormal   Collection Time: 07/31/16 11:27 AM  Result Value Ref Range   Glucose-Capillary 121 (H) 65 - 99 mg/dL  Occult blood card to lab, stool     Status: None   Collection Time: 07/31/16  3:09 PM  Result Value Ref Range   Fecal Occult Bld NEGATIVE NEGATIVE  Glucose, capillary     Status: None   Collection Time: 07/31/16  4:32 PM  Result Value Ref Range   Glucose-Capillary 82 65 - 99 mg/dL  Glucose, capillary     Status: Abnormal   Collection Time: 07/31/16  8:39 PM  Result  Value Ref Range   Glucose-Capillary 117 (H) 65 - 99 mg/dL  CBC     Status: Abnormal   Collection Time: 08/01/16  4:13 AM  Result Value Ref Range   WBC 6.3 4.0 - 10.5 K/uL   RBC 3.31 (L) 4.22 - 5.81 MIL/uL   Hemoglobin 8.9 (L) 13.0 - 17.0 g/dL   HCT 29.5 (L) 39.0 - 52.0 %   MCV 89.1 78.0 - 100.0 fL   MCH 26.9 26.0 - 34.0 pg   MCHC 30.2 30.0 - 36.0 g/dL   RDW 16.9 (H) 11.5 - 15.5 %   Platelets 288 150 - 400 K/uL     HEENT: Normocephalic. Healing trauma Neck: #6 shiley, significant secretions, white thick, no bloody streaks Cardio: RRR. No JVD.  Resp: Ronchi and Unlabored GI: BS positive and ND, PEG site clean, dry and intact. Nontender to palpation. No drainage  Skin:   Other trach site without bleeding Neuro: Somnolent Sensory cannot assess due to somnolence , Motor: Unable to assess this AM due to somnolence  Musc/Skel:  No edema. No tenderness.  Gen NAD, Vital signs reviewed.  GU Foley bag .golden colored urine  Assessment/Plan: 1. Functional deficits secondary to bilateral cortical and subcortical cardioembolic infarcts which require 3+ hours per day of interdisciplinary therapy in a comprehensive inpatient rehab setting. Physiatrist is providing close team supervision and 24 hour management of active medical problems listed below. Physiatrist and rehab team continue to assess barriers to discharge/monitor patient progress toward functional and medical goals. FIM: Function - Bathing Position: Sitting EOB Body parts bathed by patient: Right arm, Left arm, Chest, Abdomen, Right upper leg, Left upper leg Body parts bathed by helper: Right lower leg, Left lower leg, Back, Front perineal area, Buttocks Bathing not applicable: Front perineal area, Buttocks Assist Level: Touching or steadying assistance(Pt > 75%)  Function- Upper Body Dressing/Undressing What is the patient wearing?: Pull over shirt/dress Pull over shirt/dress - Perfomed by patient: Thread/unthread right sleeve,  Thread/unthread left sleeve Pull over shirt/dress - Perfomed by helper: Put head through opening, Pull shirt over trunk Assist Level: Supervision or verbal cues Function - Lower  Body Dressing/Undressing What is the patient wearing?: Pants, Socks, Shoes Position: Sitting EOB Pants- Performed by patient: Thread/unthread right pants leg Pants- Performed by helper: Thread/unthread left pants leg, Pull pants up/down Non-skid slipper socks- Performed by patient: Don/doff right sock, Don/doff left sock Non-skid slipper socks- Performed by helper: Don/doff right sock, Don/doff left sock (therapist assist secondary to decreased time) Socks - Performed by patient: Don/doff right sock, Don/doff left sock Socks - Performed by helper: Don/doff right sock, Don/doff left sock Shoes - Performed by helper: Don/doff right shoe, Don/doff left shoe, Fasten right, Fasten left (therapist assist secondary to decreased time) Assist for footwear: Maximal assist Assist for lower body dressing: Touching or steadying assistance (Pt > 75%) (Max A)  Function - Toileting Toileting activity did not occur: No continent bowel/bladder event Toileting steps completed by patient: Adjust clothing after toileting Toileting steps completed by helper: Adjust clothing prior to toileting, Performs perineal hygiene, Adjust clothing after toileting Toileting Assistive Devices: Grab bar or rail Assist level: More than reasonable time, Two helpers  Function - Air cabin crew transfer activity did not occur: Safety/medical concerns Toilet transfer assistive device: Elevated toilet seat/BSC over toilet, Mechanical lift Mechanical lift: Stedy Assist level to toilet: Moderate assist (Pt 50 - 74%/lift or lower) Assist level from toilet: Moderate assist (Pt 50 - 74%/lift or lower) Assist level to bedside commode (at bedside): 2 helpers Assist level from bedside commode (at bedside): 2 helpers  Function - Chair/bed  transfer Chair/bed transfer method: Stand pivot Chair/bed transfer assist level: Maximal assist (Pt 25 - 49%/lift and lower) Chair/bed transfer assistive device: Armrests Mechanical lift: Stedy Chair/bed transfer details: Verbal cues for technique, Tactile cues for posture, Verbal cues for sequencing, Manual facilitation for weight shifting, Manual facilitation for placement  Function - Locomotion: Wheelchair Will patient use wheelchair at discharge?: Yes Type: Manual Wheelchair activity did not occur: Safety/medical concerns Max wheelchair distance: 125 Assist Level: Moderate assistance (Pt 50 - 74%) Wheel 50 feet with 2 turns activity did not occur: Safety/medical concerns Assist Level: Moderate assistance (Pt 50 - 74%) Wheel 150 feet activity did not occur: Safety/medical concerns Turns around,maneuvers to table,bed, and toilet,negotiates 3% grade,maneuvers on rugs and over doorsills: No Function - Locomotion: Ambulation Ambulation activity did not occur: Safety/medical concerns Assistive device: Walker-rolling Max distance: 2f  Assist level: Moderate assist (Pt 50 - 74%) Walk 10 feet activity did not occur: Safety/medical concerns Assist level: Moderate assist (Pt 50 - 74%) Walk 50 feet with 2 turns activity did not occur: Safety/medical concerns Walk 150 feet activity did not occur: Safety/medical concerns Walk 10 feet on uneven surfaces activity did not occur: Safety/medical concerns  Function - Comprehension Comprehension: Auditory Comprehension assist level: Understands basic 50 - 74% of the time/ requires cueing 25 - 49% of the time  Function - Expression Expression: Verbal Expression assistive device: Talk trach valve Expression assist level: Expresses basic 50 - 74% of the time/requires cueing 25 - 49% of the time. Needs to repeat parts of sentences.  Function - Social Interaction Social Interaction assist level: Interacts appropriately 50 - 74% of the time - May be  physically or verbally inappropriate.  Function - Problem Solving Problem solving assist level: Solves basic 25 - 49% of the time - needs direction more than half the time to initiate, plan or complete simple activities  Function - Memory Memory assist level: Recognizes or recalls 50 - 74% of the time/requires cueing 25 - 49% of the time Patient normally able to recall (  first 3 days only): None of the above  Medical Problem List and Plan: 1. 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 Formulized individual treatment plan based on medical history, underlying problem and comorbidities. May benefit from resp muscle strengthening prior to aggressive swallow retraining  2. DVT Prophylaxis/Anticoagulation: Pharmaceutical: Other (comment)--Eliquis d/ced due to increase in bloody secretions, off IV heparin, continue baby aspirin, 3. Pain Management: tylenol prn pain. Question muscle spasms--will monitor 4. Mood: LCSW to follow for evaluation as mentation improves.  5. Neuropsych: This patient is notcapable of making decisions on hisown behalf. 6. Skin/Wound Care: routine pressure relief measures.  7. Fluids/Electrolytes/Nutrition: Monitor I/O and adjust fluid boluses as needed. Continue tube feeds.  BMP Latest Ref Rng & Units 07/30/2016 07/23/2016 07/20/2016  Glucose 65 - 99 mg/dL 101(H) 128(H) 108(H)  BUN 6 - 20 mg/dL 14 21(H) 27(H)  Creatinine 0.61 - 1.24 mg/dL 0.75 0.76 0.76  Sodium 135 - 145 mmol/L 138 136 139  Potassium 3.5 - 5.1 mmol/L 3.8 4.2 3.9  Chloride 101 - 111 mmol/L 99(L) 99(L) 104  CO2 22 - 32 mmol/L 32 33(H) 30  Calcium 8.9 - 10.3 mg/dL 9.1 8.9 9.1  . 8. 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 Tolerated PMV on #6 trach  , cont scopolamine patch, no evidence of sedation, secretions .9. Parkinson's disease: Continue Sinemet 37.5/150 qid.  10. Leucocytosis: Resolved 11. ABLA: Continue to monitor-no acute drops, Stool guaic neg CBC Latest Ref Rng & Units 08/01/2016 07/31/2016 07/30/2016  WBC 4.0 - 10.5 K/uL 6.3 7.2 6.4  Hemoglobin 13.0 - 17.0 g/dL 8.9(L) 9.1(L) 8.9(L)  Hematocrit 39.0 - 52.0 % 29.5(L) 29.7(L) 29.6(L)  Platelets 150 - 400 K/uL 288 305 285     12. Hematuria: resolved , Hemoglobin steady-           13. Severe dysphagia: NPO till mentation improv  tolerating PEG feedings, Some therapeutic feeds with speech therapy, would first see if patient gets decannulated prior to scheduling MBS   LOS (Days) [place] 08/01/2016, 6:19 AM

## 2016-08-01 NOTE — Progress Notes (Signed)
Speech Language Pathology Daily Session Note  Patient Details  Name: Gregory [name] MRN: [id number] Date of Birth: 1940/09/01  Today's Date: 08/01/2016 SLP Individual Time: 0830-0900 SLP Individual Time Calculation (min): 30 min  Short Term Goals: Week 2: SLP Short Term Goal 1 (Week 2): Patient will consume trials of nectar-thick liquids via tsp with minimal overt s/s of aspiration to assess readiness for repeat objective swallow assessment.  SLP Short Term Goal 2 (Week 2): Patient will increase speech intelligibility to ~75% at the phrase level with Mod A multimodal cues for use of speech intelligibility strategies.  SLP Short Term Goal 3 (Week 2): Pt will demonstrate sustained attention to a functional task for ~ 2 minutes with Mod A verbal cues. for redirection to task.  SLP Short Term Goal 4 (Week 2): Pt will tolerate PMSV for during all waking hours with full supervision without decline in vitals.  SLP Short Term Goal 5 (Week 2): Pt will complete basic familiar tasks (related to ADLs, brushing hair, oral care) with Mod A verbal cues.   Skilled Therapeutic Interventions: Skilled treatment session focused on addressing dysphagia and cognition goals. SLP facilitated session by providing Total assist for completion of oral care.  Patient able to cough and orally hold secretions until SLP orally suctioned him to remove secretions.  Trials of honey-thick liquids and nectar-thick liquids resulted in no overt s/s of aspiration and patient benefited from Min verbal cues to complete hard effortful swallows.  Patient also required Max assist verbal cues to attend to PO trials and refrain from talking.  Appears that patient is approaching readiness for an objective assessment.  Continue with current plan of care.    Function:  Eating Eating   Modified Consistency Diet: Yes (trials with SLP) Eating Assist Level: Helper feeds patient;Set up assist for   Eating Set Up Assist For: Opening  containers       Cognition Comprehension Comprehension assist level: Understands basic 50 - 74% of the time/ requires cueing 25 - 49% of the time  Expression Expression assistive device: Talk trach valve Expression assist level: Expresses basic 50 - 74% of the time/requires cueing 25 - 49% of the time. Needs to repeat parts of sentences.  Social Interaction Social Interaction assist level: Interacts appropriately 50 - 74% of the time - May be physically or verbally inappropriate.  Problem Solving Problem solving assist level: Solves basic 25 - 49% of the time - needs direction more than half the time to initiate, plan or complete simple activities  Memory Memory assist level: Recognizes or recalls 25 - 49% of the time/requires cueing 50 - 75% of the time    Pain Pain Assessment Pain Assessment: No/denies pain  Therapy/Group: Individual Therapy  Gregory [name], M.A., Gregory [name] [phone]  Gregory [name] 08/01/2016, 9:31 AM

## 2016-08-01 NOTE — Progress Notes (Signed)
RT came to cap patient's trach and patient is in physical therapy. RT will check back shortly.
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
## 2016-08-02 ENCOUNTER — Inpatient Hospital Stay (HOSPITAL_COMMUNITY): Payer: Medicare Other | Admitting: Speech Pathology

## 2016-08-02 ENCOUNTER — Inpatient Hospital Stay (HOSPITAL_COMMUNITY): Payer: Medicare Other | Admitting: Physical Therapy

## 2016-08-02 ENCOUNTER — Inpatient Hospital Stay (HOSPITAL_COMMUNITY): Payer: Medicare Other | Admitting: Occupational Therapy

## 2016-08-02 LAB — CBC
HCT: 30.1 % — ABNORMAL LOW (ref 39.0–52.0)
Hemoglobin: 9.1 g/dL — ABNORMAL LOW (ref 13.0–17.0)
MCH: 26.8 pg (ref 26.0–34.0)
MCHC: 30.2 g/dL (ref 30.0–36.0)
MCV: 88.8 fL (ref 78.0–100.0)
PLATELETS: 282 10*3/uL (ref 150–400)
RBC: 3.39 MIL/uL — AB (ref 4.22–5.81)
RDW: 16.8 % — AB (ref 11.5–15.5)
WBC: 6.3 10*3/uL (ref 4.0–10.5)

## 2016-08-02 LAB — GLUCOSE, CAPILLARY
GLUCOSE-CAPILLARY: 123 mg/dL — AB (ref 65–99)
GLUCOSE-CAPILLARY: 89 mg/dL (ref 65–99)
Glucose-Capillary: 95 mg/dL (ref 65–99)
Glucose-Capillary: 100 mg/dL — ABNORMAL HIGH (ref 65–99)

## 2016-08-02 NOTE — Progress Notes (Signed)
Found pt trach capped, per RN: PA capped trach earlier today.  Per discussion RN had w/ PA, plans for capping trach during day and ATC at night.  No distress noted, VSS currently.

## 2016-08-02 NOTE — Progress Notes (Signed)
Pt requested to return to bed. Trach care performed and pt returned to trach collar. Pt's O2 sats are stable and patient resting comfortably in bed. Will continue to monitor.

## 2016-08-02 NOTE — Progress Notes (Signed)
Pt asleep; De-sated to 75% capped, sustained. No distress.  Cap removed and trach collar replaced. Up to 93% within 2 minutes. Remained at 98-100%.  Marissa NestlePam Love PAC made aware.

## 2016-08-02 NOTE — Progress Notes (Signed)
Physical Therapy Session Note  Patient Details  Name: Gregory [name] MRN: [id number] Date of Birth: 07-29-1940  Today's Date: 08/02/2016 PT Individual Time: 1300-1415 PT Individual Time Calculation (min): 75 min   Short Term Goals: Week 2:  PT Short Term Goal 1 (Week 2): Pt will performed WC mobility x 150f with supervision assist from PT PT Short Term Goal 2 (Week 2): Pt will ascend 4 steps(3") with mod assist  PT Short Term Goal 3 (Week 2): Pt will perform bed mobility with min assist  PT Short Term Goal 4 (Week 2): pt will ambulate 536fwith mod assist  PT Short Term Goal 5 (Week 2): pt will perform stand pivot transfer with consistent mod assist   Skilled Therapeutic Interventions/Progress Updates:   .Pt received sitting in WC and agreeable to PT. Patient transferred to Manual WC from TIS with max assist for prevent posterior LOB. WC mobility through hall x [id number] use of all 4 extremities for propulsion;  min assist from PT and max cues for increased use of R UE to prevent hitting obstacles on the R. PT instructed patient in sit<>stand transfer training x 15 throughout treatment with mod assist progressing min assist. Max cues for proper use placement to improve anterior weight shift and increased WB over the forefoot. Standing balance without UE support completed x 6 with mod assist progressing to supervision assist. Pt required to use restroom for bowel movement. toilet transfer with mod assist from PT. PT required to perform perineal hygiene while pt stood with supervision assist. While standing at toilet, patient able to pick washcloth of floor with min assist. Transfer back to [facility] with min assist.  Patient returned to room and left sitting in WC [facility] reach and all needs met.           Therapy Documentation Precautions:  Precautions Precautions: Fall Precaution Comments: B wrist restraints Restrictions Weight Bearing Restrictions:  No Vital Signs: Therapy Vitals Pulse Rate: 90 Resp: 16 Patient Position (if appropriate): Sitting Oxygen Therapy SpO2: 100 % O2 Device: Tracheostomy Collar O2 Flow Rate (L/min): 6 L/min FiO2 (%): 28 % Pain:   0/10  See Function Navigator for Current Functional Status.   Therapy/Group: Individual Therapy  AusLorie Phenix15/2018, 2:19 PM
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
## 2016-08-02 NOTE — Progress Notes (Signed)
Pt capped per instructions from Delle ReiningPamela Love, [place]. Pt resting in chair with family at bedside. Pt is being monitored with continuous pulse oximetry. Pt O2 sats are stable in high 90's. Will continue to monitor.

## 2016-08-02 NOTE — Progress Notes (Signed)
No longer receiving IV meds.  May we remove the PICC?   Thanks.    IV Team

## 2016-08-02 NOTE — Plan of Care (Signed)
Problem: RH BLADDER ELIMINATION Goal: RH STG MANAGE BLADDER WITH EQUIPMENT WITH ASSISTANCE STG Manage Bladder With Equipment With mod Assistance  Outcome: Not Progressing Foley cath
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
## 2016-08-02 NOTE — Progress Notes (Signed)
Speech Language Pathology Daily Session Note  Patient Details  Name: Gregory [name] MRN: [id number] Date of Birth: March 28, 1941  Today's Date: 08/02/2016   Treatment Session #1 SLP Individual Time: 0830-0900 SLP Individual Time Calculation (min): 30 min   Treatment Session #2 SLP Individual Time: 1130-1200 SLP Individual Time Calculation (min): 30 min  Short Term Goals: Week 2: SLP Short Term Goal 1 (Week 2): Patient will consume trials of nectar-thick liquids via tsp with minimal overt s/s of aspiration to assess readiness for repeat objective swallow assessment.  SLP Short Term Goal 2 (Week 2): Patient will increase speech intelligibility to ~75% at the phrase level with Mod A multimodal cues for use of speech intelligibility strategies.  SLP Short Term Goal 3 (Week 2): Pt will demonstrate sustained attention to a functional task for ~ 2 minutes with Mod A verbal cues. for redirection to task.  SLP Short Term Goal 4 (Week 2): Pt will tolerate PMSV for during all waking hours with full supervision without decline in vitals.  SLP Short Term Goal 5 (Week 2): Pt will complete basic familiar tasks (related to ADLs, brushing hair, oral care) with Mod A verbal cues.   Skilled Therapeutic Interventions:     Treatment Session #1: Skilled treatment session focused on   Function:  Eating Eating   Modified Consistency Diet: Yes Eating Assist Level: Helper feeds patient;Set up assist for           Cognition Comprehension Comprehension assist level: Understands basic 50 - 74% of the time/ requires cueing 25 - 49% of the time  Expression Expression assistive device: Talk trach valve Expression assist level: Expresses basic 75 - 89% of the time/requires cueing 10 - 24% of the time. Needs helper to occlude trach/needs to repeat words.  Social Interaction Social Interaction assist level: Interacts appropriately 50 - 74% of the time - May be physically or verbally inappropriate.  Problem  Solving Problem solving assist level: Solves basic 50 - 74% of the time/requires cueing 25 - 49% of the time  Memory Memory assist level: Recognizes or recalls 25 - 49% of the time/requires cueing 50 - 75% of the time    Pain Pain Assessment Pain Assessment: No/denies pain  Therapy/Group: Individual Therapy   Talayah Picardi B. Dreama Saaverton, M.S., CCC-SLP Speech-Language Pathologist   Onyx Schirmer 08/02/2016, 12:54 PM

## 2016-08-02 NOTE — Progress Notes (Signed)
Occupational Therapy Session Note  Patient Details  Name: Gregory [name] MRN: [id number] Date of Birth: 08/27/1940  Today's Date: 08/02/2016 OT Individual Time: 1001-1105 OT Individual Time Calculation (min): 64 min    Short Term Goals: Week 2:  OT Short Term Goal 1 (Week 2): Pt will complete UB dressing sitting unsupported with supervision for 2 consecutive sessions.  OT Short Term Goal 2 (Week 2): pt will complete LB bathing sit to stand with min assist 2 consecutive sessions.  OT Short Term Goal 3 (Week 2): Pt will complete LB dressing sit to stand with mod assist for 2 consecutive sessions.  OT Short Term Goal 4 (Week 2): Pt will complete stand pivot transfers to the [facility] with mod assist for toileting tasks.  OT Short Term Goal 5 (Week 2): Pt will maintain standing with min guard assist while completing 2-3 grooming tasks.    Skilled Therapeutic Interventions/Progress Updates:    Pt completed bathing and dressing sit to stand on the EOB.  Mod assist for crossing the LEs over the knees and maintain for bathing and dressing tasks.  He demonstrates increased posterior lean in sitting as well as with sit to stand.  Max assist for sit to stand with LB selfcare secondary to decreased forward weightshift.  Increased LOB to the left as well when attempting to cross the LEs as well.  Max assist for transfer to the tilt in space wheelchair with use of the RW this session.  Max demonstrational cueing for sequencing transfer including correct use of the RW.  Pt continues to demonstrate decreased orientation stating it was "Sunday".  He also stated [place]  Pt left in wheelchair with family present at end of session with only safety belt donned.  Oxygen sats maintained 96-99% on room air with PMV in place throughout session.    Therapy Documentation Precautions:  Precautions Precautions: Fall Precaution Comments: B wrist restraints Restrictions Weight Bearing Restrictions:  No  Vital Signs: Therapy Vitals Pulse Rate: 90 Resp: 16 Patient Position (if appropriate): Sitting Oxygen Therapy SpO2: 100 % O2 Device: Tracheostomy Collar O2 Flow Rate (L/min): 6 L/min FiO2 (%): 28 % Pain: Pain Assessment Pain Assessment: No/denies pain ADL: See Function Navigator for Current Functional Status.   Therapy/Group: Individual Therapy  Eusebio Blazejewski OTR/L 08/02/2016, 12:32 PM

## 2016-08-03 ENCOUNTER — Inpatient Hospital Stay (HOSPITAL_COMMUNITY): Payer: Medicare Other | Admitting: Physical Therapy

## 2016-08-03 ENCOUNTER — Inpatient Hospital Stay (HOSPITAL_COMMUNITY): Payer: Medicare Other | Admitting: Occupational Therapy

## 2016-08-03 ENCOUNTER — Inpatient Hospital Stay (HOSPITAL_COMMUNITY): Payer: Medicare Other

## 2016-08-03 ENCOUNTER — Inpatient Hospital Stay (HOSPITAL_COMMUNITY): Payer: Medicare Other | Admitting: Speech Pathology

## 2016-08-03 LAB — GLUCOSE, CAPILLARY
GLUCOSE-CAPILLARY: 115 mg/dL — AB (ref 65–99)
Glucose-Capillary: 100 mg/dL — ABNORMAL HIGH (ref 65–99)
Glucose-Capillary: 87 mg/dL (ref 65–99)

## 2016-08-03 LAB — CBC
HEMATOCRIT: 34.3 % — AB (ref 39.0–52.0)
Hemoglobin: 10.5 g/dL — ABNORMAL LOW (ref 13.0–17.0)
MCH: 27.7 pg (ref 26.0–34.0)
MCHC: 30.6 g/dL (ref 30.0–36.0)
MCV: 90.5 fL (ref 78.0–100.0)
Platelets: 318 10*3/uL (ref 150–400)
RBC: 3.79 MIL/uL — ABNORMAL LOW (ref 4.22–5.81)
RDW: 17.2 % — AB (ref 11.5–15.5)
WBC: 9.4 10*3/uL (ref 4.0–10.5)

## 2016-08-03 NOTE — Plan of Care (Signed)
Problem: RH BLADDER ELIMINATION Goal: RH STG MANAGE BLADDER WITH EQUIPMENT WITH ASSISTANCE STG Manage Bladder With Equipment With mod Assistance  Outcome: Not Progressing Foley cath in place for urinary retention

## 2016-08-03 NOTE — Progress Notes (Signed)
Occupational Therapy Weekly Progress Note  Patient Details  Name: Gregory [name] MRN: [id number] Date of Birth: 03/15/1941  Beginning of progress report period: July 27, 2016 End of progress report period: August 03, 2016  Today's Date: 08/03/2016 OT Individual Time: 1005-1107 OT Individual Time Calculation (min): 62 min    Patient has met 1 of 5 short term goals.  Pt has made slower progress this week with OT compared to last week.  He currently still needs mod assist for UB dressing with supervision for bathing, sitting unsupported sitting EOB.  Mod assist is needed for LB bathing with max assist for dressing.  Pt continues to demonstrate decreased flexibility in his trunk as well his hips, making it difficult for reaching his feet or to  cross one LE over the opposite knee.  Mr. Huttner continues to demonstrate decreased forward trunk flexion with decreased timing of hip and knee extension.  In standing he continues to demonstrate significant posterior lean with decreased ability to let go with one UE to assist with peri washing or clothing management secondary to LOB.  Mr. Dillenbeck continues to exhibit decreased selective attention at times as well as decreased memory.  He needs mod instructional cueing to remember what parts he has already washed and at times will get distracted with his own conversation and forget what he is working on.  Family continues to be present for most sessions and they continue to provide encouragement for his progress.  He continues to make progress with endurance, currently tolerating a plugged trach with O2 sats staying higher than 95%.  Recommend continued CIR level OT at this time in order to progress to min assist level for discharge home.      Patient continues to demonstrate the following deficits: muscle weakness, impaired timing and sequencing and decreased coordination, decreased attention, decreased awareness, decreased problem solving, decreased safety  awareness, decreased memory and delayed processing and decreased sitting balance, decreased standing balance, decreased postural control and decreased balance strategies and therefore will continue to benefit from skilled OT intervention to enhance overall performance with BADL.  Patient progressing toward long term goals..  Continue plan of care.  OT Short Term Goals Week 3:  OT Short Term Goal 1 (Week 3): Pt will complete UB dressing sitting unsupported with supervision for 2 consecutive sessions.  OT Short Term Goal 2 (Week 3): pt will complete LB bathing sit to stand with min assist 2 consecutive sessions.  OT Short Term Goal 3 (Week 3): Pt will complete LB dressing sit to stand with mod assist for 2 consecutive sessions.  OT Short Term Goal 4 (Week 3): Pt will maintain standing with min guard assist while completing 2-3 grooming tasks.    Skilled Therapeutic Interventions/Progress Updates:    Pt completed bathing and dressing EOB during session.  Mod assist for transfer from supine to sitting EOB.  Close supervision for static sitting balance while completing UB bathing.  Min assist for balance while crossing LEs when attempting to wash his feet. Mod assist for sit to stand from the EOB with increased posterior lean noted in standing.  Mod assist for initial standing balance but progressed to min assist level after 45 secs to 1 min.  Pt needing mod instructional cueing overall for sequencing bathing secondary to decreased memory and decreased selective attention to the task.  Pt with increased lean and LOB to the left when attempting to cross the LLE over the right to donn shorts and socks.  Max assist  needed for both of these tasks secondary to not being able to bring his LEs up far enough to complete donning them this session.  Mod assist for standing to pull the shorts over his hips to complete dressing.  Pt transferred to the wheelchair for completion of session with mod assist.  He attempted to  sit down too early and ended up on the right side of the wheelchair.  Mod assist to scoot his hips over to the left of the wheelchair.  Pt left in wheelchair with daughter present in room and safety belt in place.    Therapy Documentation Precautions:  Precautions Precautions: Fall Precaution Comments: B wrist restraints Restrictions Weight Bearing Restrictions: No  Vital Signs: Therapy Vitals Temp: 97.9 F (36.6 C) Temp Source: Oral Pulse Rate: 65 Resp: 18 BP: (!) 109/54 Patient Position (if appropriate): Sitting Oxygen Therapy SpO2: 97 % O2 Device: Not Delivered Pain: No report of pain See Function Navigator for Current Functional Status.   Therapy/Group: Individual Therapy  Sylar Voong OTR/L 08/03/2016, 4:14 PM

## 2016-08-03 NOTE — Progress Notes (Signed)
Speech Language Pathology Daily Session Note  Patient Details  Name: Gregory [name] MRN: [id number] Date of Birth: 01-12-1941  Today's Date: 08/03/2016 SLP Individual Time: 0945-1000 SLP Individual Time Calculation (min): 15 min  Short Term Goals: Week 3: SLP Short Term Goal 1 (Week 3): Patient will consume trials of nectar-thick liquids via tsp without overt s/s of aspiration with Mod A multimodal cues for use of compensatory swallow strategies.  SLP Short Term Goal 2 (Week 3): Patient will increase speech intelligibility to ~75% at the sentence level with Min A cues for use of speech intelligibility strategies.  SLP Short Term Goal 3 (Week 3): Pt will demonstrate sustained attention to a functional task for ~ 5 minutes with Mod A verbal cues. for redirection to task.  SLP Short Term Goal 4 (Week 3): Pt will complete basic familiar tasks (related to ADLs, brushing hair, oral care) with Mod A verbal cues.   Skilled Therapeutic Interventions: Skilled treatment session focused on dysphagia goals. SLP facilitated the session by providing skilled knowledge of Modified Barium Swallow study, plan of care, least restrictive PO intake. All of daughter's questions answered to satisfaction at this time. Will continue education in next session.      Function:  Eating Eating   Modified Consistency Diet: Yes Eating Assist Level: Helper feeds patient;Set up assist for   Eating Set Up Assist For: Opening containers       Cognition Comprehension Comprehension assist level: Understands basic 50 - 74% of the time/ requires cueing 25 - 49% of the time  Expression Expression assistive device: Talk trach valve Expression assist level: Expresses basic 75 - 89% of the time/requires cueing 10 - 24% of the time. Needs helper to occlude trach/needs to repeat words.  Social Interaction Social Interaction assist level: Interacts appropriately 50 - 74% of the time - May be physically or verbally inappropriate.   Problem Solving Problem solving assist level: Solves basic 25 - 49% of the time - needs direction more than half the time to initiate, plan or complete simple activities  Memory Memory assist level: Recognizes or recalls 25 - 49% of the time/requires cueing 50 - 75% of the time    Pain    Therapy/Group: Individual Therapy  Vora Clover 08/03/2016, 4:55 PM

## 2016-08-03 NOTE — Progress Notes (Signed)
Speech Language Pathology Note  Patient Details  Name: Gregory [name] MRN: [id number] Date of Birth: 01-22-1941 Today's Date: 08/03/2016  MBSS complete, see full report under imaging section.   Gregory [name], M.S., CCC-SLP Speech-Language Pathologist    Gregory [name] 08/03/2016, 12:45 PM

## 2016-08-03 NOTE — Plan of Care (Signed)
Problem: RH BLADDER ELIMINATION Goal: RH STG MANAGE BLADDER WITH EQUIPMENT WITH ASSISTANCE STG Manage Bladder With Equipment With mod Assistance  Outcome: Not Progressing Total assist- foley catheter

## 2016-08-03 NOTE — Progress Notes (Signed)
Subjective/Complaints:  Patient awakens to voice. He is asking for restraints to be removed. We discussed that he still is restless at night and may by mistake knock out his trach. This would need to be reinserted because he does not tolerate  capping at night. Discussed with pulmonology, likely has sleep apnea and would need CPAP if he did not have trach. CPAP compliance questionable due to mental status  ROS: Limited due to cognition  Objective: Vital Signs: Blood pressure (!) 102/46, pulse (!) 56, temperature 97.6 F (36.4 C), temperature source Axillary, resp. rate 19, height 5\' 9"  (1.753 m), weight 80.3 kg (176 lb 15.6 oz), SpO2 100 %. No results found. Results for orders placed or performed during the hospital encounter of 07/19/16 (from the past 72 hour(s))  Glucose, capillary     Status: None   Collection Time: 07/31/16  6:57 AM  Result Value Ref Range   Glucose-Capillary 95 65 - 99 mg/dL  Glucose, capillary     Status: Abnormal   Collection Time: 07/31/16 11:27 AM  Result Value Ref Range   Glucose-Capillary 121 (H) 65 - 99 mg/dL  Occult blood card to lab, stool     Status: None   Collection Time: 07/31/16  3:09 PM  Result Value Ref Range   Fecal Occult Bld NEGATIVE NEGATIVE  Glucose, capillary     Status: None   Collection Time: 07/31/16  4:32 PM  Result Value Ref Range   Glucose-Capillary 82 65 - 99 mg/dL  Glucose, capillary     Status: Abnormal   Collection Time: 07/31/16  8:39 PM  Result Value Ref Range   Glucose-Capillary 117 (H) 65 - 99 mg/dL  CBC     Status: Abnormal   Collection Time: 08/01/16  4:13 AM  Result Value Ref Range   WBC 6.3 4.0 - 10.5 K/uL   RBC 3.31 (L) 4.22 - 5.81 MIL/uL   Hemoglobin 8.9 (L) 13.0 - 17.0 g/dL   HCT 16.129.5 (L) 09.639.0 - 04.552.0 %   MCV 89.1 78.0 - 100.0 fL   MCH 26.9 26.0 - 34.0 pg   MCHC 30.2 30.0 - 36.0 g/dL   RDW 40.916.9 (H) 81.111.5 - 91.415.5 %   Platelets 288 150 - 400 K/uL  Glucose, capillary     Status: None   Collection Time: 08/01/16   6:33 AM  Result Value Ref Range   Glucose-Capillary 94 65 - 99 mg/dL  Glucose, capillary     Status: None   Collection Time: 08/01/16  4:19 PM  Result Value Ref Range   Glucose-Capillary 79 65 - 99 mg/dL  Glucose, capillary     Status: None   Collection Time: 08/01/16  9:08 PM  Result Value Ref Range   Glucose-Capillary 88 65 - 99 mg/dL  CBC     Status: Abnormal   Collection Time: 08/02/16  5:03 AM  Result Value Ref Range   WBC 6.3 4.0 - 10.5 K/uL   RBC 3.39 (L) 4.22 - 5.81 MIL/uL   Hemoglobin 9.1 (L) 13.0 - 17.0 g/dL   HCT 78.230.1 (L) 95.639.0 - 21.352.0 %   MCV 88.8 78.0 - 100.0 fL   MCH 26.8 26.0 - 34.0 pg   MCHC 30.2 30.0 - 36.0 g/dL   RDW 08.616.8 (H) 57.811.5 - 46.915.5 %   Platelets 282 150 - 400 K/uL  Glucose, capillary     Status: None   Collection Time: 08/02/16  6:51 AM  Result Value Ref Range   Glucose-Capillary 95 65 -  99 mg/dL  Glucose, capillary     Status: Abnormal   Collection Time: 08/02/16 11:24 AM  Result Value Ref Range   Glucose-Capillary 100 (H) 65 - 99 mg/dL  Glucose, capillary     Status: Abnormal   Collection Time: 08/02/16  4:46 PM  Result Value Ref Range   Glucose-Capillary 123 (H) 65 - 99 mg/dL  Glucose, capillary     Status: None   Collection Time: 08/02/16  8:38 PM  Result Value Ref Range   Glucose-Capillary 89 65 - 99 mg/dL     HEENT: Normocephalic. Healing trauma Neck: #6 shiley, significant secretions, white thick, no bloody streaks Cardio: RRR. No JVD.  Resp: Ronchi and Unlabored GI: BS positive and ND, PEG site clean, dry and intact. Nontender to palpation. No drainage  Skin:   Other trach site without bleeding Neuro: Somnolent Sensory cannot assess due to somnolence , Motor: Unable to assess this AM due to somnolence  Musc/Skel:  No edema. No tenderness.  Gen NAD, Vital signs reviewed.  GU Foley bag .golden colored urine  Assessment/Plan: 1. Functional deficits secondary to bilateral cortical and subcortical cardioembolic infarcts which require 3+  hours per day of interdisciplinary therapy in a comprehensive inpatient rehab setting. Physiatrist is providing close team supervision and 24 hour management of active medical problems listed below. Physiatrist and rehab team continue to assess barriers to discharge/monitor patient progress toward functional and medical goals. FIM: Function - Bathing Position: Sitting EOB Body parts bathed by patient: Right arm, Left arm, Chest, Abdomen, Right upper leg, Left upper leg Body parts bathed by helper: Right lower leg, Left lower leg, Buttocks, Front perineal area, Back Bathing not applicable: Front perineal area, Buttocks Assist Level: Touching or steadying assistance(Pt > 75%)  Function- Upper Body Dressing/Undressing What is the patient wearing?: Pull over shirt/dress Pull over shirt/dress - Perfomed by patient: Thread/unthread right sleeve, Thread/unthread left sleeve Pull over shirt/dress - Perfomed by helper: Put head through opening, Pull shirt over trunk Assist Level: Supervision or verbal cues Function - Lower Body Dressing/Undressing What is the patient wearing?: Pants, Socks, Shoes Position: Sitting EOB Pants- Performed by patient: Thread/unthread right pants leg Pants- Performed by helper: Thread/unthread right pants leg, Thread/unthread left pants leg, Pull pants up/down Non-skid slipper socks- Performed by patient: Don/doff right sock, Don/doff left sock Non-skid slipper socks- Performed by helper: Don/doff right sock, Don/doff left sock Socks - Performed by patient: Don/doff left sock Socks - Performed by helper: Don/doff right sock Shoes - Performed by helper: Don/doff right shoe, Don/doff left shoe, Fasten right, Fasten left Assist for footwear: Maximal assist Assist for lower body dressing: Touching or steadying assistance (Pt > 75%) (Max A)  Function - Toileting Toileting activity did not occur: No continent bowel/bladder event Toileting steps completed by patient: Adjust  clothing after toileting Toileting steps completed by helper: Adjust clothing prior to toileting, Performs perineal hygiene, Adjust clothing after toileting Toileting Assistive Devices: Grab bar or rail Assist level: More than reasonable time, Two helpers  Function - Archivist transfer activity did not occur: Safety/medical concerns Toilet transfer assistive device: Elevated toilet seat/BSC over toilet, Mechanical lift Mechanical lift: Stedy Assist level to toilet: Moderate assist (Pt 50 - 74%/lift or lower) Assist level from toilet: Moderate assist (Pt 50 - 74%/lift or lower) Assist level to bedside commode (at bedside): 2 helpers Assist level from bedside commode (at bedside): 2 helpers  Function - Chair/bed transfer Chair/bed transfer method: Stand pivot Chair/bed transfer assist level: Moderate assist (  Pt 50 - 74%/lift or lower) Chair/bed transfer assistive device: Armrests, Walker Mechanical lift: Stedy Chair/bed transfer details: Verbal cues for technique, Tactile cues for posture, Verbal cues for sequencing, Manual facilitation for weight shifting, Manual facilitation for placement  Function - Locomotion: Wheelchair Will patient use wheelchair at discharge?: Yes Type: Manual Wheelchair activity did not occur: Safety/medical concerns Max wheelchair distance: 173ft  Assist Level: Touching or steadying assistance (Pt > 75%) Wheel 50 feet with 2 turns activity did not occur: Safety/medical concerns Assist Level: Touching or steadying assistance (Pt > 75%) Wheel 150 feet activity did not occur: Safety/medical concerns Assist Level: Touching or steadying assistance (Pt > 75%) Turns around,maneuvers to table,bed, and toilet,negotiates 3% grade,maneuvers on rugs and over doorsills: No Function - Locomotion: Ambulation Ambulation activity did not occur: Safety/medical concerns Assistive device: Walker-rolling Max distance: 88ft  Assist level: Touching or steadying  assistance (Pt > 75%) Walk 10 feet activity did not occur: Safety/medical concerns Assist level: Touching or steadying assistance (Pt > 75%) Walk 50 feet with 2 turns activity did not occur: Safety/medical concerns Assist level: Touching or steadying assistance (Pt > 75%) Walk 150 feet activity did not occur: Safety/medical concerns Walk 10 feet on uneven surfaces activity did not occur: Safety/medical concerns  Function - Comprehension Comprehension: Auditory Comprehension assist level: Understands basic 50 - 74% of the time/ requires cueing 25 - 49% of the time  Function - Expression Expression: Verbal Expression assistive device: Talk trach valve Expression assist level: Expresses basic 75 - 89% of the time/requires cueing 10 - 24% of the time. Needs helper to occlude trach/needs to repeat words.  Function - Social Interaction Social Interaction assist level: Interacts appropriately 50 - 74% of the time - May be physically or verbally inappropriate.  Function - Problem Solving Problem solving assist level: Solves basic 25 - 49% of the time - needs direction more than half the time to initiate, plan or complete simple activities  Function - Memory Memory assist level: Recognizes or recalls 25 - 49% of the time/requires cueing 50 - 75% of the time Patient normally able to recall (first 3 days only): Current season, That he or she is in a hospital, Staff names and faces  Medical Problem List and Plan: 1. 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 DVT Prophylaxis/Anticoagulation: Pharmaceutical: Other (comment)-- heparin, warfarin, or DOAC cause hematuria and hemoptysis  continue baby aspirin, 3. Pain Management: tylenol prn pain. Question muscle spasms--will monitor 4. Mood: LCSW to follow for evaluation as mentation  improves.  5. Neuropsych: This patient is notcapable of making decisions on hisown behalf. 6. Skin/Wound Care: routine pressure relief measures.  7. Fluids/Electrolytes/Nutrition: Monitor I/O and adjust fluid boluses as needed. Continue tube feeds.  BMP Latest Ref Rng & Units 07/30/2016 07/23/2016 07/20/2016  Glucose 65 - 99 mg/dL 696(E) 952(W) 413(K)  BUN 6 - 20 mg/dL 14 44(W) 10(U)  Creatinine 0.61 - 1.24 mg/dL 7.25 3.66 4.40  Sodium 135 - 145 mmol/L 138 136 139  Potassium 3.5 - 5.1 mmol/L 3.8 4.2 3.9  Chloride 101 - 111 mmol/L 99(L) 99(L) 104  CO2 22 - 32 mmol/L 32 33(H) 30  Calcium 8.9 - 10.3 mg/dL 9.1 8.9 9.1  . 8. 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 Parkinson's disease: Continue Sinemet 37.5/150 qid.  10. Leucocytosis: Resolved 11. ABLA: Continue to monitor-no acute drops, Stool guaic neg CBC Latest Ref Rng & Units 08/02/2016 08/01/2016 07/31/2016  WBC 4.0 - 10.5 K/uL 6.3 6.3 7.2  Hemoglobin 13.0 - 17.0 g/dL 1.6(X) 8.9(L) 9.1(L)  Hematocrit 39.0 - 52.0 % 30.1(L) 29.5(L) 29.7(L)  Platelets 150 - 400 K/uL 282 288 305     12. Hematuria: resolved , Hemoglobin steady-           13. Severe dysphagia: NPO till mentation improv  tolerating PEG feedings, Some therapeutic feeds with speech therapy, recommend pt gets downsized prior to scheduling MBS   LOS (Days) 15 A FACE TO FACE EVALUATION WAS PERFORMED  David Rodriquez E 08/03/2016, 6:27 AM

## 2016-08-03 NOTE — Progress Notes (Signed)
Speech Language Pathology Weekly Progress and Session Note  Patient Details  Name: Gregory [name] MRN: [id number] Date of Birth: 04-Apr-1941  Beginning of progress report period: July 27, 2016 End of progress report period: August 03, 2016  Today's Date: 08/03/2016 SLP Individual Time: 1500-1530 SLP Individual Time Calculation (min): 30 min  Short Term Goals: Week 2: SLP Short Term Goal 1 (Week 2): Patient will consume trials of nectar-thick liquids via tsp with minimal overt s/s of aspiration to assess readiness for repeat objective swallow assessment.  SLP Short Term Goal 1 - Progress (Week 2): Met SLP Short Term Goal 2 (Week 2): Patient will increase speech intelligibility to ~75% at the phrase level with Mod A multimodal cues for use of speech intelligibility strategies.  SLP Short Term Goal 2 - Progress (Week 2): Met SLP Short Term Goal 3 (Week 2): Pt will demonstrate sustained attention to a functional task for ~ 2 minutes with Mod A verbal cues. for redirection to task.  SLP Short Term Goal 3 - Progress (Week 2): Met SLP Short Term Goal 4 (Week 2): Pt will tolerate PMSV for during all waking hours with full supervision without decline in vitals.  SLP Short Term Goal 4 - Progress (Week 2): Met SLP Short Term Goal 5 (Week 2): Pt will complete basic familiar tasks (related to ADLs, brushing hair, oral care) with Mod A verbal cues.  SLP Short Term Goal 5 - Progress (Week 2): Not met    New Short Term Goals: Week 3: SLP Short Term Goal 1 (Week 3): Patient will consume trials of nectar-thick liquids via tsp without overt s/s of aspiration with Mod A multimodal cues for use of compensatory swallow strategies.  SLP Short Term Goal 2 (Week 3): Patient will increase speech intelligibility to ~75% at the sentence level with Min A cues for use of speech intelligibility strategies.  SLP Short Term Goal 3 (Week 3): Pt will demonstrate sustained attention to a functional task for ~ 5 minutes with  Mod A verbal cues. for redirection to task.  SLP Short Term Goal 4 (Week 3): Pt will complete basic familiar tasks (related to ADLs, brushing hair, oral care) with Mod A verbal cues.   Weekly Progress Updates: Pt with steady progress this reporting period as evidenced by meeting 5 of 6 STGs. Pt with progress in toleration of PMSV, consuming nectar thick liquids, speech intelligibility and sustained attention. Pt continues ot require Max A for use of compensatory swallow strategies when consuming nectar thick liquids, sustaining attention to task  for longer than 5 minutes and for basic problem solving tasks. Pt requires skilled ST to target these areas to increase functional independence and decrease caregiver burden prior to discharge.      Intensity: Minumum of 1-2 x/day, 30 to 90 minutes Frequency: 3 to 5 out of 7 days Duration/Length of Stay: 08/17/2016 Treatment/Interventions: Cognitive remediation/compensation;Cueing hierarchy;Dysphagia/aspiration precaution training;Environmental controls;Therapeutic Activities;Internal/external aids;Speech/Language facilitation;Functional tasks;Patient/family education   Daily Session  Skilled Therapeutic Interventions: Skilled treatment session focused on dysphagia and cognition goals. SLP facilitated the session by providing training to daughter on POC for pt to have 1 (4oz) container of nectar thick liquids in the morning and 1 (4oz) container of nectar thick liquids in the afternoon, after oral care and under full supervision of family with Max A cues for use of compensatory swallow strategies (chin tuck, followed by throat clear and reswallow). PMSV or cap must be in place and pt must be alert. Pt requires Mod A  verbal cues for sustained attention to task for ~3 minutes and Max A verbal cues for use of compensatory swallow strategies. PA agreeable to this POC of care. Will need to assess with full tray of nectar thick liquids prior to further upgrade. Pt  was left upright in wheelchair, safety belt donned, daughter present and all needs within reach. Continue current plan of care.    Function:   Eating Eating Eating activity did not occur: Safety/medical concerns Modified Consistency Diet: Yes Eating Assist Level: Helper feeds patient;Set up assist for   Eating Set Up Assist For: Opening containers       Cognition Comprehension Comprehension assist level: Understands basic 50 - 74% of the time/ requires cueing 25 - 49% of the time  Expression Expression assistive device: Talk trach valve Expression assist level: Expresses basic 75 - 89% of the time/requires cueing 10 - 24% of the time. Needs helper to occlude trach/needs to repeat words.  Social Interaction Social Interaction assist level: Interacts appropriately 50 - 74% of the time - May be physically or verbally inappropriate.  Problem Solving Problem solving assist level: Solves basic 25 - 49% of the time - needs direction more than half the time to initiate, plan or complete simple activities  Memory Memory assist level: Recognizes or recalls 25 - 49% of the time/requires cueing 50 - 75% of the time   General    Pain Pain Assessment Pain Assessment: No/denies pain  Therapy/Group: Individual Therapy    B. Rutherford Nail, M.S., CCC-SLP Speech-Language Pathologist     08/03/2016, 1:20 PM

## 2016-08-04 ENCOUNTER — Inpatient Hospital Stay (HOSPITAL_COMMUNITY): Payer: Medicare Other | Admitting: Physical Therapy

## 2016-08-04 DIAGNOSIS — I6310 Cerebral infarction due to embolism of unspecified precerebral artery: Secondary | ICD-10-CM

## 2016-08-04 LAB — CBC
HCT: 31.8 % — ABNORMAL LOW (ref 39.0–52.0)
Hemoglobin: 9.7 g/dL — ABNORMAL LOW (ref 13.0–17.0)
MCH: 27.4 pg (ref 26.0–34.0)
MCHC: 30.5 g/dL (ref 30.0–36.0)
MCV: 89.8 fL (ref 78.0–100.0)
PLATELETS: 300 10*3/uL (ref 150–400)
RBC: 3.54 MIL/uL — AB (ref 4.22–5.81)
RDW: 17 % — ABNORMAL HIGH (ref 11.5–15.5)
WBC: 6.2 10*3/uL (ref 4.0–10.5)

## 2016-08-04 LAB — GLUCOSE, CAPILLARY
GLUCOSE-CAPILLARY: 110 mg/dL — AB (ref 65–99)
GLUCOSE-CAPILLARY: 105 mg/dL — AB (ref 65–99)
Glucose-Capillary: 104 mg/dL — ABNORMAL HIGH (ref 65–99)
Glucose-Capillary: 124 mg/dL — ABNORMAL HIGH (ref 65–99)

## 2016-08-04 NOTE — Progress Notes (Signed)
Physical Therapy Weekly Progress Note  Patient Details  Name: Gregory [name] MRN: [id number] Date of Birth: 06/09/1940  Beginning of progress report period: July 27, 2016 End of progress report period: August 03, 2016  Today's Date: 08/03/2016 PT Individual Time: 1324-4010   PT Individual Time Calculation:    Patient has met 4 of 5 short term goals.  This pt has demonstrated fluctuating progress toward goals depending on fatigue level, requiring, at times, as much as max assist for stand pivot and sit stand transfers to as little as min assist. Motor planning and endurance continue to be this patient's largest limiting factor, with noticeable decline in function when mentally and physically fatigue.   Patient continues to demonstrate the following deficits muscle weakness, decreased cardiorespiratoy endurance and decreased oxygen support, abnormal tone, motor apraxia, decreased coordination and decreased motor planning, decreased attention, decreased awareness, decreased problem solving, decreased safety awareness, decreased memory and delayed processing and decreased standing balance, decreased postural control, hemiplegia and decreased balance strategies and therefore will continue to benefit from skilled PT intervention to increase functional independence with mobility.  Patient progressing toward long term goals..  Continue plan of care.  PT Short Term Goals Week 2:  PT Short Term Goal 1 (Week 2): Pt will performed WC mobility x 150f with supervision assist from PT PT Short Term Goal 1 - Progress (Week 2): Met PT Short Term Goal 2 (Week 2): Pt will ascend 4 steps(3") with mod assist  PT Short Term Goal 2 - Progress (Week 2): Met PT Short Term Goal 3 (Week 2): Pt will perform bed mobility with min assist  PT Short Term Goal 3 - Progress (Week 2): Progressing toward goal PT Short Term Goal 4 (Week 2): pt will ambulate 589fwith mod assist  PT Short Term Goal 4 - Progress (Week 2):  Met PT Short Term Goal 5 (Week 2): pt will perform stand pivot transfer with consistent mod assist  PT Short Term Goal 5 - Progress (Week 2): Met Week 3:  PT Short Term Goal 1 (Week 3): Patient will consistently perform sit<>stand with min assist  PT Short Term Goal 2 (Week 3): Patient will perform stand pivot transfer with min assist using LRAD  PT Short Term Goal 3 (Week 3): Patient will ambulate 10067fith min assist PT Short Term Goal 4 (Week 3): Pt will mainatin stantic standing balance with 1 UE supported and supervision assist up to 3 minutes.  PT Short Term Goal 5 (Week 3): Patient will ascesnd and desence 4, steps ( 6") with min assist   Skilled Therapeutic Interventions/Progress Updates:   Pt received sitting in WC and agreeable to PT  Stand pivot transfer with min assist in bathroom to and from toilet and heavy use of rail. PT required to peform perineal hygiene, but pt able to manage clothing on this day.   [place]ait training performed x 37f40fth min assist from PT and RW. Moderate cues for increased step length to prevent festination and freezing. PT instructed patient in ascent and descent of 12 steps (6" and 3") with min assist on 3 inch, min assist with 6inch ascent and mod assist with 6inch descent' BUE support throughout. Moderate cues for proper step-to gait pattern due to LLE weakness on 6inch steps.   Nustep endurance training x 8 minutes with minutes with 1 rest break. After rest break, pt utilized BLE only to allow increased ROM and  improve dynamic stretch of heel cord and HS.   WC mobility x 152f with supervision assist form PT with max cues for increased size of movement to improve speed with WC propulsion with all 4 extremities.   Throughout treatment pt performed all sit<>stand and stand pivot transfers with min assist and moderate verbal instruction for safety and sequencing.   Pt SpO2 remained > 90% throughout treatment  with trach capped.   [facility] and all needs met.        Therapy Documentation Precautions:  Precautions Precautions: Fall Precaution Comments: B wrist restraints Restrictions Weight Bearing Restrictions: No Vital Signs: Therapy Vitals Temp: 97.9 F (36.6 C) Temp Source: Oral Pulse Rate: 80 Resp: 16 BP: 115/63 Patient Position (if appropriate): Lying Oxygen Therapy SpO2: 100 % O2 Device: Tracheostomy Collar O2 Flow Rate (L/min): 6 L/min FiO2 (%): 28 % Pain: 0/10   See Function Navigator for Current Functional Status.  Therapy/Group: Individual Therapy  ALorie Phenix3/17/2018, 6:10 AM

## 2016-08-04 NOTE — Progress Notes (Signed)
RT called to room to discuss red cap trials with family. Unclear instructions/plan between what family was told and info passed along to RT. RT called pulmonary on call and discussed situation. RT ordered to continue red cap trials as tol. MD note from days prior suggested to cont capping trials during the day and ATC as night to evaluate for decannulation vs downsizing trach to #4 cuffless vs need for long term trach. RT explained this information at length with family and everyone seems to be on same page for now. Pt currently has red plug in trach and tol well, RT suctioned out moderate amount of thick white secretions prior to placing the red cap on trach. RT explained to family that the secretion amount and consistency is still too much and too thick to red cap at all times yet. RT will cont to work towards pt getting decannulated, family expressed concerns of pt not being decannulated due to practitioners not working with him. RT explained that it is our ultimate goal to get the trach out if the patient meets criteria to do so safely. Will cont to monitor.
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
## 2016-08-04 NOTE — Progress Notes (Signed)
Notified Dr. Newt LukesValerie A Leschber, MD of hematuria noted today during assessment, family concerned. No new orders. Will monitor, and notify oncoming RN to notify MD if hematuria becomes gross.

## 2016-08-04 NOTE — Progress Notes (Signed)
Physical Therapy Session Note  Patient Details  Name: Gregory [name] MRN: [id number] Date of Birth: 18-May-1941  Today's Date: 08/04/2016 PT Individual Time: 0800-0905 PT Individual Time Calculation (min): 65 min   Short Term Goals: Week 3:  PT Short Term Goal 1 (Week 3): Patient will consistently perform sit<>stand with min assist  PT Short Term Goal 2 (Week 3): Patient will perform stand pivot transfer with min assist using LRAD  PT Short Term Goal 3 (Week 3): Patient will ambulate [id number] with min assist PT Short Term Goal 4 (Week 3): Pt will mainatin stantic standing balance with 1 UE supported and supervision assist up to 3 minutes.  PT Short Term Goal 5 (Week 3): Patient will ascesnd and desence 4, steps ( 6") with min assist   Skilled Therapeutic Interventions/Progress Updates: Pt presented in bed agreeable to therapy. Performed supine to sit with mod A for truncal support. Pt performed stand pivot to TIS w/c with modA, demonstrating some difficulty initiating LLE however resolved. Pt transported to rehab gym and performed standing static balance activities with RW. Pt performed sit to/from stand with RW with minA and requiring min cues for hand placement at time correcting self. In standing pt demonstrating fair balance with single extremity support. Pt requiring cues for maintaining neutral however able to perform reach high/low and across midline with no LOB noted. Pt able to achieve standing tolerance for bouts up to 4 min before requiring seated rest. Pt returned to room and remained in TIS chair with wrist restraints placed and nsg present.      Therapy Documentation Precautions:  Precautions Precautions: Fall Precaution Comments: B wrist restraints Restrictions Weight Bearing Restrictions: No General:   Vital Signs: Therapy Vitals Pulse Rate: 68 Resp: 18 Patient Position (if appropriate): Sitting Oxygen Therapy SpO2: 96 % O2 Device: Not Delivered O2 Flow Rate (L/min):  6 L/min FiO2 (%): 28 % Pain:     See Function Navigator for Current Functional Status.   Therapy/Group: Individual Therapy  Modell Fendrick  Ervin Hensley, PTA  08/04/2016, 12:45 PM

## 2016-08-04 NOTE — Progress Notes (Signed)
Report to Amber, RN, to assume care of patient at this time.  

## 2016-08-04 NOTE — Progress Notes (Signed)
Patient slept soundly throughout the night.  Sats remained between 95-100%.  No signs of discomfort or distress.  No attempts made to get out of bed.    Kelli HopeBarber, Lynnel Zanetti M

## 2016-08-05 ENCOUNTER — Inpatient Hospital Stay (HOSPITAL_COMMUNITY): Payer: Medicare Other | Admitting: Physical Therapy

## 2016-08-05 ENCOUNTER — Inpatient Hospital Stay (HOSPITAL_COMMUNITY): Payer: Medicare Other

## 2016-08-05 LAB — CBC
HEMATOCRIT: 35.9 % — AB (ref 39.0–52.0)
HEMOGLOBIN: 10.9 g/dL — AB (ref 13.0–17.0)
MCH: 27.3 pg (ref 26.0–34.0)
MCHC: 30.4 g/dL (ref 30.0–36.0)
MCV: 90 fL (ref 78.0–100.0)
Platelets: 358 10*3/uL (ref 150–400)
RBC: 3.99 MIL/uL — ABNORMAL LOW (ref 4.22–5.81)
RDW: 16.9 % — ABNORMAL HIGH (ref 11.5–15.5)
WBC: 7.5 10*3/uL (ref 4.0–10.5)

## 2016-08-05 LAB — GLUCOSE, CAPILLARY
GLUCOSE-CAPILLARY: 96 mg/dL (ref 65–99)
GLUCOSE-CAPILLARY: 115 mg/dL — AB (ref 65–99)
GLUCOSE-CAPILLARY: 81 mg/dL (ref 65–99)

## 2016-08-05 NOTE — Progress Notes (Signed)
Physical Therapy Session Note  Patient Details  Name: Gregory [name] MRN: [id number] Date of Birth: Jan 09, 1941  Today's Date: 08/05/2016 PT Individual Time: [id number] PT Individual Time Calculation (min): 74 min    Skilled Therapeutic Interventions/Progress Updates:  Pt received in w/c & agreeable to tx. Transported pt to rehab gym via w/c total assist. Gait training x 20 ft with RW & min assist for linear paths and mod assist for turning. Pt demonstrates shuffled gait pattern but is able to increase step length for ~2 steps with multimodal cuing before reverting back to shuffled pattern. Pt reports "I'm beat" after being awake since 5 AM and having therapy early this morning. Pt performed standing step taps progressing to standing cone taps with BUE support and max instructional cuing for task. Pt with impaired coordination LLE>RLE. Pt engaged in pipe tree assembly (2 most simple shapes) with max cuing for correct piece selection from choice of many. Pt able to report what piece he needs next but unable to correctly select it. [name] & son-in-law arrived for end of PT session. Pt frequently voicing need to void with education that he has a catheter placed; pt with no carry over of new information. Pt then reports need to have BM & is transported back to room via w/c total assist. Pt ambulated within room/bathroom with RW & mod assist for balance and cuing for gait pattern. Pt required total assist for clothing management & peri-hygiene; pt with continent BM on elevated toilet. At end of session pt left sitting in TIS w/c with family present & in care of RN.  Therapy Documentation Precautions:  Precautions Precautions: Fall Precaution Comments: B wrist restraints Restrictions Weight Bearing Restrictions: No  Vital Signs: SpO2 = 100% on room air  Pain: c/o B knee pain - rest breaks provided PRN.   See Function Navigator for Current Functional Status.   Therapy/Group: Individual  Therapy  Sandi MariscalVictoria M [name] 08/05/2016, 5:25 PM

## 2016-08-05 NOTE — Progress Notes (Signed)
Occupational Therapy Session Note  Patient Details  Name: Gregory [name] MRN: [id number] Date of Birth: 03-08-1941  Today's Date: 08/05/2016 OT Individual Time: 0700-0800 OT Individual Time Calculation (min): 60 min    Short Term Goals: Week 3:  OT Short Term Goal 1 (Week 3): Pt will complete UB dressing sitting unsupported with supervision for 2 consecutive sessions.  OT Short Term Goal 2 (Week 3): pt will complete LB bathing sit to stand with min assist 2 consecutive sessions.  OT Short Term Goal 3 (Week 3): Pt will complete LB dressing sit to stand with mod assist for 2 consecutive sessions.  OT Short Term Goal 4 (Week 3): Pt will maintain standing with min guard assist while completing 2-3 grooming tasks.    Skilled Therapeutic Interventions/Progress Updates:    Pt seen supine in bed awake, with no pain reported and agreeable to therapy. Pt declined opportunity to wash up at EOB reporting he had a night bath yesterday. In order to increase flexibility in hips to achieve figure 4, OT stretched BLE in all planes of motion in prep for dressing EOB. Pt supine to sit with MOD A for LE management and trunk facilitation. Pt required MOD A to scoot to EOB where feet were touching floor. With increased time, pt dons pull over shirt with supervision and VC to orient shirt and pull sleeves past elbows before threading head through opening. Pt able to recognize shirt was backwards, but unable to problem solve how to turn shirt around during first donning attempt. Pt unable to recall hemi dressing technique with LE dressing and with VC places L foot on standard height chair to thread pant leg onto LLE. Pt require A to advance pants in standing. Pt completes 4 sit to stand throughout session all MIN A-MOD A with fatigue with VC for anterior trunk flexion, weight shift forward in standing and posture. Pt demo x2 MOD LOB posteriorly with MOD A to recover. Pt stand pivot transfer with MIN A EOB>TIS w/c with Re  with MIN A and VC for RW management and side stepping. Pt attempts to sit too soon, but with VC maintains standing and continues to step around to sit in chair. Pt left in TIS w/c with call light in reach and quick release belt donned. Pt participated in therapy with red trach cap and O2 level stay >90%.  Therapy Documentation Precautions:  Precautions Precautions: Fall Precaution Comments: B wrist restraints Restrictions Weight Bearing Restrictions: No General:   Vital Signs:   ADL:   Exercises:   Other Treatments:    See Function Navigator for Current Functional Status.   Therapy/Group: Individual Therapy  Shon HaleStephanie M Betzaida Cremeens 08/05/2016, 10:07 AM

## 2016-08-05 NOTE — Progress Notes (Signed)
Gerlene BurdockRichard Jon BillingsMorrison is a 76 y.o. male 07-13-1940 [id number]  Subjective: Sleeping - reviewed concern of hematuria   Objective: Vital signs in last 24 hours: Temp:  [98.1 F (36.7 C)-98.6 F (37 C)] 98.1 F (36.7 C) (03/18 0511) Pulse Rate:  [56-73] 71 (03/18 0850) Resp:  [16-18] 18 (03/18 0850) BP: (105-107)/(48-56) 107/48 (03/18 0511) SpO2:  [96 %-100 %] 100 % (03/18 0850) Weight:  [83.9 kg (185 lb)] 83.9 kg (185 lb) (03/18 0511) Weight change: 3.115 kg (6 lb 13.9 oz) Last BM Date: 08/04/16  Intake/Output from previous day: 03/17 0701 - 03/18 0700 In: 1440 [P.O.:240; NG/GT:300] Out: 1700 [Urine:1700]  Physical Exam General: NAD Lungs: CTA B, no w/c. Cardiovascular: RRR, no edema  Lab Results: BMET    Component Value Date/Time   NA 138 07/30/2016 0427   K 3.8 07/30/2016 0427   CL 99 (L) 07/30/2016 0427   CO2 32 07/30/2016 0427   GLUCOSE 101 (H) 07/30/2016 0427   BUN 14 07/30/2016 0427   CREATININE 0.75 07/30/2016 0427   CALCIUM 9.1 07/30/2016 0427   GFRNONAA >60 07/30/2016 0427   GFRAA >60 07/30/2016 0427   CBC    Component Value Date/Time   WBC 7.5 08/05/2016 0739   RBC 3.99 (L) 08/05/2016 0739   HGB 10.9 (L) 08/05/2016 0739   HCT 35.9 (L) 08/05/2016 0739   PLT 358 08/05/2016 0739   MCV 90.0 08/05/2016 0739   MCH 27.3 08/05/2016 0739   MCHC 30.4 08/05/2016 0739   RDW 16.9 (H) 08/05/2016 0739   LYMPHSABS 1.3 07/20/2016 0443   MONOABS 0.5 07/20/2016 0443   EOSABS 0.3 07/20/2016 0443   BASOSABS 0.0 07/20/2016 0443   CBG's (last 3):    Recent Labs  08/04/16 1651 08/04/16 2033 08/05/16 0652  GLUCAP 124* 105* 96   LFT's Lab Results  Component Value Date   ALT 23 07/20/2016   AST 16 07/20/2016   ALKPHOS 102 07/20/2016   BILITOT 0.4 07/20/2016    Studies/Results: No results found.  Medications:  I have reviewed the patient's current medications. Scheduled Medications: . aspirin  81 mg Per Tube Daily  . carbidopa-levodopa  1.5 tablet Per  Tube QID  . chlorhexidine  15 mL Mouth/Throat BID  . feeding supplement (JEVITY 1.5 CAL/FIBER)  300 mL Per Tube QID  . feeding supplement (PRO-STAT SUGAR FREE 64)  30 mL Per Tube TID  . fluticasone  2 spray Each Nare BID  . free water  100 mL Per Tube TID WC & HS  . furosemide  40 mg Per Tube Daily  . guaiFENesin  10 mL Oral TID WC & HS  . insulin aspart  0-5 Units Subcutaneous QHS  . insulin aspart  0-9 Units Subcutaneous TID WC  . mouth rinse  15 mL Mouth Rinse QID  . naphazoline-glycerin  1-2 drop Both Eyes TID WC  . neomycin-polymyxin-hydrocortisone  4 drop Right Ear Q6H  . pantoprazole sodium  40 mg Per Tube Daily  . scopolamine  1 patch Transdermal Q72H   PRN Medications: acetaminophen, alum & mag hydroxide-simeth, bisacodyl, guaiFENesin-dextromethorphan, polyethylene glycol, prochlorperazine **OR** prochlorperazine **OR** prochlorperazine, sennosides, sodium phosphate, traZODone  Assessment/Plan: Principal Problem:   Stroke (cerebrum) (HCC) Active Problems:   Chronic respiratory failure (HCC); s/p trach, poor cough mechanics, aspiration risk   Acute encephalopathy   Dysphagia, post-stroke   Parkinson's disease (HCC)   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(HCC)   Increased tracheal secretions   Blood-tinged sputum   1. B cerebral embolic CVAs - neuro deficits  include dysphagia, gait disorder and exacerbation of cognitive deficits - continue CIR with PT/OT/ST/RT and continue medical mgmt of medical co morbidities - no anticoag due to bleeding complications (hematuria and hemopytsis) and on ASA 81mg  qd 2. Trach due to resp failure and dysphagia (with G tube) - supportive care; SSI while on Tube Feeding 3. Parkinson's Disease - continue meds and support 4. Hematuria - note stable (and technically improved) Hgb over past 48h on CBC - continue to follow prn   Length of stay, days: 17  Valerie A. Felicity Coyer, MD 08/05/2016, 10:04 AM

## 2016-08-06 ENCOUNTER — Inpatient Hospital Stay (HOSPITAL_COMMUNITY): Payer: Medicare Other | Admitting: Occupational Therapy

## 2016-08-06 ENCOUNTER — Inpatient Hospital Stay (HOSPITAL_COMMUNITY): Payer: Medicare Other | Admitting: Physical Therapy

## 2016-08-06 ENCOUNTER — Inpatient Hospital Stay (HOSPITAL_COMMUNITY): Payer: Medicare Other | Admitting: Speech Pathology

## 2016-08-06 LAB — URINALYSIS, ROUTINE W REFLEX MICROSCOPIC
Bilirubin Urine: NEGATIVE
GLUCOSE, UA: NEGATIVE mg/dL
Ketones, ur: 5 mg/dL — AB
Nitrite: NEGATIVE
PH: 5 (ref 5.0–8.0)
PROTEIN: 100 mg/dL — AB
Specific Gravity, Urine: 1.021 (ref 1.005–1.030)
Squamous Epithelial / LPF: NONE SEEN

## 2016-08-06 LAB — BASIC METABOLIC PANEL
Anion gap: 10 (ref 5–15)
BUN: 19 mg/dL (ref 6–20)
CHLORIDE: 99 mmol/L — AB (ref 101–111)
CO2: 30 mmol/L (ref 22–32)
Calcium: 9 mg/dL (ref 8.9–10.3)
Creatinine, Ser: 0.92 mg/dL (ref 0.61–1.24)
GFR calc non Af Amer: >60 mL/min (ref >60)
Glucose, Bld: 85 mg/dL (ref 65–99)
POTASSIUM: 4.2 mmol/L (ref 3.5–5.1)
SODIUM: 139 mmol/L (ref 135–145)

## 2016-08-06 LAB — CBC
HEMATOCRIT: 31.9 % — AB (ref 39.0–52.0)
Hemoglobin: 9.8 g/dL — ABNORMAL LOW (ref 13.0–17.0)
MCH: 27.1 pg (ref 26.0–34.0)
MCHC: 30.7 g/dL (ref 30.0–36.0)
MCV: 88.4 fL (ref 78.0–100.0)
Platelets: 265 10*3/uL (ref 150–400)
RBC: 3.61 MIL/uL — AB (ref 4.22–5.81)
RDW: 16.6 % — ABNORMAL HIGH (ref 11.5–15.5)
WBC: 5.7 10*3/uL (ref 4.0–10.5)

## 2016-08-06 LAB — GLUCOSE, CAPILLARY
Glucose-Capillary: 126 mg/dL — ABNORMAL HIGH (ref 65–99)
Glucose-Capillary: 110 mg/dL — ABNORMAL HIGH (ref 65–99)
Glucose-Capillary: 87 mg/dL (ref 65–99)
Glucose-Capillary: 125 mg/dL — ABNORMAL HIGH (ref 65–99)
Glucose-Capillary: 87 mg/dL (ref 65–99)

## 2016-08-06 MED ORDER — FREE WATER
200.0000 mL | Freq: Three times a day (TID) | Status: DC
  Administered 2016-08-06 – 2016-08-13 (×27): 200 mL

## 2016-08-06 NOTE — Progress Notes (Signed)
Occupational Therapy Session Note  Patient Details  Name: Gregory [name] MRN: [id number] Date of Birth: [birth year]  Today's Date: 08/06/2016 OT Individual Time: [id number] OT Individual Time Calculation (min): 68 min    Short Term Goals: Week 3:  OT Short Term Goal 1 (Week 3): Pt will complete UB dressing sitting unsupported with supervision for 2 consecutive sessions.  OT Short Term Goal 2 (Week 3): pt will complete LB bathing sit to stand with min assist 2 consecutive sessions.  OT Short Term Goal 3 (Week 3): Pt will complete LB dressing sit to stand with mod assist for 2 consecutive sessions.  OT Short Term Goal 4 (Week 3): Pt will maintain standing with min guard assist while completing 2-3 grooming tasks.    Skilled Therapeutic Interventions/Progress Updates:    Pt transferred supine to sit with Mod assist and then to the wheelchair with Max assist stand pivot.  Increased knee flexion and posterior lean in standing with decreased ability to achieve upright standing or to move LEs efficiently during stand pivot transfer.  Supervision for UB bathing sitting unsupported in the wheelchair.  Pt with increased lean to the left in sitting as well as with standing at the sink, requiring mod assist to complete sit to stand.  Pt unable to correct and maintain midline sitting orientation, even with mirror for feedback.  Pt needed mod demonstrational cueing for dressing to donn pullover shirt.  Pt tends to try and place it over his head before bringing it up over his arms high enough.  After therapist assisted with donning brief and new shorts, pt became confused and was not aware that we had already placed new shorts on him.  Finished session with combing his hair from the wheelchair with min assist.  Pt's son [name] present for session and educated on encouraging midline orientation.  Safety belt in place and call button in reach.  O2 sats 99% on room air with trach capped.  Did not apply wrist  restraints secondary to son and son's girlfriend being present.    Therapy Documentation Precautions:  Precautions Precautions: Fall Precaution Comments: B wrist restraints Restrictions Weight Bearing Restrictions: No  Vital Signs: Therapy Vitals Pulse Rate: (!) 57 Resp: 16 Patient Position (if appropriate): Sitting Oxygen Therapy SpO2: 97 % O2 Device: Not Delivered Pain: Pain Assessment Pain Assessment: No/denies pain ADL: See Function Navigator for Current Functional Status.   Therapy/Group: Individual Therapy  Kasidy Gianino OTR/L 08/06/2016, 12:45 PM

## 2016-08-06 NOTE — Progress Notes (Signed)
ST reporting increase in confusion today. Labs WNL but decrease in UOP reported by nursing. Will increase water flushes. H/H stable overall.  Question UTI--will check UA/UCS.

## 2016-08-06 NOTE — Progress Notes (Signed)
Pt refused CPAP for tonight and refused for dayshift RT.

## 2016-08-06 NOTE — Progress Notes (Signed)
Patient slept quietly during night with red cap in place to trach.  No coughing present.  O2 sats remained between 97-100% on room air.  Foley remains in place draining Zahli Vetsch colored urine.    Kelli HopeBarber, Draysen Weygandt M
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
## 2016-08-06 NOTE — Progress Notes (Signed)
Speech Language Pathology Daily Session Note  Patient Details  Name: Gregory [name] MRN: [id number] Date of Birth: 1941-05-09  Today's Date: 08/06/2016  Treatment Session #1 SLP Individual Time: 0830-0900 SLP Individual Time Calculation (min): 30 min   Treatment Session #2 SLP Individual Time: 1430-1500 SLP Individual Time Calculation (min): 30 min  Short Term Goals: Week 3: SLP Short Term Goal 1 (Week 3): Patient will consume trials of nectar-thick liquids via tsp without overt s/s of aspiration with Mod A multimodal cues for use of compensatory swallow strategies.  SLP Short Term Goal 2 (Week 3): Patient will increase speech intelligibility to ~75% at the sentence level with Min A cues for use of speech intelligibility strategies.  SLP Short Term Goal 3 (Week 3): Pt will demonstrate sustained attention to a functional task for ~ 5 minutes with Mod A verbal cues. for redirection to task.  SLP Short Term Goal 4 (Week 3): Pt will complete basic familiar tasks (related to ADLs, brushing hair, oral care) with Mod A verbal cues.   Skilled Therapeutic Interventions:   Skilled treatment session #1 - focused on cognition goals related to clearing mucus that appeared to be in posterior oral cavity. SLP received pt in bed, trach capped and on room air. Secretions accumulated in mouth and drooling out of mouth. Pt with inability to cough to clear secretions, voice wet but O2 stats 100%. Total A cues with oral suctioning for over 10 minutes to clear voice. Thick secretion suctioned from oral cavity. Oral care performed. Pt with some confused statements but was redirected. Pt left with upright in bed, bed alarm on and all needs within reach. Continue per current plan of care.   Skilled treatment session #2 - focused on cognition and dysphagia goals. Gregory [name] was capped when SLP arrived to room. SLP facilitated session by providing skilled observation of pt consuming nectar thick liquids via spoon. Pt  required Max A multimodal cues for use of chin tuck. Pt with wet voice during 50% of trials (improvement over previous session). Pt with increased confusion this session, PA made aware. Education provided to son Gregory Deer(Christopher) who was present during session. Will plan to observe full tray of nectar thick liquids on 08/07/16. Pt was left upright in wheelchair, safety belt donned, son present and all needs within reach. Continue per current plan of care.   Function:    Cognition Comprehension Comprehension assist level: Understands basic 50 - 74% of the time/ requires cueing 25 - 49% of the time  Expression Expression assistive device: Talk trach valve Expression assist level: Expresses basic 50 - 74% of the time/requires cueing 25 - 49% of the time. Needs to repeat parts of sentences.  Social Interaction Social Interaction assist level: Interacts appropriately 50 - 74% of the time - May be physically or verbally inappropriate.  Problem Solving Problem solving assist level: Solves basic 25 - 49% of the time - needs direction more than half the time to initiate, plan or complete simple activities  Memory Memory assist level: Recognizes or recalls 25 - 49% of the time/requires cueing 50 - 75% of the time    Pain    Therapy/Group: Individual Therapy  Gregory [name] 08/06/2016, 12:24 PM

## 2016-08-06 NOTE — Progress Notes (Signed)
Occupational Therapy Session Note  Patient Details  Name: Gregory [name] MRN: [id number] Date of Birth: [birth year]  Today's Date: 08/06/2016 OT Individual Time: [id number] OT Individual Time Calculation (min): 38 min    Short Term Goals: Week 3:  OT Short Term Goal 1 (Week 3): Pt will complete UB dressing sitting unsupported with supervision for 2 consecutive sessions.  OT Short Term Goal 2 (Week 3): pt will complete LB bathing sit to stand with min assist 2 consecutive sessions.  OT Short Term Goal 3 (Week 3): Pt will complete LB dressing sit to stand with mod assist for 2 consecutive sessions.  OT Short Term Goal 4 (Week 3): Pt will maintain standing with min guard assist while completing 2-3 grooming tasks.    Skilled Therapeutic Interventions/Progress Updates:    Pt worked on sit to stand transitions and standing balance from the wheelchair.  Mod assist for sit to stand with pt demonstrating decreased knee and hip extension as well as decreased forward weightshift.  Pt also demonstrates forward cervical flexion in standing.  Worked on transitions from squat to stand to help increase hip extension, with overall mod assist.  Worked in sitting with BUE AAROM exercises using 2lb dowel rod.  He was able to complete 2 sets of 10 repetitions for shoulder flexion while sitting unsupported EOC.  Mod facilitation needed to achieve 120 degrees of flexion.  Pt left in wheelchair at end of session with son Gregory [name] present.  Nursing also present.  Therapist applied safety belt  to conclude session.      Therapy Documentation Precautions:  Precautions Precautions: Fall Precaution Comments: B wrist restraints Restrictions Weight Bearing Restrictions: No  Pain: Pain Assessment Pain Assessment: No/denies pain ADL: See Function Navigator for Current Functional Status.   Therapy/Group: Individual Therapy  Jaja Switalski OTR/L 08/06/2016, 4:41 PM

## 2016-08-06 NOTE — Progress Notes (Signed)
Physical Therapy Session Note  Patient Details  Name: Gregory [name] MRN: [id number] Date of Birth: 02/05/1941  Today's Date: 08/06/2016 PT Individual Time: 1301-1350 PT Individual Time Calculation (min): 49 min    Therapy Documentation Precautions:  Precautions Precautions: Fall Precaution Comments: B wrist restraints Restrictions Weight Bearing Restrictions: No Pain: Pain Assessment Pain Assessment: No/denies pain  Patient received seated in wheelchair with son present in room.   Sit to and from stand transfer with RW min assist. Verbal cues for proper positioning and hand placement.   Patient performed Dynavision activity three trials following a visual demonstration. Patient performed three trials each for 2 minutes in standing with RW with assistance varying from close supervision to min assist with single upper extremity support on RW. Patient performed visual scanning in four quadrants and reach with min to mod excursions.  Patient was successful with hitting targets 12/36, 18/36, and 27/36 respectively for each 2 minute trial.   Patient ambulated 90 feet with RW min and a standing rest break verbal cues secondary to external distraction and environmental factors. Patient then continued ambulation 103 feet with RW min assist. Patient son with chair follow secondary to increased target distance and focus on cardiovascular endurance. Min assist for tactile cues for weight shifting and RW management. Verbal cues for increased step length.  Patient returned to room at end of session with quick release belt engaged and patient resting in tilt in space WC and son Gregory [name] present. Patient left with all needs met. Vitals monitored and remained stable throughout session responding appropriately with activity.     See Function Navigator for Current Functional Status.   Therapy/Group: Individual Therapy  Retta Diones 08/06/2016, 2:13 PM

## 2016-08-06 NOTE — Progress Notes (Signed)
Pt has been capped all day and has maintained O2 saturations in the mid 90's. Vitals have been stable. Pt has exhibited a few bouts of confusion and PA was notified. Free water flushes increased and Urinalysis ordered and collected. Family notified new orders.

## 2016-08-06 NOTE — Progress Notes (Signed)
Subjective/Complaints: Gregory [name] has remained capped x 48hr over the weekend.  No longer has bilateral wrist restr4aints, has not pulled at trach or G tube, ? If foley pulled since pt had hematuria on Sat.    ROS: Limited due to cognition  Objective: Vital Signs: Blood pressure (!) 130/55, pulse (!) 54, temperature 98.2 F (36.8 C), temperature source Oral, resp. rate 16, height '5\' 9"'  (1.753 m), weight 84.3 kg (185 lb 13.6 oz), SpO2 100 %. No results found. Results for orders placed or performed during the hospital encounter of 07/19/16 (from the past 72 hour(s))  Glucose, capillary     Status: Abnormal   Collection Time: 08/03/16 11:55 AM  Result Value Ref Range   Glucose-Capillary 126 (H) 65 - 99 mg/dL   Comment 1 Notify RN   CBC     Status: Abnormal   Collection Time: 08/03/16  1:20 PM  Result Value Ref Range   WBC 9.4 4.0 - 10.5 K/uL   RBC 3.79 (L) 4.22 - 5.81 MIL/uL   Hemoglobin 10.5 (L) 13.0 - 17.0 g/dL   HCT 34.3 (L) 39.0 - 52.0 %   MCV 90.5 78.0 - 100.0 fL   MCH 27.7 26.0 - 34.0 pg   MCHC 30.6 30.0 - 36.0 g/dL   RDW 17.2 (H) 11.5 - 15.5 %   Platelets 318 150 - 400 K/uL  Glucose, capillary     Status: Abnormal   Collection Time: 08/03/16  4:34 PM  Result Value Ref Range   Glucose-Capillary 115 (H) 65 - 99 mg/dL  Glucose, capillary     Status: None   Collection Time: 08/03/16  9:19 PM  Result Value Ref Range   Glucose-Capillary 87 65 - 99 mg/dL  Glucose, capillary     Status: Abnormal   Collection Time: 08/04/16  6:47 AM  Result Value Ref Range   Glucose-Capillary 110 (H) 65 - 99 mg/dL  CBC     Status: Abnormal   Collection Time: 08/04/16  8:16 AM  Result Value Ref Range   WBC 6.2 4.0 - 10.5 K/uL   RBC 3.54 (L) 4.22 - 5.81 MIL/uL   Hemoglobin 9.7 (L) 13.0 - 17.0 g/dL   HCT 31.8 (L) 39.0 - 52.0 %   MCV 89.8 78.0 - 100.0 fL   MCH 27.4 26.0 - 34.0 pg   MCHC 30.5 30.0 - 36.0 g/dL   RDW 17.0 (H) 11.5 - 15.5 %   Platelets 300 150 - 400 K/uL  Glucose, capillary      Status: Abnormal   Collection Time: 08/04/16 11:34 AM  Result Value Ref Range   Glucose-Capillary 104 (H) 65 - 99 mg/dL  Glucose, capillary     Status: Abnormal   Collection Time: 08/04/16  4:51 PM  Result Value Ref Range   Glucose-Capillary 124 (H) 65 - 99 mg/dL  Glucose, capillary     Status: Abnormal   Collection Time: 08/04/16  8:33 PM  Result Value Ref Range   Glucose-Capillary 105 (H) 65 - 99 mg/dL   Comment 1 Notify RN   Glucose, capillary     Status: None   Collection Time: 08/05/16  6:52 AM  Result Value Ref Range   Glucose-Capillary 96 65 - 99 mg/dL  CBC     Status: Abnormal   Collection Time: 08/05/16  7:39 AM  Result Value Ref Range   WBC 7.5 4.0 - 10.5 K/uL   RBC 3.99 (L) 4.22 - 5.81 MIL/uL   Hemoglobin 10.9 (L) 13.0 - 17.0 g/dL  HCT 35.9 (L) 39.0 - 52.0 %   MCV 90.0 78.0 - 100.0 fL   MCH 27.3 26.0 - 34.0 pg   MCHC 30.4 30.0 - 36.0 g/dL   RDW 16.9 (H) 11.5 - 15.5 %   Platelets 358 150 - 400 K/uL  Glucose, capillary     Status: Abnormal   Collection Time: 08/05/16 11:45 AM  Result Value Ref Range   Glucose-Capillary 115 (H) 65 - 99 mg/dL  Glucose, capillary     Status: None   Collection Time: 08/05/16  5:03 PM  Result Value Ref Range   Glucose-Capillary 81 65 - 99 mg/dL  Glucose, capillary     Status: Abnormal   Collection Time: 08/05/16  8:53 PM  Result Value Ref Range   Glucose-Capillary 110 (H) 65 - 99 mg/dL   Comment 1 Notify RN   Basic metabolic panel     Status: Abnormal   Collection Time: 08/06/16  5:48 AM  Result Value Ref Range   Sodium 139 135 - 145 mmol/L   Potassium 4.2 3.5 - 5.1 mmol/L   Chloride 99 (L) 101 - 111 mmol/L   CO2 30 22 - 32 mmol/L   Glucose, Bld 85 65 - 99 mg/dL   BUN 19 6 - 20 mg/dL   Creatinine, Ser 0.92 0.61 - 1.24 mg/dL   Calcium 9.0 8.9 - 10.3 mg/dL   GFR calc non Af Amer >60 >60 mL/min   GFR calc Af Amer >60 >60 mL/min    Comment: (NOTE) The eGFR has been calculated using the CKD EPI equation. This calculation has  not been validated in all clinical situations. eGFR's persistently <60 mL/min signify possible Chronic Kidney Disease.    Anion gap 10 5 - 15  CBC     Status: Abnormal   Collection Time: 08/06/16  5:48 AM  Result Value Ref Range   WBC 5.7 4.0 - 10.5 K/uL   RBC 3.61 (L) 4.22 - 5.81 MIL/uL   Hemoglobin 9.8 (L) 13.0 - 17.0 g/dL   HCT 31.9 (L) 39.0 - 52.0 %   MCV 88.4 78.0 - 100.0 fL   MCH 27.1 26.0 - 34.0 pg   MCHC 30.7 30.0 - 36.0 g/dL   RDW 16.6 (H) 11.5 - 15.5 %   Platelets 265 150 - 400 K/uL  Glucose, capillary     Status: None   Collection Time: 08/06/16  6:43 AM  Result Value Ref Range   Glucose-Capillary 87 65 - 99 mg/dL     HEENT: Normocephalic. Healing trauma Neck: #6 shiley, significant secretions, white thick, no bloody streaks Cardio: RRR. No JVD.  Resp: Ronchi and Unlabored GI: BS positive and ND, PEG site clean, dry and intact. Nontender to palpation. No drainage  Skin:   Other trach site without bleeding Neuro: Somnolent Sensory cannot assess due to somnolence , Motor: 4/5 in bue and BLE Musc/Skel:  No edema. Mild R rib tenderness.  Gen NAD, Vital signs reviewed.  GU Foley bag .golden colored urine  Assessment/Plan: 1. Functional deficits secondary to bilateral cortical and subcortical cardioembolic infarcts which require 3+ hours per day of interdisciplinary therapy in a comprehensive inpatient rehab setting. Physiatrist is providing close team supervision and 24 hour management of active medical problems listed below. Physiatrist and rehab team continue to assess barriers to discharge/monitor patient progress toward functional and medical goals. FIM: Function - Bathing Bathing activity did not occur: Refused Position: Sitting EOB Body parts bathed by patient: Right arm, Left arm, Chest, Abdomen, Right  upper leg, Left upper leg Body parts bathed by helper: Right lower leg, Left lower leg, Buttocks, Front perineal area, Back Bathing not applicable: Front  perineal area, Buttocks Assist Level: Touching or steadying assistance(Pt > 75%)  Function- Upper Body Dressing/Undressing What is the patient wearing?: Pull over shirt/dress Pull over shirt/dress - Perfomed by patient: Thread/unthread right sleeve, Thread/unthread left sleeve, Put head through opening, Pull shirt over trunk Pull over shirt/dress - Perfomed by helper: Pull shirt over trunk Assist Level: Supervision or verbal cues Function - Lower Body Dressing/Undressing What is the patient wearing?: Pants Position: Sitting EOB Pants- Performed by patient: Thread/unthread right pants leg, Thread/unthread left pants leg Pants- Performed by helper: Pull pants up/down Non-skid slipper socks- Performed by patient: Don/doff left sock Non-skid slipper socks- Performed by helper: Don/doff right sock Socks - Performed by patient: Don/doff left sock Socks - Performed by helper: Don/doff right sock Shoes - Performed by helper: Don/doff right shoe, Don/doff left shoe, Fasten right, Fasten left Assist for footwear: Maximal assist Assist for lower body dressing:  (MOD A)  Function - Toileting Toileting activity did not occur: No continent bowel/bladder event Toileting steps completed by patient: Adjust clothing after toileting Toileting steps completed by helper: Performs perineal hygiene, Adjust clothing prior to toileting, Adjust clothing after toileting Toileting Assistive Devices: Grab bar or rail, Other (comment) (RW) Assist level: Touching or steadying assistance (Pt.75%)  Function - Air cabin crew transfer activity did not occur:  (no bm. foley in place) Toilet transfer assistive device: Elevated toilet seat/BSC over toilet Mechanical lift: Stedy Assist level to toilet: Touching or steadying assistance (Pt > 75%) Assist level from toilet: Touching or steadying assistance (Pt > 75%) Assist level to bedside commode (at bedside): 2 helpers Assist level from bedside commode (at  bedside): 2 helpers  Function - Chair/bed transfer Chair/bed transfer method: Stand pivot Chair/bed transfer assist level: Touching or steadying assistance (Pt > 75%) Chair/bed transfer assistive device: Armrests, Environmental manager lift: Stedy Chair/bed transfer details: Verbal cues for technique, Tactile cues for posture, Verbal cues for sequencing, Manual facilitation for weight shifting, Manual facilitation for placement  Function - Locomotion: Wheelchair Will patient use wheelchair at discharge?: Yes Type: Manual Wheelchair activity did not occur: Safety/medical concerns Max wheelchair distance: 17f  Assist Level: Supervision or verbal cues Wheel 50 feet with 2 turns activity did not occur: Safety/medical concerns Assist Level: Supervision or verbal cues Wheel 150 feet activity did not occur: Safety/medical concerns Assist Level: Touching or steadying assistance (Pt > 75%) Turns around,maneuvers to table,bed, and toilet,negotiates 3% grade,maneuvers on rugs and over doorsills: No Function - Locomotion: Ambulation Ambulation activity did not occur: Safety/medical concerns Assistive device: Walker-rolling Max distance: 20 ft Assist level: Moderate assist (Pt 50 - 74%) Walk 10 feet activity did not occur: Safety/medical concerns Assist level: Moderate assist (Pt 50 - 74%) Walk 50 feet with 2 turns activity did not occur: Safety/medical concerns Assist level: Touching or steadying assistance (Pt > 75%) Walk 150 feet activity did not occur: Safety/medical concerns Walk 10 feet on uneven surfaces activity did not occur: Safety/medical concerns  Function - Comprehension Comprehension: Auditory Comprehension assist level: Understands basic 50 - 74% of the time/ requires cueing 25 - 49% of the time  Function - Expression Expression: Verbal Expression assistive device: Talk trach valve Expression assist level: Expresses basic 50 - 74% of the time/requires cueing 25 - 49% of the  time. Needs to repeat parts of sentences.  Function - Social Interaction Social Interaction assist  level: Interacts appropriately 50 - 74% of the time - May be physically or verbally inappropriate.  Function - Problem Solving Problem solving assist level: Solves basic 25 - 49% of the time - needs direction more than half the time to initiate, plan or complete simple activities  Function - Memory Memory assist level: Recognizes or recalls 25 - 49% of the time/requires cueing 50 - 75% of the time Patient normally able to recall (first 3 days only): None of the above  Medical Problem List and Plan: 1. 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 DVT Prophylaxis/Anticoagulation: Pharmaceutical: Other (comment)-- heparin, warfarin, or DOAC cause hematuria and hemoptysis  continue baby aspirin, 3. Pain Management: tylenol prn pain. Question muscle spasms--will monitor 4. Mood: LCSW to follow for evaluation as mentation improves.  5. Neuropsych: This patient is notcapable of making decisions on hisown behalf. 6. Skin/Wound Care: routine pressure relief measures.  7. Fluids/Electrolytes/Nutrition: Monitor I/O and adjust fluid boluses as needed. Continue tube feeds.  BMP Latest Ref Rng & Units 08/06/2016 07/30/2016 07/23/2016  Glucose 65 - 99 mg/dL 85 101(H) 128(H)  BUN 6 - 20 mg/dL 19 14 21(H)  Creatinine 0.61 - 1.24 mg/dL 0.92 0.75 0.76  Sodium 135 - 145 mmol/L 139 138 136  Potassium 3.5 - 5.1 mmol/L 4.2 3.8 4.2  Chloride 101 - 111 mmol/L 99(L) 99(L) 99(L)  CO2 22 - 32 mmol/L 30 32 33(H)  Calcium 8.9 - 10.3 mg/dL 9.0 9.1 8.9  . 8. 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 Parkinson's disease: Continue Sinemet 37.5/150 qid.  10.  Leucocytosis: Resolved 11. ABLA: Continue to monitor-no acute drops, Stool guaic neg CBC Latest Ref Rng & Units 08/06/2016 08/05/2016 08/04/2016  WBC 4.0 - 10.5 K/uL 5.7 7.5 6.2  Hemoglobin 13.0 - 17.0 g/dL 9.8(L) 10.9(L) 9.7(L)  Hematocrit 39.0 - 52.0 % 31.9(L) 35.9(L) 31.8(L)  Platelets 150 - 400 K/uL 265 358 300     12. Hematuria: resolved , Hemoglobin steady-           13. Severe dysphagia: NPO till mentation improv  tolerating PEG feedings, Some therapeutic feeds with speech therapy, recommend pt gets downsized prior to scheduling MBS   LOS (Days) 18 A FACE TO FACE EVALUATION WAS PERFORMED  Gerhart Ruggieri E 08/06/2016, 8:38 AM

## 2016-08-07 ENCOUNTER — Inpatient Hospital Stay (HOSPITAL_COMMUNITY): Payer: Medicare Other | Admitting: Occupational Therapy

## 2016-08-07 ENCOUNTER — Inpatient Hospital Stay (HOSPITAL_COMMUNITY): Payer: Medicare Other | Admitting: Speech Pathology

## 2016-08-07 ENCOUNTER — Inpatient Hospital Stay (HOSPITAL_COMMUNITY): Payer: Medicare Other | Admitting: Physical Therapy

## 2016-08-07 LAB — GLUCOSE, CAPILLARY
GLUCOSE-CAPILLARY: 101 mg/dL — AB (ref 65–99)
GLUCOSE-CAPILLARY: 105 mg/dL — AB (ref 65–99)
Glucose-Capillary: 93 mg/dL (ref 65–99)
Glucose-Capillary: 108 mg/dL — ABNORMAL HIGH (ref 65–99)

## 2016-08-07 NOTE — Progress Notes (Signed)
Occupational Therapy Session Note  Patient Details  Name: Gregory [name] MRN: [id number] Date of Birth: [birth year]  Today's Date: 08/07/2016 OT Individual Time: 4098-1191 OT Individual Time Calculation (min): 49 min    Short Term Goals: Week 3:  OT Short Term Goal 1 (Week 3): Pt will complete UB dressing sitting unsupported with supervision for 2 consecutive sessions.  OT Short Term Goal 2 (Week 3): pt will complete LB bathing sit to stand with min assist 2 consecutive sessions.  OT Short Term Goal 3 (Week 3): Pt will complete LB dressing sit to stand with mod assist for 2 consecutive sessions.  OT Short Term Goal 4 (Week 3): Pt will maintain standing with min guard assist while completing 2-3 grooming tasks.    Skilled Therapeutic Interventions/Progress Updates:    Session 1:  Therapist finished toileting task with pt to start session.  Stedy used for transfer to the [facility] over the toilet to begin session.  He was able to stand in the [place] for management of clothing prior to toileting attempt with min assist.  Unsuccessful for BM this session so stood back up with use of the [place].  Therapist assisted with cleaning peri area, and for managing clothing with mod assist.  Once finished, transferred to the tilt in space wheelchair for bathing and dressing at the sink.  Supervision for UB bathing with min assist for LB, excluding peri area which was completed at the toilet.  Worked on donning shorts beginning with the LLE first and then progressing to the right.  Min assist for threading the RLE.  Stood with min assist for pulling them up over his bottom this session.  Noted pt with better midline orientation in standing and in sitting today compared to previous sessions.  Min assist for donning the right sock crossing the LE over the opposite knee with supervision.  Only setup needed for donning the left one.  Finished session with max assist to donn shoes and tighten lace locks.  Pt left in  wheelchair with daughter Gregory [name] present and assisting with completion of grooming tasks.  Pt's O2 sats 95% or better on room air the entire session.    Session 2:   (13:02-13:36) Pt completed transitions of sit to stand and stand pivot transfers using the RW this session.  Min assist for sit to stand from the wheelchair with mod instructional cueing for hand placement on the arm rests to assist with pushing up and reaching back when sitting.  Pt still with decreased lumbar, thoracic, cervical, and hip extension in standing.  Increased LOB posteriorly as well with sit to stand transitions from the EOB during session.  Issued level 1 resistance theraband and educated pt and daughter on rowing exercise to help increase posture and scapular adduction.  He completed 2 sets of 10 repetitions with min assist and mod instructional cueing for technique sitting EOB unsupported.  Returned to wheelchair at end of session with daughter present in room as well.  Pt tilted back in chair and resting comfortably.      Therapy Documentation Precautions:  Precautions Precautions: Fall Precaution Comments: B wrist restraints Restrictions Weight Bearing Restrictions: No   Vital Signs: Therapy Vitals Temp: 98 F (36.7 C) Temp Source: Oral Pulse Rate: 76 Resp: 18 BP: (!) 99/51 Patient Position (if appropriate): Sitting Oxygen Therapy SpO2: 99 % O2 Device: Not Delivered Pain:  No report of pain  ADL: See Function Navigator for Current Functional Status.   Therapy/Group: Individual Therapy  Per Beagley  OTR/L 08/07/2016, 3:52 PM
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
## 2016-08-07 NOTE — Progress Notes (Signed)
Physical Therapy Note  Patient Details  Name: Gregory [name] MRN: [id number] Date of Birth: 01-Jan-1941 Today's Date: 08/07/2016    Time: 1415-1513 58 minutes  1:1 No c/o pain.  Gait with RW in controlled environment with min A 75'.  Gait with obstacle negotiation with cuing for obstacles on Lt, min/mod A for balance and RW control through obstacles.  Sit <> stand training with focus on eccentric control x 12 repetitions.  Standing balance with tap ups with 2 UEs and 1 UE support with min/mod A for balance.  Standing balance static without UE support x 60 seconds with min/mod A before fatigue.  Pt mod A for sit to supine for bilat LE assistance.  Pt left in bed at end of session with needs at hand.   Gregory [name] 08/07/2016, 3:14 PM

## 2016-08-07 NOTE — Plan of Care (Signed)
Problem: RH BLADDER ELIMINATION Goal: RH STG MANAGE BLADDER WITH EQUIPMENT WITH ASSISTANCE STG Manage Bladder With Equipment With mod Assistance  Outcome: Not Progressing Total assist foley in place

## 2016-08-07 NOTE — Plan of Care (Signed)
Problem: RH SAFETY Goal: RH STG ADHERE TO SAFETY PRECAUTIONS W/ASSISTANCE/DEVICE STG Adhere to Safety Precautions With min  Assistance/Device.   Outcome: Not Progressing 2+ assist with stedy

## 2016-08-07 NOTE — Progress Notes (Signed)
Speech Language Pathology Daily Session Note  Patient Details  Name: Gregory [name] MRN: [id number] Date of Birth: April 09, 1941  Today's Date: 08/07/2016 SLP Individual Time: 1100-1200 SLP Individual Time Calculation (min): 60 min  Short Term Goals: Week 3: SLP Short Term Goal 1 (Week 3): Patient will consume trials of nectar-thick liquids via tsp without overt s/s of aspiration with Mod A multimodal cues for use of compensatory swallow strategies.  SLP Short Term Goal 2 (Week 3): Patient will increase speech intelligibility to ~75% at the sentence level with Min A cues for use of speech intelligibility strategies.  SLP Short Term Goal 3 (Week 3): Pt will demonstrate sustained attention to a functional task for ~ 5 minutes with Mod A verbal cues. for redirection to task.  SLP Short Term Goal 4 (Week 3): Pt will complete basic familiar tasks (related to ADLs, brushing hair, oral care) with Mod A verbal cues.   Skilled Therapeutic Interventions: Skilled treatment session focused on cognition and dysphagia goals. SLP facilitated session by providing Max A faded to Min A multimodal cues to sequence 3 picture cards. Initially pt was impulsive and sporadic in his mental organization of task but after 2 to 3 attempts, pt able to complete task with Min A. Pt with attempts to comment on off topic subjects but was redirection by SLP cues. Pt able to sustain attention for ~ 5 minutes with Mod A verbal cues. Skilled observation provided of tomato soup and iced tea that were thicken to nectar consistency. Daughter present and education complete with daughter on thickening liquids. She was able to return demonstrate. Pt consumed nectar thick liquids via spoon sips with Max a multimodal cues for chin tuck. Pt's O2 monitored during consumption. On the one occasion that pt didn't tuck chin, pt with wet voice, cough and O2 stats dropping to 75%. Pt able to effectively cough and deep breath to bring O2 up to 100%. Pt  continued with consumption of nectar thick and no overt s/s of aspiration noted with use of chin tuck. Will continue increased trials in ST session before further upgrade. Of note, pt with increased cough strength. Pt was left with daughter, upright in chair, safety belt in place and all needs within reach. Continue NPO status and current plan of care.      Function:  Eating Eating   Modified Consistency Diet: Yes (Nectar thick trials with SLP only) Eating Assist Level: More than reasonable amount of time   Eating Set Up Assist For: Opening containers       Cognition Comprehension Comprehension assist level: Understands basic 75 - 89% of the time/ requires cueing 10 - 24% of the time  Expression   Expression assist level: Expresses basic 75 - 89% of the time/requires cueing 10 - 24% of the time. Needs helper to occlude trach/needs to repeat words.  Social Interaction Social Interaction assist level: Interacts appropriately 75 - 89% of the time - Needs redirection for appropriate language or to initiate interaction.  Problem Solving Problem solving assist level: Solves basic 25 - 49% of the time - needs direction more than half the time to initiate, plan or complete simple activities  Memory Memory assist level: Recognizes or recalls 25 - 49% of the time/requires cueing 50 - 75% of the time    Pain    Therapy/Group: Individual Therapy   Gregory [name], M.S., CCC-SLP Speech-Language Pathologist   Gregory [name] 08/07/2016, 3:02 PM

## 2016-08-08 ENCOUNTER — Encounter (HOSPITAL_COMMUNITY): Payer: Medicare Other | Admitting: Psychology

## 2016-08-08 ENCOUNTER — Inpatient Hospital Stay (HOSPITAL_COMMUNITY): Payer: Medicare Other | Admitting: Occupational Therapy

## 2016-08-08 ENCOUNTER — Inpatient Hospital Stay (HOSPITAL_COMMUNITY): Payer: Medicare Other | Admitting: Speech Pathology

## 2016-08-08 ENCOUNTER — Inpatient Hospital Stay (HOSPITAL_COMMUNITY): Payer: Medicare Other | Admitting: Physical Therapy

## 2016-08-08 LAB — URINE CULTURE: Culture: >=100000 — AB

## 2016-08-08 LAB — GLUCOSE, CAPILLARY
GLUCOSE-CAPILLARY: 102 mg/dL — AB (ref 65–99)
Glucose-Capillary: 90 mg/dL (ref 65–99)
Glucose-Capillary: 87 mg/dL (ref 65–99)
Glucose-Capillary: 95 mg/dL (ref 65–99)

## 2016-08-08 NOTE — Progress Notes (Signed)
Occupational Therapy Session Note  Patient Details  Name: Gregory [name] MRN: [id number] Date of Birth: 1941/02/24  Today's Date: 08/08/2016 OT Individual Time: 1400-1449 OT Individual Time Calculation (min): 49 min    Short Term Goals: Week 3:  OT Short Term Goal 1 (Week 3): Pt will complete UB dressing sitting unsupported with supervision for 2 consecutive sessions.  OT Short Term Goal 2 (Week 3): pt will complete LB bathing sit to stand with min assist 2 consecutive sessions.  OT Short Term Goal 3 (Week 3): Pt will complete LB dressing sit to stand with mod assist for 2 consecutive sessions.  OT Short Term Goal 4 (Week 3): Pt will maintain standing with min guard assist while completing 2-3 grooming tasks.    Skilled Therapeutic Interventions/Progress Updates:    Pt transferred from EOB to wheelchair with min assist using the RW to start the session.  Rolled pt to the therapy gym for session.  Transfer to the therapy mat also with min assist.  Worked on scapular mobilizations and AROM bilaterally in sitting as well as supine.  Also completed lumbar mobilizations as well for increased elongation and extension through his trunk.  Educated pt's son on scapular adduction exercises with use of the theraband issued yesterday.  He expressed understanding and will continue exercises in the room.  Had pt transition to sitting from supine with min assist and mod instructional cueing.  Ambulated back to the room from the therapy gym with min assist with use of the RW.  He transferred to regular wheelchair with specialty back.  Family present to provide supervision while sitting up.  During session discussed discharge plan with spouse and son.  Son [name] plans on assistive with assist at discharge initially as well as other family.    Therapy Documentation Precautions:  Precautions Precautions: Fall Precaution Comments: B wrist restraints Restrictions Weight Bearing Restrictions: No  Vital  Signs: Therapy Vitals Temp: 98 F (36.7 C) Temp Source: Oral Pulse Rate: 78 Resp: 17 BP: 109/78 Patient Position (if appropriate): Sitting Oxygen Therapy SpO2: 100 % O2 Device: Not Delivered Pain: Pain Assessment Pain Assessment: No/denies pain ADL: See Function Navigator for Current Functional Status.   Therapy/Group: Individual Therapy  Stellah Donovan OTR/L 08/08/2016, 3:38 PM

## 2016-08-08 NOTE — Progress Notes (Signed)
Physical Therapy Session Note  Patient Details  Name: Gregory [name] MRN: [id number] Date of Birth: 08/02/1940  Today's Date: 08/08/2016 PT Individual Time: 1305-1400   PT Individual Time Caculation: 55min   Short Term Goals: Week 3:  PT Short Term Goal 1 (Week 3): Patient will consistently perform sit<>stand with min assist  PT Short Term Goal 2 (Week 3): Patient will perform stand pivot transfer with min assist using LRAD  PT Short Term Goal 3 (Week 3): Patient will ambulate 12400ft with min assist PT Short Term Goal 4 (Week 3): Pt will mainatin stantic standing balance with 1 UE supported and supervision assist up to 3 minutes.  PT Short Term Goal 5 (Week 3): Patient will ascesnd and desence 4, steps ( 6") with min assist    Skilled Therapeutic Interventions/Progress Updates:   Pt received sitting in WC and agreeable to PT. Rn present to finish tube feeding and medication administration. [facility]  Gait training with RW completed for 4790ft x2 with supervision assist in straight path and min assist in turns to assist with AD management and to prevent lateral LOB. Additional gait training with RW with supervision assist x 18250ft and min assist to motor planning to turn to sit on the EOB.   WC mobility in manual WC x 85 with min assist and moderate cues to increase use of the RUE to prevent veer to the R and maintain straight trajectory   Standing balance without UE support with supervision-min assist up to 2 minutes x 4 wit hmild posterior LOB. Pt able to correct posterior LOB with visual and verbal instruction for proper anteiror weight shift.   Transfers including sit<>stand and stand pivot completed with intermittent min-supervision assist with RW. Moderate cues from PT for proper sequencing and improved timing of anterior weight shift.   Patient returned to room and left sitting EOB with trade off to OT.           Therapy Documentation Precautions:   Precautions Precautions: Fall Precaution Comments: B wrist restraints Restrictions Weight Bearing Restrictions: No   Pain:   0/10    See Function Navigator for Current Functional Status.   Therapy/Group: Individual Therapy  Golden Popustin E Janean Eischen 08/08/2016, 2:04 PM

## 2016-08-08 NOTE — Progress Notes (Signed)
Speech Language Pathology Daily Session Note  Patient Details  Name: Gregory [name] MRN: [id number] Date of Birth: 1940/08/23  Today's Date: 08/08/2016 SLP Individual Time: [id number] SLP Individual Time Calculation (min): 45 min  Short Term Goals: Week 3: SLP Short Term Goal 1 (Week 3): Patient will consume trials of nectar-thick liquids via tsp without overt s/s of aspiration with Mod A multimodal cues for use of compensatory swallow strategies.  SLP Short Term Goal 2 (Week 3): Patient will increase speech intelligibility to ~75% at the sentence level with Min A cues for use of speech intelligibility strategies.  SLP Short Term Goal 3 (Week 3): Pt will demonstrate sustained attention to a functional task for ~ 5 minutes with Mod A verbal cues. for redirection to task.  SLP Short Term Goal 4 (Week 3): Pt will complete basic familiar tasks (related to ADLs, brushing hair, oral care) with Mod A verbal cues.   Skilled Therapeutic Interventions: Skilled treatment session focused on dysphagia and cognition goals. SLP received pt in bed, asleep with trach capped on room air. Pt easily alert/awake with severely wet voice.when cued, pt able to produce several strong coughs to clear voice. Pt had multiple distractions during session. Usually PO consumption occurs with door closed and only SLP and daugther present. This session pt experienced multiple unavoidable disrupts that were within frame of normal events. When consuming trials of nectar thick via spoon pt's cognitive deficits prevented him from safely consuming as evidenced by raising chin during swallow instead of maintaining chin tuck during entire swallow. Pt with increased overt s/s of aspiration - wet voice, throat clears and cough. Pt required Max cues to effectively clear. Extensive education provided to daughter on impact of cognition on swallow and goals for coming week. Pt required Max A multimodal cues to look at two picture cards and state  2 differences among cards. Pt was left in bed, daughter present, bed alarm on and all needs within reach. Continue per current plan of care.      Function:  Eating Eating   Modified Consistency Diet: Yes Eating Assist Level: More than reasonable amount of time;Set up assist for;Supervision or verbal cues;Helper scoops food on utensil;Helper brings food to mouth   Eating Set Up Assist For: Opening containers Helper Scoops Food on Utensil: Every scoop Helper Brings Food to Mouth: Every scoop   Cognition Comprehension Comprehension assist level: Understands basic 75 - 89% of the time/ requires cueing 10 - 24% of the time  Expression   Expression assist level: Expresses basic 75 - 89% of the time/requires cueing 10 - 24% of the time. Needs helper to occlude trach/needs to repeat words.  Social Interaction Social Interaction assist level: Interacts appropriately 75 - 89% of the time - Needs redirection for appropriate language or to initiate interaction.  Problem Solving Problem solving assist level: Solves basic 25 - 49% of the time - needs direction more than half the time to initiate, plan or complete simple activities  Memory Memory assist level: Recognizes or recalls 50 - 74% of the time/requires cueing 25 - 49% of the time    Pain Pain Assessment Pain Assessment: No/denies pain  Therapy/Group: Individual Therapy   Shah Insley B. Dreama Saaverton, M.S., CCC-SLP Speech-Language Pathologist   Esmond Hinch 08/08/2016, 12:41 PM
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
## 2016-08-08 NOTE — Progress Notes (Signed)
Subjective/Complaints: Recognizes me as MD but cannot remember my name Patient still with trach and G-tube. Has grabbed at the trach one or 2 times since wrist restraints have been discontinued, does have video monitor, which has been helpful. ROS: Limited due to cognition  Objective: Vital Signs: Blood pressure 138/78, pulse 79, temperature 97.9 F (36.6 C), temperature source Oral, resp. rate 16, height 5' 9" (1.753 m), weight 77 kg (169 lb 12.1 oz), SpO2 97 %. No results found. Results for orders placed or performed during the hospital encounter of 07/19/16 (from the past 72 hour(s))  Glucose, capillary     Status: Abnormal   Collection Time: 08/05/16 11:45 AM  Result Value Ref Range   Glucose-Capillary 115 (H) 65 - 99 mg/dL  Glucose, capillary     Status: None   Collection Time: 08/05/16  5:03 PM  Result Value Ref Range   Glucose-Capillary 81 65 - 99 mg/dL  Glucose, capillary     Status: Abnormal   Collection Time: 08/05/16  8:53 PM  Result Value Ref Range   Glucose-Capillary 110 (H) 65 - 99 mg/dL   Comment 1 Notify RN   Basic metabolic panel     Status: Abnormal   Collection Time: 08/06/16  5:48 AM  Result Value Ref Range   Sodium 139 135 - 145 mmol/L   Potassium 4.2 3.5 - 5.1 mmol/L   Chloride 99 (L) 101 - 111 mmol/L   CO2 30 22 - 32 mmol/L   Glucose, Bld 85 65 - 99 mg/dL   BUN 19 6 - 20 mg/dL   Creatinine, Ser 0.92 0.61 - 1.24 mg/dL   Calcium 9.0 8.9 - 10.3 mg/dL   GFR calc non Af Amer >60 >60 mL/min   GFR calc Af Amer >60 >60 mL/min    Comment: (NOTE) The eGFR has been calculated using the CKD EPI equation. This calculation has not been validated in all clinical situations. eGFR's persistently <60 mL/min signify possible Chronic Kidney Disease.    Anion gap 10 5 - 15  CBC     Status: Abnormal   Collection Time: 08/06/16  5:48 AM  Result Value Ref Range   WBC 5.7 4.0 - 10.5 K/uL   RBC 3.61 (L) 4.22 - 5.81 MIL/uL   Hemoglobin 9.8 (L) 13.0 - 17.0 g/dL   HCT  31.9 (L) 39.0 - 52.0 %   MCV 88.4 78.0 - 100.0 fL   MCH 27.1 26.0 - 34.0 pg   MCHC 30.7 30.0 - 36.0 g/dL   RDW 16.6 (H) 11.5 - 15.5 %   Platelets 265 150 - 400 K/uL  Glucose, capillary     Status: None   Collection Time: 08/06/16  6:43 AM  Result Value Ref Range   Glucose-Capillary 87 65 - 99 mg/dL  Urinalysis, Routine w reflex microscopic     Status: Abnormal   Collection Time: 08/06/16  4:02 PM  Result Value Ref Range   Color, Urine AMBER (A) YELLOW    Comment: BIOCHEMICALS MAY BE AFFECTED BY COLOR   APPearance CLOUDY (A) CLEAR   Specific Gravity, Urine 1.021 1.005 - 1.030   pH 5.0 5.0 - 8.0   Glucose, UA NEGATIVE NEGATIVE mg/dL   Hgb urine dipstick LARGE (A) NEGATIVE   Bilirubin Urine NEGATIVE NEGATIVE   Ketones, ur 5 (A) NEGATIVE mg/dL   Protein, ur 100 (A) NEGATIVE mg/dL   Nitrite NEGATIVE NEGATIVE   Leukocytes, UA MODERATE (A) NEGATIVE   RBC / HPF TOO NUMEROUS TO COUNT 0 -  5 RBC/hpf   WBC, UA 6-30 0 - 5 WBC/hpf   Bacteria, UA RARE (A) NONE SEEN   Squamous Epithelial / LPF NONE SEEN NONE SEEN   Mucous PRESENT    Hyaline Casts, UA PRESENT    Ca Oxalate Crys, UA PRESENT   Culture, Urine     Status: Abnormal   Collection Time: 08/06/16  4:02 PM  Result Value Ref Range   Specimen Description URINE, CATHETERIZED    Special Requests NONE    Culture >=100,000 COLONIES/mL STAPHYLOCOCCUS EPIDERMIDIS (A)    Report Status 08/08/2016 FINAL    Organism ID, Bacteria STAPHYLOCOCCUS EPIDERMIDIS (A)       Susceptibility   Staphylococcus epidermidis - MIC*    CIPROFLOXACIN >=8 RESISTANT Resistant     GENTAMICIN 8 INTERMEDIATE Intermediate     NITROFURANTOIN <=16 SENSITIVE Sensitive     OXACILLIN >=4 RESISTANT Resistant     TETRACYCLINE 2 SENSITIVE Sensitive     VANCOMYCIN 1 SENSITIVE Sensitive     TRIMETH/SULFA 160 RESISTANT Resistant     CLINDAMYCIN >=8 RESISTANT Resistant     RIFAMPIN <=0.5 SENSITIVE Sensitive     Inducible Clindamycin NEGATIVE Sensitive     * >=100,000  COLONIES/mL STAPHYLOCOCCUS EPIDERMIDIS  Glucose, capillary     Status: Abnormal   Collection Time: 08/06/16  5:14 PM  Result Value Ref Range   Glucose-Capillary 125 (H) 65 - 99 mg/dL  Glucose, capillary     Status: None   Collection Time: 08/06/16  9:47 PM  Result Value Ref Range   Glucose-Capillary 87 65 - 99 mg/dL   Comment 1 Notify RN   Glucose, capillary     Status: None   Collection Time: 08/07/16  6:54 AM  Result Value Ref Range   Glucose-Capillary 93 65 - 99 mg/dL  Glucose, capillary     Status: Abnormal   Collection Time: 08/07/16 11:28 AM  Result Value Ref Range   Glucose-Capillary 101 (H) 65 - 99 mg/dL  Glucose, capillary     Status: Abnormal   Collection Time: 08/07/16  4:31 PM  Result Value Ref Range   Glucose-Capillary 105 (H) 65 - 99 mg/dL   Comment 1 Notify RN   Glucose, capillary     Status: Abnormal   Collection Time: 08/07/16  9:17 PM  Result Value Ref Range   Glucose-Capillary 108 (H) 65 - 99 mg/dL  Glucose, capillary     Status: None   Collection Time: 08/08/16  6:31 AM  Result Value Ref Range   Glucose-Capillary 90 65 - 99 mg/dL     HEENT: Normocephalic. Healing trauma Neck: #6 shiley, significant secretions, white thick, no bloody streaks Cardio: RRR. No JVD.  Resp: Ronchi and Unlabored GI: BS positive and ND, PEG site clean, dry and intact. Nontender to palpation. No drainage  Skin:    , Motor: 4/5 in bue and BLE Musc/Skel:  No edema. Mild R rib tenderness.  Gen NAD, Vital signs reviewed.  GU Foley bag .golden colored urine  Assessment/Plan: 1. Functional deficits secondary to bilateral cortical and subcortical cardioembolic infarcts which require 3+ hours per day of interdisciplinary therapy in a comprehensive inpatient rehab setting. Physiatrist is providing close team supervision and 24 hour management of active medical problems listed below. Physiatrist and rehab team continue to assess barriers to discharge/monitor patient progress toward  functional and medical goals. FIM: Function - Bathing Bathing activity did not occur: Refused Position: Wheelchair/chair at sink Body parts bathed by patient: Right arm, Left arm, Chest, Abdomen,  Right upper leg, Left upper leg, Front perineal area, Right lower leg, Left lower leg Body parts bathed by helper: Back, Buttocks Bathing not applicable: Front perineal area, Buttocks Assist Level: Touching or steadying assistance(Pt > 75%)  Function- Upper Body Dressing/Undressing What is the patient wearing?: Pull over shirt/dress Pull over shirt/dress - Perfomed by patient: Thread/unthread right sleeve, Put head through opening Pull over shirt/dress - Perfomed by helper: Pull shirt over trunk Assist Level: Supervision or verbal cues Function - Lower Body Dressing/Undressing What is the patient wearing?: Pants, Socks, Shoes Position: Wheelchair/chair at [employer] - Performed by patient: Thread/unthread left underwear leg Pants- Performed by patient: Thread/unthread right pants leg, Thread/unthread left pants leg Pants- Performed by helper: Pull pants up/down Non-skid slipper socks- Performed by patient: Don/doff left sock Non-skid slipper socks- Performed by helper: Don/doff right sock Socks - Performed by patient: Don/doff right sock, Don/doff left sock Socks - Performed by helper: Don/doff right sock, Don/doff left sock Shoes - Performed by helper: Don/doff right shoe, Don/doff left shoe, Fasten right, Fasten left Assist for footwear: Maximal assist Assist for lower body dressing:  (MOD A)  Function - Toileting Toileting activity did not occur: No continent bowel/bladder event Toileting steps completed by patient: Adjust clothing after toileting Toileting steps completed by helper: Adjust clothing prior to toileting (Peri care not completed) Toileting Assistive Devices: Grab bar or rail Assist level: Touching or steadying assistance (Pt.75%)  Function - Air cabin crew  transfer activity did not occur:  (no bm. foley in place) Toilet transfer assistive device: Elevated toilet seat/BSC over toilet Mechanical lift: Stedy Assist level to toilet: Touching or steadying assistance (Pt > 75%) Assist level from toilet: Touching or steadying assistance (Pt > 75%) Assist level to bedside commode (at bedside): 2 helpers Assist level from bedside commode (at bedside): 2 helpers  Function - Chair/bed transfer Chair/bed transfer method: Stand pivot Chair/bed transfer assist level: Moderate assist (Pt 50 - 74%/lift or lower) Chair/bed transfer assistive device: Armrests Mechanical lift: Stedy Chair/bed transfer details: Verbal cues for technique, Tactile cues for posture, Verbal cues for sequencing, Manual facilitation for weight shifting, Manual facilitation for placement  Function - Locomotion: Wheelchair Will patient use wheelchair at discharge?: Yes Type: Manual Wheelchair activity did not occur: Safety/medical concerns Max wheelchair distance: 129f  Assist Level: Supervision or verbal cues Wheel 50 feet with 2 turns activity did not occur: Safety/medical concerns Assist Level: Supervision or verbal cues Wheel 150 feet activity did not occur: Safety/medical concerns Assist Level: Touching or steadying assistance (Pt > 75%) Turns around,maneuvers to table,bed, and toilet,negotiates 3% grade,maneuvers on rugs and over doorsills: No Function - Locomotion: Ambulation Ambulation activity did not occur: Safety/medical concerns Assistive device: Walker-rolling Max distance: 20 ft Assist level: Moderate assist (Pt 50 - 74%) Walk 10 feet activity did not occur: Safety/medical concerns Assist level: Moderate assist (Pt 50 - 74%) Walk 50 feet with 2 turns activity did not occur: Safety/medical concerns Assist level: Touching or steadying assistance (Pt > 75%) Walk 150 feet activity did not occur: Safety/medical concerns Walk 10 feet on uneven surfaces activity did  not occur: Safety/medical concerns  Function - Comprehension Comprehension: Auditory Comprehension assist level: Understands basic 75 - 89% of the time/ requires cueing 10 - 24% of the time  Function - Expression Expression: Verbal Expression assistive device: Talk trach valve Expression assist level: Expresses basic 75 - 89% of the time/requires cueing 10 - 24% of the time. Needs helper to occlude trach/needs to repeat words.  Function - Social Interaction Social Interaction assist level: Interacts appropriately 75 - 89% of the time - Needs redirection for appropriate language or to initiate interaction.  Function - Problem Solving Problem solving assist level: Solves basic 25 - 49% of the time - needs direction more than half the time to initiate, plan or complete simple activities  Function - Memory Memory assist level: Recognizes or recalls 25 - 49% of the time/requires cueing 50 - 75% of the time Patient normally able to recall (first 3 days only): Current season, That he or she is in a hospital, Staff names and faces  Medical Problem List and Plan: 1. 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 Formulized individual treatment plan based on medical history, underlying problem and comorbidities. 2. DVT Prophylaxis/Anticoagulation: Pharmaceutical: Other (comment)-- heparin, warfarin, or DOAC cause hematuria and hemoptysis  continue baby aspirin, 3. Pain Management: tylenol prn pain. Question muscle spasms--will monitor 4. Mood: LCSW to follow for evaluation as mentation improves.  5. Neuropsych: This patient is notcapable of making decisions on hisown behalf. 6. Skin/Wound Care: routine pressure relief measures.  7. Fluids/Electrolytes/Nutrition: Monitor  I/O and adjust fluid boluses as needed. Continue tube feeds.  BMP Latest Ref Rng & Units 08/06/2016 07/30/2016 07/23/2016  Glucose 65 - 99 mg/dL 85 101(H) 128(H)  BUN 6 - 20 mg/dL 19 14 21(H)  Creatinine 0.61 - 1.24 mg/dL 0.92 0.75 0.76  Sodium 135 - 145 mmol/L 139 138 136  Potassium 3.5 - 5.1 mmol/L 4.2 3.8 4.2  Chloride 101 - 111 mmol/L 99(L) 99(L) 99(L)  CO2 22 - 32 mmol/L 30 32 33(H)  Calcium 8.9 - 10.3 mg/dL 9.0 9.1 8.9  . 8. 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 ? Downsize vs decannulate per trach team  , cont scopolamine patch,, secretions improving, trying to keep hydration status a bit dry to help manage   .9. Parkinson's disease: Continue Sinemet 37.5/150 qid.  10. Leucocytosis: Resolved 11. ABLA: Continue to monitor-no acute drops, Stool guaic neg, microscopic hematuria CBC Latest Ref Rng & Units 08/06/2016 08/05/2016 08/04/2016  WBC 4.0 - 10.5 K/uL 5.7 7.5 6.2  Hemoglobin 13.0 - 17.0 g/dL 9.8(L) 10.9(L) 9.7(L)  Hematocrit 39.0 - 52.0 % 31.9(L) 35.9(L) 31.8(L)  Platelets 150 - 400 K/uL 265 358 300     12. Hematuria: resolved , Hemoglobin steady-           13. Severe dysphagia: NPO till mentation improv  tolerating PEG feedings, Some therapeutic feeds with speech therapy, recommend pt gets downsized prior to scheduling MBS  13.  UCx + staph epidermidis, likely skin contaminent, afebrile  UA with   neg nitrite, rare bacteria, no abx warranted at this time, will need to improve hydration, free water increased LOS (Days) 20 A FACE TO FACE EVALUATION WAS PERFORMED  , E 08/08/2016, 9:31 AM
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
## 2016-08-08 NOTE — Progress Notes (Signed)
Occupational Therapy Session Note  Patient Details  Name: Gregory [name] MRN: [id number] Date of Birth: Aug 06, 1940  Today's Date: 08/08/2016 OT Individual Time: [id number] OT Individual Time Calculation (min): 46 min    Short Term Goals: Week 3:  OT Short Term Goal 1 (Week 3): Pt will complete UB dressing sitting unsupported with supervision for 2 consecutive sessions.  OT Short Term Goal 2 (Week 3): pt will complete LB bathing sit to stand with min assist 2 consecutive sessions.  OT Short Term Goal 3 (Week 3): Pt will complete LB dressing sit to stand with mod assist for 2 consecutive sessions.  OT Short Term Goal 4 (Week 3): Pt will maintain standing with min guard assist while completing 2-3 grooming tasks.    Skilled Therapeutic Interventions/Progress Updates:    Pt transferred from supine to sit and then to the wheelchair with mod assist using the RW.  Still with increased posterior lean with sit to stand.  He was able to sit unsupported on the EOC for UB bathing and dressing with close supervision.  Still needs back support with increased time and occasional use of the UEs to assist with crossing LEs for washing feet and socks.  Slightly more difficulty with crossing the LLE over the right secondary to slight increased lean and weightbearing on the left hip.  Completed LB bathing with min assist sit to stand.  Decreased ability to reach his peri area with washing, using the LUE.  Min assist for threading brief and pants with him being able to pull them both over hips with min assist in standing.  Pt still with decreased orientation to place and time.  Pt left in wheelchair at end of session with daughter [name] in room for support.   Therapy Documentation Precautions:  Precautions Precautions: Fall Precaution Comments: B wrist restraints Restrictions Weight Bearing Restrictions: No  Vital Signs: Therapy Vitals Pulse Rate: 79 Resp: 16 Patient Position (if appropriate):  Lying Oxygen Therapy SpO2: 97 % O2 Device: Not Delivered Pain: Pain Assessment Pain Assessment: No/denies pain ADL: See Function Navigator for Current Functional Status.   Therapy/Group: Individual Therapy  Haydn Cush OTR/L 08/08/2016, 11:03 AM

## 2016-08-08 NOTE — Progress Notes (Signed)
08/08/2016- Respiratory care note- Pt declining use of CPAP. Resting comfortably, no distress

## 2016-08-08 NOTE — Progress Notes (Signed)
RT note: RT removed patients trach and covered stoma per MD order. Patient tolerated well vital signs stable. HR 71 respiratory rate 16 sats 100% on room air. Patients daughter at bedside.

## 2016-08-09 ENCOUNTER — Inpatient Hospital Stay (HOSPITAL_COMMUNITY): Payer: Medicare Other | Admitting: Speech Pathology

## 2016-08-09 ENCOUNTER — Inpatient Hospital Stay (HOSPITAL_COMMUNITY): Payer: Medicare Other | Admitting: Occupational Therapy

## 2016-08-09 ENCOUNTER — Inpatient Hospital Stay (HOSPITAL_COMMUNITY): Payer: Medicare Other | Admitting: Physical Therapy

## 2016-08-09 ENCOUNTER — Encounter: Payer: Self-pay | Admitting: Neurology

## 2016-08-09 LAB — GLUCOSE, CAPILLARY
GLUCOSE-CAPILLARY: 100 mg/dL — AB (ref 65–99)
Glucose-Capillary: 77 mg/dL (ref 65–99)
Glucose-Capillary: 83 mg/dL (ref 65–99)
Glucose-Capillary: 120 mg/dL — ABNORMAL HIGH (ref 65–99)

## 2016-08-09 NOTE — Progress Notes (Signed)
Speech Language Pathology Daily Session Note  Patient Details  Name: Gregory LoronRichard Odoherty MRN: [id number] Date of Birth: 09-Mar-1941  Today's Date: 08/09/2016 SLP Individual Time: 1345-1430 SLP Individual Time Calculation (min): 45 min  Short Term Goals: Week 3: SLP Short Term Goal 1 (Week 3): Patient will consume trials of nectar-thick liquids via tsp without overt s/s of aspiration with Mod A multimodal cues for use of compensatory swallow strategies.  SLP Short Term Goal 2 (Week 3): Patient will increase speech intelligibility to ~75% at the sentence level with Min A cues for use of speech intelligibility strategies.  SLP Short Term Goal 3 (Week 3): Pt will demonstrate sustained attention to a functional task for ~ 5 minutes with Mod A verbal cues. for redirection to task.  SLP Short Term Goal 4 (Week 3): Pt will complete basic familiar tasks (related to ADLs, brushing hair, oral care) with Mod A verbal cues.   Skilled Therapeutic Interventions: Skilled treatment session focused on dysphagia and cognition goals. SLP facilitated session by providing Max A multimodal cues for use of swallow strategies (specifically chin tuck followed by throat clear) while consuming nectar thick liquids via spoon. SLP attempted to fade cues to Mod A but pt unable to implement strategies without cue for each bolus. During periods of Mod A cues, pt would swallow while releasing chin tuck which resulted in immediate throat clear and wet voice. As PO intake approached 4 oz, pt with increased oral holding of bolus and increased throat clears and wet voice. Pt's swallow function continues to be impacted by cognitive deficits. Pt also required Mod A for sustained attention for ~ 30 minutes and pt continues to have off-topics and asked "who was the man standing behind him" (no man present). Daughter, Lowanda FosterBrittany, present and all questions answered to pt and her satisfaction. Extensive education provided to pt would not be consuming  "full diet" at discharge and that he would most likely be on modified diet. Plan to repeat Modified Barium Swallow Study next week. Pt was left upright in wheelchair, safety belt donned, daughter present and all needs within reach. Continue per current plan of care.   Function:  Eating Eating   Modified Consistency Diet: Yes (Trials of nectar thick with SLP and family members ) Eating Assist Level: More than reasonable amount of time;Supervision or verbal cues   Eating Set Up Assist For: Opening containers Helper Scoops Food on Utensil: Every scoop Helper Brings Food to Mouth: Every scoop   Cognition Comprehension Comprehension assist level: Understands basic 50 - 74% of the time/ requires cueing 25 - 49% of the time  Expression   Expression assist level: Expresses basic 75 - 89% of the time/requires cueing 10 - 24% of the time. Needs helper to occlude trach/needs to repeat words.  Social Interaction Social Interaction assist level: Interacts appropriately 75 - 89% of the time - Needs redirection for appropriate language or to initiate interaction.  Problem Solving Problem solving assist level: Solves basic 25 - 49% of the time - needs direction more than half the time to initiate, plan or complete simple activities  Memory Memory assist level: Recognizes or recalls 50 - 74% of the time/requires cueing 25 - 49% of the time    Pain    Therapy/Group: Individual Therapy   Kourtney Terriquez B. Dreama Saaverton, M.S., CCC-SLP Speech-Language Pathologist   Rhealyn Cullen 08/09/2016, 3:13 PM

## 2016-08-09 NOTE — Progress Notes (Addendum)
Subjective/Complaints: No issues after trach removal yesterday. No longer requiring wrist restraints. Still has a Foley and G-tube  ROS: Limited due to cognition  Objective: Vital Signs: Blood pressure 112/79, pulse 76, temperature 98 F (36.7 C), temperature source Oral, resp. rate 18, height 5\' 9"  (1.753 m), weight 77.5 kg (170 lb 13.7 oz), SpO2 100 %. No results found. Results for orders placed or performed during the hospital encounter of 07/19/16 (from the past 72 hour(s))  Urinalysis, Routine w reflex microscopic     Status: Abnormal   Collection Time: 08/06/16  4:02 PM  Result Value Ref Range   Color, Urine AMBER (A) YELLOW    Comment: BIOCHEMICALS MAY BE AFFECTED BY COLOR   APPearance CLOUDY (A) CLEAR   Specific Gravity, Urine 1.021 1.005 - 1.030   pH 5.0 5.0 - 8.0   Glucose, UA NEGATIVE NEGATIVE mg/dL   Hgb urine dipstick LARGE (A) NEGATIVE   Bilirubin Urine NEGATIVE NEGATIVE   Ketones, ur 5 (A) NEGATIVE mg/dL   Protein, ur 161 (A) NEGATIVE mg/dL   Nitrite NEGATIVE NEGATIVE   Leukocytes, UA MODERATE (A) NEGATIVE   RBC / HPF TOO NUMEROUS TO COUNT 0 - 5 RBC/hpf   WBC, UA 6-30 0 - 5 WBC/hpf   Bacteria, UA RARE (A) NONE SEEN   Squamous Epithelial / LPF NONE SEEN NONE SEEN   Mucous PRESENT    Hyaline Casts, UA PRESENT    Ca Oxalate Crys, UA PRESENT   Culture, Urine     Status: Abnormal   Collection Time: 08/06/16  4:02 PM  Result Value Ref Range   Specimen Description URINE, CATHETERIZED    Special Requests NONE    Culture >=100,000 COLONIES/mL STAPHYLOCOCCUS EPIDERMIDIS (A)    Report Status 08/08/2016 FINAL    Organism ID, Bacteria STAPHYLOCOCCUS EPIDERMIDIS (A)       Susceptibility   Staphylococcus epidermidis - MIC*    CIPROFLOXACIN >=8 RESISTANT Resistant     GENTAMICIN 8 INTERMEDIATE Intermediate     NITROFURANTOIN <=16 SENSITIVE Sensitive     OXACILLIN >=4 RESISTANT Resistant     TETRACYCLINE 2 SENSITIVE Sensitive     VANCOMYCIN 1 SENSITIVE Sensitive      TRIMETH/SULFA 160 RESISTANT Resistant     CLINDAMYCIN >=8 RESISTANT Resistant     RIFAMPIN <=0.5 SENSITIVE Sensitive     Inducible Clindamycin NEGATIVE Sensitive     * >=100,000 COLONIES/mL STAPHYLOCOCCUS EPIDERMIDIS  Glucose, capillary     Status: Abnormal   Collection Time: 08/06/16  5:14 PM  Result Value Ref Range   Glucose-Capillary 125 (H) 65 - 99 mg/dL  Glucose, capillary     Status: None   Collection Time: 08/06/16  9:47 PM  Result Value Ref Range   Glucose-Capillary 87 65 - 99 mg/dL   Comment 1 Notify RN   Glucose, capillary     Status: None   Collection Time: 08/07/16  6:54 AM  Result Value Ref Range   Glucose-Capillary 93 65 - 99 mg/dL  Glucose, capillary     Status: Abnormal   Collection Time: 08/07/16 11:28 AM  Result Value Ref Range   Glucose-Capillary 101 (H) 65 - 99 mg/dL  Glucose, capillary     Status: Abnormal   Collection Time: 08/07/16  4:31 PM  Result Value Ref Range   Glucose-Capillary 105 (H) 65 - 99 mg/dL   Comment 1 Notify RN   Glucose, capillary     Status: Abnormal   Collection Time: 08/07/16  9:17 PM  Result Value Ref  Range   Glucose-Capillary 108 (H) 65 - 99 mg/dL  Glucose, capillary     Status: None   Collection Time: 08/08/16  6:31 AM  Result Value Ref Range   Glucose-Capillary 90 65 - 99 mg/dL  Glucose, capillary     Status: None   Collection Time: 08/08/16 12:01 PM  Result Value Ref Range   Glucose-Capillary 87 65 - 99 mg/dL  Glucose, capillary     Status: None   Collection Time: 08/08/16  4:56 PM  Result Value Ref Range   Glucose-Capillary 95 65 - 99 mg/dL  Glucose, capillary     Status: Abnormal   Collection Time: 08/08/16  9:40 PM  Result Value Ref Range   Glucose-Capillary 102 (H) 65 - 99 mg/dL     HEENT: Normocephalic. Healing trauma Neck: #6 shiley, significant secretions, white thick, no bloody streaks Cardio: RRR. No JVD.  Resp: Ronchi and Unlabored GI: BS positive and ND, PEG site clean, dry and intact. Nontender to  palpation. No drainage  Skin:    , Motor: 4/5 in bue and BLE Musc/Skel:  No edema. Mild R rib tenderness.  Gen NAD, Vital signs reviewed.  GU Foley bag .golden colored urine  Assessment/Plan: 1. Functional deficits secondary to bilateral cortical and subcortical cardioembolic infarcts which require 3+ hours per day of interdisciplinary therapy in a comprehensive inpatient rehab setting. Physiatrist is providing close team supervision and 24 hour management of active medical problems listed below. Physiatrist and rehab team continue to assess barriers to discharge/monitor patient progress toward functional and medical goals. FIM: Function - Bathing Bathing activity did not occur: Refused Position: Wheelchair/chair at sink Body parts bathed by patient: Right arm, Left arm, Chest, Abdomen, Right upper leg, Left upper leg, Front perineal area, Right lower leg, Left lower leg Body parts bathed by helper: Buttocks, Back Bathing not applicable: Front perineal area, Buttocks Assist Level: Touching or steadying assistance(Pt > 75%)  Function- Upper Body Dressing/Undressing What is the patient wearing?: Pull over shirt/dress Pull over shirt/dress - Perfomed by patient: Thread/unthread right sleeve, Put head through opening, Thread/unthread left sleeve Pull over shirt/dress - Perfomed by helper: Pull shirt over trunk Assist Level: Supervision or verbal cues Function - Lower Body Dressing/Undressing What is the patient wearing?: Pants, Socks, Shoes Position: Wheelchair/chair at [employer] Underwear - Performed by patient: Thread/unthread left underwear leg Pants- Performed by patient: Thread/unthread right pants leg, Thread/unthread left pants leg Pants- Performed by helper: Pull pants up/down Non-skid slipper socks- Performed by patient: Don/doff left sock Non-skid slipper socks- Performed by helper: Don/doff right sock Socks - Performed by patient: Don/doff right sock, Don/doff left sock Socks -  Performed by helper: Don/doff right sock, Don/doff left sock Shoes - Performed by helper: Don/doff right shoe, Don/doff left shoe, Fasten right, Fasten left Assist for footwear: Maximal assist Assist for lower body dressing:  (MOD A)  Function - Toileting Toileting activity did not occur: No continent bowel/bladder event Toileting steps completed by patient: Adjust clothing after toileting Toileting steps completed by helper: Adjust clothing prior to toileting, Performs perineal hygiene, Adjust clothing after toileting Toileting Assistive Devices: Grab bar or rail Assist level: Touching or steadying assistance (Pt.75%)  Function - ArchivistToilet Transfers Toilet transfer activity did not occur:  (no bm. foley in place) Toilet transfer assistive device: Elevated toilet seat/BSC over toilet Mechanical lift: Stedy Assist level to toilet: Touching or steadying assistance (Pt > 75%) Assist level from toilet: Touching or steadying assistance (Pt > 75%) Assist level to bedside commode (  at bedside): 2 helpers Assist level from bedside commode (at bedside): 2 helpers  Function - Chair/bed transfer Chair/bed transfer method: Stand pivot Chair/bed transfer assist level: Supervision or verbal cues Chair/bed transfer assistive device: Armrests, Biochemist, clinical lift: Stedy Chair/bed transfer details: Verbal cues for technique, Tactile cues for posture, Verbal cues for sequencing, Manual facilitation for weight shifting, Manual facilitation for placement  Function - Locomotion: Wheelchair Will patient use wheelchair at discharge?: Yes Type: Manual Wheelchair activity did not occur: Safety/medical concerns Max wheelchair distance: 35ft  Assist Level: Supervision or verbal cues Wheel 50 feet with 2 turns activity did not occur: Safety/medical concerns Assist Level: Supervision or verbal cues Wheel 150 feet activity did not occur: Safety/medical concerns Assist Level: Touching or steadying assistance  (Pt > 75%) Turns around,maneuvers to table,bed, and toilet,negotiates 3% grade,maneuvers on rugs and over doorsills: No Function - Locomotion: Ambulation Ambulation activity did not occur: Safety/medical concerns Assistive device: Walker-rolling Max distance: 130ft  Assist level: Touching or steadying assistance (Pt > 75%) Walk 10 feet activity did not occur: Safety/medical concerns Assist level: Supervision or verbal cues Walk 50 feet with 2 turns activity did not occur: Safety/medical concerns Assist level: Supervision or verbal cues Walk 150 feet activity did not occur: Safety/medical concerns Assist level: Touching or steadying assistance (Pt > 75%) Walk 10 feet on uneven surfaces activity did not occur: Safety/medical concerns  Function - Comprehension Comprehension: Auditory Comprehension assist level: Understands basic 75 - 89% of the time/ requires cueing 10 - 24% of the time  Function - Expression Expression: Verbal Expression assistive device: Talk trach valve Expression assist level: Expresses basic 75 - 89% of the time/requires cueing 10 - 24% of the time. Needs helper to occlude trach/needs to repeat words.  Function - Social Interaction Social Interaction assist level: Interacts appropriately 75 - 89% of the time - Needs redirection for appropriate language or to initiate interaction.  Function - Problem Solving Problem solving assist level: Solves basic 25 - 49% of the time - needs direction more than half the time to initiate, plan or complete simple activities  Function - Memory Memory assist level: Recognizes or recalls 50 - 74% of the time/requires cueing 25 - 49% of the time Patient normally able to recall (first 3 days only): Current season, That he or she is in a hospital, Staff names and faces  Medical Problem List and Plan: 1. Cognitive deficits, severe dysphagia and gait disorder secondary to bilateral cardioembolic infarcts   Cont CIR PT, OT, SLP-family  will need to learn to manage G tube and probably foley2. DVT Prophylaxis/Anticoagulation: Pharmaceutical: Other (comment)-- heparin, warfarin, or DOAC cause hematuria and hemoptysis  continue baby aspirin, 3. Pain Management: tylenol prn pain. Question muscle spasms--will monitor 4. Mood: LCSW to follow for evaluation as mentation improves.  5. Neuropsych: This patient is notcapable of making decisions on hisown behalf. 6. Skin/Wound Care: routine pressure relief measures.  7. Fluids/Electrolytes/Nutrition: Monitor I/O and adjust fluid boluses as needed. Continue tube feeds.  BMP Latest Ref Rng & Units 08/06/2016 07/30/2016 07/23/2016  Glucose 65 - 99 mg/dL 85 161(W) 960(A)  BUN 6 - 20 mg/dL 19 14 54(U)  Creatinine 0.61 - 1.24 mg/dL 9.81 1.91 4.78  Sodium 135 - 145 mmol/L 139 138 136  Potassium 3.5 - 5.1 mmol/L 4.2 3.8 4.2  Chloride 101 - 111 mmol/L 99(L) 99(L) 99(L)  CO2 22 - 32 mmol/L 30 32 33(H)  Calcium 8.9 - 10.3 mg/dL 9.0 9.1 8.9  . 8. Tracheostomy  d/ced per CCM    .9. Parkinson's disease: Continue Sinemet 37.5/150 qid.  10. Leucocytosis: Resolved 11. ABLA: Continue to monitor-no acute drops, Stool guaic neg, microscopic hematuria CBC Latest Ref Rng & Units 08/06/2016 08/05/2016 08/04/2016  WBC 4.0 - 10.5 K/uL 5.7 7.5 6.2  Hemoglobin 13.0 - 17.0 g/dL 1.6(X) 10.9(L) 9.7(L)  Hematocrit 39.0 - 52.0 % 31.9(L) 35.9(L) 31.8(L)  Platelets 150 - 400 K/uL 265 358 300     12. Hematuria: resolved , Hemoglobin steady-           13. Severe dysphagia: NPO till mentation improv  tolerating PEG feedings, Some therapeutic feeds with speech therapy, pt now decannulated 13.  UCx + staph epidermidis, likely skin contaminent, afebrile  UA with   neg nitrite, rare bacteria, no abx warranted at this time, will need to improve hydration, free water increased LOS (Days) 21 A FACE TO FACE EVALUATION WAS PERFORMED  Logen Fowle E 08/09/2016, 6:45 AM

## 2016-08-09 NOTE — Progress Notes (Signed)
Discussed need of tele sitter with A.Joyce.  Feel pt is safe at this time. Called D. Anguilli PA-C, rec'd verbal order to d/c tele sitter. Monitor unplugged and placed outside of room.

## 2016-08-09 NOTE — Progress Notes (Signed)
Occupational Therapy Session Note  Patient Details  Name: Gregory [name] MRN: [id number] Date of Birth: 02-19-1941  Today's Date: 08/09/2016 OT Individual Time: 0906-1006 OT Individual Time Calculation (min): 60 min    Short Term Goals: Week 3:  OT Short Term Goal 1 (Week 3): Pt will complete UB dressing sitting unsupported with supervision for 2 consecutive sessions.  OT Short Term Goal 2 (Week 3): pt will complete LB bathing sit to stand with min assist 2 consecutive sessions.  OT Short Term Goal 3 (Week 3): Pt will complete LB dressing sit to stand with mod assist for 2 consecutive sessions.  OT Short Term Goal 4 (Week 3): Pt will maintain standing with min guard assist while completing 2-3 grooming tasks.    Skilled Therapeutic Interventions/Progress Updates:    Pt completed bathing and dressing during session.  Mod assist for transfer to the walk-in shower using the RW for support.  Increased LOB posteriorly in standing with mobility.  Max instructional cueing for sequencing bathing in the shower sit to stand, including needing cueing for use of soap and min assist for crossing LEs over the opposite knee to wash.  He was able to wash his hair with min assist for right shoulder flexion to reach the back of his head.  Mod assist for sit to stand to wash peri area secondary to increased posterior weightshift and lean.  Dressing sit to stand from the wheelchair.  Pt needing mod instructional cueing to maintain sustained attention to dressing tasks secondary to internal and external distractions.  [name] [name] present for dressing as well and provided larger shoes to try this session as well.  Mod demonstrational cueing for orientation of clothing.  Once oriented, he needed min assist to donn.  Min assist for donning socks and shoes including tying them.  Pt left in wheelchair with daughter present to complete grooming tasks.    Therapy Documentation Precautions:   Precautions Precautions: Fall Precaution Comments: B wrist restraints Restrictions Weight Bearing Restrictions: No  Pain: Pain Assessment Pain Assessment: No/denies pain ADL: See Function Navigator for Current Functional Status.   Therapy/Group: Individual Therapy  Daneille Desilva OTR/L 08/09/2016, 12:14 PM

## 2016-08-09 NOTE — Progress Notes (Signed)
Physical Therapy Session Note  Patient Details  Name: Gregory [name] MRN: [id number] Date of Birth: [birth year]  Today's Date: 08/09/2016  PT Individual Time: 1100-1200 AND 1500-1530 PT Individual Time Calculation (min): 60 min AND 30 min   Short Term Goals: Week 3:  PT Short Term Goal 1 (Week 3): Patient will consistently perform sit<>stand with min assist  PT Short Term Goal 2 (Week 3): Patient will perform stand pivot transfer with min assist using LRAD  PT Short Term Goal 3 (Week 3): Patient will ambulate 182f with min assist PT Short Term Goal 4 (Week 3): Pt will mainatin stantic standing balance with 1 UE supported and supervision assist up to 3 minutes.  PT Short Term Goal 5 (Week 3): Patient will ascesnd and desence 4, steps ( 6") with min assist  Week 4:     Skilled Therapeutic Interventions/Progress Updates:   Pt received sitting in WC and agreeable to PT  WC mobility with BUE and BLE propulsion technique and supervision assist from PT. Completed for 2 bouts of 1038feach and moderate-max cues for improved use of BLE to prevent veering to the L R. PT fit pt for 18x18 WC to assess pt's ability to tolerate narrow WC due to doorway restrictions in house. Pt noted to fit appropriately in 18x18 WC.   Gait with RW. Completed x 3 at [id number] supervision assist from PT and constant cues to prevent festinating gait. Verbal instruction from PT also to improve heel strike on 3rd bout of gait training. Noted improvement in consistency of step through gait pattern following instruction of increased heel contact.   nustep endurance and reciprocal movement training x 10 minutes at level 4. Moderate verbal and tactile instruction for increased ROM on BLE throughout to enforce increased amplitude of movements.   Transfers instructed by PT throughout treatment session including sit<>stand with min assist progressing to supervision assist with moderate verbal instructio to  improved head and trunk positioning prior to transfers. Stand pivot transfer performed with intermittent min-supervision assist from PT. Moderate cues for gait pattern and AD management.   Session 2:  Gait training through hall and over carper in day room 3120ft 42fassist from PT while ambulating over carpet. Moderate verbal and visual cues for increased step length and increased heel contact to prevent foot drag and improved safety of gait on carpet. Pt's family confirmed that all carpet and rugs in the house have already been removed.    Standing balance to preform cognitive task of pipe tree x 5 minutes with overall supervision assist from PT. Intermittent 0-1 UE support on tray table at elevated height. Moderate cues for refer to reference picture and problem solving to locate appropriate connector pieces.   Patient returned to room and [facility] and all needs met.          Therapy Documentation Precautions:  Precautions Precautions: Fall Precaution Comments: B wrist restraints Restrictions Weight Bearing Restrictions: No Vital Signs: Therapy Vitals Temp: 98.8 F (37.1 C) Temp Source: Oral Pulse Rate: (!) 55 Resp: 18 BP: (!) 97/48 Patient Position (if appropriate): Sitting Oxygen Therapy SpO2: 100 % O2 Device: Not Delivered Pain: 0/10   See Function Navigator for Current Functional Status.   Therapy/Group: Individual Therapy  AustinLorie Phenix2018, 1:48 PM

## 2016-08-10 ENCOUNTER — Inpatient Hospital Stay (HOSPITAL_COMMUNITY): Payer: Medicare Other | Admitting: Physical Therapy

## 2016-08-10 ENCOUNTER — Inpatient Hospital Stay (HOSPITAL_COMMUNITY): Payer: Medicare Other | Admitting: Occupational Therapy

## 2016-08-10 ENCOUNTER — Inpatient Hospital Stay (HOSPITAL_COMMUNITY): Payer: Medicare Other | Admitting: Speech Pathology

## 2016-08-10 LAB — GLUCOSE, CAPILLARY
GLUCOSE-CAPILLARY: 86 mg/dL (ref 65–99)
GLUCOSE-CAPILLARY: 94 mg/dL (ref 65–99)
Glucose-Capillary: 115 mg/dL — ABNORMAL HIGH (ref 65–99)
Glucose-Capillary: 119 mg/dL — ABNORMAL HIGH (ref 65–99)

## 2016-08-10 NOTE — Progress Notes (Signed)
Occupational Therapy Weekly Progress Note  Patient Details  Name: Gregory [name] MRN: [id number] Date of Birth: 1941/03/20  Beginning of progress report period: August 04, 2016 End of progress report period: August 10, 2016  Today's Date: 08/10/2016 OT Individual Time: 3794-3276 OT Individual Time Calculation (min): 41 min    Patient has met 2 of 4 short term goals.  Mr. Frutiger continues to make steady progress with OT.  Overall he can complete UB selfcare at a supervision to min assist level for bathing and dressing.  LB selfcare min to mod assist sit to stand.  Still with increased posterior LOB with standing and dynamic balance tasks.  He continues to demonstrate limited BUE shoulder AROM for peri care as well as overhead reaching for washing and brushing his hair, but he can complete both with min assist.  Decreased ability to cross LEs over the opposite knee for washing and donning clothing at times, especially the LLE.  Endurance and balance continue to improve, however he still exhibits decreased forward weight shift and hip extension with transition from sit to stand.  Rounded upper thoracic region with abducted scapulas are noted as well as cervical protraction and limited cervical extension.  Pt continues to demonstrate decreased sequencing at times as well as decreased selective attention during bathing and dressing tasks as well.  Recommend continued CIR level therapies with expected discharge 08/17/16.    Patient continues to demonstrate the following deficits: muscle weakness, decreased cardiorespiratoy endurance, impaired timing and sequencing and unbalanced muscle activation, decreased attention, decreased awareness and decreased memory and decreased sitting balance, decreased standing balance, hemiplegia and decreased balance strategies and therefore will continue to benefit from skilled OT intervention to enhance overall performance with BADL and Reduce care partner  burden.  Patient progressing toward long term goals..  Continue plan of care.  OT Short Term Goals Week 4:  OT Short Term Goal 1 (Week 4): Continue working on established min assist to supervision level goals for discharge.   Skilled Therapeutic Interventions/Progress Updates:    Pt ambulated to the therapy gym with min assist during session, including initial sit to stand from the wheelchair.  Still with decreased forward weight shift and pelvic translation noted with attempted standing.  Once in the gym completed 3 sets of 5 repetitions for shoulder flexion using 1lb dowel rod and min assist in unsupported sitting.  Min assist needed for AAROM greater than 100 degrees flexion.  Pt only able to achieve 120-130 degrees with AAROM secondary to posture.  Worked on scapular retraction exercises with min assist and lumbar extension stretching in sitting.  Therapist provided manual facilitation for cervical rotation to both sides as well as cervical extension.  Also AAROM to trunk rotations to both sides as well.  Family present during session as well.  Ambulated back to the room for conclusion of session with pt left in wheelchair with safety belt in place.    Therapy Documentation Precautions:  Precautions Precautions: Fall Precaution Comments: B wrist restraints Restrictions Weight Bearing Restrictions: No  Pain: Pain Assessment Pain Assessment: No/denies pain ADL: See Function Navigator for Current Functional Status.   Therapy/Group: Individual Therapy  Albirda Shiel OTR/L 08/10/2016, 4:23 PM

## 2016-08-10 NOTE — Progress Notes (Signed)
Occupational Therapy Session Note  Patient Details  Name: Gregory [name] MRN: [id number] Date of Birth: [birth year]  Today's Date: 08/10/2016 OT Individual Time: [id number] OT Individual Time Calculation (min): 58 min    Short Term Goals: Week 3:  OT Short Term Goal 1 (Week 3): Pt will complete UB dressing sitting unsupported with supervision for 2 consecutive sessions.  OT Short Term Goal 2 (Week 3): pt will complete LB bathing sit to stand with min assist 2 consecutive sessions.  OT Short Term Goal 3 (Week 3): Pt will complete LB dressing sit to stand with mod assist for 2 consecutive sessions.  OT Short Term Goal 4 (Week 3): Pt will maintain standing with min guard assist while completing 2-3 grooming tasks.    Skilled Therapeutic Interventions/Progress Updates:   pt completed shower and dressing during session.  Mod assist for transition to sitting from supine.  Min assist for mobility to the shower bench with use of the RW and mod instructional cueing for taking larger steps.  Mod instructional cueing for sequencing shower this am with min assist for sit to stand when washing peri area.  Still with decreased AROM shoulder internal rotation for cleaning peri area efficiently, so needs assistance with this.  Completed dressing sit to stand at the sink.  Mod assist for crossing the LLE over the right knee for donning all clothing.  Min assist for crossing the right or lifting it high enough to place her leg in the pants leg.  Max assist for donning shoes over heels and for tying them.  Finished session with pt in wheelchair with daughter Gregory [name] present for supervision.     Therapy Documentation Precautions:  Precautions Precautions: Fall Precaution Comments: B wrist restraints Restrictions Weight Bearing Restrictions: No  Pain: Pain Assessment Pain Assessment: No/denies pain ADL: See Function Navigator for Current Functional Status.   Therapy/Group: Individual  Therapy  Oscar Forman OTR/L 08/10/2016, 12:32 PM

## 2016-08-10 NOTE — Progress Notes (Signed)
Patient refuses CPAP for tonight.  

## 2016-08-10 NOTE — Progress Notes (Signed)
Speech Language Pathology Weekly Progress and Session Note  Patient Details  Name: Gregory [name] MRN: [id number] Date of Birth: 09-27-1940  Beginning of progress report period: August 03, 2016 End of progress report period: August 10, 2016  Today's Date: 08/10/2016 SLP Individual Time: 1130-1200 SLP Individual Time Calculation (min): 30 min  Short Term Goals: Week 3: SLP Short Term Goal 1 (Week 3): Patient will consume trials of nectar-thick liquids via tsp without overt s/s of aspiration with Mod A multimodal cues for use of compensatory swallow strategies.  SLP Short Term Goal 1 - Progress (Week 3): Not met SLP Short Term Goal 2 (Week 3): Patient will increase speech intelligibility to ~75% at the sentence level with Min A cues for use of speech intelligibility strategies.  SLP Short Term Goal 2 - Progress (Week 3): Met SLP Short Term Goal 3 (Week 3): Pt will demonstrate sustained attention to a functional task for ~ 5 minutes with Mod A verbal cues. for redirection to task.  SLP Short Term Goal 3 - Progress (Week 3): Not met SLP Short Term Goal 4 (Week 3): Pt will complete basic familiar tasks (related to ADLs, brushing hair, oral care) with Mod A verbal cues.  SLP Short Term Goal 4 - Progress (Week 3): Met    New Short Term Goals: Week 4: SLP Short Term Goal 1 (Week 4): Patient will consume trials of nectar-thick liquids via tsp without overt s/s of aspiration with Mod A multimodal cues for use of compensatory swallow strategies.  SLP Short Term Goal 2 (Week 4): Pt will demonstrate sustained attention to a functional task for ~ 5 minutes with Mod A verbal cues. for redirection to task.  SLP Short Term Goal 3 (Week 4): Pt will utilize external memory aids for recall of dialy information with Mod A verbal cues.  SLP Short Term Goal 4 (Week 4): Pt will solve basic familiar problems with Mod A verbal cues.  SLP Short Term Goal 5 (Week 4): With Max A multimodal cues, pt will state 1  cognitve deficit and 1 physical deficits related to current situation.   Weekly Progress Updates: Pt with slow steady progress towards goals as evidenced by meeting 2 of 4 STGs/ Pt with progress in the areas of speech intelligibility and completing basic tasks related to ADLs. Pt continues to require Max A verbal cues for sustained attention to functional tasks ~ 5 minutes. Pt requires Max multimodal cues for use of compensatory swallow strategies when consuming nectar thick liquids via spoon. Without use of compensatory swallow strategies, pt develops wet voice and immediate throat clear. Much time during this reporting period was dedicated education geared towards discharge planning such as providing 24 hour supervision. Pt continues to require skilled ST to address these areas to increase functional independence and reduce caregiver burden. Continue per current plan of care.      Intensity: Minumum of 1-2 x/day, 30 to 90 minutes Frequency: 3 to 5 out of 7 days Duration/Length of Stay: 08/17/16 Treatment/Interventions: Cognitive remediation/compensation;Cueing hierarchy;Dysphagia/aspiration precaution training;Environmental controls;Therapeutic Activities;Internal/external aids;Speech/Language facilitation;Functional tasks;Patient/family education   Daily Session  Skilled Therapeutic Interventions: Skilled treatment session focused on cognition goals. SLP facilitated session by administering MOCA version 8.1. Pt obtained a score of 12 out of 30 with n=>26. Daughter present during administration and education provided to daughter on level of cognitive deficits and cognitive impact on prognosis of diet upgrade/implementation of compensatory swallow strategies. Daughter voiced understanding.      Function:  Cognition Comprehension Comprehension assist level: Understands basic 50 - 74% of the time/ requires cueing 25 - 49% of the time  Expression   Expression assist level: Expresses basic 75 -  89% of the time/requires cueing 10 - 24% of the time. Needs helper to occlude trach/needs to repeat words.  Social Interaction Social Interaction assist level: Interacts appropriately 75 - 89% of the time - Needs redirection for appropriate language or to initiate interaction.  Problem Solving Problem solving assist level: Solves basic 25 - 49% of the time - needs direction more than half the time to initiate, plan or complete simple activities  Memory Memory assist level: Recognizes or recalls 25 - 49% of the time/requires cueing 50 - 75% of the time   General    Pain    Therapy/Group: Individual Therapy   Maahi Lannan B. Rutherford Nail, M.S., CCC-SLP Speech-Language Pathologist   Ruford Dudzinski 08/10/2016, 12:21 PM

## 2016-08-10 NOTE — Progress Notes (Signed)
Daughter administered medications and bolus feeds via PEG tube. Daughter verbalized understanding of instructions.
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
## 2016-08-10 NOTE — Progress Notes (Signed)
Social Work Patient ID: Francisca December, male   DOB: [birth year], 76 y.o.   MRN: [id number]   CSW met with pt, wife, and son 08-08-16 to update them on team conference discussion while pt was working in [place].  Targeted d/c date remains 08-17-16 and we began discussing DME and HH and wife's ability to help pt more now, as she is out of her knee brace.  She is able to do stairs now and hopes by the time pt is discharged, that she will be able to be upstairs in her bedroom to give pt the downstairs room she has been using.  Also talked to pt's dtr today and we plan to meet on Monday, 08-13-16, to finalize d/c plans.  CSW will continue to follow and assist as needed.

## 2016-08-10 NOTE — Progress Notes (Signed)
Subjective/Complaints:  Discussed voiding trial with patient. Plan for next week. If okay with urology ROS: Limited due to cognition  Objective: Vital Signs: Blood pressure 101/61, pulse 67, temperature 98.6 F (37 C), temperature source Oral, resp. rate 19, height 5\' 9"  (1.753 m), weight 78.6 kg (173 lb 4.5 oz), SpO2 99 %. No results found. Results for orders placed or performed during the hospital encounter of 07/19/16 (from the past 72 hour(s))  Glucose, capillary     Status: None   Collection Time: 08/07/16  6:54 AM  Result Value Ref Range   Glucose-Capillary 93 65 - 99 mg/dL  Glucose, capillary     Status: Abnormal   Collection Time: 08/07/16 11:28 AM  Result Value Ref Range   Glucose-Capillary 101 (H) 65 - 99 mg/dL  Glucose, capillary     Status: Abnormal   Collection Time: 08/07/16  4:31 PM  Result Value Ref Range   Glucose-Capillary 105 (H) 65 - 99 mg/dL   Comment 1 Notify RN   Glucose, capillary     Status: Abnormal   Collection Time: 08/07/16  9:17 PM  Result Value Ref Range   Glucose-Capillary 108 (H) 65 - 99 mg/dL  Glucose, capillary     Status: None   Collection Time: 08/08/16  6:31 AM  Result Value Ref Range   Glucose-Capillary 90 65 - 99 mg/dL  Glucose, capillary     Status: None   Collection Time: 08/08/16 12:01 PM  Result Value Ref Range   Glucose-Capillary 87 65 - 99 mg/dL  Glucose, capillary     Status: None   Collection Time: 08/08/16  4:56 PM  Result Value Ref Range   Glucose-Capillary 95 65 - 99 mg/dL  Glucose, capillary     Status: Abnormal   Collection Time: 08/08/16  9:40 PM  Result Value Ref Range   Glucose-Capillary 102 (H) 65 - 99 mg/dL  Glucose, capillary     Status: None   Collection Time: 08/09/16  6:52 AM  Result Value Ref Range   Glucose-Capillary 77 65 - 99 mg/dL  Glucose, capillary     Status: None   Collection Time: 08/09/16  1:10 PM  Result Value Ref Range   Glucose-Capillary 83 65 - 99 mg/dL  Glucose, capillary     Status:  Abnormal   Collection Time: 08/09/16  4:20 PM  Result Value Ref Range   Glucose-Capillary 100 (H) 65 - 99 mg/dL   Comment 1 Notify RN   Glucose, capillary     Status: Abnormal   Collection Time: 08/09/16  9:25 PM  Result Value Ref Range   Glucose-Capillary 120 (H) 65 - 99 mg/dL  Glucose, capillary     Status: None   Collection Time: 08/10/16  6:15 AM  Result Value Ref Range   Glucose-Capillary 86 65 - 99 mg/dL     HEENT: Normocephalic. Healing trauma Neck: #6 shiley, significant secretions, white thick, no bloody streaks Cardio: RRR. No JVD.  Resp: Ronchi and Unlabored GI: BS positive and ND, PEG site clean, dry and intact. Nontender to palpation. No drainage  Skin:    , Motor: 4/5 in bue and BLE Musc/Skel:  No edema. Mild R rib tenderness.  Gen NAD, Vital signs reviewed.  GU Foley bag .golden colored urine  Assessment/Plan: 1. Functional deficits secondary to bilateral cortical and subcortical cardioembolic infarcts which require 3+ hours per day of interdisciplinary therapy in a comprehensive inpatient rehab setting. Physiatrist is providing close team supervision and 24 hour management of active  medical problems listed below. Physiatrist and rehab team continue to assess barriers to discharge/monitor patient progress toward functional and medical goals. FIM: Function - Bathing Bathing activity did not occur: Refused Position: Shower Body parts bathed by patient: Right arm, Left arm, Chest, Abdomen, Right upper leg, Left upper leg, Front perineal area, Right lower leg, Left lower leg Body parts bathed by helper: Buttocks, Back Bathing not applicable: Front perineal area, Buttocks Assist Level: Touching or steadying assistance(Pt > 75%)  Function- Upper Body Dressing/Undressing What is the patient wearing?: Pull over shirt/dress Pull over shirt/dress - Perfomed by patient: Thread/unthread right sleeve, Put head through opening, Thread/unthread left sleeve Pull over  shirt/dress - Perfomed by helper: Pull shirt over trunk Assist Level: Supervision or verbal cues Function - Lower Body Dressing/Undressing What is the patient wearing?: Pants, Socks, Shoes Position: Wheelchair/chair at sink Underwear - Performed by patient: Thread/unthread left underwear leg Pants- Performed by patient: Thread/unthread right pants leg, Thread/unthread left pants leg Pants- Performed by helper: Pull pants up/down Non-skid slipper socks- Performed by patient: Don/doff left sock Non-skid slipper socks- Performed by helper: Don/doff right sock Socks - Performed by patient: Don/doff right sock, Don/doff left sock Socks - Performed by helper: Don/doff right sock, Don/doff left sock Shoes - Performed by patient: Don/doff right shoe, Don/doff left shoe, Fasten right Shoes - Performed by helper: Fasten left Assist for footwear: Maximal assist Assist for lower body dressing:  (MOD A)  Function - Toileting Toileting activity did not occur: No continent bowel/bladder event Toileting steps completed by patient: Adjust clothing after toileting Toileting steps completed by helper: Adjust clothing prior to toileting, Performs perineal hygiene, Adjust clothing after toileting Toileting Assistive Devices: Grab bar or rail Assist level: Touching or steadying assistance (Pt.75%)  Function - Archivist transfer activity did not occur:  (no bm. foley in place) Toilet transfer assistive device: Elevated toilet seat/BSC over toilet Mechanical lift: Stedy Assist level to toilet: Touching or steadying assistance (Pt > 75%) Assist level from toilet: Touching or steadying assistance (Pt > 75%) Assist level to bedside commode (at bedside): 2 helpers Assist level from bedside commode (at bedside): 2 helpers  Function - Chair/bed transfer Chair/bed transfer method: Stand pivot Chair/bed transfer assist level: Touching or steadying assistance (Pt > 75%) Chair/bed transfer assistive  device: Armrests, Walker Mechanical lift: Stedy Chair/bed transfer details: Verbal cues for technique, Tactile cues for posture, Verbal cues for sequencing, Manual facilitation for weight shifting, Manual facilitation for placement  Function - Locomotion: Wheelchair Will patient use wheelchair at discharge?: Yes Type: Manual Wheelchair activity did not occur: Safety/medical concerns Max wheelchair distance: 162ft  Assist Level: Supervision or verbal cues Wheel 50 feet with 2 turns activity did not occur: Safety/medical concerns Assist Level: Supervision or verbal cues Wheel 150 feet activity did not occur: Safety/medical concerns Assist Level: Touching or steadying assistance (Pt > 75%) Turns around,maneuvers to table,bed, and toilet,negotiates 3% grade,maneuvers on rugs and over doorsills: No Function - Locomotion: Ambulation Ambulation activity did not occur: Safety/medical concerns Assistive device: Walker-rolling Max distance: 170ft  Assist level: Supervision or verbal cues Walk 10 feet activity did not occur: Safety/medical concerns Assist level: Supervision or verbal cues Walk 50 feet with 2 turns activity did not occur: Safety/medical concerns Assist level: Supervision or verbal cues Walk 150 feet activity did not occur: Safety/medical concerns Assist level: Supervision or verbal cues Walk 10 feet on uneven surfaces activity did not occur: Safety/medical concerns  Function - Comprehension Comprehension: Auditory Comprehension assist level: Understands  basic 50 - 74% of the time/ requires cueing 25 - 49% of the time  Function - Expression Expression: Verbal Expression assistive device: Talk trach valve Expression assist level: Expresses basic 75 - 89% of the time/requires cueing 10 - 24% of the time. Needs helper to occlude trach/needs to repeat words.  Function - Social Interaction Social Interaction assist level: Interacts appropriately 75 - 89% of the time - Needs  redirection for appropriate language or to initiate interaction.  Function - Problem Solving Problem solving assist level: Solves basic 25 - 49% of the time - needs direction more than half the time to initiate, plan or complete simple activities  Function - Memory Memory assist level: Recognizes or recalls 50 - 74% of the time/requires cueing 25 - 49% of the time Patient normally able to recall (first 3 days only): Current season, That he or she is in a hospital, Staff names and faces  Medical Problem List and Plan: 1. 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 DVT Prophylaxis/Anticoagulation: Pharmaceutical: Other (comment)-- heparin, warfarin, or DOAC cause hematuria and hemoptysis  continue baby aspirin, 3. Pain Management: tylenol prn pain. Question muscle spasms--will monitor 4. Mood: LCSW to follow for evaluation as mentation improves.  5. Neuropsych: This patient is notcapable of making decisions on hisown behalf. 6. Skin/Wound Care: routine pressure relief measures.  7. Fluids/Electrolytes/Nutrition: Monitor I/O and adjust fluid boluses as needed. Continue tube feeds.  BMP Latest Ref Rng & Units 08/06/2016 07/30/2016 07/23/2016  Glucose 65 - 99 mg/dL 85 130(Q) 657(Q)  BUN 6 - 20 mg/dL 19 14 46(N)  Creatinine 0.61 - 1.24 mg/dL 6.29 5.28 4.13  Sodium 135 - 145 mmol/L 139 138 136  Potassium 3.5 - 5.1 mmol/L 4.2 3.8 4.2  Chloride 101 - 111 mmol/L 99(L) 99(L) 99(L)  CO2 22 - 32 mmol/L 30 32 33(H)  Calcium 8.9 - 10.3 mg/dL 9.0 9.1 8.9  . 8. Tracheostomy d/ced per CCM    .9. Parkinson's disease: Continue Sinemet 37.5/150 qid.   11. ABLA: Continue to monitor-no acute drops, Stool guaic neg, microscopic hematuria CBC Latest Ref Rng & Units 08/06/2016 08/05/2016 08/04/2016  WBC 4.0 - 10.5 K/uL 5.7 7.5 6.2  Hemoglobin 13.0 - 17.0 g/dL 2.4(M) 10.9(L) 9.7(L)  Hematocrit 39.0 - 52.0 % 31.9(L) 35.9(L) 31.8(L)   Platelets 150 - 400 K/uL 265 358 300     12. Hematuria: resolved , Hemoglobin steady-           13. Severe dysphagia: NPO till mentation improv  tolerating PEG feedings, Some therapeutic feeds with speech therapy, pt now decannulated 13.  UCx + staph epidermidis, likely skin contaminent, afebrile  UA with   neg nitrite, rare bacteria, no abx warranted at this time, will need to improve hydration, free water increased, voiding trial next week LOS (Days) 22 A FACE TO FACE EVALUATION WAS PERFORMED  Esha Fincher E 08/10/2016, 6:47 AM

## 2016-08-10 NOTE — Progress Notes (Signed)
Physical Therapy Weekly Progress Note  Patient Details  Name: Gregory [name] MRN: [id number] Date of Birth: 01/22/1941  Beginning of progress report period: August 03, 2016 End of progress report period: August 10, 2016  Today's Date: 08/10/2016 PT Individual Time: 1400-1500 PT Individual Time Calculation (min): 60 min   Patient has met 5 of 5 short term goals.  Pt has progressed to intermittent min-supervision assist from PT for gait up to 257f with RW as well as transfers and WC mobility. Min assist required for bed mobility, but patient continues to make steady progress toward all goals.   Patient continues to demonstrate the following deficits muscle weakness and muscle joint tightness, decreased cardiorespiratoy endurance, impaired timing and sequencing, unbalanced muscle activation, motor apraxia, decreased coordination and decreased motor planning, decreased attention to right, decreased motor planning and ideational apraxia, decreased initiation, decreased attention, decreased awareness, decreased problem solving, decreased safety awareness, decreased memory and delayed processing and decreased standing balance, decreased postural control, hemiplegia and decreased balance strategies and therefore will continue to benefit from skilled PT intervention to increase functional independence with mobility.  Patient progressing toward long term goals..  Continue plan of care.  PT Short Term Goals Week 3:  PT Short Term Goal 1 (Week 3): Patient will consistently perform sit<>stand with min assist  PT Short Term Goal 1 - Progress (Week 3): Met PT Short Term Goal 2 (Week 3): Patient will perform stand pivot transfer with min assist using LRAD  PT Short Term Goal 2 - Progress (Week 3): Met PT Short Term Goal 3 (Week 3): Patient will ambulate 1073fwith min assist PT Short Term Goal 3 - Progress (Week 3): Met PT Short Term Goal 4 (Week 3): Pt will mainatin stantic standing balance with 1 UE  supported and supervision assist up to 3 minutes.  PT Short Term Goal 4 - Progress (Week 3): Met PT Short Term Goal 5 (Week 3): Patient will ascesnd and desence 4, steps ( 6") with min assist  PT Short Term Goal 5 - Progress (Week 3): Met Week 4:  PT Short Term Goal 1 (Week 4): STG =LTG due to ELOS   Skilled Therapeutic Interventions/Progress Updates:   Pt received sitting in WC and agreeable to PT  Gait training with RW x [id number] [id number] superivsion assist and min assist with sharp turns. Pts family educated pt in safe gaurding to ambulate pt in room with min assist. Pt provided cues for proper cues and safety from family with good retention. Gait training also performed up/down ramp with min assist from PT  Standing balance on blue wedge to play 3 rounds of connect 4. Min assist progressing to supervision assist and intermittent 1-0 UE support. Pt able to demonstrate cognitive awareness and problem solving to appropriately play game.   WC mobility x 50ft 45f supervision assist and all four extremities for propulsion.  Patient returned to room and left sitting in recliner following stand pivot transfer with min assist from PT to prevent posterior LOB. Call bell in reach and all needs met.         Therapy Documentation Precautions:  Precautions Precautions: Fall Precaution Comments: B wrist restraints Restrictions Weight Bearing Restrictions: No General:   Vital Signs: Therapy Vitals Temp: 97.9 F (36.6 C) Temp Source: Oral Pulse Rate: 70 Resp: 18 BP: (!) 110/91 Patient Position (if appropriate): Sitting Oxygen Therapy SpO2: 100 % O2 Device: Not Delivered   See Function Navigator for Current Functional Status.  Therapy/Group:  Individual Therapy  [name] 08/10/2016, 3:55 PM

## 2016-08-11 ENCOUNTER — Inpatient Hospital Stay (HOSPITAL_COMMUNITY): Payer: Medicare Other | Admitting: Occupational Therapy

## 2016-08-11 ENCOUNTER — Inpatient Hospital Stay (HOSPITAL_COMMUNITY): Payer: Medicare Other | Admitting: Physical Therapy

## 2016-08-11 LAB — GLUCOSE, CAPILLARY
GLUCOSE-CAPILLARY: 88 mg/dL (ref 65–99)
GLUCOSE-CAPILLARY: 79 mg/dL (ref 65–99)
Glucose-Capillary: 121 mg/dL — ABNORMAL HIGH (ref 65–99)
Glucose-Capillary: 82 mg/dL (ref 65–99)

## 2016-08-11 NOTE — Progress Notes (Signed)
Occupational Therapy Session Note  Patient Details  Name: Gregory LoronRichard Angelos MRN: [id number] Date of Birth: 07-03-1940  Today's Date: 08/11/2016 OT Individual Time: 1330-1416 OT Individual Time Calculation (min): 46 min    Skilled Therapeutic Interventions/Progress Updates:    Pt completed functional mobility to the therapy gym with use of the RW and min assist.  Once in the gym had pt transition into sidelying and then supine to work on scapular mobilizations bilaterally and stretches.  Scapular mobs in sidelying with transition to supine for stretching of pectoral and completion of glenohumeral mobilizations with external rotation.  Discussed positioning in supine vs prone at home to help increase anterior stretch of shoulders, scaps and pectoral region with daughter.  Completed AAROM shoulder flexion with mobilizations as well through the range. Significantly more tightness and decreased AROM in the right shoulder for flexion, abduction, and external rotation compared to the left.  Pt transitioned to sitting with min assist for rolling to the right side and transitioning from sidelying to sit.  Pt left on EOM with daughter present and PT in for next session.     Therapy Documentation Precautions:  Precautions Precautions: Fall Precaution Comments: B wrist restraints Restrictions Weight Bearing Restrictions: No  Pain: Pain Assessment Pain Assessment: No/denies pain ADL: See Function Navigator for Current Functional Status.   Therapy/Group: Individual Therapy  Shalece Staffa OTR/L 08/11/2016, 3:31 PM

## 2016-08-11 NOTE — Progress Notes (Signed)
Physical Therapy Session Note  Patient Details  Name: Emelia LoronRichard Nobles MRN: [id number] Date of Birth: 1940/07/08  Today's Date: 08/11/2016 PT Individual Time: [id number] PT Individual Time Calculation (min): 29 min    Skilled Therapeutic Interventions/Progress Updates:    Session initiated with pt in PT gym.  Treatment focused on improving step length with gait.  Attempted to utilize external cue of orange theraband on walker to visually remind pt to take big steps to decrease shuffling of gait.  When pt slows gait and focuses on just verbal command "Big Step" pt able to navigate through doorways without freezing.  Pt has significant difficulty and increased shuffling when transitioning around corners.  Following session, pt left in room, up in recliner, with lap belt donned, and call bell/phone in reach.  Therapy Documentation Precautions:  Precautions Precautions: Fall Precaution Comments: B wrist restraints Restrictions Weight Bearing Restrictions: No   See Function Navigator for Current Functional Status.   Therapy/Group: Individual Therapy  Channel Papandrea Elveria RisingM Gypsy Kellogg 08/11/2016, 4:30 PM

## 2016-08-11 NOTE — Progress Notes (Signed)
Subjective/Complaints:  No new issues.   ROS: Limited due cognitive/behavioral  Objective: Vital Signs: Blood pressure 112/80, pulse 69, temperature 98 F (36.7 C), temperature source Oral, resp. rate 16, height 5\' 9"  (1.753 m), weight 79 kg (174 lb 2.6 oz), SpO2 100 %. No results found. Results for orders placed or performed during the hospital encounter of 07/19/16 (from the past 72 hour(s))  Glucose, capillary     Status: None   Collection Time: 08/08/16 12:01 PM  Result Value Ref Range   Glucose-Capillary 87 65 - 99 mg/dL  Glucose, capillary     Status: None   Collection Time: 08/08/16  4:56 PM  Result Value Ref Range   Glucose-Capillary 95 65 - 99 mg/dL  Glucose, capillary     Status: Abnormal   Collection Time: 08/08/16  9:40 PM  Result Value Ref Range   Glucose-Capillary 102 (H) 65 - 99 mg/dL  Glucose, capillary     Status: None   Collection Time: 08/09/16  6:52 AM  Result Value Ref Range   Glucose-Capillary 77 65 - 99 mg/dL  Glucose, capillary     Status: None   Collection Time: 08/09/16  1:10 PM  Result Value Ref Range   Glucose-Capillary 83 65 - 99 mg/dL  Glucose, capillary     Status: Abnormal   Collection Time: 08/09/16  4:20 PM  Result Value Ref Range   Glucose-Capillary 100 (H) 65 - 99 mg/dL   Comment 1 Notify RN   Glucose, capillary     Status: Abnormal   Collection Time: 08/09/16  9:25 PM  Result Value Ref Range   Glucose-Capillary 120 (H) 65 - 99 mg/dL  Glucose, capillary     Status: None   Collection Time: 08/10/16  6:15 AM  Result Value Ref Range   Glucose-Capillary 86 65 - 99 mg/dL  Glucose, capillary     Status: Abnormal   Collection Time: 08/10/16 11:22 AM  Result Value Ref Range   Glucose-Capillary 115 (H) 65 - 99 mg/dL  Glucose, capillary     Status: None   Collection Time: 08/10/16  4:45 PM  Result Value Ref Range   Glucose-Capillary 94 65 - 99 mg/dL  Glucose, capillary     Status: Abnormal   Collection Time: 08/10/16 10:04 PM  Result  Value Ref Range   Glucose-Capillary 119 (H) 65 - 99 mg/dL   Comment 1 Notify RN   Glucose, capillary     Status: None   Collection Time: 08/11/16  6:53 AM  Result Value Ref Range   Glucose-Capillary 88 65 - 99 mg/dL     HEENT: Normocephalic. Healing trauma Neck: #6 shiley, significant secretions, white thick, no bloody streaks Cardio: RRR.  Resp: normal effort. occ rhonchi GI: BS positive and ND, PEG site clean, dry and intact. Nontender to palpation. No drainage  Skin:    , Motor: 4/5 in bue and BLE Musc/Skel:  No edema. Mild R rib tenderness.  Gen NAD, Vital signs reviewed.  GU Foley bag .golden colored urine  Assessment/Plan: 1. Functional deficits secondary to bilateral cortical and subcortical cardioembolic infarcts which require 3+ hours per day of interdisciplinary therapy in a comprehensive inpatient rehab setting. Physiatrist is providing close team supervision and 24 hour management of active medical problems listed below. Physiatrist and rehab team continue to assess barriers to discharge/monitor patient progress toward functional and medical goals. FIM: Function - Bathing Bathing activity did not occur: Refused Position: Shower Body parts bathed by patient: Right arm, Left arm,  Chest, Abdomen, Front perineal area, Buttocks, Right upper leg, Left upper leg Body parts bathed by helper: Right lower leg, Left lower leg, Back Bathing not applicable: Front perineal area, Buttocks Assist Level: Touching or steadying assistance(Pt > 75%)  Function- Upper Body Dressing/Undressing What is the patient wearing?: Pull over shirt/dress Pull over shirt/dress - Perfomed by patient: Thread/unthread right sleeve, Thread/unthread left sleeve, Put head through opening Pull over shirt/dress - Perfomed by helper: Pull shirt over trunk Assist Level: Supervision or verbal cues Function - Lower Body Dressing/Undressing What is the patient wearing?: Pants, Socks, Shoes Position:  Wheelchair/chair at [employer] - Performed by patient: Thread/unthread left underwear leg Pants- Performed by patient: Thread/unthread left pants leg, Thread/unthread right pants leg, Pull pants up/down Pants- Performed by helper: Pull pants up/down Non-skid slipper socks- Performed by patient: Don/doff left sock Non-skid slipper socks- Performed by helper: Don/doff right sock Socks - Performed by patient: Don/doff right sock Socks - Performed by helper: Don/doff left sock Shoes - Performed by patient: Don/doff right shoe, Don/doff left shoe, Fasten right Shoes - Performed by helper: Don/doff right shoe, Fasten left Assist for footwear: Maximal assist Assist for lower body dressing: Touching or steadying assistance (Pt > 75%)  Function - Toileting Toileting activity did not occur: No continent bowel/bladder event Toileting steps completed by patient: Adjust clothing after toileting Toileting steps completed by helper: Adjust clothing prior to toileting, Performs perineal hygiene, Adjust clothing after toileting Toileting Assistive Devices: Grab bar or rail Assist level: Touching or steadying assistance (Pt.75%)  Function - Archivist transfer activity did not occur:  (no bm. foley in place) Toilet transfer assistive device: Elevated toilet seat/BSC over toilet Mechanical lift: Stedy Assist level to toilet: Touching or steadying assistance (Pt > 75%) Assist level from toilet: Touching or steadying assistance (Pt > 75%) Assist level to bedside commode (at bedside): 2 helpers Assist level from bedside commode (at bedside): 2 helpers  Function - Chair/bed transfer Chair/bed transfer method: Stand pivot, Ambulatory Chair/bed transfer assist level: Touching or steadying assistance (Pt > 75%) Chair/bed transfer assistive device: Armrests, Biochemist, clinical lift: Stedy Chair/bed transfer details: Verbal cues for technique, Tactile cues for posture, Verbal cues for  sequencing, Manual facilitation for weight shifting, Manual facilitation for placement  Function - Locomotion: Wheelchair Will patient use wheelchair at discharge?: Yes Type: Manual Wheelchair activity did not occur: Safety/medical concerns Max wheelchair distance: 168ft  Assist Level: Supervision or verbal cues Wheel 50 feet with 2 turns activity did not occur: Safety/medical concerns Assist Level: Supervision or verbal cues Wheel 150 feet activity did not occur: Safety/medical concerns Assist Level: Touching or steadying assistance (Pt > 75%) Turns around,maneuvers to table,bed, and toilet,negotiates 3% grade,maneuvers on rugs and over doorsills: No Function - Locomotion: Ambulation Ambulation activity did not occur: Safety/medical concerns Assistive device: Walker-rolling Max distance: 152ft  Assist level: Supervision or verbal cues Walk 10 feet activity did not occur: Safety/medical concerns Assist level: Supervision or verbal cues Walk 50 feet with 2 turns activity did not occur: Safety/medical concerns Assist level: Supervision or verbal cues Walk 150 feet activity did not occur: Safety/medical concerns Assist level: Supervision or verbal cues Walk 10 feet on uneven surfaces activity did not occur: Safety/medical concerns  Function - Comprehension Comprehension: Auditory Comprehension assist level: Understands basic 50 - 74% of the time/ requires cueing 25 - 49% of the time  Function - Expression Expression: Verbal Expression assistive device: Talk trach valve Expression assist level: Expresses basic 75 - 89% of the  time/requires cueing 10 - 24% of the time. Needs helper to occlude trach/needs to repeat words.  Function - Social Interaction Social Interaction assist level: Interacts appropriately 75 - 89% of the time - Needs redirection for appropriate language or to initiate interaction.  Function - Problem Solving Problem solving assist level: Solves basic 25 - 49% of  the time - needs direction more than half the time to initiate, plan or complete simple activities  Function - Memory Memory assist level: Recognizes or recalls 25 - 49% of the time/requires cueing 50 - 75% of the time Patient normally able to recall (first 3 days only): Current season, Location of own room, Staff names and faces, That he or she is in a hospital  Medical Problem List and Plan: 1. 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 DVT Prophylaxis/Anticoagulation: Pharmaceutical: Other (comment)-- heparin, warfarin, or DOAC cause hematuria and hemoptysis  continue baby aspirin only,  3. Pain Management: tylenol prn pain. Question muscle spasms--will monitor 4. Mood: LCSW to follow for evaluation as mentation improves.  5. Neuropsych: This patient is notcapable of making decisions on hisown behalf. 6. Skin/Wound Care: routine pressure relief measures.  7. Fluids/Electrolytes/Nutrition: Monitor I/O and adjust fluid boluses as needed. Continue tube feeds.  BMP Latest Ref Rng & Units 08/06/2016 07/30/2016 07/23/2016  Glucose 65 - 99 mg/dL 85 440(N101(H) 027(O128(H)  BUN 6 - 20 mg/dL 19 14 53(G21(H)  Creatinine 0.61 - 1.24 mg/dL 6.440.92 0.340.75 7.420.76  Sodium 135 - 145 mmol/L 139 138 136  Potassium 3.5 - 5.1 mmol/L 4.2 3.8 4.2  Chloride 101 - 111 mmol/L 99(L) 99(L) 99(L)  CO2 22 - 32 mmol/L 30 32 33(H)  Calcium 8.9 - 10.3 mg/dL 9.0 9.1 8.9  . 8. Tracheostomy d/ced per CCM     9. Parkinson's disease: Continue Sinemet 37.5/150 qid.   11. ABLA: Continue to monitor-no acute drops, Stool guaic neg, microscopic hematuria CBC Latest Ref Rng & Units 08/06/2016 08/05/2016 08/04/2016  WBC 4.0 - 10.5 K/uL 5.7 7.5 6.2  Hemoglobin 13.0 - 17.0 g/dL 5.9(D9.8(L) 10.9(L) 9.7(L)  Hematocrit 39.0 - 52.0 % 31.9(L) 35.9(L) 31.8(L)  Platelets 150 - 400 K/uL 265 358 300     12. Hematuria: resolved , Hemoglobin steady-           13. Severe  dysphagia: NPO till mentation improv  tolerating PEG feedings, Some therapeutic feeds with speech therapy, pt now decannulated 13.  UCx + staph epidermidis, likely skin contaminent, afebrile  UA with   neg nitrite, rare bacteria, no abx warranted at this time, will need to improve hydration, free water increased, voiding trial next week   LOS (Days) 23 A FACE TO FACE EVALUATION WAS PERFORMED  Alejandra Barna T 08/11/2016, 8:50 AM

## 2016-08-12 ENCOUNTER — Inpatient Hospital Stay (HOSPITAL_COMMUNITY): Payer: Medicare Other | Admitting: Occupational Therapy

## 2016-08-12 LAB — GLUCOSE, CAPILLARY
GLUCOSE-CAPILLARY: 98 mg/dL (ref 65–99)
GLUCOSE-CAPILLARY: 77 mg/dL (ref 65–99)
Glucose-Capillary: 78 mg/dL (ref 65–99)

## 2016-08-12 NOTE — Progress Notes (Signed)
Subjective/Complaints:  Reasonable night per RN. No new issues this morning.   ROS: Limited due cognitive/behavioral   Objective: Vital Signs: Blood pressure 100/82, pulse 84, temperature 98 F (36.7 C), temperature source Oral, resp. rate 18, height 5\' 9"  (1.753 m), weight 83.7 kg (184 lb 8 oz), SpO2 98 %. No results found. Results for orders placed or performed during the hospital encounter of 07/19/16 (from the past 72 hour(s))  Glucose, capillary     Status: None   Collection Time: 08/09/16  1:10 PM  Result Value Ref Range   Glucose-Capillary 83 65 - 99 mg/dL  Glucose, capillary     Status: Abnormal   Collection Time: 08/09/16  4:20 PM  Result Value Ref Range   Glucose-Capillary 100 (H) 65 - 99 mg/dL   Comment 1 Notify RN   Glucose, capillary     Status: Abnormal   Collection Time: 08/09/16  9:25 PM  Result Value Ref Range   Glucose-Capillary 120 (H) 65 - 99 mg/dL  Glucose, capillary     Status: None   Collection Time: 08/10/16  6:15 AM  Result Value Ref Range   Glucose-Capillary 86 65 - 99 mg/dL  Glucose, capillary     Status: Abnormal   Collection Time: 08/10/16 11:22 AM  Result Value Ref Range   Glucose-Capillary 115 (H) 65 - 99 mg/dL  Glucose, capillary     Status: None   Collection Time: 08/10/16  4:45 PM  Result Value Ref Range   Glucose-Capillary 94 65 - 99 mg/dL  Glucose, capillary     Status: Abnormal   Collection Time: 08/10/16 10:04 PM  Result Value Ref Range   Glucose-Capillary 119 (H) 65 - 99 mg/dL   Comment 1 Notify RN   Glucose, capillary     Status: None   Collection Time: 08/11/16  6:53 AM  Result Value Ref Range   Glucose-Capillary 88 65 - 99 mg/dL  Glucose, capillary     Status: Abnormal   Collection Time: 08/11/16 11:49 AM  Result Value Ref Range   Glucose-Capillary 121 (H) 65 - 99 mg/dL  Glucose, capillary     Status: None   Collection Time: 08/11/16  4:18 PM  Result Value Ref Range   Glucose-Capillary 82 65 - 99 mg/dL  Glucose,  capillary     Status: None   Collection Time: 08/11/16  9:27 PM  Result Value Ref Range   Glucose-Capillary 79 65 - 99 mg/dL  Glucose, capillary     Status: None   Collection Time: 08/12/16  7:02 AM  Result Value Ref Range   Glucose-Capillary 98 65 - 99 mg/dL   Comment 1 Notify RN      HEENT: Normocephalic. Healing trauma Neck: #6 shiley, decreased secretions Cardio: RRR.  Resp: normal effort. occ rhonchi GI: BS positive and ND, PEG site clean, dry and intact. Nontender to palpation. No drainage  Skin:    , Motor: 4/5 in bue and BLE Musc/Skel:  No edema. Mild R rib tenderness.  Gen NAD, Vital signs reviewed.  GU Foley bag .golden colored urine  Assessment/Plan: 1. Functional deficits secondary to bilateral cortical and subcortical cardioembolic infarcts which require 3+ hours per day of interdisciplinary therapy in a comprehensive inpatient rehab setting. Physiatrist is providing close team supervision and 24 hour management of active medical problems listed below. Physiatrist and rehab team continue to assess barriers to discharge/monitor patient progress toward functional and medical goals. FIM: Function - Bathing Bathing activity did not occur: Refused Position: Shower  Body parts bathed by patient: Right arm, Left arm, Chest, Abdomen, Front perineal area, Buttocks, Right upper leg, Left upper leg Body parts bathed by helper: Right lower leg, Left lower leg, Back Bathing not applicable: Front perineal area, Buttocks Assist Level: Touching or steadying assistance(Pt > 75%)  Function- Upper Body Dressing/Undressing What is the patient wearing?: Pull over shirt/dress Pull over shirt/dress - Perfomed by patient: Thread/unthread right sleeve, Thread/unthread left sleeve, Put head through opening Pull over shirt/dress - Perfomed by helper: Pull shirt over trunk Assist Level: Supervision or verbal cues Function - Lower Body Dressing/Undressing What is the patient wearing?: Pants,  Socks, Shoes Position: Wheelchair/chair at [employer] - Performed by patient: Thread/unthread left underwear leg Pants- Performed by patient: Thread/unthread left pants leg, Thread/unthread right pants leg, Pull pants up/down Pants- Performed by helper: Pull pants up/down Non-skid slipper socks- Performed by patient: Don/doff left sock Non-skid slipper socks- Performed by helper: Don/doff right sock Socks - Performed by patient: Don/doff right sock Socks - Performed by helper: Don/doff left sock Shoes - Performed by patient: Don/doff right shoe, Don/doff left shoe, Fasten right Shoes - Performed by helper: Don/doff right shoe, Fasten left Assist for footwear: Maximal assist Assist for lower body dressing: Touching or steadying assistance (Pt > 75%)  Function - Toileting Toileting activity did not occur: No continent bowel/bladder event Toileting steps completed by patient: Adjust clothing after toileting Toileting steps completed by helper: Adjust clothing prior to toileting, Performs perineal hygiene, Adjust clothing after toileting Toileting Assistive Devices: Grab bar or rail Assist level: Touching or steadying assistance (Pt.75%)  Function - Archivist transfer activity did not occur:  (no bm. foley in place) Toilet transfer assistive device: Elevated toilet seat/BSC over toilet Mechanical lift: Stedy Assist level to toilet: Touching or steadying assistance (Pt > 75%) Assist level from toilet: Touching or steadying assistance (Pt > 75%) Assist level to bedside commode (at bedside): 2 helpers Assist level from bedside commode (at bedside): 2 helpers  Function - Chair/bed transfer Chair/bed transfer method: Stand pivot, Ambulatory Chair/bed transfer assist level: Touching or steadying assistance (Pt > 75%) Chair/bed transfer assistive device: Armrests, Biochemist, clinical lift: Stedy Chair/bed transfer details: Verbal cues for technique, Tactile cues for posture,  Verbal cues for sequencing, Manual facilitation for weight shifting, Manual facilitation for placement  Function - Locomotion: Wheelchair Will patient use wheelchair at discharge?: Yes Type: Manual Wheelchair activity did not occur: Safety/medical concerns Max wheelchair distance: 172ft  Assist Level: Supervision or verbal cues Wheel 50 feet with 2 turns activity did not occur: Safety/medical concerns Assist Level: Supervision or verbal cues Wheel 150 feet activity did not occur: Safety/medical concerns Assist Level: Touching or steadying assistance (Pt > 75%) Turns around,maneuvers to table,bed, and toilet,negotiates 3% grade,maneuvers on rugs and over doorsills: No Function - Locomotion: Ambulation Ambulation activity did not occur: Safety/medical concerns Assistive device: Walker-rolling Max distance:  (150 ft) Assist level: Touching or steadying assistance (Pt > 75%) Walk 10 feet activity did not occur: Safety/medical concerns Assist level: Touching or steadying assistance (Pt > 75%) Walk 50 feet with 2 turns activity did not occur: Safety/medical concerns Assist level: Touching or steadying assistance (Pt > 75%) Walk 150 feet activity did not occur: Safety/medical concerns Assist level: Touching or steadying assistance (Pt > 75%) Walk 10 feet on uneven surfaces activity did not occur: Safety/medical concerns  Function - Comprehension Comprehension: Auditory Comprehension assist level: Understands basic 75 - 89% of the time/ requires cueing 10 - 24% of the time  Function - Expression Expression: Verbal Expression assistive device: Talk trach valve Expression assist level: Expresses basic 75 - 89% of the time/requires cueing 10 - 24% of the time. Needs helper to occlude trach/needs to repeat words.  Function - Social Interaction Social Interaction assist level: Interacts appropriately 75 - 89% of the time - Needs redirection for appropriate language or to initiate  interaction.  Function - Problem Solving Problem solving assist level: Solves basic 25 - 49% of the time - needs direction more than half the time to initiate, plan or complete simple activities  Function - Memory Memory assist level: Recognizes or recalls 25 - 49% of the time/requires cueing 50 - 75% of the time Patient normally able to recall (first 3 days only): Staff names and faces, That he or she is in a hospital  Medical Problem List and Plan: 1. 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 DVT Prophylaxis/Anticoagulation: Pharmaceutical: Other (comment)-- heparin, warfarin, or DOAC cause hematuria and hemoptysis  continue baby aspirin only,  3. Pain Management: tylenol prn pain. Question muscle spasms--will monitor 4. Mood: LCSW to follow for evaluation as mentation improves.  5. Neuropsych: This patient is notcapable of making decisions on hisown behalf. 6. Skin/Wound Care: routine pressure relief measures.  7. Fluids/Electrolytes/Nutrition: Monitor I/O and adjust fluid boluses as needed. Continue tube feeds.  BMP Latest Ref Rng & Units 08/06/2016 07/30/2016 07/23/2016  Glucose 65 - 99 mg/dL 85 409(W101(H) 119(J128(H)  BUN 6 - 20 mg/dL 19 14 47(W21(H)  Creatinine 0.61 - 1.24 mg/dL 2.950.92 6.210.75 3.080.76  Sodium 135 - 145 mmol/L 139 138 136  Potassium 3.5 - 5.1 mmol/L 4.2 3.8 4.2  Chloride 101 - 111 mmol/L 99(L) 99(L) 99(L)  CO2 22 - 32 mmol/L 30 32 33(H)  Calcium 8.9 - 10.3 mg/dL 9.0 9.1 8.9  . 8. Tracheostomy d/ced per CCM     9. Parkinson's disease: Continue Sinemet 37.5/150 qid.   11. ABLA: Continue to monitor-no acute drops, Stool guaic neg, microscopic hematuria CBC Latest Ref Rng & Units 08/06/2016 08/05/2016 08/04/2016  WBC 4.0 - 10.5 K/uL 5.7 7.5 6.2  Hemoglobin 13.0 - 17.0 g/dL 6.5(H9.8(L) 10.9(L) 9.7(L)  Hematocrit 39.0 - 52.0 % 31.9(L) 35.9(L) 31.8(L)  Platelets 150 - 400 K/uL 265 358 300     12.  Hematuria: resolved , Hemoglobin steady-           13. Severe dysphagia: NPO till mentation improv  tolerating PEG feedings, Some therapeutic feeds with speech therapy, pt now decannulated 13.  UCx + staph epidermidis, likely skin contaminent, afebrile  UA with   neg nitrite, rare bacteria, no abx warranted at this time, will need to improve hydration, free water increased, voiding trial this week   LOS (Days) 24 A FACE TO FACE EVALUATION WAS PERFORMED  SWARTZ,ZACHARY T 08/12/2016, 9:37 AM

## 2016-08-12 NOTE — Progress Notes (Signed)
08/12/16 1311 nursing Pt. tolerated with no coughing episodes when family member gave nectar juice per teaspoon to patient. Pt had 5 teaspoonful.

## 2016-08-12 NOTE — Progress Notes (Signed)
Occupational Therapy Session Note  Patient Details  Name: Gregory [name] MRN: [id number] Date of Birth: 1941/01/19  Today's Date: 08/12/2016 OT Individual Time: [id number] OT Individual Time Calculation (min): 60 min    Skilled Therapeutic Interventions/Progress Updates: Skilled OT session completed with focus on anterior weight shift, dynamic balance  and cognitive skills. Pt lying in bed with son [name] present at time of arrival, agreeable to session. Had him don shoes at EOB with manual facilitation for anterior weight shifting due to posterior LOBs when crossing LEs. He required balance support from therapist to don each shoe. Afterwards he ambulated with Min A and RW to dayroom, cued for taking big steps. Once in dayroom, had him complete puzzle activity while standing on blue foam for balance and anterior weight shifting (per demand of activity). Pt able to stand for 21 minutes with min guard and unilateral support on RW. Pt required max cues for organization and problem solving, mod cues for sustained attention. In 20 minutes he was able to correctly place 9 pieces together out of 24 piece puzzle. At end of session pt self propelled back to room with son, who was providing pt with appropriate cuing to maximize pts independence with task.   Therapy Documentation Precautions:  Precautions Precautions: Fall Precaution Comments: B wrist restraints Restrictions Weight Bearing Restrictions: No  Pain: No c/o pain during session    ADL:      See Function Navigator for Current Functional Status.   Therapy/Group: Individual Therapy  [name] A Neill Jurewicz 08/12/2016, 12:22 PM

## 2016-08-12 NOTE — Procedures (Signed)
Patient refused CPAP.

## 2016-08-13 ENCOUNTER — Inpatient Hospital Stay (HOSPITAL_COMMUNITY): Payer: Medicare Other | Admitting: Occupational Therapy

## 2016-08-13 ENCOUNTER — Inpatient Hospital Stay (HOSPITAL_COMMUNITY): Payer: Medicare Other | Admitting: Physical Therapy

## 2016-08-13 ENCOUNTER — Inpatient Hospital Stay (HOSPITAL_COMMUNITY): Payer: Medicare Other | Admitting: Speech Pathology

## 2016-08-13 DIAGNOSIS — I639 Cerebral infarction, unspecified: Secondary | ICD-10-CM

## 2016-08-13 LAB — BASIC METABOLIC PANEL
Anion gap: 10 (ref 5–15)
BUN: 23 mg/dL — AB (ref 6–20)
CO2: 29 mmol/L (ref 22–32)
CREATININE: 0.75 mg/dL (ref 0.61–1.24)
Calcium: 9 mg/dL (ref 8.9–10.3)
Chloride: 101 mmol/L (ref 101–111)
GFR calc Af Amer: >60 mL/min (ref >60)
Glucose, Bld: 93 mg/dL (ref 65–99)
Potassium: 4.1 mmol/L (ref 3.5–5.1)
SODIUM: 140 mmol/L (ref 135–145)

## 2016-08-13 LAB — GLUCOSE, CAPILLARY
GLUCOSE-CAPILLARY: 91 mg/dL (ref 65–99)
Glucose-Capillary: 117 mg/dL — ABNORMAL HIGH (ref 65–99)
Glucose-Capillary: 93 mg/dL (ref 65–99)
Glucose-Capillary: 93 mg/dL (ref 65–99)

## 2016-08-13 MED ORDER — FREE WATER
250.0000 mL | Freq: Three times a day (TID) | Status: DC
  Administered 2016-08-13 – 2016-08-16 (×11): 250 mL

## 2016-08-13 MED ORDER — LIDOCAINE HCL 2 % EX GEL: CUTANEOUS | Status: DC | PRN

## 2016-08-13 NOTE — Progress Notes (Signed)
Occupational Therapy Session Note  Patient Details  Name: Gregory LoronRichard Bales MRN: [id number] Date of Birth: March 18, 1941  Today's Date: 08/13/2016 OT Individual Time: 0900-1015 OT Individual Time Calculation (min): 75 min    Short Term Goals: Week 4:  OT Short Term Goal 1 (Week 4): Continue working on established min assist to supervision level goals for discharge.   Skilled Therapeutic Interventions/Progress Updates:    Pt seen for OT ADL bathing/dressing session. Pt asleep in supine upon arrival, easily awoken and agreeable to tx session. He denied pain, though voiced some discomfort in buttock after prolonged sitting, but ootherwise agreeable to tx session.  He transferred to EOB using hospital bed functions with VCs for technique and assist to advance hips with use of chuck pad. He ambulated throughout session with min A and VCs for elongated stride length. He bathed seated on tub bench, able to cross ankle over knee with set-up assist to wash feet, assist required for buttock hygiene. He dressed seated in w/c with increased time for clothing management and min A to pull pants up. He ambulated throughout unit to therapy day room with min A, VCs for attention to task and increased step length. Pt able to verbalize need for increased step length, however, does not follow through. Completed sit <> stand from low soft surface couch with mod A and VCs for technique, pt very quick to ask for +2 assist from son, however, able to stand with assist from one. He returned to room at end of session, left seated in w/c with all needs in reach and family present. Pt's family arrived towards end of session, reviewed therapy goals, discussed showering needs as pt's shower on second floor, recommended pt's family speak with primary PT regarding steps as this is this therapist's first time working with this pt.   Therapy Documentation Precautions:  Precautions Precautions: Fall Precaution Comments: B wrist  restraints Restrictions Weight Bearing Restrictions: No  See Function Navigator for Current Functional Status.   Therapy/Group: Individual Therapy  Lewis, Amethyst Gainer C 08/13/2016, 6:42 AM

## 2016-08-13 NOTE — Progress Notes (Signed)
Subjective/Complaints:  No issues overnite, pt trying to remember my name, recognizes me as his MD  ROS: Limited due cognitive/behavioral   Objective: Vital Signs: Blood pressure 126/64, pulse 77, temperature 97.9 F (36.6 C), temperature source Oral, resp. rate 16, height 5' 9" (1.753 m), weight 85.5 kg (188 lb 9.6 oz), SpO2 100 %. No results found. Results for orders placed or performed during the hospital encounter of 07/19/16 (from the past 72 hour(s))  Glucose, capillary     Status: None   Collection Time: 08/10/16  6:15 AM  Result Value Ref Range   Glucose-Capillary 86 65 - 99 mg/dL  Glucose, capillary     Status: Abnormal   Collection Time: 08/10/16 11:22 AM  Result Value Ref Range   Glucose-Capillary 115 (H) 65 - 99 mg/dL  Glucose, capillary     Status: None   Collection Time: 08/10/16  4:45 PM  Result Value Ref Range   Glucose-Capillary 94 65 - 99 mg/dL  Glucose, capillary     Status: Abnormal   Collection Time: 08/10/16 10:04 PM  Result Value Ref Range   Glucose-Capillary 119 (H) 65 - 99 mg/dL   Comment 1 Notify RN   Glucose, capillary     Status: None   Collection Time: 08/11/16  6:53 AM  Result Value Ref Range   Glucose-Capillary 88 65 - 99 mg/dL  Glucose, capillary     Status: Abnormal   Collection Time: 08/11/16 11:49 AM  Result Value Ref Range   Glucose-Capillary 121 (H) 65 - 99 mg/dL  Glucose, capillary     Status: None   Collection Time: 08/11/16  4:18 PM  Result Value Ref Range   Glucose-Capillary 82 65 - 99 mg/dL  Glucose, capillary     Status: None   Collection Time: 08/11/16  9:27 PM  Result Value Ref Range   Glucose-Capillary 79 65 - 99 mg/dL  Glucose, capillary     Status: None   Collection Time: 08/12/16  7:02 AM  Result Value Ref Range   Glucose-Capillary 98 65 - 99 mg/dL   Comment 1 Notify RN   Glucose, capillary     Status: None   Collection Time: 08/12/16  5:19 PM  Result Value Ref Range   Glucose-Capillary 77 65 - 99 mg/dL   Glucose, capillary     Status: None   Collection Time: 08/12/16  9:57 PM  Result Value Ref Range   Glucose-Capillary 78 65 - 99 mg/dL  Basic metabolic panel     Status: Abnormal   Collection Time: 08/13/16  5:14 AM  Result Value Ref Range   Sodium 140 135 - 145 mmol/L   Potassium 4.1 3.5 - 5.1 mmol/L   Chloride 101 101 - 111 mmol/L   CO2 29 22 - 32 mmol/L   Glucose, Bld 93 65 - 99 mg/dL   BUN 23 (H) 6 - 20 mg/dL   Creatinine, Ser 0.75 0.61 - 1.24 mg/dL   Calcium 9.0 8.9 - 10.3 mg/dL   GFR calc non Af Amer >60 >60 mL/min   GFR calc Af Amer >60 >60 mL/min    Comment: (NOTE) The eGFR has been calculated using the CKD EPI equation. This calculation has not been validated in all clinical situations. eGFR's persistently <60 mL/min signify possible Chronic Kidney Disease.    Anion gap 10 5 - 15     HEENT: Normocephalic. Healing trauma Neck: #6 shiley, decreased secretions Cardio: RRR.  Resp: normal effort. occ rhonchi GI: BS positive and ND,   PEG site clean, dry and intact. Nontender to palpation. No drainage  Skin:    , Motor: 4/5 in bue and BLE Musc/Skel:  No edema. Mild R rib tenderness.  Gen NAD, Vital signs reviewed.  GU Foley bag .golden colored urine  Assessment/Plan: 1. Functional deficits secondary to bilateral cortical and subcortical cardioembolic infarcts which require 3+ hours per day of interdisciplinary therapy in a comprehensive inpatient rehab setting. Physiatrist is providing close team supervision and 24 hour management of active medical problems listed below. Physiatrist and rehab team continue to assess barriers to discharge/monitor patient progress toward functional and medical goals. FIM: Function - Bathing Bathing activity did not occur: Refused Position: Shower Body parts bathed by patient: Right arm, Left arm, Chest, Abdomen, Front perineal area, Buttocks, Right upper leg, Left upper leg Body parts bathed by helper: Right lower leg, Left lower leg,  Back Bathing not applicable: Front perineal area, Buttocks Assist Level: Touching or steadying assistance(Pt > 75%)  Function- Upper Body Dressing/Undressing What is the patient wearing?: Pull over shirt/dress Pull over shirt/dress - Perfomed by patient: Thread/unthread right sleeve, Thread/unthread left sleeve, Put head through opening Pull over shirt/dress - Perfomed by helper: Pull shirt over trunk Assist Level: Supervision or verbal cues Function - Lower Body Dressing/Undressing What is the patient wearing?: Pants, Socks, Shoes Position: Wheelchair/chair at [employer] - Performed by patient: Thread/unthread left underwear leg Pants- Performed by patient: Thread/unthread left pants leg, Thread/unthread right pants leg, Pull pants up/down Pants- Performed by helper: Pull pants up/down Non-skid slipper socks- Performed by patient: Don/doff left sock Non-skid slipper socks- Performed by helper: Don/doff right sock Socks - Performed by patient: Don/doff right sock Socks - Performed by helper: Don/doff left sock Shoes - Performed by patient: Don/doff right shoe, Don/doff left shoe, Fasten right Shoes - Performed by helper: Don/doff right shoe, Fasten left Assist for footwear: Maximal assist Assist for lower body dressing: Touching or steadying assistance (Pt > 75%)  Function - Toileting Toileting activity did not occur: No continent bowel/bladder event Toileting steps completed by patient: Performs perineal hygiene Toileting steps completed by helper: Adjust clothing prior to toileting, Adjust clothing after toileting Toileting Assistive Devices: Grab bar or rail Assist level: Touching or steadying assistance (Pt.75%)  Function - Air cabin crew transfer activity did not occur:  (no bm. foley in place) Toilet transfer assistive device: Elevated toilet seat/BSC over toilet Mechanical lift: Stedy Assist level to toilet: Touching or steadying assistance (Pt > 75%) Assist  level from toilet: Touching or steadying assistance (Pt > 75%) Assist level to bedside commode (at bedside): 2 helpers Assist level from bedside commode (at bedside): 2 helpers  Function - Chair/bed transfer Chair/bed transfer method: Stand pivot, Ambulatory Chair/bed transfer assist level: Touching or steadying assistance (Pt > 75%) Chair/bed transfer assistive device: Armrests, Environmental manager lift: Stedy Chair/bed transfer details: Verbal cues for technique, Tactile cues for posture, Verbal cues for sequencing, Manual facilitation for weight shifting, Manual facilitation for placement  Function - Locomotion: Wheelchair Will patient use wheelchair at discharge?: Yes Type: Manual Wheelchair activity did not occur: Safety/medical concerns Max wheelchair distance: 143f  Assist Level: Supervision or verbal cues Wheel 50 feet with 2 turns activity did not occur: Safety/medical concerns Assist Level: Supervision or verbal cues Wheel 150 feet activity did not occur: Safety/medical concerns Assist Level: Touching or steadying assistance (Pt > 75%) Turns around,maneuvers to table,bed, and toilet,negotiates 3% grade,maneuvers on rugs and over doorsills: No Function - Locomotion: Ambulation Ambulation activity did not occur:  Safety/medical concerns Assistive device: Walker-rolling Max distance:  (150 ft) Assist level: Touching or steadying assistance (Pt > 75%) Walk 10 feet activity did not occur: Safety/medical concerns Assist level: Touching or steadying assistance (Pt > 75%) Walk 50 feet with 2 turns activity did not occur: Safety/medical concerns Assist level: Touching or steadying assistance (Pt > 75%) Walk 150 feet activity did not occur: Safety/medical concerns Assist level: Touching or steadying assistance (Pt > 75%) Walk 10 feet on uneven surfaces activity did not occur: Safety/medical concerns  Function - Comprehension Comprehension: Auditory Comprehension assist level:  Understands basic 75 - 89% of the time/ requires cueing 10 - 24% of the time  Function - Expression Expression: Verbal Expression assistive device: Talk trach valve Expression assist level: Expresses basic 75 - 89% of the time/requires cueing 10 - 24% of the time. Needs helper to occlude trach/needs to repeat words.  Function - Social Interaction Social Interaction assist level: Interacts appropriately 75 - 89% of the time - Needs redirection for appropriate language or to initiate interaction.  Function - Problem Solving Problem solving assist level: Solves basic 25 - 49% of the time - needs direction more than half the time to initiate, plan or complete simple activities  Function - Memory Memory assist level: Recognizes or recalls 25 - 49% of the time/requires cueing 50 - 75% of the time Patient normally able to recall (first 3 days only): Staff names and faces, That he or she is in a hospital  Medical Problem List and Plan: 1. 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 DVT Prophylaxis/Anticoagulation: Pharmaceutical: Other (comment)-- heparin, warfarin, or DOAC cause hematuria and hemoptysis  continue baby aspirin only,  3. Pain Management: tylenol prn pain. No current complaints     4. Mood: LCSW to follow for evaluation as mentation improves.  5. Neuropsych: This patient is notcapable of making decisions on hisown behalf. 6. Skin/Wound Care: routine pressure relief measures.  7. Fluids/Electrolytes/Nutrition: Monitor I/O and adjust fluid boluses as needed. Continue tube feeds. No need to increase free H2O BMP Latest Ref Rng & Units 08/13/2016 08/06/2016 07/30/2016  Glucose 65 - 99 mg/dL 93 85 101(H)  BUN 6 - 20 mg/dL 23(H) 19 14  Creatinine 0.61 - 1.24 mg/dL 0.75 0.92 0.75  Sodium 135 - 145 mmol/L 140 139 138  Potassium 3.5 - 5.1 mmol/L 4.1 4.2 3.8  Chloride 101 - 111 mmol/L 101 99(L)  99(L)  CO2 22 - 32 mmol/L 29 30 32  Calcium 8.9 - 10.3 mg/dL 9.0 9.0 9.1  . 8. Tracheostomy d/ced per CCM     9. Parkinson's disease: Continue Sinemet 37.5/150 qid.   11. ABLA: Continue to monitor-no acute drops, Stool guaic neg, microscopic hematuria CBC Latest Ref Rng & Units 08/06/2016 08/05/2016 08/04/2016  WBC 4.0 - 10.5 K/uL 5.7 7.5 6.2  Hemoglobin 13.0 - 17.0 g/dL 9.8(L) 10.9(L) 9.7(L)  Hematocrit 39.0 - 52.0 % 31.9(L) 35.9(L) 31.8(L)  Platelets 150 - 400 K/uL 265 358 300     12. Hematuria: resolved , Hemoglobin steady-           13. Severe dysphagia: tolerating PEG feedings, Some therapeutic feeds with speech therapy, pt now decannulated, family training for PEG care this week 13.  UCx + staph epidermidis, likely skin contaminent, afebrile  UA with   neg nitrite, rare bacteria, no abx warranted at this time, will need to improve hydration, free water increased, voiding   trial this week if ok with Urology   LOS (Days) 25 A FACE TO FACE EVALUATION WAS PERFORMED  , E 08/13/2016, 6:13 AM    

## 2016-08-13 NOTE — Progress Notes (Signed)
Speech Language Pathology Daily Session Note  Patient Details  Name: Gregory [name] MRN: [id number] Date of Birth: 01/03/1941  Today's Date: 08/13/2016 SLP Individual Time: 1045-1130 SLP Individual Time Calculation (min): 45 min  Short Term Goals: Week 4: SLP Short Term Goal 1 (Week 4): Patient will consume trials of nectar-thick liquids via tsp without overt s/s of aspiration with Mod A multimodal cues for use of compensatory swallow strategies.  SLP Short Term Goal 2 (Week 4): Pt will demonstrate sustained attention to a functional task for ~ 5 minutes with Mod A verbal cues. for redirection to task.  SLP Short Term Goal 3 (Week 4): Pt will utilize external memory aids for recall of dialy information with Mod A verbal cues.  SLP Short Term Goal 4 (Week 4): Pt will solve basic familiar problems with Mod A verbal cues.  SLP Short Term Goal 5 (Week 4): With Max A multimodal cues, pt will state 1 cognitve deficit and 1 physical deficits related to current situation.   Skilled Therapeutic Interventions: Skilled ST focused on dysphagia and cognition goals. SLP facilitated session by providing skilled observation of pt consuming nectar thick liquid via spoon and cup. Pt with no overt s/s of aspiration with Min A use of compensatory swallow strategies. Pt with no overt s/s of aspiration with no chin tuck. Skilled observation provided during consumption of ice chips. Pt with coughing during consumption. Pt's wife and son present and education provided on current swallow function and pending Modified Barium Swallow Study. Cognitive task provided for similarities. Pt required Mod to Max A cues. Education provided to family on creating a very structured/predicted routine at home with use of external memory aids. Pt left upright in wheelchair, safety belt donned and all needs within reach. Continue per current plan of care.      Function:  Eating Eating   Modified Consistency Diet: Yes Eating Assist  Level: Set up assist for;Supervision or verbal cues;Helper checks for pocketed food;Helper scoops food on utensil;Helper brings food to mouth;Help managing cup/glass   Eating Set Up Assist For: Opening containers Helper Scoops Food on Utensil: Every scoop Helper Brings Food to Mouth: Every scoop   Cognition Comprehension Comprehension assist level: Understands basic 75 - 89% of the time/ requires cueing 10 - 24% of the time  Expression   Expression assist level: Expresses basic 75 - 89% of the time/requires cueing 10 - 24% of the time. Needs helper to occlude trach/needs to repeat words.  Social Interaction Social Interaction assist level: Interacts appropriately 75 - 89% of the time - Needs redirection for appropriate language or to initiate interaction.  Problem Solving Problem solving assist level: Solves basic 25 - 49% of the time - needs direction more than half the time to initiate, plan or complete simple activities  Memory Memory assist level: Recognizes or recalls 25 - 49% of the time/requires cueing 50 - 75% of the time    Pain Pain Assessment Pain Assessment: No/denies pain  Therapy/Group: Individual Therapy  Gregory [name] 08/13/2016, 12:29 PM

## 2016-08-13 NOTE — Progress Notes (Signed)
Physical Therapy Session Note  Patient Details  Name: Gregory LoronRichard Morandi MRN: [id number] Date of Birth: 01/17/1941  Today's Date: 08/13/2016 PT Individual Time: 1300-1420 PT Individual Time Calculation (min): 80 min   Short Term Goals: Week 4:  PT Short Term Goal 1 (Week 4): STG =LTG due to ELOS   Skilled Therapeutic Interventions/Progress Updates:  Pt presented in w/c agreeable to therapy.Propelled [id number] with w/c cues for increasing arm movement to facilitate propulsion and mod verbal cues from PTA and son for turns. Transported remaining distance to ortho gym for energy conservation. Discussed with family use of smaller w/c for use in house due to smaller width doors. Transferred to wife/s w/c (16x16) which pt was able to get in and out of and son was able to push up ramp. Ambulated to ortho gym and performed NuStep L4 x 5 min encouraging full range of movement. Ascend/decend 6in stairs x 8 with minA cues for full placement of foot on step. Pt with moderate fatigue after 8 steps. Performed standing balance activities including playing catch with no AD and using yellow therapy ball rebounding with rehab tech. Pt maintained Fair balance and able to reach outside BOS with no LOB. Pt ambulated back to room with RW with min guard, moderate cues for increasing step length and bilateral foot clearance. Pt returned to room and left in recliner and QRB on and family present.      Therapy Documentation Precautions:  Precautions Precautions: Fall Precaution Comments: B wrist restraints Restrictions Weight Bearing Restrictions: No General:   Vital Signs: Therapy Vitals Temp: 97.8 F (36.6 C) Temp Source: Oral Pulse Rate: 65 Resp: 17 BP: (!) 110/42 Patient Position (if appropriate): Sitting Oxygen Therapy SpO2: 100 % O2 Device: Not Delivered Pain:    See Function Navigator for Current Functional Status.   Therapy/Group: Individual Therapy  Shatoya Roets  Sakura Denis,  PTA  08/13/2016, 4:04 PM

## 2016-08-14 ENCOUNTER — Inpatient Hospital Stay (HOSPITAL_COMMUNITY): Payer: Medicare Other

## 2016-08-14 ENCOUNTER — Encounter (HOSPITAL_COMMUNITY): Payer: Medicare Other | Admitting: Psychology

## 2016-08-14 ENCOUNTER — Encounter (HOSPITAL_COMMUNITY): Payer: Medicare Other | Admitting: Speech Pathology

## 2016-08-14 ENCOUNTER — Inpatient Hospital Stay (HOSPITAL_COMMUNITY): Payer: Medicare Other | Admitting: Physical Therapy

## 2016-08-14 ENCOUNTER — Inpatient Hospital Stay (HOSPITAL_COMMUNITY): Payer: Medicare Other | Admitting: Occupational Therapy

## 2016-08-14 ENCOUNTER — Inpatient Hospital Stay (HOSPITAL_COMMUNITY): Payer: Medicare Other | Admitting: Speech Pathology

## 2016-08-14 LAB — GLUCOSE, CAPILLARY
GLUCOSE-CAPILLARY: 96 mg/dL (ref 65–99)
Glucose-Capillary: 84 mg/dL (ref 65–99)

## 2016-08-14 MED ORDER — BETHANECHOL CHLORIDE 10 MG PO TABS
10.0000 mg | ORAL_TABLET | Freq: Three times a day (TID) | ORAL | Status: DC
  Administered 2016-08-14 (×3): 10 mg via ORAL
  Filled 2016-08-14 (×4): qty 1

## 2016-08-14 MED ORDER — TAMSULOSIN HCL 0.4 MG PO CAPS
0.4000 mg | ORAL_CAPSULE | Freq: Every day | ORAL | Status: DC
  Administered 2016-08-14 – 2016-08-16 (×3): 0.4 mg via ORAL
  Filled 2016-08-14 (×3): qty 1
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
## 2016-08-14 NOTE — Progress Notes (Signed)
Speech Language Pathology Daily Session Note  Patient Details  Name: Gregory [name] MRN: [id number] Date of Birth: June 20, 1940  Today's Date: 08/14/2016 SLP Individual Time: 1530-1600 SLP Individual Time Calculation (min): 30 min  Short Term Goals: Week 4: SLP Short Term Goal 1 (Week 4): Patient will consume trials of nectar-thick liquids via tsp without overt s/s of aspiration with Mod A multimodal cues for use of compensatory swallow strategies.  SLP Short Term Goal 2 (Week 4): Pt will demonstrate sustained attention to a functional task for ~ 5 minutes with Mod A verbal cues. for redirection to task.  SLP Short Term Goal 3 (Week 4): Pt will utilize external memory aids for recall of dialy information with Mod A verbal cues.  SLP Short Term Goal 4 (Week 4): Pt will solve basic familiar problems with Mod A verbal cues.  SLP Short Term Goal 5 (Week 4): With Max A multimodal cues, pt will state 1 cognitve deficit and 1 physical deficits related to current situation.   Skilled Therapeutic Interventions: Skilled treatment session focused on cognition and dysphagia goals. SLP facilitated session by providing skilled observation of pt consuming thin water via cup sips and medicine whole with applesauce. Pt consumed without overt s/s of aspiration. Pt required Mod A verbal cues for use of compensatory swallow strategies (specifically throat clear every 2 to 3 swallows). RMT assess performed. Pt with MEP of 31 cmH2O.  Reference for his age is [address] with LLN of 54 cm H2O. Pt with MIP of 16 cm H2O with reference for his age being 89 and LLN of 51 cm H2O. Pt's values indicate need for RMT. Will set trainers on next available session. Education provided to son and SLP called daughter with POC. SLP to observe dysphagia 2 diet with thin liquids at breakfast and at lunch on 08/15/16 before initiating further upgrade. Pt was left in care of his son, upright in his wheelchair. Continue per current plan of care.       Function:  Eating Eating   Modified Consistency Diet: Yes Eating Assist Level: Set up assist for;Supervision or verbal cues;Helper checks for pocketed food;Helper scoops food on utensil;Helper brings food to mouth;Help managing cup/glass   Eating Set Up Assist For: Opening containers Helper Scoops Food on Utensil: Occasionally Helper Brings Food to Mouth: Occasionally   Cognition Comprehension Comprehension assist level: Understands basic 75 - 89% of the time/ requires cueing 10 - 24% of the time  Expression   Expression assist level: Expresses basic needs/ideas: With no assist  Social Interaction Social Interaction assist level: Interacts appropriately 75 - 89% of the time - Needs redirection for appropriate language or to initiate interaction.  Problem Solving Problem solving assist level: Solves basic 25 - 49% of the time - needs direction more than half the time to initiate, plan or complete simple activities  Memory Memory assist level: Recognizes or recalls 25 - 49% of the time/requires cueing 50 - 75% of the time    Pain    Therapy/Group: Individual Therapy   Titiana Severa B. Dreama Saaverton, M.S., CCC-SLP Speech-Language Pathologist   Mrytle Bento 08/14/2016, 4:11 PM
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
## 2016-08-14 NOTE — Progress Notes (Signed)
Subjective/Complaints:  2 caths recorded, no spontaneous voids recorded  ROS: Limited due cognitive/behavioral   Objective: Vital Signs: Blood pressure 132/60, pulse 83, temperature 97.7 F (36.5 C), temperature source Oral, resp. rate 16, height _0  (1.753 m), weight 85.5 kg (188 lb 9.6 oz), SpO2 100 %. No results found. Results for orders placed or performed during the hospital encounter of 07/19/16 (from the past 72 hour(s))  Glucose, capillary     Status: None   Collection Time: 08/11/16  6:53 AM  Result Value Ref Range   Glucose-Capillary 88 65 - 99 mg/dL  Glucose, capillary     Status: Abnormal   Collection Time: 08/11/16 11:49 AM  Result Value Ref Range   Glucose-Capillary 121 (H) 65 - 99 mg/dL  Glucose, capillary     Status: None   Collection Time: 08/11/16  4:18 PM  Result Value Ref Range   Glucose-Capillary 82 65 - 99 mg/dL  Glucose, capillary     Status: None   Collection Time: 08/11/16  9:27 PM  Result Value Ref Range   Glucose-Capillary 79 65 - 99 mg/dL  Glucose, capillary     Status: None   Collection Time: 08/12/16  7:02 AM  Result Value Ref Range   Glucose-Capillary 98 65 - 99 mg/dL   Comment 1 Notify RN   Glucose, capillary     Status: None   Collection Time: 08/12/16  5:19 PM  Result Value Ref Range   Glucose-Capillary 77 65 - 99 mg/dL  Glucose, capillary     Status: None   Collection Time: 08/12/16  9:57 PM  Result Value Ref Range   Glucose-Capillary 78 65 - 99 mg/dL  Basic metabolic panel     Status: Abnormal   Collection Time: 08/13/16  5:14 AM  Result Value Ref Range   Sodium 140 135 - 145 mmol/L   Potassium 4.1 3.5 - 5.1 mmol/L   Chloride 101 101 - 111 mmol/L   CO2 29 22 - 32 mmol/L   Glucose, Bld 93 65 - 99 mg/dL   BUN 23 (H) 6 - 20 mg/dL   Creatinine, Ser 0.75 0.61 - 1.24 mg/dL   Calcium 9.0 8.9 - 10.3 mg/dL   GFR calc non Af Amer >60 >60 mL/min   GFR calc Af Amer >60 >60 mL/min    Comment: (NOTE) The eGFR has been calculated using  the CKD EPI equation. This calculation has not been validated in all clinical situations. eGFR's persistently <60 mL/min signify possible Chronic Kidney Disease.    Anion gap 10 5 - 15  Glucose, capillary     Status: None   Collection Time: 08/13/16  6:49 AM  Result Value Ref Range   Glucose-Capillary 91 65 - 99 mg/dL   Comment 1 Notify RN   Glucose, capillary     Status: Abnormal   Collection Time: 08/13/16 11:51 AM  Result Value Ref Range   Glucose-Capillary 117 (H) 65 - 99 mg/dL  Glucose, capillary     Status: None   Collection Time: 08/13/16  4:35 PM  Result Value Ref Range   Glucose-Capillary 93 65 - 99 mg/dL  Glucose, capillary     Status: None   Collection Time: 08/13/16  9:53 PM  Result Value Ref Range   Glucose-Capillary 93 65 - 99 mg/dL     HEENT: Normocephalic. Healing trauma Neck: #6 shiley, decreased secretions Cardio: RRR.  Resp: normal effort. occ rhonchi GI: BS positive and ND, PEG site clean, dry and intact. Nontender  to palpation. No drainage  Skin:    , Motor: 4/5 in bue and BLE Musc/Skel:  No edema. Mild R rib tenderness.  Gen NAD, Vital signs reviewed.  GU Foley bag .golden colored urine  Assessment/Plan: 1. Functional deficits secondary to bilateral cortical and subcortical cardioembolic infarcts which require 3+ hours per day of interdisciplinary therapy in a comprehensive inpatient rehab setting. Physiatrist is providing close team supervision and 24 hour management of active medical problems listed below. Physiatrist and rehab team continue to assess barriers to discharge/monitor patient progress toward functional and medical goals. FIM: Function - Bathing Bathing activity did not occur: Refused Position: Shower Body parts bathed by patient: Right arm, Left arm, Chest, Abdomen, Front perineal area, Right upper leg, Left upper leg, Right lower leg, Left lower leg Body parts bathed by helper: Buttocks, Back Bathing not applicable: Front perineal  area, Buttocks Assist Level: Touching or steadying assistance(Pt > 75%)  Function- Upper Body Dressing/Undressing What is the patient wearing?: Pull over shirt/dress Pull over shirt/dress - Perfomed by patient: Thread/unthread right sleeve, Thread/unthread left sleeve, Put head through opening, Pull shirt over trunk Pull over shirt/dress - Perfomed by helper: Pull shirt over trunk Assist Level: Supervision or verbal cues Function - Lower Body Dressing/Undressing What is the patient wearing?: Pants, Socks, Shoes Position: Wheelchair/chair at [employer] - Performed by patient: Thread/unthread left underwear leg Pants- Performed by patient: Thread/unthread right pants leg, Thread/unthread left pants leg, Pull pants up/down Pants- Performed by helper: Pull pants up/down Non-skid slipper socks- Performed by patient: Don/doff left sock Non-skid slipper socks- Performed by helper: Don/doff right sock Socks - Performed by patient: Don/doff right sock Socks - Performed by helper: Don/doff left sock Shoes - Performed by patient: Don/doff right shoe, Don/doff left shoe, Fasten right, Fasten left Shoes - Performed by helper: Don/doff right shoe, Fasten left Assist for footwear: Partial/moderate assist Assist for lower body dressing: Touching or steadying assistance (Pt > 75%)  Function - Toileting Toileting activity did not occur: No continent bowel/bladder event Toileting steps completed by patient: Performs perineal hygiene Toileting steps completed by helper: Adjust clothing prior to toileting, Adjust clothing after toileting Toileting Assistive Devices: Grab bar or rail Assist level: Touching or steadying assistance (Pt.75%)  Function - Air cabin crew transfer activity did not occur:  (no bm. foley in place) Toilet transfer assistive device: Elevated toilet seat/BSC over toilet, Walker Mechanical lift: Stedy Assist level to toilet: Touching or steadying assistance (Pt >  75%) Assist level from toilet: Touching or steadying assistance (Pt > 75%) Assist level to bedside commode (at bedside): 2 helpers Assist level from bedside commode (at bedside): 2 helpers  Function - Chair/bed transfer Chair/bed transfer method: Stand pivot, Ambulatory Chair/bed transfer assist level: Touching or steadying assistance (Pt > 75%) Chair/bed transfer assistive device: Armrests, Environmental manager lift: Stedy Chair/bed transfer details: Verbal cues for technique, Tactile cues for posture, Verbal cues for sequencing, Manual facilitation for weight shifting, Manual facilitation for placement  Function - Locomotion: Wheelchair Will patient use wheelchair at discharge?: Yes Type: Manual Wheelchair activity did not occur: Safety/medical concerns Max wheelchair distance: 169f  Assist Level: Supervision or verbal cues Wheel 50 feet with 2 turns activity did not occur: Safety/medical concerns Assist Level: Supervision or verbal cues Wheel 150 feet activity did not occur: Safety/medical concerns Assist Level: Touching or steadying assistance (Pt > 75%) Turns around,maneuvers to table,bed, and toilet,negotiates 3% grade,maneuvers on rugs and over doorsills: No Function - Locomotion: Ambulation Ambulation activity did not occur:  Safety/medical concerns Assistive device: Walker-rolling Max distance:  (150 ft) Assist level: Touching or steadying assistance (Pt > 75%) Walk 10 feet activity did not occur: Safety/medical concerns Assist level: Touching or steadying assistance (Pt > 75%) Walk 50 feet with 2 turns activity did not occur: Safety/medical concerns Assist level: Touching or steadying assistance (Pt > 75%) Walk 150 feet activity did not occur: Safety/medical concerns Assist level: Touching or steadying assistance (Pt > 75%) Walk 10 feet on uneven surfaces activity did not occur: Safety/medical concerns  Function - Comprehension Comprehension: Auditory Comprehension assist  level: Understands basic 75 - 89% of the time/ requires cueing 10 - 24% of the time  Function - Expression Expression: Verbal Expression assistive device: Talk trach valve Expression assist level: Expresses basic 75 - 89% of the time/requires cueing 10 - 24% of the time. Needs helper to occlude trach/needs to repeat words.  Function - Social Interaction Social Interaction assist level: Interacts appropriately 75 - 89% of the time - Needs redirection for appropriate language or to initiate interaction.  Function - Problem Solving Problem solving assist level: Solves basic 25 - 49% of the time - needs direction more than half the time to initiate, plan or complete simple activities  Function - Memory Memory assist level: Recognizes or recalls 25 - 49% of the time/requires cueing 50 - 75% of the time Patient normally able to recall (first 3 days only): Staff names and faces, That he or she is in a hospital  Medical Problem List and Plan: 1. 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 DVT Prophylaxis/Anticoagulation: Pharmaceutical: Other (comment)-- heparin, warfarin, or DOAC cause hematuria and hemoptysis  continue baby aspirin only,  3. Pain Management: tylenol prn pain. No current complaints     4. Mood: LCSW to follow for evaluation as mentation improves.  5. Neuropsych: This patient is notcapable of making decisions on hisown behalf. 6. Skin/Wound Care: routine pressure relief measures.  7. Fluids/Electrolytes/Nutrition: Monitor I/O and adjust fluid boluses as needed. Continue tube feeds. No need to increase free H2O BMP Latest Ref Rng & Units 08/13/2016 08/06/2016 07/30/2016  Glucose 65 - 99 mg/dL 93 85 101(H)  BUN 6 - 20 mg/dL 23(H) 19 14  Creatinine 0.61 - 1.24 mg/dL 0.75 0.92 0.75  Sodium 135 - 145 mmol/L 140 139 138  Potassium 3.5 - 5.1 mmol/L 4.1 4.2 3.8  Chloride 101  - 111 mmol/L 101 99(L) 99(L)  CO2 22 - 32 mmol/L 29 30 32  Calcium 8.9 - 10.3 mg/dL 9.0 9.0 9.1  .  9. Parkinson's disease: Continue Sinemet 37.5/150 qid.   11. ABLA: Continue to monitor-no acute drops, Stool guaic neg, microscopic hematuria CBC Latest Ref Rng & Units 08/06/2016 08/05/2016 08/04/2016  WBC 4.0 - 10.5 K/uL 5.7 7.5 6.2  Hemoglobin 13.0 - 17.0 g/dL 9.8(L) 10.9(L) 9.7(L)  Hematocrit 39.0 - 52.0 % 31.9(L) 35.9(L) 31.8(L)  Platelets 150 - 400 K/uL 265 358 300     12. Hematuria: resolved , Hemoglobin steady-We'll monitor now that intermittent cath program is ongoing           13. Severe dysphagia: tolerating PEG feedings, Some therapeutic feeds with speech therapy, pt now decannulated, family training for PEG care this week 13.  Urinary retention, voiding trial, add urecholine and flomax, No blood in urine, no difficulty with cathing  LOS (Days) 26 A FACE TO FACE EVALUATION WAS PERFORMED  Charlett Blake 08/14/2016, 6:32 AM

## 2016-08-14 NOTE — Progress Notes (Signed)
Physical Therapy Session Note  Patient Details  Name: Gregory LoronRichard Eid MRN: [id number] Date of Birth: 1940/11/19  Today's Date: 08/14/2016 PT Individual Time: 1415-1530 PT Individual Time Calculation (min): 75 min   Short Term Goals: Week 4:  PT Short Term Goal 1 (Week 4): STG =LTG due to ELOS   Skilled Therapeutic Interventions/Progress Updates: Pt presented in bed with son present agreeable to therapy. Practiced supine to sit with HOB elevated no rails as family's plan is now to go home without hospital bed. Pt required minA and instructed in use of elbow and LUE for push off to assist with truncal support. Pt transferred to w/c with min guard. Supine to/from sit on ADL bed with pt requiring minA for RLE elevation onto mat. Pt able to perform supine to sit with min guard and cues. Son present and provided pt edu on techniques to facilitate transfer. Performed stair training for LE strengthening, pt able to ascend/descend x 12 steps with step to pattern and min guard.Occasional cues for step to vs step through pattern. Balance/coordination toe taps to target with HHA. Pt with no LOB however required min cues for correction of positional changes. NuStep L4 x 10 min for endurance and LE strengthening. Pt ambulated to day room with min guard and RW with cues for increasing step length and decreasing festinating gait. Pt handed off to SLP with son present.       Therapy Documentation Precautions:  Precautions Precautions: Fall Precaution Comments: B wrist restraints Restrictions Weight Bearing Restrictions: No     See Function Navigator for Current Functional Status.   Therapy/Group: Individual Therapy  Mikenzie Mccannon  Shallen Luedke, PTA  08/14/2016, 3:49 PM

## 2016-08-14 NOTE — Progress Notes (Signed)
Occupational Therapy Session Note  Patient Details  Name: Gregory [name] MRN: [id number] Date of Birth: 09-14-1940  Today's Date: 08/14/2016 OT Individual Time: 1105-1207 OT Individual Time Calculation (min): 62 min    Short Term Goals: Week 4:  OT Short Term Goal 1 (Week 4): Continue working on established min assist to supervision level goals for discharge.   Skilled Therapeutic Interventions/Progress Updates:    Pt seen for OT session focusing on functional transfers and family training in prep for d/c. Pt in supine upon arrival with son present. Pt easily awoken and agreeable to tx session.  Discussed at length with pt's son pros and cons of getting hospital bed for home vs using standard bed. Discussed in regards to bed mobility, safety, etc. Pt's son strongly wanting standard bed in hopes pt will not want to spend time in bed at home. Also discussed recommendations for toileting, both day and night time schedule. Reviewed OT/PT goals of min A standing balance and pt's need for hands on assist when pt up and moving.  Educated regarding pt's ataxia, and motor planning problems when pt is instructing pt with mobility.  Completed bed mobility and transfers from standard bed, pt required mod A and verbal and tactile cuing to come to EOB. Completed x2 trials with therapist proving assist first and then pt's son return demonstrating.  Pt voiced need for toileting task. He ambulated with min A into bathroom, steadying assist to pull pants down, pt unable to void. At end of session, pt voiced need for toileting again. This time pt's son assisted with VCs for therapist for RW management.  Pt returned to bed at end of session, left in supine with bed alarm on.    Therapy Documentation Precautions:  Precautions Precautions: Fall Precaution Comments: B wrist restraints Restrictions Weight Bearing Restrictions: No Pain:   No/ denies pain  See Function Navigator for Current Functional  Status.   Therapy/Group: Individual Therapy  Lewis, Talon Regala C 08/14/2016, 7:19 AM

## 2016-08-14 NOTE — Progress Notes (Signed)
Speech Language Pathology Note  Patient Details  Name: Emelia LoronRichard Mcginniss MRN: [id number] Date of Birth: 07/26/1940 Today's Date: 08/14/2016  MBSS complete see full report under imaging section.  Lizzy Hamre B. Dreama Saaverton, M.S., CCC-SLP Speech-Language Pathologist    Annabell Oconnor 08/14/2016, 11:28 AM

## 2016-08-15 ENCOUNTER — Inpatient Hospital Stay (HOSPITAL_COMMUNITY): Payer: Medicare Other | Admitting: Physical Therapy

## 2016-08-15 ENCOUNTER — Inpatient Hospital Stay (HOSPITAL_COMMUNITY): Payer: Medicare Other | Admitting: Occupational Therapy

## 2016-08-15 ENCOUNTER — Inpatient Hospital Stay (HOSPITAL_COMMUNITY): Payer: Medicare Other | Admitting: Speech Pathology

## 2016-08-15 LAB — GLUCOSE, CAPILLARY
GLUCOSE-CAPILLARY: 105 mg/dL — AB (ref 65–99)
GLUCOSE-CAPILLARY: 98 mg/dL (ref 65–99)
Glucose-Capillary: 98 mg/dL (ref 65–99)
Glucose-Capillary: 98 mg/dL (ref 65–99)

## 2016-08-15 MED ORDER — GERHARDT'S BUTT CREAM
TOPICAL_CREAM | Freq: Three times a day (TID) | CUTANEOUS | Status: DC
  Administered 2016-08-15: 14:00:00 via TOPICAL
  Administered 2016-08-16 (×2): 1 via TOPICAL
  Administered 2016-08-16 – 2016-08-17 (×3): via TOPICAL
  Filled 2016-08-15: qty 1

## 2016-08-15 NOTE — Progress Notes (Signed)
RN educated daughter on management of foley care and PEG tube care. RN demonstrated proper cleaning technique and discussed signs and symptoms of infection.

## 2016-08-15 NOTE — Progress Notes (Signed)
Speech Language Pathology Daily Session Note  Patient Details  Name: Gregory [name] MRN: [id number] Date of Birth: 1940-07-07  Today's Date: 08/15/2016   Treatment session #1 SLP Individual Time: 0830-0900 SLP Individual Time Calculation (min): 30 min   Treatment session #2 SLP Individual Time: 1130-1200 SLP Individual Time Calculation (min): 30  Short Term Goals: Week 4: SLP Short Term Goal 1 (Week 4): Patient will consume trials of nectar-thick liquids via tsp without overt s/s of aspiration with Mod A multimodal cues for use of compensatory swallow strategies.  SLP Short Term Goal 2 (Week 4): Pt will demonstrate sustained attention to a functional task for ~ 5 minutes with Mod A verbal cues. for redirection to task.  SLP Short Term Goal 3 (Week 4): Pt will utilize external memory aids for recall of dialy information with Mod A verbal cues.  SLP Short Term Goal 4 (Week 4): Pt will solve basic familiar problems with Mod A verbal cues.  SLP Short Term Goal 5 (Week 4): With Max A multimodal cues, pt will state 1 cognitve deficit and 1 physical deficits related to current situation.   Skilled Therapeutic Interventions:  Skilled treatment session #1 - focused on cognition and dysphagia goals. SLP facilitated session by providing skilled observation of pt consuming dysphagia 2 breakfast tray with thin liquids via cup. Pt required Mod A verbal cues for use of compensatory swallow strategies. Pt with moderately distracting environment and no increase in s/s of aspiration. Pt with intermittent throat clear and need for cued cough. Pt with continued obvious confusion re current situation and visual perception that other people were in the room. Pt left upright in wheelchair, nursing and daughter present. Pt consumed medicine whole with applesauce without overt s/s of aspiration. Continue per current plan of care.   Skilled treatment session #2 - focused on cognition and dysphagia goals. SLP  facilitated session by providing skilled observation of pt consuming dysphagia 2 lunch tray with thin liquids via cup. [name]-in-law present and states that pt had baseline throat clear when eating. He had throat clear during this meal and daughter-in-law stated that "it - throat clear - sounded normal." Pt required Mod A cues for use of compensatory swallow strategies. Pt is appropriate for diet upgrade to dysphagia 2 with thin liquids via straw. Educational information left in room on current diet and swallow strategies. Pt with continued confusion re current situation and was difficult to redirect. Pt states that he is able to ambulate around room with walker. Extensive education provided on need for 24 hour supervision. Pt left with daughter-in-law and all needs within reach. Continue per current plan of care.      Function:  Eating Eating   Modified Consistency Diet: Yes Eating Assist Level: Set up assist for;Supervision or verbal cues;Helper checks for pocketed food     Mudlogger on Utensil: Occasionally     Cognition Comprehension Comprehension assist level: Understands basic 75 - 89% of the time/ requires cueing 10 - 24% of the time  Expression   Expression assist level: Expresses basic needs/ideas: With no assist  Social Interaction Social Interaction assist level: Interacts appropriately 75 - 89% of the time - Needs redirection for appropriate language or to initiate interaction.  Problem Solving Problem solving assist level: Solves basic 25 - 49% of the time - needs direction more than half the time to initiate, plan or complete simple activities  Memory Memory assist level: Recognizes or recalls 25 - 49% of the time/requires  cueing 50 - 75% of the time    Pain    Therapy/Group: Individual Therapy  Gregory [name] 08/15/2016, 9:49 AM

## 2016-08-15 NOTE — Progress Notes (Signed)
Occupational Therapy Session Note  Patient Details  Name: Gregory [name] MRN: [id number] Date of Birth: March 01, 1941  Today's Date: 08/15/2016 OT Individual Time: [id number] OT Individual Time Calculation (min): 43 min    Short Term Goals: Week 4:  OT Short Term Goal 1 (Week 4): Continue working on established min assist to supervision level goals for discharge.   Skilled Therapeutic Interventions/Progress Updates:    Pt seen for OT session focusing on ADL re-training and education with family. Pt sitting up in w/c upon arrival, daughter present and family in/out of room throughout session. He completed bathing/dressing task from w/c level at sink. Pt able to cross ankle over knee to don/doff socks and wash feet. He stood with light steadying assist to complete buttock hygiene. Bleeding noted coming from foley catheter, made RN aware, she was already aware as it has been bleeding since last night.  He required min A for clothing orientation of pants, able to dress UB with set-up assist. Pt completed oral care standing at sink with CGA. Pt left seated in w/c at end of session with son present. QRB donned.   Therapy Documentation Precautions:  Precautions Precautions: Fall Precaution Comments: B wrist restraints Restrictions Weight Bearing Restrictions: No Pain:   No/ denies pain  See Function Navigator for Current Functional Status.   Therapy/Group: Individual Therapy  Lewis, Fianna Snowball C 08/15/2016, 7:12 AM

## 2016-08-15 NOTE — Progress Notes (Signed)
Physical Therapy Session Note  Patient Details  Name: Gregory [name] MRN: [id number] Date of Birth: 1941/01/02  Today's Date: 08/15/2016 PT Individual Time: 0900-0930 AND 1300-1410 AND 1600-1620 PT Individual Time Calculation (min): 30 min AND 5mn AND 20 min   Short Term Goals: Week 4:  PT Short Term Goal 1 (Week 4): STG =LTG due to ELOS   Skilled Therapeutic Interventions/Progress Updates:    Session 1 Pt received sitting in WC and agreeable to PT  Sit<>stand with supervision assist x 3 for PA to assess Foley catheter and RN perform perineal hygiene. [facility] and min cues to utilize RW for turn to prevent removal of Foley catheter and improved safety.   Gait training with PT using RW and supervision assist from PT x 15109ft. Side stepping and backward gait with RW and min assist from PT. Moderate cues from PT for AD management, gait pattern, and posture.   [facility] returned too room and [facility] and all needs met.    Session 2.   Pt received sitting in WC and agreeable to PT  Transfer training with supervision assist overall min assist x 1 due to posterior LOB. Min cues for increased anterior weight shift with improved carry over throughout treatment.   Gait x [id number] supervision assist and one instance of min assist in turn for AD Management. Moderate cues for increased heel contact and stride length; able to maintain up to 35ft.46ftair training instructed by PT to ascend/descend 8 stairs  With BUE support on min assist from PT. Only min cues for proper step to gait pattern with descent. No LOB noted.   Bed mobility with min assist progressing to supervision assist from PT as well as mod cues for sequencing to perform sit<>supine through log roll to the L.   Discharge planning discussed with daughter to plan appropriate DME with concerns for width of doorways and  use of appropriate WC and RW. PT continues to recommend 18 inch wide WC with pressure relieving cushion due to skin break down with possibility to remove WC wheel rims to reduce overall chair width and continued use of BLE for WC propulsion.   Patient returned too room and [facility] and all needs met.       Session 3:   Gait with RW over various surfaces including carpet and tile floor x 135ft. 72frvision assist from PT and min-mod cues for increased step length and safety with turns.  Transfer from 17 inch sofa chair x 2 with mod assist from PT. Max cues for proper use of BUE and and improved anterior weight shift to prevent posterior LOB.   Patient returned too room and [facility] and all needs met.             Therapy Documentation Precautions:  Precautions Precautions: Fall Precaution Comments: B wrist restraints Restrictions Weight Bearing Restrictions: No    Vital Signs: Therapy Vitals Temp: 98.3 F (36.8 C) Temp Source: Oral Pulse Rate: (!) 59 Resp: 17 BP: (!) 107/56 Patient Position (if appropriate): Sitting Oxygen Therapy SpO2: 98 % O2 Device: Not Delivered Pain: 0/10   See Function Navigator for Current Functional Status.   Therapy/Group: Individual Therapy  Mili Piltz [name], 1:34 PM
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
## 2016-08-16 ENCOUNTER — Inpatient Hospital Stay (HOSPITAL_COMMUNITY): Payer: Medicare Other | Admitting: Physical Therapy

## 2016-08-16 ENCOUNTER — Inpatient Hospital Stay (HOSPITAL_COMMUNITY): Payer: Medicare Other | Admitting: Occupational Therapy

## 2016-08-16 ENCOUNTER — Inpatient Hospital Stay (HOSPITAL_COMMUNITY): Payer: Medicare Other | Admitting: Speech Pathology

## 2016-08-16 DIAGNOSIS — N401 Enlarged prostate with lower urinary tract symptoms: Secondary | ICD-10-CM

## 2016-08-16 DIAGNOSIS — R310 Gross hematuria: Secondary | ICD-10-CM

## 2016-08-16 DIAGNOSIS — R338 Other retention of urine: Secondary | ICD-10-CM

## 2016-08-16 LAB — GLUCOSE, CAPILLARY
GLUCOSE-CAPILLARY: 110 mg/dL — AB (ref 65–99)
GLUCOSE-CAPILLARY: 86 mg/dL (ref 65–99)

## 2016-08-16 LAB — CBC
HCT: 34.2 % — ABNORMAL LOW (ref 39.0–52.0)
HEMOGLOBIN: 10.4 g/dL — AB (ref 13.0–17.0)
MCH: 26.8 pg (ref 26.0–34.0)
MCHC: 30.4 g/dL (ref 30.0–36.0)
MCV: 88.1 fL (ref 78.0–100.0)
Platelets: 326 10*3/uL (ref 150–400)
RBC: 3.88 MIL/uL — ABNORMAL LOW (ref 4.22–5.81)
RDW: 16.8 % — AB (ref 11.5–15.5)
WBC: 6 10*3/uL (ref 4.0–10.5)

## 2016-08-16 MED ORDER — ENSURE ENLIVE PO LIQD
237.0000 mL | Freq: Two times a day (BID) | ORAL | Status: DC
  Administered 2016-08-17: 237 mL via ORAL

## 2016-08-16 MED ORDER — FREE WATER
50.0000 mL | Freq: Every day | Status: DC
  Administered 2016-08-16: 50 mL

## 2016-08-16 NOTE — Progress Notes (Signed)
Speech Language Pathology Discharge Summary  Patient Details  Name: Gregory [name] MRN: [id number] Date of Birth: [birth year]  Today's Date: 08/16/2016 SLP Individual Time: 0900-1010 SLP Individual Time Calculation (min): 70 min   Skilled Therapeutic Interventions:  Skilled treatment session focused on completion of caregiver education for appropriate dysphagia 2 diet, compensatory swallow strategies, and cognition strategies. Extensive education provided on establishing routine home environment to aid in pt's confusion. During session, pt with perseverative confusion regarding his keys. Pt allowed to look around room for keys, he didn't find them but remained confused that the keys were in another room. Pt in wheelchair with safety belt on which prevented his multiple attempts to get out of his chair to look for keys. SLP facilitated by having pt write down that his keys were at home. This strategy appeared to help in brief immediate present. Pt was left in wheelchair, safety belt donned, daughter present and all needs within reach.     Patient has met 8 of 8 long term goals.  Patient to discharge at overall Mod level.  Reasons goals not met:     Clinical Impression/Discharge Summary:   Pt has made great present in ST sessions. As a result, he has met all of his LTGs. He currently tolerates a dysphagia 2 diet with thin liquids via cup sips with strict aspiration precautions. Pt presents with intermittent throat clears at baseline when consuming PO intake. Additionally, pt presents with intermittent throat clear and cough with current diet. However he is able to protect his airway with his cough/throat clear. RMT has also been initiated with initial settings made. All education is complete with family and pt is appropriate to discharge with full continuous 24 hour supervision and Mod Assist at home.   Care Partner:  Caregiver Able to Provide Assistance: Yes  Type of Caregiver Assistance:  Physical;Cognitive  Recommendation:  24 hour supervision/assistance;Home Health SLP;Outpatient SLP  Rationale for SLP Follow Up: Maximize functional communication;Maximize cognitive function and independence;Maximize swallowing safety;Reduce caregiver burden   Equipment: N/A   Reasons for discharge: Treatment goals met   Patient/Family Agrees with Progress Made and Goals Achieved: Yes   Function:  Eating Eating   Modified Consistency Diet: Yes Eating Assist Level: Set up assist for;Supervision or verbal cues;Helper checks for pocketed food   Eating Set Up Assist For: Opening containers Helper Scoops Food on Utensil: Occasionally     Cognition Comprehension Comprehension assist level: Understands basic 75 - 89% of the time/ requires cueing 10 - 24% of the time  Expression   Expression assist level: Expresses basic needs/ideas: With no assist  Social Interaction Social Interaction assist level: Interacts appropriately 75 - 89% of the time - Needs redirection for appropriate language or to initiate interaction.  Problem Solving Problem solving assist level: Solves basic 25 - 49% of the time - needs direction more than half the time to initiate, plan or complete simple activities  Memory Memory assist level: Recognizes or recalls 25 - 49% of the time/requires cueing 50 - 75% of the time    Gervis Gaba B. Rutherford Nail, M.S., CCC-SLP Speech-Language Pathologist  Yuriy Cui 08/16/2016, 10:27 AM

## 2016-08-16 NOTE — Progress Notes (Signed)
Physical Therapy Discharge Summary  Patient Details  Name: Gregory [name] MRN: [id number] Date of Birth: 1941-02-03  Today's Date: 08/16/2016 PT Individual Time: 1749-4496 PT Individual Time Calculation (min): 80 min    Patient has met 10 of 10 long term goals due to improved activity tolerance, improved balance, improved postural control, increased strength, increased range of motion, improved attention, improved awareness and improved coordination.  Patient to discharge at an ambulatory level [place].   Patient's care partner is independent to provide the necessary physical and cognitive assistance at discharge.  Reasons goals not met: all rehab goals met   Recommendation:  Patient will benefit from ongoing skilled PT services in home health setting to continue to advance safe functional mobility, address ongoing impairments in balance, transfers, endurance, safety, cognition, and minimize fall risk.  Equipment: Wc and pressure relief cusion  Reasons for discharge: treatment goals met and discharge from hospital  Patient/family agrees with progress made and goals achieved: Yes   PT Treatment:  PT instructed pt in grad day assessment to measure progress toward goals; see below for results. Pt overall min assist with car transfer due to mild posterior LOB and min-supervision assist with gait and standard transfers. With RW for UE support. PT provided education for Pt and daughter for [facility] parts management, home exercise program, and safety with recommendation for 24 supervision to prevent fall. Following grad day assessment, pt returned to room and left sitting in [facility] with all needs met.    PT Discharge Precautions/Restrictions Restrictions Weight Bearing Restrictions: No Vital Signs Therapy Vitals Temp: 97.5 F (36.4 C) Temp Source: Oral Pulse Rate: (!) 59 Resp: 18 BP: (!) 115/52 Patient Position (if appropriate): Lying Oxygen Therapy SpO2: 99 % O2 Device: Not  Delivered Pain Pain Assessment Pain Assessment: No/denies pain Pain Score: 0-No pain Vision/Perception  Vision - Assessment Eye Alignment: Within Functional Limits Tracking/Visual Pursuits: Decreased smoothness of horizontal tracking Saccades: Undershoots Praxis Praxis-Other Comments: mild apraxia with new tasks. greatly improved from Evaluation  Cognition Overall Cognitive Status: Impaired/Different from baseline Arousal/Alertness: Awake/alert Orientation Level: Oriented to person;Oriented to place Attention: Sustained Focused Attention: Appears intact Sustained Attention: Impaired Sustained Attention Impairment: Verbal complex;Functional complex Memory: Impaired Memory Impairment: Decreased recall of new information Awareness: Impaired Awareness Impairment: Emergent impairment Problem Solving: Impaired Problem Solving Impairment: Verbal complex;Functional complex Executive Function:  (all higher levels are impaired d/t lower level deficits) Behaviors: Impulsive;Restless Safety/Judgment: Impaired Sensation Sensation Light Touch: Appears Intact Coordination Gross Motor Movements are Fluid and Coordinated: Yes Fine Motor Movements are Fluid and Coordinated: No Coordination and Movement Description: decreased coordination and speed of movements on BUE; greatly imrpoved from Evaluation.  Motor  Motor Motor: Abnormal postural alignment and control Motor - Discharge Observations: improved postural control and coordination of movements.   Mobility Bed Mobility Bed Mobility: Rolling Right;Rolling Left;Supine to Sit;Sit to Supine Rolling Right: 5: Supervision Rolling Right Details: Verbal cues for technique Rolling Left: 5: Supervision Rolling Left Details: Verbal cues for technique Supine to Sit: 4: Min guard Sit to Supine: 5: Supervision Sit to Supine - Details: Verbal cues for technique;Verbal cues for precautions/safety Transfers Transfers: Yes Sit to Stand: 5:  Supervision (with RW) Sit to Stand Details: Verbal cues for technique;Verbal cues for precautions/safety Stand to Sit: 5: Supervision Stand to Sit Details (indicate cue type and reason): Verbal cues for technique;Verbal cues for precautions/safety Stand Pivot Transfers: 5: Supervision (with RW) Stand Pivot Transfer Details: Verbal cues for technique Locomotion  Ambulation Ambulation: Yes Ambulation/Gait Assistance: 5:  Supervision;4: Min guard Ambulation Distance (Feet): 200 Feet Assistive device: Rolling walker Ambulation/Gait Assistance Details: Verbal cues for technique;Verbal cues for safe use of DME/AE;Verbal cues for precautions/safety;Tactile cues for placement;Tactile cues for posture Gait Gait: Yes Gait Pattern: Impaired Gait Pattern: Festinating Stairs / Additional Locomotion Stairs: Yes Stairs Assistance: 4: Min assist Stairs Assistance Details: Verbal cues for technique;Verbal cues for precautions/safety;Verbal cues for gait pattern;Verbal cues for safe use of DME/AE Stair Management Technique: Two rails Number of Stairs: 12 Height of Stairs: 6 Ramp: 4: Min assist Curb: 4: Min Chemical engineer: Yes Wheelchair Assistance: 5: Investment banker, operational Details: Verbal cues for technique;Verbal cues for Information systems manager: Both upper extremities;Both lower extermities Wheelchair Parts Management: Supervision/cueing;Needs assistance Distance: 157f  Trunk/Postural Assessment  Cervical Assessment Cervical Assessment: Exceptions to [facility] Thoracic Assessment Thoracic Assessment: Exceptions to [facility](rounded shoulders) Lumbar Assessment Lumbar Assessment: Exceptions to [facility] HospitalPostural Control Postural Control: Deficits on evaluation Righting Reactions: present in sitting, delayed in stansing.  Protective Responses: delayed without UE support in standing.   Balance Balance Balance Assessed: Yes Static  Sitting Balance Static Sitting - Balance Support: No upper extremity supported Static Sitting - Level of Assistance: 6: Modified independent (Device/Increase time) Dynamic Sitting Balance Dynamic Sitting - Balance Support: No upper extremity supported Dynamic Sitting - Level of Assistance: 5: Stand by assistance Static Standing Balance Static Standing - Balance Support: No upper extremity supported Static Standing - Level of Assistance: 5: Stand by assistance Dynamic Standing Balance Dynamic Standing - Balance Support: No upper extremity supported Dynamic Standing - Level of Assistance: 4: Min assist Extremity Assessment      RLE Assessment RLE Assessment: Within Functional Limits LLE Assessment LLE Assessment: Within Functional Limits   See Function Navigator for Current Functional Status.  ALorie Phenix3/29/2018, 3:47 PM

## 2016-08-16 NOTE — Progress Notes (Signed)
Social Work Patient ID: Gregory [name], male   DOB: April 01, 1941, 76 y.o.   MRN: [id number]  Spoke with Happi-Speech who reports pt is doing well on the Dys 2 thin liquid diet. No need for tube feeds At this time. Daughter taught yesterday how to flush PEG site and will have HHRN continue to follow to monitor at home. Equipment ordered and to be delivered today to pt's room. All fel comfortable with  discharge tomorrow. Pleased with progress and aware pt needs close supervision. Chair alarm in place now has been trying to get up and ambulate on his own.

## 2016-08-16 NOTE — Plan of Care (Signed)
Problem: RH BLADDER ELIMINATION Goal: RH STG MANAGE BLADDER WITH EQUIPMENT WITH ASSISTANCE STG Manage Bladder With Equipment With mod Assistance  Outcome: Not Progressing Foley in place.  Will d/c home with it
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
## 2016-08-16 NOTE — Progress Notes (Signed)
Occupational Therapy Discharge Summary  Patient Details  Name: Gregory [name] MRN: [id number] Date of Birth: 1941-01-27  Patient has met 11 of 11 long term goals due to improved activity tolerance, improved balance, postural control, ability to compensate for deficits, improved attention, improved awareness and improved coordination.  Patient to discharge at overall Supervision- min A level.  Patient's care partner is independent to provide the necessary physical and cognitive assistance at discharge.  Recommending 24 close supervision at d/c as pt impulsive with decreased awareness of deficits and poor safety awareness. Pt's family/ caregivers have been present throughout rehab stay and have been educated regarding pt's physical and cognitive deficits as well as recommendation for pt's assist level at home. All have voiced understanding and ability to provide needed assist.  Did not address home shower transfer while on IPR. Pt has only half bath on main level, 14 steps to reach second level. Have not addressed shower transfers with pt or family. Recommend HHOT assess as pt may go to son's house to shower if able.    Recommendation:  Patient will benefit from ongoing skilled OT services in home health setting to continue to advance functional skills in the area of BADL and Reduce care partner burden.  Equipment: BSC, RW  Reasons for discharge: treatment goals met and discharge from hospital  Patient/family agrees with progress made and goals achieved: Yes  OT Discharge Precautions/Restrictions  Precautions Precautions: Fall Precaution Booklet Issued: No Restrictions Weight Bearing Restrictions: No Vision/Perception  Vision- History Baseline Vision/History: Wears glasses Wears Glasses: Reading only Patient Visual Report: No change from baseline Vision- Assessment Vision Assessment?: Yes Eye Alignment: Within Functional Limits Tracking/Visual Pursuits: Decreased smoothness of  horizontal tracking Saccades: Undershoots Praxis Praxis-Other Comments: mild apraxia with new tasks. greatly improved from Evaluation  Cognition Overall Cognitive Status: Impaired/Different from baseline Arousal/Alertness: Awake/alert Orientation Level: Oriented to person;Oriented to place;Oriented to time;Oriented to situation Attention: Sustained Focused Attention: Appears intact Sustained Attention: Impaired Sustained Attention Impairment: Verbal complex;Functional complex Memory: Impaired Memory Impairment: Decreased recall of new information Awareness: Impaired Awareness Impairment: Emergent impairment Problem Solving: Impaired Problem Solving Impairment: Verbal complex;Functional complex Executive Function:  (All impaired due to low level cognitive impairments) Behaviors: Impulsive;Restless Safety/Judgment: Impaired Comments: Decreased awareness of deficits; decreased safety awareness Sensation Sensation Light Touch: Appears Intact Coordination Gross Motor Movements are Fluid and Coordinated: Yes Fine Motor Movements are Fluid and Coordinated: No Coordination and Movement Description: decreased coordination and speed of movements on BUE; greatly imrpoved from Evaluation.  Motor  Motor Motor: Abnormal postural alignment and control Motor - Discharge Observations: improved postural control and coordination of movements.  Mobility  Bed Mobility Bed Mobility: Rolling Right;Rolling Left;Supine to Sit;Sit to Supine Rolling Right: 5: Supervision Rolling Right Details: Verbal cues for technique Rolling Left: 5: Supervision Rolling Left Details: Verbal cues for technique Supine to Sit: 4: Min guard Sit to Supine: 5: Supervision Sit to Supine - Details: Verbal cues for technique;Verbal cues for precautions/safety Transfers Sit to Stand: 5: Supervision (with RW) Sit to Stand Details: Verbal cues for technique;Verbal cues for precautions/safety Stand to Sit: 5:  Supervision Stand to Sit Details (indicate cue type and reason): Verbal cues for technique;Verbal cues for precautions/safety  Trunk/Postural Assessment  Cervical Assessment Cervical Assessment: Exceptions to [facility] (forward head) Thoracic Assessment Thoracic Assessment: Exceptions to [facility] (rounded shoulders) Lumbar Assessment Lumbar Assessment: Exceptions to [facility] Postural Control Postural Control: Deficits on evaluation Righting Reactions: present in sitting, delayed in stansing.  Protective Responses: delayed without UE support in  standing.   Balance Balance Balance Assessed: Yes Static Sitting Balance Static Sitting - Balance Support: No upper extremity supported Static Sitting - Level of Assistance: 6: Modified independent (Device/Increase time) Dynamic Sitting Balance Dynamic Sitting - Balance Support: No upper extremity supported Dynamic Sitting - Level of Assistance: 5: Stand by assistance Static Standing Balance Static Standing - Balance Support: No upper extremity supported Static Standing - Level of Assistance: 5: Stand by assistance Dynamic Standing Balance Dynamic Standing - Balance Support: No upper extremity supported Dynamic Standing - Level of Assistance: 4: Min assist Extremity/Trunk Assessment RUE Strength RUE Overall Strength Comments: ROM ~120 degrees. Strength 4/5 throughout LUE Assessment LUE Assessment: Within Functional Limits LUE Strength LUE Overall Strength Comments: 4+/5 throughout   See Function Navigator for Current Functional Status.  Lewis,  C 08/16/2016, 4:32 PM 
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
## 2016-08-16 NOTE — Progress Notes (Signed)
Occupational Therapy Session Note  Patient Details  Name: Gregory [name] MRN: [id number] Date of Birth: 1940-06-05  Today's Date: 08/16/2016 OT Individual Time: 1100-1200 OT Individual Time Calculation (min): 60 min    Short Term Goals: Week 4:  OT Short Term Goal 1 (Week 4): Continue working on established min assist to supervision level goals for discharge.   Skilled Therapeutic Interventions/Progress Updates:    Pt seen for OT ADL bathing/dressing session. Pt sitting up in w/c upon arrival with daughter, [name] present. Pt agreeable to tx session and stating he is ready for d/c home tomorrow. He ambulated throughout session with close supervision- CGA using RW with VCs for larger step length and assist for RW management during functional ambulation/ transfers. He bathed seated on tub bench, at one point pt stood in shower as if to walk out, when therapist asked pt what he was doing pt stated " I was walking to go get the soap I forgot". Pt cont to display decreased awareness of deficits and poor safety awareness.  He returned to toilet to dress, completing with increased time and supervision with VCs for encouragement for independence. He ambulated out to sink to complete grooming tasks, however, began to have complaints of nausea and increased fatigue. Pt with difficulty keeping eyes open and desiring to wait to complete grooming tasks until another time. Pt left seated in w/c with daughter present and QRB donned.  Discussed with pt's daughter recommendation for 24 hour supervision and min A mobility/ transfers at home. She voiced understanding and stated family already had planned coverage.  Therapy Documentation Precautions:  Precautions Precautions: Fall Precaution Comments: B wrist restraints Restrictions Weight Bearing Restrictions: No  See Function Navigator for Current Functional Status.   Therapy/Group: Individual Therapy  Lewis, [name] 08/16/2016, 7:10 AM

## 2016-08-17 DIAGNOSIS — N179 Acute kidney failure, unspecified: Secondary | ICD-10-CM

## 2016-08-17 MED ORDER — FREE WATER: 100.0000 mL | Freq: Three times a day (TID) | Status: DC

## 2016-08-17 MED ORDER — GERHARDT'S BUTT CREAM: 1.0000 "application " | TOPICAL_CREAM | Freq: Three times a day (TID) | CUTANEOUS | 0 refills | Status: DC

## 2016-08-17 MED ORDER — TAMSULOSIN HCL 0.4 MG PO CAPS: 0.4000 mg | ORAL_CAPSULE | Freq: Every day | ORAL | 0 refills | Status: DC

## 2016-08-17 MED ORDER — PANTOPRAZOLE SODIUM 40 MG PO TBEC: 40.0000 mg | DELAYED_RELEASE_TABLET | Freq: Every day | ORAL | 0 refills | Status: DC

## 2016-08-17 MED ORDER — ASPIRIN 81 MG PO CHEW: 81.0000 mg | CHEWABLE_TABLET | Freq: Every day | ORAL | Status: DC

## 2016-08-17 MED ORDER — CARBIDOPA-LEVODOPA 25-100 MG PO TABS: 1.5000 | ORAL_TABLET | Freq: Four times a day (QID) | ORAL | Status: DC

## 2016-08-17 MED ORDER — FUROSEMIDE 40 MG PO TABS: 40.0000 mg | ORAL_TABLET | Freq: Every day | ORAL | 0 refills | Status: DC

## 2016-08-17 MED ORDER — FLUTICASONE PROPIONATE 50 MCG/ACT NA SUSP: 2.0000 | Freq: Two times a day (BID) | NASAL | 2 refills | Status: DC | PRN

## 2016-08-17 NOTE — Plan of Care (Signed)
Problem: RH BLADDER ELIMINATION Goal: RH STG MANAGE BLADDER WITH EQUIPMENT WITH ASSISTANCE STG Manage Bladder With Equipment With mod Assistance  Outcome: Not Met (add Reason) Failed numerous voiding trials, discharging home with foley catheter and to follow up with urology as outpatient.

## 2016-08-17 NOTE — Progress Notes (Signed)
Patient discharged home.  Left floor via wheelchair, escorted by nursing staff and family.  Patients family present, RN educated and demonstrated proper foley care, PEG care and flush.  Patients family verbalized understanding of discharge instructions as given by Marissa Nestle, PA.  All patient belongings sent with patient, including prescriptions and DME.  Appears to be in no immediate distress at this time.  Dani Gobble, RN

## 2016-08-17 NOTE — Discharge Instructions (Signed)
Inpatient Rehab Discharge Instructions  Gregory [name] Discharge date and time:  08/17/16  Activities/Precautions/ Functional Status: Activity: no lifting, driving, or strenuous exercise for till cleared by MD. Diet: Soft, chopped foods. Needs supervision at meals for safety.  Wound Care: keep wound clean and dry    Functional status:  ___ No restrictions     ___ Walk up steps independently _X__ 24/7 supervision/assistance   _X__ Walk up steps with assistance ___ Intermittent supervision/assistance  ___ Bathe/dress independently ___ Walk with walker     ___ Bathe/dress with assistance ___ Walk Independently    ___ Shower independently ___ Walk with assistance    ___ Shower with assistance _X__ No alcohol     ___ Return to work/school ________   Special Instructions:    COMMUNITY REFERRALS UPON DISCHARGE:    Home Health:   PT, OT, SP, RN  Agency:[facility] Phone:[phone]   Date of last service:08/17/2016  Medical Equipment/Items Ordered:WHEELCHAIR & BEDSIDE COMMODE  Agency/Supplier:[facility]   [phone]   GENERAL COMMUNITY RESOURCES FOR PATIENT/FAMILY: Support Groups:[facility] THIRD Tuesday @ 4:00 PM  @ [address] [place] [address] QUESTIONS CONTACT AMY MARRIOTT [phone]   STROKE/TIA DISCHARGE INSTRUCTIONS SMOKING Cigarette smoking nearly doubles your risk of having a stroke & is the single most alterable risk factor  If you smoke or have smoked in the last 12 months, you are advised to quit smoking for your health.  Most of the excess cardiovascular risk related to smoking disappears within a year of stopping.  Ask you doctor about anti-smoking medications  [facility] Quit Line: 1-800-QUIT NOW  Free Smoking Cessation Classes [phone]  CHOLESTEROL Know your levels; limit fat & cholesterol in your diet  Lipid Panel     Component Value Date/Time   CHOL 105 06/08/2016 0915   TRIG 70 06/08/2016 0915   HDL 24 (L)  06/08/2016 0915   CHOLHDL 4.4 06/08/2016 0915   VLDL 14 06/08/2016 0915   LDLCALC 67 06/08/2016 0915      Many patients benefit from treatment even if their cholesterol is at goal.  Goal: Total Cholesterol (CHOL) less than 160  Goal:  Triglycerides (TRIG) less than 150  Goal:  HDL greater than 40  Goal:  LDL (LDLCALC) less than 100   BLOOD PRESSURE American Stroke Association blood pressure target is less that 120/80 mm/Hg  Your discharge blood pressure is:  BP: 117/63  Monitor your blood pressure  Limit your salt and alcohol intake  Many individuals will require more than one medication for high blood pressure  DIABETES (A1c is a blood sugar average for last 3 months) Goal HGBA1c is under 7% (HBGA1c is blood sugar average for last 3 months)  Diabetes: No known diagnosis of diabetes    Lab Results  Component Value Date   HGBA1C <4.2 (L) 07/27/2016     Your HGBA1c can be lowered with medications, healthy diet, and exercise.  Check your blood sugar as directed by your physician  Call your physician if you experience unexplained or low blood sugars.  PHYSICAL ACTIVITY/REHABILITATION Goal is 30 minutes at least 4 days per week  Activity: No driving, Therapies: See above Return to work: N/a  Activity decreases your risk of heart attack and stroke and makes your heart stronger.  It helps control your weight and blood pressure; helps you relax and can improve your mood.  Participate in a regular exercise program.  Talk with your doctor about the best form of exercise  for you (dancing, walking, swimming, cycling).  DIET/WEIGHT Goal is to maintain a healthy weight  Your discharge diet is: DIET DYS 2 Room service appropriate? Yes; Fluid consistency: Thin  liquids Your height is:  Height:  (175.3 cm) Your current weight is: Weight: 86.9 kg (191 lb 9.3 oz) Your Body Mass Index (BMI) is:  BMI (Calculated): 28.3  Following the type of diet specifically designed for you will  help prevent another stroke.  Your goal weight is: 169 lbs  Your goal Body Mass Index (BMI) is 19-24.  Healthy food habits can help reduce 3 risk factors for stroke:  High cholesterol, hypertension, and excess weight.  RESOURCES Stroke/Support Group:  Call [phone]   STROKE EDUCATION PROVIDED/REVIEWED AND GIVEN TO PATIENT Stroke warning signs and symptoms How to activate emergency medical system (call 911). Medications prescribed at discharge. Need for follow-up after discharge. Personal risk factors for stroke. Pneumonia vaccine given:  Flu vaccine given:  My questions have been answered, the writing is legible, and I understand these instructions.  I will adhere to these goals & educational materials that have been provided to me after my discharge from the hospital.     Foley Catheter Care: A soft, flexible tube (Foley catheter) may have been placed in your bladder to drain urine and fluid. Follow these instructions: Taking Care of the Catheter  Keep the area where the catheter leaves your body clean.   Attach the catheter to the leg so there is no tension on the catheter.   Keep the drainage bag below the level of the bladder, but keep it OFF the floor.   Do not take long soaking baths. Your caregiver will give instructions about showering.   Wash your hands before touching ANYTHING related to the catheter or bag.   Using mild soap and warm water on a washcloth:   Clean the area closest to the catheter insertion site using a circular motion around the catheter.   Clean the catheter itself by wiping AWAY from the insertion site for several inches down the tube.   NEVER wipe upward as this could sweep bacteria up into the urethra (tube in your body that normally drains the bladder) and cause infection.   Place a small amount of sterile lubricant at the tip of the penis where the catheter is entering.  Taking Care of the Drainage Bags  Two drainage bags may be taken  home: a large overnight drainage bag, and a smaller leg bag which fits underneath clothing.   It is okay to wear the overnight bag at any time, but NEVER wear the smaller leg bag at night.   Keep the drainage bag well below the level of your bladder. This prevents backflow of urine into the bladder and allows the urine to drain freely.   Anchor the tubing to your leg to prevent pulling or tension on the catheter. Use tape or a leg strap provided by the hospital.   Empty the drainage bag when it is 1/2 to 3/4 full. Wash your hands before and after touching the bag.   Periodically check the tubing for kinks to make sure there is no pressure on the tubing which could restrict the flow of urine.  Changing the Drainage Bags  Cleanse both ends of the clean bag with alcohol before changing.   Pinch off the rubber catheter to avoid urine spillage during the disconnection.   Disconnect the dirty bag and connect the clean one.   Empty the  dirty bag carefully to avoid a urine spill.   Attach the new bag to the leg with tape or a leg strap.  Cleaning the Drainage Bags  Whenever a drainage bag is disconnected, it must be cleaned quickly so it is ready for the next use.   Wash the bag in warm, soapy water.   Rinse the bag thoroughly with warm water.   Soak the bag for 30 minutes in a solution of white vinegar and water (1 cup vinegar to 1 quart warm water).   Rinse with warm water.  SEEK MEDICAL CARE IF:   You have chills or night sweats.   You are leaking around your catheter or have problems with your catheter. It is not uncommon to have sporadic leakage around your catheter as a result of bladder spasms. If the leakage stops, there is not much need for concern. If you are uncertain, call your caregiver.   You develop side effects that you think are coming from your medicines.  SEEK IMMEDIATE MEDICAL CARE IF:   You are suddenly unable to urinate. Check to see if there are any kinks in  the drainage tubing that may cause this. If you cannot find any kinks, call your caregiver immediately. This is an emergency.   You develop shortness of breath or chest pains.   Bleeding persists or clots develop in your urine.   You have a fever.   You develop pain in your back or over your lower belly (abdomen).   You develop pain or swelling in your legs.   Any problems you are having get worse rather than better.  MAKE SURE YOU:   Understand these instructions.   Will watch your condition.   Will get help right away if you are not doing well or get worse.    My questions have been answered and I understand these instructions. I will adhere to these goals and the provided educational materials after my discharge from the hospital.  Patient/Caregiver Signature _______________________________ Date __________  Clinician Signature _______________________________________ Date __________  Please bring this form and your medication list with you to all your follow-up doctor's appointments.

## 2016-08-17 NOTE — Consult Note (Signed)
Patient has failed several voiding trials.  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  Agree with foley, we will have him follow-up in clinic in 2-3 weeks.
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
## 2016-08-17 NOTE — Progress Notes (Signed)
Social Work Discharge Note  The overall goal for the admission was met for:   Discharge location: Yes - home  Length of Stay: Yes - 30 days  Discharge activity level: Yes  Home/community participation: Yes  Services provided included: MD, RD, PT, OT, SLP, RN, Pharmacy, Neuropsych and SW  Financial Services: Medicare - [employer] Medicare  Follow-up services arranged: Home Health: PT/RN/OT/ST, DME: 18"x18" lightweight w/c with foot rests; Jay2 cushion; bedside commode and Patient/Family request agency HH: [facility], DME: [facility]  Comments (or additional information): Pt to d/c home with his family to provide 24/7 care.  They received family education and feel prepared to care for pt at home.  HH to follow, as well.  DME delivered to the room.    Patient/Family verbalized understanding of follow-up arrangements: Yes  Individual responsible for coordination of the follow-up plan: pt's wife and dtr  Confirmed correct DME delivered: ,  [facility] 08/17/2016    ,  [facility] 

## 2016-08-20 ENCOUNTER — Telehealth: Payer: Self-pay

## 2016-08-20 ENCOUNTER — Telehealth: Payer: Self-pay | Admitting: Neurology

## 2016-08-20 MED ORDER — CARBIDOPA-LEVODOPA ER 50-200 MG PO TBCR: 1.0000 | EXTENDED_RELEASE_TABLET | Freq: Three times a day (TID) | ORAL | 1 refills | Status: DC
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
## 2016-08-20 NOTE — Addendum Note (Signed)
Addended by: York Spaniel on: 08/20/2016 03:09 PM   Modules accepted: Orders
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
## 2016-08-20 NOTE — Telephone Encounter (Signed)
I called patient. I talk with the wife. The patient had been the hospital, they switched him from the Sinemet CR to the IR tablet taking the 25/100 mg tablets, 1-1/2 tablets 4 times daily. The patient had been doing better on the Sinemet CR 50/200 mg tablets, taking 1 tablet 3 times daily. I will switch him back to this, prescription was called in.

## 2016-08-20 NOTE — Telephone Encounter (Signed)
Transitional Care call  Patient name: Gregory [name]   DOB [birth year] 1. Are you/is patient experiencing any problems since coming home? no a. Are there any questions regarding any aspect of care? no 2. Are there any questions regarding medications administration/dosing? Yes, change in medication for his parkinsons, advised to speak with Dr. Wynn Banker. a. Are meds being taken as prescribed? Yes  b. "Patient should review meds with caller to confirm" yes 3. Have there been any falls? no 4. Has Home Health been to the house and/or have they contacted you? yes a. If not, have you tried to contact them? no b. Can we help you contact them? no 5. Are bowels and bladder emptying properly? yes a. Are there any unexpected incontinence issues? no b. If applicable, is patient following bowel/bladder programs? no 6. Any fevers, problems with breathing, unexpected pain? no 7. Are there any skin problems or new areas of breakdown? no 8. Has the patient/family member arranged specialty MD follow up (ie cardiology/neurology/renal/surgical/etc.)?  no a. Can we help arrange?  no 9. Does the patient need any other services or support that we can help arrange? no 10. Are caregivers following through as expected in assisting the patient? yes 11. Has the patient quit smoking, drinking alcohol, or using drugs as recommended? na  Appointment time 2:15PM 08-28-16, arrive time 1:45PM and who it is with here Dr. Wynn Banker [address]

## 2016-08-23 ENCOUNTER — Encounter: Payer: Self-pay | Admitting: Emergency Medicine

## 2016-08-23 ENCOUNTER — Emergency Department
Admission: EM | Admit: 2016-08-23 | Discharge: 2016-08-23 | Disposition: A | Discharge status: Home / Self Care | Payer: Medicare Other | Attending: Emergency Medicine | Admitting: Emergency Medicine

## 2016-08-23 DIAGNOSIS — Z7982 Long term (current) use of aspirin: Secondary | ICD-10-CM | POA: Insufficient documentation

## 2016-08-23 DIAGNOSIS — I509 Heart failure, unspecified: Secondary | ICD-10-CM | POA: Insufficient documentation

## 2016-08-23 DIAGNOSIS — R339 Retention of urine, unspecified: Secondary | ICD-10-CM | POA: Insufficient documentation

## 2016-08-23 DIAGNOSIS — Z87891 Personal history of nicotine dependence: Secondary | ICD-10-CM | POA: Insufficient documentation

## 2016-08-23 DIAGNOSIS — Z951 Presence of aortocoronary bypass graft: Secondary | ICD-10-CM | POA: Insufficient documentation

## 2016-08-23 DIAGNOSIS — I2510 Atherosclerotic heart disease of native coronary artery without angina pectoris: Secondary | ICD-10-CM | POA: Diagnosis not present

## 2016-08-23 DIAGNOSIS — G20 Parkinson's disease: Secondary | ICD-10-CM | POA: Diagnosis not present

## 2016-08-23 DIAGNOSIS — J9610 Chronic respiratory failure, unspecified whether with hypoxia or hypercapnia: Secondary | ICD-10-CM | POA: Insufficient documentation

## 2016-08-23 LAB — URINALYSIS, COMPLETE (UACMP) WITH MICROSCOPIC
BACTERIA UA: NONE SEEN
Bilirubin Urine: NEGATIVE
Glucose, UA: NEGATIVE mg/dL
Ketones, ur: NEGATIVE mg/dL
LEUKOCYTES UA: NEGATIVE
NITRITE: NEGATIVE
PROTEIN: NEGATIVE mg/dL
Specific Gravity, Urine: 1.002 — ABNORMAL LOW (ref 1.005–1.030)
Squamous Epithelial / LPF: NONE SEEN
pH: 5 (ref 5.0–8.0)
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
## 2016-08-23 NOTE — ED Notes (Signed)
Leg bag placed on patient and patient instructed on how to empty.  Patient and family verbalized understanding of this information.

## 2016-08-28 ENCOUNTER — Encounter: Payer: Self-pay | Admitting: Physical Medicine & Rehabilitation

## 2016-08-28 ENCOUNTER — Encounter: Payer: Medicare Other | Attending: Physical Medicine & Rehabilitation

## 2016-08-28 ENCOUNTER — Ambulatory Visit (HOSPITAL_BASED_OUTPATIENT_CLINIC_OR_DEPARTMENT_OTHER): Payer: Medicare Other | Admitting: Physical Medicine & Rehabilitation

## 2016-08-28 VITALS — BP 89/55 | HR 81

## 2016-08-28 DIAGNOSIS — I69319 Unspecified symptoms and signs involving cognitive functions following cerebral infarction: Secondary | ICD-10-CM

## 2016-08-28 DIAGNOSIS — Z931 Gastrostomy status: Secondary | ICD-10-CM

## 2016-08-28 DIAGNOSIS — R339 Retention of urine, unspecified: Secondary | ICD-10-CM | POA: Diagnosis not present

## 2016-08-28 DIAGNOSIS — I69391 Dysphagia following cerebral infarction: Secondary | ICD-10-CM

## 2016-08-28 DIAGNOSIS — I63131 Cerebral infarction due to embolism of right carotid artery: Secondary | ICD-10-CM | POA: Insufficient documentation

## 2016-08-28 MED ORDER — GERHARDT'S BUTT CREAM: 1.0000 "application " | TOPICAL_CREAM | Freq: Three times a day (TID) | CUTANEOUS | 0 refills | Status: DC

## 2016-08-28 NOTE — Patient Instructions (Addendum)
Senokot   Dove soap fragrance free
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
## 2016-08-30 ENCOUNTER — Other Ambulatory Visit: Payer: Self-pay | Admitting: Physical Medicine & Rehabilitation

## 2016-08-30 DIAGNOSIS — Z431 Encounter for attention to gastrostomy: Secondary | ICD-10-CM | POA: Diagnosis not present

## 2016-08-30 DIAGNOSIS — I480 Paroxysmal atrial fibrillation: Secondary | ICD-10-CM | POA: Diagnosis not present

## 2016-08-30 DIAGNOSIS — I69391 Dysphagia following cerebral infarction: Secondary | ICD-10-CM | POA: Diagnosis not present

## 2016-08-30 DIAGNOSIS — I639 Cerebral infarction, unspecified: Secondary | ICD-10-CM

## 2016-08-30 DIAGNOSIS — S2241XD Multiple fractures of ribs, right side, subsequent encounter for fracture with routine healing: Secondary | ICD-10-CM | POA: Diagnosis not present

## 2016-08-30 DIAGNOSIS — I130 Hypertensive heart and chronic kidney disease with heart failure and stage 1 through stage 4 chronic kidney disease, or unspecified chronic kidney disease: Secondary | ICD-10-CM | POA: Diagnosis not present

## 2016-08-30 DIAGNOSIS — I5043 Acute on chronic combined systolic (congestive) and diastolic (congestive) heart failure: Secondary | ICD-10-CM | POA: Diagnosis not present

## 2016-08-30 DIAGNOSIS — I69311 Memory deficit following cerebral infarction: Secondary | ICD-10-CM | POA: Diagnosis not present

## 2016-08-30 DIAGNOSIS — S32008D Other fracture of unspecified lumbar vertebra, subsequent encounter for fracture with routine healing: Secondary | ICD-10-CM | POA: Diagnosis not present

## 2016-08-30 DIAGNOSIS — J9621 Acute and chronic respiratory failure with hypoxia: Secondary | ICD-10-CM | POA: Diagnosis not present

## 2016-08-30 DIAGNOSIS — S2242XD Multiple fractures of ribs, left side, subsequent encounter for fracture with routine healing: Secondary | ICD-10-CM | POA: Diagnosis not present

## 2016-08-30 DIAGNOSIS — N183 Chronic kidney disease, stage 3 (moderate): Secondary | ICD-10-CM | POA: Diagnosis not present

## 2016-09-05 ENCOUNTER — Telehealth: Payer: Self-pay | Admitting: Physical Medicine & Rehabilitation

## 2016-09-05 NOTE — Telephone Encounter (Signed)
Called Gregory [name], verbal orders approved and noted on patients chart

## 2016-09-05 NOTE — Telephone Encounter (Signed)
Gregory [name] ST with [facility] needs to get a verbal order to change food intake from finely chopped to mechanical soft.  Her number is [phone].

## 2016-09-06 ENCOUNTER — Telehealth: Payer: Self-pay | Admitting: Vascular Surgery
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
## 2016-09-06 NOTE — Telephone Encounter (Signed)
LVM on home # for NP visit with 2 [place] on 6/22 mailed lttr

## 2016-09-11 DIAGNOSIS — R339 Retention of urine, unspecified: Secondary | ICD-10-CM | POA: Insufficient documentation

## 2016-09-17 ENCOUNTER — Telehealth: Payer: Self-pay | Admitting: *Deleted

## 2016-09-17 NOTE — Telephone Encounter (Signed)
HHPT ended last week but needs further HHPT since he is having trouble getting out.  Calling for Verbal order to continue. Approval given

## 2016-09-18 ENCOUNTER — Telehealth: Payer: Self-pay | Admitting: *Deleted

## 2016-09-18 NOTE — Telephone Encounter (Signed)
Gregory [name] ST called to continue 2wk3 visits and wants to know if she can advance diet to regular from his dysphagia three. Please advise.

## 2016-09-18 NOTE — Telephone Encounter (Signed)
May advance diet to D 3

## 2016-09-19 NOTE — Telephone Encounter (Signed)
He was on D3.  She is asking about adv to regular

## 2016-09-20 NOTE — Telephone Encounter (Signed)
Per Dr Wynn BankerKirsteins may adv to regular diet.

## 2016-09-22 DIAGNOSIS — Z87891 Personal history of nicotine dependence: Secondary | ICD-10-CM | POA: Diagnosis not present

## 2016-09-22 DIAGNOSIS — R040 Epistaxis: Secondary | ICD-10-CM | POA: Diagnosis not present

## 2016-09-22 DIAGNOSIS — Z79899 Other long term (current) drug therapy: Secondary | ICD-10-CM | POA: Diagnosis not present

## 2016-09-22 DIAGNOSIS — I25710 Atherosclerosis of autologous vein coronary artery bypass graft(s) with unstable angina pectoris: Secondary | ICD-10-CM | POA: Diagnosis not present

## 2016-09-22 DIAGNOSIS — I509 Heart failure, unspecified: Secondary | ICD-10-CM | POA: Diagnosis not present

## 2016-09-22 DIAGNOSIS — N183 Chronic kidney disease, stage 3 (moderate): Secondary | ICD-10-CM | POA: Insufficient documentation

## 2016-09-22 NOTE — ED Triage Notes (Signed)
Reports nosebleed off and on all day.

## 2016-09-22 NOTE — ED Notes (Signed)
Clip came off pt's nose and quarter sized clot fell out; assured family; pt awake and alert; moved to sub wait for comfort, put on clean gown; family with pt

## 2016-09-22 NOTE — ED Notes (Signed)
Pt with tampon in left nare, bleeding noted; clip placed while waiting in lobby

## 2016-09-23 ENCOUNTER — Emergency Department
Admission: EM | Admit: 2016-09-23 | Discharge: 2016-09-23 | Disposition: A | Discharge status: Home / Self Care | Payer: Medicare Other | Attending: Student in an Organized Health Care Education/Training Program | Admitting: Student in an Organized Health Care Education/Training Program

## 2016-09-23 DIAGNOSIS — R040 Epistaxis: Secondary | ICD-10-CM

## 2016-09-23 MED ORDER — SILVER NITRATE-POT NITRATE 75-25 % EX MISC
1.0000 | Freq: Once | CUTANEOUS | Status: AC
  Administered 2016-09-23: 1 via TOPICAL
  Filled 2016-09-23: qty 1

## 2016-09-23 MED ORDER — CARBIDOPA-LEVODOPA ER 25-100 MG PO TBCR
2.0000 | EXTENDED_RELEASE_TABLET | Freq: Two times a day (BID) | ORAL | Status: DC
  Administered 2016-09-23: 2 via ORAL

## 2016-09-23 MED ORDER — LIDOCAINE-EPINEPHRINE (PF) 2 %-1:200000 IJ SOLN
20.0000 mL | Freq: Once | INTRAMUSCULAR | Status: DC
  Filled 2016-09-23: qty 20

## 2016-09-23 MED ORDER — LIDOCAINE HCL (PF) 1 % IJ SOLN
INTRAMUSCULAR | Status: AC
  Administered 2016-09-23: 5 mL
  Filled 2016-09-23: qty 5

## 2016-09-23 MED ORDER — OXYMETAZOLINE HCL 0.05 % NA SOLN
1.0000 | Freq: Once | NASAL | Status: AC
  Administered 2016-09-23: 1 via NASAL
  Filled 2016-09-23: qty 15

## 2016-09-23 MED ORDER — CARBIDOPA-LEVODOPA ER 50-200 MG PO TBCR
1.0000 | EXTENDED_RELEASE_TABLET | Freq: Two times a day (BID) | ORAL | Status: DC
  Filled 2016-09-23 (×2): qty 1

## 2016-09-23 MED ORDER — AMOXICILLIN-POT CLAVULANATE 875-125 MG PO TABS: 1.0000 | ORAL_TABLET | Freq: Two times a day (BID) | ORAL | 0 refills | Status: AC

## 2016-09-23 NOTE — ED Notes (Signed)
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
## 2016-09-24 ENCOUNTER — Ambulatory Visit (HOSPITAL_COMMUNITY): Admission: RE | Admit: 2016-09-24 | Payer: Medicare Other | Source: Ambulatory Visit

## 2016-09-27 ENCOUNTER — Encounter (HOSPITAL_COMMUNITY): Payer: Self-pay | Admitting: Interventional Radiology

## 2016-09-27 ENCOUNTER — Ambulatory Visit (HOSPITAL_COMMUNITY)
Admission: RE | Admit: 2016-09-27 | Discharge: 2016-09-27 | Disposition: A | Discharge status: Home / Self Care | Payer: Medicare Other | Source: Ambulatory Visit | Attending: Physical Medicine & Rehabilitation | Admitting: Physical Medicine & Rehabilitation

## 2016-09-27 DIAGNOSIS — Z431 Encounter for attention to gastrostomy: Secondary | ICD-10-CM | POA: Diagnosis not present

## 2016-09-27 DIAGNOSIS — I639 Cerebral infarction, unspecified: Secondary | ICD-10-CM

## 2016-09-27 HISTORY — PX: IR GASTROSTOMY TUBE REMOVAL: IMG5492

## 2016-09-27 MED ORDER — LIDOCAINE VISCOUS 2 % MT SOLN
OROMUCOSAL | Status: DC | PRN
  Administered 2016-09-27: 15 mL via OROMUCOSAL

## 2016-09-27 MED ORDER — LIDOCAINE VISCOUS 2 % MT SOLN
OROMUCOSAL | Status: AC
  Filled 2016-09-27: qty 15

## 2016-10-09 ENCOUNTER — Encounter: Payer: Self-pay | Admitting: Physical Medicine & Rehabilitation

## 2016-10-09 ENCOUNTER — Encounter: Payer: Medicare Other | Attending: Physical Medicine & Rehabilitation

## 2016-10-09 ENCOUNTER — Ambulatory Visit (HOSPITAL_BASED_OUTPATIENT_CLINIC_OR_DEPARTMENT_OTHER): Payer: Medicare Other | Admitting: Physical Medicine & Rehabilitation

## 2016-10-09 VITALS — BP 111/65 | HR 60

## 2016-10-09 DIAGNOSIS — I63131 Cerebral infarction due to embolism of right carotid artery: Secondary | ICD-10-CM | POA: Insufficient documentation

## 2016-10-09 DIAGNOSIS — I69319 Unspecified symptoms and signs involving cognitive functions following cerebral infarction: Secondary | ICD-10-CM

## 2016-10-09 DIAGNOSIS — R269 Unspecified abnormalities of gait and mobility: Secondary | ICD-10-CM

## 2016-10-09 DIAGNOSIS — I69398 Other sequelae of cerebral infarction: Secondary | ICD-10-CM | POA: Insufficient documentation

## 2016-10-09 NOTE — Patient Instructions (Signed)
outpt therapy will contact you
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
## 2016-10-10 ENCOUNTER — Encounter: Payer: Self-pay | Admitting: Neurology

## 2016-10-10 ENCOUNTER — Other Ambulatory Visit: Payer: Self-pay | Admitting: Neurology

## 2016-10-10 ENCOUNTER — Ambulatory Visit (INDEPENDENT_AMBULATORY_CARE_PROVIDER_SITE_OTHER): Payer: Medicare Other | Admitting: Neurology

## 2016-10-10 VITALS — BP 122/68 | HR 78 | Resp 14 | Ht 70.5 in | Wt 185.0 lb

## 2016-10-10 DIAGNOSIS — G20 Parkinson's disease: Secondary | ICD-10-CM | POA: Diagnosis not present

## 2016-10-10 DIAGNOSIS — G20C Parkinsonism, unspecified: Secondary | ICD-10-CM

## 2016-10-10 DIAGNOSIS — I63411 Cerebral infarction due to embolism of right middle cerebral artery: Secondary | ICD-10-CM | POA: Diagnosis not present

## 2016-10-10 DIAGNOSIS — T1490XA Injury, unspecified, initial encounter: Secondary | ICD-10-CM | POA: Diagnosis not present

## 2016-10-10 DIAGNOSIS — R4189 Other symptoms and signs involving cognitive functions and awareness: Secondary | ICD-10-CM | POA: Diagnosis not present

## 2016-10-10 MED ORDER — CARBIDOPA-LEVODOPA ER 50-200 MG PO TBCR: EXTENDED_RELEASE_TABLET | ORAL | 3 refills | Status: DC

## 2016-10-10 NOTE — Patient Instructions (Addendum)
We will increase your Sinemet CR to 1 pill 4 times a day, 9 AM, 1 PM, 5 PM, and 9 PM.  You have come a long way.
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
## 2016-10-11 ENCOUNTER — Other Ambulatory Visit: Payer: Self-pay | Admitting: Cardiology

## 2016-10-11 DIAGNOSIS — I6529 Occlusion and stenosis of unspecified carotid artery: Secondary | ICD-10-CM

## 2016-10-16 ENCOUNTER — Telehealth: Payer: Self-pay | Admitting: *Deleted

## 2016-10-16 DIAGNOSIS — I69319 Unspecified symptoms and signs involving cognitive functions following cerebral infarction: Secondary | ICD-10-CM

## 2016-10-16 NOTE — Telephone Encounter (Signed)
    FW: Referral  [name], Victorino SparrowAndrew E, MD  Sent: Caleen EssexFri Oct 12, 2016 4:48 PM  To: Doreene ElandShumaker, Quantez Schnyder W, RN            Message   Please send speech therapy referral    ----- Message -----  From: Daivd Councilaniel, Debbie L  Sent: 10/11/2016  4:29 PM  To: Erick ColaceAndrew E [name], MD  Subject: Referral                       Patient wife states that he is to be referred for Speech Therapy for Cognitive issues also.  [facility] have the referral nurse [facility]    Thank you,  Debbie C. Daniel   Referral placed.

## 2016-10-17 ENCOUNTER — Other Ambulatory Visit: Payer: Self-pay | Admitting: Cardiology

## 2016-10-17 ENCOUNTER — Inpatient Hospital Stay
Admission: RE | Admit: 2016-10-17 | Discharge: 2016-10-17 | Disposition: A | Discharge status: Home / Self Care | Payer: Self-pay | Source: Ambulatory Visit | Attending: Cardiology | Admitting: Cardiology

## 2016-10-17 ENCOUNTER — Ambulatory Visit
Admission: RE | Admit: 2016-10-17 | Discharge: 2016-10-17 | Disposition: A | Discharge status: Home / Self Care | Payer: Medicare Other | Source: Ambulatory Visit | Attending: Cardiology | Admitting: Cardiology

## 2016-10-17 DIAGNOSIS — I6529 Occlusion and stenosis of unspecified carotid artery: Secondary | ICD-10-CM

## 2016-10-17 MED ORDER — IOPAMIDOL (ISOVUE-370) INJECTION 76%
100.0000 mL | Freq: Once | INTRAVENOUS | Status: AC | PRN
  Administered 2016-10-17: 100 mL via INTRAVENOUS

## 2016-10-18 ENCOUNTER — Other Ambulatory Visit: Payer: Self-pay

## 2016-10-18 ENCOUNTER — Ambulatory Visit: Payer: Medicare Other | Admitting: Neurology

## 2016-10-20 NOTE — Addendum Note (Signed)
Addendum  created 10/20/16 0953 by Dawnielle Christiana, MD   Sign clinical note    

## 2016-10-22 ENCOUNTER — Encounter: Payer: Self-pay | Admitting: Physical Medicine & Rehabilitation

## 2016-10-22 ENCOUNTER — Encounter: Payer: Self-pay | Admitting: Neurology

## 2016-10-23 ENCOUNTER — Other Ambulatory Visit: Payer: Self-pay | Admitting: Vascular Surgery

## 2016-10-23 DIAGNOSIS — I714 Abdominal aortic aneurysm, without rupture, unspecified: Secondary | ICD-10-CM

## 2016-10-23 DIAGNOSIS — I6529 Occlusion and stenosis of unspecified carotid artery: Secondary | ICD-10-CM

## 2016-10-24 ENCOUNTER — Ambulatory Visit: Payer: Medicare Other | Admitting: Occupational Therapy

## 2016-10-24 ENCOUNTER — Ambulatory Visit: Payer: Medicare Other | Attending: Physical Medicine & Rehabilitation

## 2016-10-24 ENCOUNTER — Encounter: Payer: Self-pay | Admitting: Occupational Therapy

## 2016-10-24 DIAGNOSIS — R2689 Other abnormalities of gait and mobility: Secondary | ICD-10-CM | POA: Diagnosis present

## 2016-10-24 DIAGNOSIS — M6281 Muscle weakness (generalized): Secondary | ICD-10-CM

## 2016-10-24 DIAGNOSIS — R278 Other lack of coordination: Secondary | ICD-10-CM

## 2016-10-24 DIAGNOSIS — R41841 Cognitive communication deficit: Secondary | ICD-10-CM | POA: Insufficient documentation
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
## 2016-10-29 ENCOUNTER — Encounter: Payer: Self-pay | Admitting: Vascular Surgery

## 2016-10-30 ENCOUNTER — Telehealth: Payer: Self-pay | Admitting: Vascular Surgery

## 2016-10-30 ENCOUNTER — Ambulatory Visit: Payer: Medicare Other | Admitting: Speech Pathology

## 2016-10-30 ENCOUNTER — Ambulatory Visit: Payer: Medicare Other

## 2016-10-30 ENCOUNTER — Ambulatory Visit: Payer: Medicare Other | Admitting: Occupational Therapy

## 2016-10-30 DIAGNOSIS — R278 Other lack of coordination: Secondary | ICD-10-CM

## 2016-10-30 DIAGNOSIS — M6281 Muscle weakness (generalized): Secondary | ICD-10-CM

## 2016-10-30 DIAGNOSIS — R41841 Cognitive communication deficit: Secondary | ICD-10-CM

## 2016-10-30 DIAGNOSIS — R2689 Other abnormalities of gait and mobility: Secondary | ICD-10-CM | POA: Diagnosis not present
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
## 2016-10-30 NOTE — Telephone Encounter (Signed)
Pt only had carotid u/s by Dr. Jacinto HalimGanji. Canceled only the carotid study. Had report faxed over. Spoke to pt's wife, resched appt to 10/31/16 w/ [facility].
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
## 2016-10-30 NOTE — Telephone Encounter (Signed)
-----   Message from Phillips Odorarol S Pullins, RN sent at 10/30/2016 12:41 PM EDT ----- Regarding: cancel ONLY the vasc. labs on 6/22; can we obtain the studies from Dr. Jacinto HalimGanji? Appt. 6/22 with [facility]  ----- Message ----- From: Maeola Harmanain, Brandon Christopher, MD Sent: 10/30/2016  12:30 PM To: Vvs Charge Pool  Emelia LoronRichard Dieter [id number] [birth year]  This patient has recently had US of his aaa and carotids with Dr. Jacinto HalimGanji although they are not in our records. I will see him as scheduled next week but he does not need the studies to be repeated.   brandon

## 2016-10-31 ENCOUNTER — Ambulatory Visit (HOSPITAL_COMMUNITY)
Admission: RE | Admit: 2016-10-31 | Discharge: 2016-10-31 | Disposition: A | Discharge status: Home / Self Care | Payer: Medicare Other | Source: Ambulatory Visit | Attending: Vascular Surgery | Admitting: Vascular Surgery

## 2016-10-31 ENCOUNTER — Encounter: Payer: Self-pay | Admitting: Vascular Surgery

## 2016-10-31 ENCOUNTER — Encounter: Payer: Self-pay | Admitting: Speech Pathology

## 2016-10-31 ENCOUNTER — Ambulatory Visit (INDEPENDENT_AMBULATORY_CARE_PROVIDER_SITE_OTHER): Payer: Medicare Other | Admitting: Vascular Surgery

## 2016-10-31 VITALS — BP 111/72 | HR 61 | Temp 97.6°F | Resp 20 | Ht 70.5 in | Wt 181.0 lb

## 2016-10-31 DIAGNOSIS — Z01818 Encounter for other preprocedural examination: Secondary | ICD-10-CM

## 2016-10-31 DIAGNOSIS — I6529 Occlusion and stenosis of unspecified carotid artery: Secondary | ICD-10-CM | POA: Diagnosis not present

## 2016-10-31 DIAGNOSIS — I6521 Occlusion and stenosis of right carotid artery: Secondary | ICD-10-CM | POA: Diagnosis not present

## 2016-10-31 DIAGNOSIS — I714 Abdominal aortic aneurysm, without rupture, unspecified: Secondary | ICD-10-CM

## 2016-10-31 NOTE — Addendum Note (Signed)
Addended by: Burton ApleyPETTY, Shara Hartis A on: 10/31/2016 12:53 PM   Modules accepted: Orders
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
## 2016-11-01 ENCOUNTER — Ambulatory Visit: Payer: Medicare Other | Admitting: Occupational Therapy

## 2016-11-01 ENCOUNTER — Encounter: Payer: Self-pay | Admitting: Occupational Therapy

## 2016-11-01 ENCOUNTER — Ambulatory Visit: Payer: Medicare Other

## 2016-11-01 ENCOUNTER — Ambulatory Visit: Payer: Medicare Other | Admitting: Speech Pathology

## 2016-11-01 DIAGNOSIS — R278 Other lack of coordination: Secondary | ICD-10-CM

## 2016-11-01 DIAGNOSIS — R2689 Other abnormalities of gait and mobility: Secondary | ICD-10-CM

## 2016-11-01 DIAGNOSIS — M6281 Muscle weakness (generalized): Secondary | ICD-10-CM

## 2016-11-01 DIAGNOSIS — R41841 Cognitive communication deficit: Secondary | ICD-10-CM
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
## 2016-11-02 ENCOUNTER — Encounter: Payer: Self-pay | Admitting: Speech Pathology
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
## 2016-11-05 ENCOUNTER — Ambulatory Visit: Payer: Medicare Other | Admitting: Occupational Therapy

## 2016-11-05 ENCOUNTER — Ambulatory Visit: Payer: Medicare Other | Admitting: Speech Pathology

## 2016-11-05 ENCOUNTER — Ambulatory Visit: Payer: Medicare Other

## 2016-11-05 DIAGNOSIS — R278 Other lack of coordination: Secondary | ICD-10-CM

## 2016-11-05 DIAGNOSIS — M6281 Muscle weakness (generalized): Secondary | ICD-10-CM

## 2016-11-05 DIAGNOSIS — R41841 Cognitive communication deficit: Secondary | ICD-10-CM

## 2016-11-05 DIAGNOSIS — R2689 Other abnormalities of gait and mobility: Secondary | ICD-10-CM | POA: Diagnosis not present
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
## 2016-11-06 ENCOUNTER — Encounter: Payer: Self-pay | Admitting: Speech Pathology
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
## 2016-11-07 ENCOUNTER — Encounter: Payer: Self-pay | Admitting: Cardiology

## 2016-11-08 ENCOUNTER — Ambulatory Visit: Payer: Medicare Other | Admitting: Occupational Therapy

## 2016-11-08 ENCOUNTER — Ambulatory Visit: Payer: Medicare Other | Admitting: Speech Pathology

## 2016-11-08 ENCOUNTER — Ambulatory Visit: Payer: Medicare Other

## 2016-11-08 ENCOUNTER — Encounter: Payer: Self-pay | Admitting: Speech Pathology

## 2016-11-08 DIAGNOSIS — R41841 Cognitive communication deficit: Secondary | ICD-10-CM

## 2016-11-08 DIAGNOSIS — R2689 Other abnormalities of gait and mobility: Secondary | ICD-10-CM

## 2016-11-08 DIAGNOSIS — M6281 Muscle weakness (generalized): Secondary | ICD-10-CM

## 2016-11-08 DIAGNOSIS — R278 Other lack of coordination: Secondary | ICD-10-CM
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
## 2016-11-09 ENCOUNTER — Ambulatory Visit (HOSPITAL_COMMUNITY): Payer: Medicare Other

## 2016-11-09 ENCOUNTER — Encounter: Payer: Medicare Other | Admitting: Vascular Surgery

## 2016-11-09 ENCOUNTER — Other Ambulatory Visit (HOSPITAL_COMMUNITY): Payer: Medicare Other

## 2016-11-12 ENCOUNTER — Encounter: Payer: Medicare Other | Admitting: Speech Pathology

## 2016-11-12 ENCOUNTER — Encounter: Payer: Medicare Other | Admitting: Occupational Therapy

## 2016-11-12 ENCOUNTER — Ambulatory Visit: Payer: Medicare Other

## 2016-11-14 ENCOUNTER — Ambulatory Visit: Payer: Medicare Other | Admitting: Occupational Therapy

## 2016-11-14 ENCOUNTER — Encounter: Payer: Self-pay | Admitting: Occupational Therapy

## 2016-11-14 ENCOUNTER — Ambulatory Visit: Payer: Medicare Other

## 2016-11-14 ENCOUNTER — Encounter: Payer: Medicare Other | Admitting: Speech Pathology

## 2016-11-14 DIAGNOSIS — R278 Other lack of coordination: Secondary | ICD-10-CM

## 2016-11-14 DIAGNOSIS — M6281 Muscle weakness (generalized): Secondary | ICD-10-CM

## 2016-11-14 DIAGNOSIS — R2689 Other abnormalities of gait and mobility: Secondary | ICD-10-CM
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
## 2016-11-19 ENCOUNTER — Encounter: Payer: Self-pay | Admitting: Vascular Surgery

## 2016-11-19 ENCOUNTER — Encounter: Payer: Medicare Other | Admitting: Speech Pathology

## 2016-11-19 ENCOUNTER — Ambulatory Visit: Payer: Medicare Other

## 2016-11-19 ENCOUNTER — Ambulatory Visit: Payer: Medicare Other | Attending: Physical Medicine & Rehabilitation | Admitting: Occupational Therapy

## 2016-11-19 ENCOUNTER — Encounter: Payer: Medicare Other | Admitting: Occupational Therapy

## 2016-11-19 DIAGNOSIS — R278 Other lack of coordination: Secondary | ICD-10-CM | POA: Insufficient documentation

## 2016-11-19 DIAGNOSIS — M6281 Muscle weakness (generalized): Secondary | ICD-10-CM | POA: Insufficient documentation

## 2016-11-19 DIAGNOSIS — R2689 Other abnormalities of gait and mobility: Secondary | ICD-10-CM | POA: Diagnosis present

## 2016-11-19 DIAGNOSIS — R41841 Cognitive communication deficit: Secondary | ICD-10-CM | POA: Insufficient documentation
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
## 2016-11-22 ENCOUNTER — Encounter: Payer: Medicare Other | Admitting: Speech Pathology

## 2016-11-22 ENCOUNTER — Ambulatory Visit: Payer: Medicare Other

## 2016-11-22 ENCOUNTER — Encounter: Payer: Medicare Other | Admitting: Occupational Therapy

## 2016-11-26 ENCOUNTER — Ambulatory Visit: Payer: Medicare Other | Admitting: Physical Medicine & Rehabilitation

## 2016-11-27 ENCOUNTER — Encounter: Payer: Medicare Other | Admitting: Occupational Therapy

## 2016-11-27 ENCOUNTER — Encounter: Payer: Medicare Other | Admitting: Speech Pathology

## 2016-11-27 ENCOUNTER — Ambulatory Visit: Payer: Medicare Other | Admitting: Occupational Therapy

## 2016-11-27 ENCOUNTER — Ambulatory Visit: Payer: Medicare Other

## 2016-11-27 DIAGNOSIS — M6281 Muscle weakness (generalized): Secondary | ICD-10-CM

## 2016-11-27 DIAGNOSIS — R2689 Other abnormalities of gait and mobility: Secondary | ICD-10-CM

## 2016-11-27 DIAGNOSIS — R278 Other lack of coordination: Secondary | ICD-10-CM
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
## 2016-11-29 ENCOUNTER — Encounter: Payer: Self-pay | Admitting: Vascular Surgery

## 2016-11-29 ENCOUNTER — Ambulatory Visit: Payer: Medicare Other | Admitting: Occupational Therapy

## 2016-11-29 ENCOUNTER — Encounter: Payer: Medicare Other | Admitting: Speech Pathology

## 2016-11-29 ENCOUNTER — Ambulatory Visit
Admission: RE | Admit: 2016-11-29 | Discharge: 2016-11-29 | Disposition: A | Discharge status: Home / Self Care | Payer: Medicare Other | Source: Ambulatory Visit | Attending: Vascular Surgery | Admitting: Vascular Surgery

## 2016-11-29 ENCOUNTER — Ambulatory Visit: Payer: Medicare Other

## 2016-11-29 ENCOUNTER — Ambulatory Visit (INDEPENDENT_AMBULATORY_CARE_PROVIDER_SITE_OTHER): Payer: Medicare Other | Admitting: Vascular Surgery

## 2016-11-29 VITALS — BP 115/69 | HR 47 | Temp 97.5°F | Resp 20 | Ht 70.5 in | Wt 178.4 lb

## 2016-11-29 DIAGNOSIS — Z01818 Encounter for other preprocedural examination: Secondary | ICD-10-CM

## 2016-11-29 DIAGNOSIS — I6521 Occlusion and stenosis of right carotid artery: Secondary | ICD-10-CM

## 2016-11-29 MED ORDER — IOPAMIDOL (ISOVUE-370) INJECTION 76%
75.0000 mL | Freq: Once | INTRAVENOUS | Status: AC | PRN
  Administered 2016-11-29: 75 mL via INTRAVENOUS
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
## 2016-12-03 ENCOUNTER — Ambulatory Visit: Payer: Medicare Other | Admitting: Speech Pathology

## 2016-12-03 ENCOUNTER — Ambulatory Visit: Payer: Medicare Other

## 2016-12-03 ENCOUNTER — Encounter: Payer: Medicare Other | Admitting: Speech Pathology

## 2016-12-03 ENCOUNTER — Encounter: Payer: Medicare Other | Admitting: Occupational Therapy

## 2016-12-03 ENCOUNTER — Encounter: Payer: Self-pay | Admitting: Speech Pathology

## 2016-12-03 DIAGNOSIS — R2689 Other abnormalities of gait and mobility: Secondary | ICD-10-CM

## 2016-12-03 DIAGNOSIS — M6281 Muscle weakness (generalized): Secondary | ICD-10-CM | POA: Diagnosis not present

## 2016-12-03 DIAGNOSIS — R278 Other lack of coordination: Secondary | ICD-10-CM

## 2016-12-03 DIAGNOSIS — R41841 Cognitive communication deficit: Secondary | ICD-10-CM
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
## 2016-12-05 ENCOUNTER — Encounter: Payer: Self-pay | Admitting: Speech Pathology

## 2016-12-05 ENCOUNTER — Ambulatory Visit: Payer: Medicare Other | Admitting: Occupational Therapy

## 2016-12-05 ENCOUNTER — Ambulatory Visit: Payer: Medicare Other | Admitting: Speech Pathology

## 2016-12-05 ENCOUNTER — Ambulatory Visit: Payer: Medicare Other

## 2016-12-05 ENCOUNTER — Encounter: Payer: Medicare Other | Admitting: Speech Pathology

## 2016-12-05 ENCOUNTER — Other Ambulatory Visit: Payer: Self-pay

## 2016-12-05 DIAGNOSIS — R41841 Cognitive communication deficit: Secondary | ICD-10-CM

## 2016-12-05 DIAGNOSIS — M6281 Muscle weakness (generalized): Secondary | ICD-10-CM | POA: Diagnosis not present

## 2016-12-05 DIAGNOSIS — R2689 Other abnormalities of gait and mobility: Secondary | ICD-10-CM

## 2016-12-05 DIAGNOSIS — R278 Other lack of coordination: Secondary | ICD-10-CM
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
## 2016-12-06 NOTE — Pre-Procedure Instructions (Signed)
Deontez Klinke  12/06/2016      [facility] [address] - [place], [place] - [address] [facility] [address] [place] [address] Phone: [phone] Fax: [phone]  [facility] [phone] [facility] - [place], [place] - [address] [facility] [address] [facility] [address] [place] [address] Phone: [phone] Fax: [phone]    Your procedure is scheduled on  Wednesday, July 25th    Report to [facility] Admitting at 5:30 AM             (posted surgery time 7:30 am - 10:24 am)   Call this number if you have problems the morning of surgery:  [phone], for other questions [phone] Mon-Fri from 8am-4pm)    Remember:   Do not eat food or drink liquids after midnight, Tuesday.              4-5 days prior to surgery, STOP TAKING any vitamins, herbal supplements, anti-inflammatories   Take these medicines the morning of surgery with A SIP OF WATER :  Carbidopa-Levodopa, Protonix, Flonase   Do not wear jewelry - no rings or watches.  Do not wear lotions, colognes or deoderant.             Men may shave face and neck.   Do not bring valuables to the hospital.  [facility] is not responsible for any belongings or valuables.  Contacts, dentures or bridgework may not be worn into surgery.  Leave your suitcase in the car.  After surgery it may be brought to your room.  For patients admitted to the hospital, discharge time will be determined by your treatment team.  Please read over the following fact sheets that you were given. Pain Booklet, MRSA Information and Surgical Site Infection Prevention         [facility]- Preparing For Surgery  Before surgery, you can play an important role. Because skin is not sterile, your skin needs to be as free of germs as possible. You can reduce the number of germs on your skin by washing with CHG (chlorahexidine gluconate) Soap before surgery.  CHG is an antiseptic cleaner which kills  germs and bonds with the skin to continue killing germs even after washing.  Please do not use if you have an allergy to CHG or antibacterial soaps. If your skin becomes reddened/irritated stop using the CHG.  Do not shave (including legs and underarms) for at least 48 hours prior to first CHG shower. It is OK to shave your face.  Please follow these instructions carefully.   1. Shower the NIGHT BEFORE SURGERY and the MORNING OF SURGERY with CHG.   2. If you chose to wash your hair, wash your hair first as usual with your normal shampoo.  3. After you shampoo, rinse your hair and body thoroughly to remove the shampoo.  4. Use CHG as you would any other liquid soap. You can apply CHG directly to the skin and wash gently with a scrungie or a clean washcloth.   5. Apply the CHG Soap to your body ONLY FROM THE NECK DOWN.  Do not use on open wounds or open sores. Avoid contact with your eyes, ears, mouth and genitals (private parts). Wash genitals (private parts) with your normal soap.  6. Wash thoroughly, paying special attention to the area where your surgery will be performed.  7. Thoroughly rinse your body with warm water from the neck down.  8. DO  NOT shower/wash with your normal soap after using and rinsing off the CHG Soap.  9. Pat yourself dry with a CLEAN TOWEL.   10. Wear CLEAN PAJAMAS   11. Place CLEAN SHEETS on your bed the night of your first shower and DO NOT SLEEP WITH PETS.    Day of Surgery: Do not apply any deodorants/lotions. Please wear clean clothes to the hospital/surgery center.

## 2016-12-07 ENCOUNTER — Other Ambulatory Visit: Payer: Self-pay

## 2016-12-07 ENCOUNTER — Encounter: Payer: Medicare Other | Attending: Physical Medicine & Rehabilitation

## 2016-12-07 ENCOUNTER — Encounter: Payer: Self-pay | Admitting: Physical Medicine & Rehabilitation

## 2016-12-07 ENCOUNTER — Encounter (HOSPITAL_COMMUNITY)
Admission: RE | Admit: 2016-12-07 | Discharge: 2016-12-07 | Disposition: A | Discharge status: Home / Self Care | Payer: Medicare Other | Source: Ambulatory Visit | Attending: Vascular Surgery | Admitting: Vascular Surgery

## 2016-12-07 ENCOUNTER — Encounter (HOSPITAL_COMMUNITY): Payer: Self-pay

## 2016-12-07 ENCOUNTER — Ambulatory Visit (HOSPITAL_BASED_OUTPATIENT_CLINIC_OR_DEPARTMENT_OTHER): Payer: Medicare Other | Admitting: Physical Medicine & Rehabilitation

## 2016-12-07 VITALS — BP 104/67 | HR 56 | Wt 175.0 lb

## 2016-12-07 DIAGNOSIS — I69319 Unspecified symptoms and signs involving cognitive functions following cerebral infarction: Secondary | ICD-10-CM | POA: Diagnosis not present

## 2016-12-07 DIAGNOSIS — Z7951 Long term (current) use of inhaled steroids: Secondary | ICD-10-CM | POA: Insufficient documentation

## 2016-12-07 DIAGNOSIS — I4891 Unspecified atrial fibrillation: Secondary | ICD-10-CM | POA: Insufficient documentation

## 2016-12-07 DIAGNOSIS — K760 Fatty (change of) liver, not elsewhere classified: Secondary | ICD-10-CM | POA: Insufficient documentation

## 2016-12-07 DIAGNOSIS — Z951 Presence of aortocoronary bypass graft: Secondary | ICD-10-CM | POA: Diagnosis not present

## 2016-12-07 DIAGNOSIS — N400 Enlarged prostate without lower urinary tract symptoms: Secondary | ICD-10-CM | POA: Diagnosis not present

## 2016-12-07 DIAGNOSIS — E785 Hyperlipidemia, unspecified: Secondary | ICD-10-CM | POA: Diagnosis not present

## 2016-12-07 DIAGNOSIS — G20 Parkinson's disease: Secondary | ICD-10-CM | POA: Insufficient documentation

## 2016-12-07 DIAGNOSIS — I2510 Atherosclerotic heart disease of native coronary artery without angina pectoris: Secondary | ICD-10-CM | POA: Insufficient documentation

## 2016-12-07 DIAGNOSIS — Z8673 Personal history of transient ischemic attack (TIA), and cerebral infarction without residual deficits: Secondary | ICD-10-CM | POA: Diagnosis not present

## 2016-12-07 DIAGNOSIS — Z0183 Encounter for blood typing: Secondary | ICD-10-CM | POA: Diagnosis not present

## 2016-12-07 DIAGNOSIS — Z01818 Encounter for other preprocedural examination: Secondary | ICD-10-CM | POA: Diagnosis present

## 2016-12-07 DIAGNOSIS — I6521 Occlusion and stenosis of right carotid artery: Secondary | ICD-10-CM | POA: Insufficient documentation

## 2016-12-07 DIAGNOSIS — R161 Splenomegaly, not elsewhere classified: Secondary | ICD-10-CM | POA: Diagnosis not present

## 2016-12-07 DIAGNOSIS — I69398 Other sequelae of cerebral infarction: Secondary | ICD-10-CM | POA: Diagnosis not present

## 2016-12-07 DIAGNOSIS — K219 Gastro-esophageal reflux disease without esophagitis: Secondary | ICD-10-CM | POA: Insufficient documentation

## 2016-12-07 DIAGNOSIS — Z87891 Personal history of nicotine dependence: Secondary | ICD-10-CM | POA: Insufficient documentation

## 2016-12-07 DIAGNOSIS — Z7982 Long term (current) use of aspirin: Secondary | ICD-10-CM | POA: Insufficient documentation

## 2016-12-07 DIAGNOSIS — I714 Abdominal aortic aneurysm, without rupture: Secondary | ICD-10-CM | POA: Diagnosis not present

## 2016-12-07 DIAGNOSIS — Z79899 Other long term (current) drug therapy: Secondary | ICD-10-CM | POA: Insufficient documentation

## 2016-12-07 DIAGNOSIS — I63131 Cerebral infarction due to embolism of right carotid artery: Secondary | ICD-10-CM | POA: Insufficient documentation

## 2016-12-07 DIAGNOSIS — Z01812 Encounter for preprocedural laboratory examination: Secondary | ICD-10-CM | POA: Diagnosis not present

## 2016-12-07 DIAGNOSIS — R269 Unspecified abnormalities of gait and mobility: Secondary | ICD-10-CM | POA: Diagnosis not present

## 2016-12-07 HISTORY — DX: Unspecified osteoarthritis, unspecified site: M19.90

## 2016-12-07 LAB — URINALYSIS, ROUTINE W REFLEX MICROSCOPIC
Bilirubin Urine: NEGATIVE
Glucose, UA: NEGATIVE mg/dL
KETONES UR: NEGATIVE mg/dL
Nitrite: POSITIVE — AB
PROTEIN: 100 mg/dL — AB
SQUAMOUS EPITHELIAL / LPF: NONE SEEN
Specific Gravity, Urine: 1.015 (ref 1.005–1.030)
pH: 5 (ref 5.0–8.0)

## 2016-12-07 LAB — TYPE AND SCREEN
ABO/RH(D): O POS
ANTIBODY SCREEN: NEGATIVE

## 2016-12-07 LAB — PROTIME-INR
INR: 1.14
PROTHROMBIN TIME: 14.7 s (ref 11.4–15.2)

## 2016-12-07 LAB — CBC
HEMATOCRIT: 39.2 % (ref 39.0–52.0)
Hemoglobin: 12.5 g/dL — ABNORMAL LOW (ref 13.0–17.0)
MCH: 27.4 pg (ref 26.0–34.0)
MCHC: 31.9 g/dL (ref 30.0–36.0)
MCV: 86 fL (ref 78.0–100.0)
Platelets: 271 10*3/uL (ref 150–400)
RBC: 4.56 MIL/uL (ref 4.22–5.81)
RDW: 14.9 % (ref 11.5–15.5)
WBC: 6.1 10*3/uL (ref 4.0–10.5)

## 2016-12-07 LAB — COMPREHENSIVE METABOLIC PANEL
ALBUMIN: 3.9 g/dL (ref 3.5–5.0)
ALT: <5 U/L — ABNORMAL LOW (ref 17–63)
ANION GAP: 9 (ref 5–15)
AST: 15 U/L (ref 15–41)
Alkaline Phosphatase: 90 U/L (ref 38–126)
BILIRUBIN TOTAL: 1.2 mg/dL (ref 0.3–1.2)
BUN: 10 mg/dL (ref 6–20)
CO2: 30 mmol/L (ref 22–32)
Calcium: 9.5 mg/dL (ref 8.9–10.3)
Chloride: 101 mmol/L (ref 101–111)
Creatinine, Ser: 1.26 mg/dL — ABNORMAL HIGH (ref 0.61–1.24)
GFR calc Af Amer: >60 mL/min (ref >60)
GFR calc non Af Amer: 54 mL/min — ABNORMAL LOW (ref >60)
GLUCOSE: 99 mg/dL (ref 65–99)
POTASSIUM: 3.7 mmol/L (ref 3.5–5.1)
Sodium: 140 mmol/L (ref 135–145)
TOTAL PROTEIN: 7.5 g/dL (ref 6.5–8.1)

## 2016-12-07 LAB — APTT: APTT: 34 s (ref 24–36)

## 2016-12-07 LAB — SURGICAL PCR SCREEN
MRSA, PCR: POSITIVE — AB
Staphylococcus aureus: POSITIVE — AB

## 2016-12-07 NOTE — Progress Notes (Signed)
Subjective:    Patient ID: Gregory [name], male    DOB: 10/29/1940, 76 y.o.   MRN: [id number]  HPI Carotid endarterectomy 7/25 Amb distance 600' Some increased confusionAt night and first thing in a.m. We discussed that postoperatively they can expect some worsening of cognitive status due to surgery as well as disruption of sleep pattern and pain medications.  Also discussed that physical therapy may be resumed postoperatively and will not require a separate prescription if the patient resumes within 1 month  BM pattern normally once a day, had episode of  No change in diet    Pain Inventory Average Pain 0 Pain Right Now 0 My pain is na  In the last 24 hours, has pain interfered with the following? General activity 0 Relation with others 0 Enjoyment of life 0 What TIME of day is your pain at its worst? na Sleep (in general) na  Pain is worse with: na Pain improves with: na Relief from Meds: na  Mobility walk with assistance use a walker  Function retired  Neuro/Psych weakness numbness trouble walking  Prior Studies Any changes since last visit?  no  Physicians involved in your care Any changes since last visit?  no   Family History  Problem Relation Age of Onset  . Heart disease Father        died from  . Congestive Heart Failure Father   . Kidney disease Mother   . Stroke Brother    Social History   Social History  . Marital status: Married    Spouse name: N/A  . Number of children: 6  . Years of education: Collge   Occupational History  . retired    Social History Main Topics  . Smoking status: Former Smoker    Quit date: 08/29/1987  . Smokeless tobacco: Never Used  . Alcohol use No  . Drug use: No  . Sexual activity: Yes   Other Topics Concern  . Not on file   Social History Narrative   ** Merged History Encounter **       Drinks 1-2 cups of coffee a day    Past Surgical History:  Procedure Laterality Date  . COLONOSCOPY   2004  . CORONARY ARTERY BYPASS GRAFT    . CORONARY ARTERY BYPASS GRAFT N/A 09/16/2012   Procedure: CORONARY ARTERY BYPASS GRAFTING (CABG) TIMES ONE USING LEFT INTERNAL MAMMARY ARTERY;  Surgeon: [name], MD;  Location: [facility] OR;  Service: Open Heart Surgery;  Laterality: N/A;  . EYE SURGERY  2012   left cataract extraction  . INNER EAR SURGERY  1995   Dr. Soyla Murphy...placement of shunt  . IR GASTROSTOMY TUBE REMOVAL  09/27/2016  . IR GENERIC HISTORICAL  07/25/2016   IR GASTROSTOMY TUBE MOD SED 07/25/2016 [name], MD [facility]  . LEFT HEART CATHETERIZATION WITH CORONARY ANGIOGRAM N/A 08/19/2012   Procedure: LEFT HEART CATHETERIZATION WITH CORONARY ANGIOGRAM;  Surgeon: [name], MD;  Location: [facility] CATH LAB;  Service: Cardiovascular;  Laterality: N/A;  . TRACHEOSTOMY TUBE PLACEMENT N/A 07/11/2016   Procedure: TRACHEOSTOMY;  Surgeon: [name], MD;  Location: [facility] OR;  Service: ENT;  Laterality: N/A;   Past Medical History:  Diagnosis Date  . AAA (abdominal aortic aneurysm) (HCC)   . Abnormal stress test 08/08/12  . AF (atrial fibrillation) (HCC) 2014  . Atrial fibrillation (HCC)   . Bilateral carotid artery stenosis 08/14/12   moderate bilaterally per carotid duplex  . CAD (coronary artery disease)   .  Coronary artery disease   . Deformed pylorus, acquired   . Erythema of esophagus   . Esophageal stricture   . Fatty infiltration of liver   . Fatty liver   . GERD (gastroesophageal reflux disease)   . History of esophageal stricture   . Hyperlipidemia   . Parkinsonism (HCC)   . S/P cardiac cath 08/12/12  . Splenomegaly    There were no vitals taken for this visit.  Opioid Risk Score:   Fall Risk Score:  `1  Depression screen PHQ 2/9  Depression screen PHQ 2/9 08/28/2016  Decreased Interest 0  Down, Depressed, Hopeless 0  PHQ - 2 Score 0  Altered sleeping 0  Tired, decreased energy 1  Change in appetite 0  Feeling bad or failure about yourself  0  Trouble  concentrating 0  Moving slowly or fidgety/restless 0  Suicidal thoughts 0  PHQ-9 Score 1  Difficult doing work/chores Not difficult at all     Review of Systems  Constitutional: Negative.   HENT: Negative.   Eyes: Negative.   Respiratory: Negative.   Cardiovascular: Negative.   Gastrointestinal: Negative.   Endocrine: Negative.   Genitourinary: Negative.   Musculoskeletal: Negative.   Skin: Negative.   Allergic/Immunologic: Negative.   Neurological: Negative.   Hematological: Negative.   Psychiatric/Behavioral: Negative.   All other systems reviewed and are negative.      Objective:   Physical Exam  Constitutional: He is oriented to person, place, and time. He appears well-developed and well-nourished.  HENT:  Head: Normocephalic and atraumatic.  Eyes: Pupils are equal, round, and reactive to light. Conjunctivae and EOM are normal.  Neurological: He is alert and oriented to person, place, and time.  Psychiatric: He has a normal mood and affect.  Nursing note and vitals reviewed.   Oriented to person and place not time Motor strength is 5 minus bilateral deltoid, bicep, tricep, grip, hip flexor, knee extensor, ankle dorsiflexor. Mild bradycardia kinesia. Response latency is mildly delayed     Assessment & Plan:  1. Right PLIC and right thalamic infarcts, balance is improving. Ambulation distance is improving. Also has periventricular white matter infarcts on the right as well as high parietal infarcts on the right, which are likely causing cognitive problems  Discussed with patient and his wife and his son that cognitive improvement may proceed over a slower timeframe, i.e., up to a year post stroke  He is likely to experience some worsening of cognition. Postoperatively, due to factors listed above.  Follow-up with me one month, reassess his functional status, resume PT at that time . Outpatient versus home health  Long discussion with patient, his wife and son.  Multiple topics including diet. Bowels as well as therapy and cognition and sleep  Over half of the 25 min visit was spent counseling and coordinating care.

## 2016-12-07 NOTE — Progress Notes (Signed)
Mupirocin Ointment Rx called into [facility] on [address] [place] for positive PCR of MRSA and Staph. Spoke with pt's wife and informed her of results and need to pick up Rx. She voiced understanding.
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
## 2016-12-10 ENCOUNTER — Encounter: Payer: Medicare Other | Admitting: Speech Pathology

## 2016-12-10 ENCOUNTER — Other Ambulatory Visit: Payer: Self-pay

## 2016-12-10 ENCOUNTER — Ambulatory Visit: Payer: Medicare Other | Admitting: Speech Pathology

## 2016-12-10 ENCOUNTER — Ambulatory Visit: Payer: Medicare Other

## 2016-12-10 ENCOUNTER — Encounter: Payer: Medicare Other | Admitting: Occupational Therapy

## 2016-12-10 ENCOUNTER — Telehealth: Payer: Self-pay

## 2016-12-10 ENCOUNTER — Ambulatory Visit: Payer: Medicare Other | Admitting: Occupational Therapy

## 2016-12-10 ENCOUNTER — Encounter: Payer: Self-pay | Admitting: Speech Pathology

## 2016-12-10 DIAGNOSIS — M6281 Muscle weakness (generalized): Secondary | ICD-10-CM

## 2016-12-10 DIAGNOSIS — R2689 Other abnormalities of gait and mobility: Secondary | ICD-10-CM

## 2016-12-10 DIAGNOSIS — T83511A Infection and inflammatory reaction due to indwelling urethral catheter, initial encounter: Secondary | ICD-10-CM

## 2016-12-10 DIAGNOSIS — R41841 Cognitive communication deficit: Secondary | ICD-10-CM

## 2016-12-10 DIAGNOSIS — R278 Other lack of coordination: Secondary | ICD-10-CM

## 2016-12-10 DIAGNOSIS — N390 Urinary tract infection, site not specified: Secondary | ICD-10-CM

## 2016-12-10 MED ORDER — CIPROFLOXACIN HCL 500 MG PO TABS: 500.0000 mg | ORAL_TABLET | Freq: Two times a day (BID) | ORAL | 0 refills | Status: DC
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
## 2016-12-10 NOTE — Telephone Encounter (Signed)
rec'd call from PA from anesthesia re: abnormal pre-op urine.  Advised will order Cipro 500 mg BID x 14 days, per standing order.  Phone call to pt's wife.  Advised of UTI, and of antibiotic being ordered.  Advised that he should start the antibiotic today, and to drink full glass of H2O with each dose.  Will send Rx for abx. to pt's pharmacy.  Verb. Understanding.  Will make Dr. Darrick PennaFields aware.
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
## 2016-12-12 ENCOUNTER — Encounter: Payer: Medicare Other | Admitting: Occupational Therapy

## 2016-12-12 ENCOUNTER — Ambulatory Visit: Payer: Medicare Other | Admitting: Speech Pathology

## 2016-12-12 ENCOUNTER — Inpatient Hospital Stay (HOSPITAL_COMMUNITY): Payer: Medicare Other | Admitting: Vascular Surgery

## 2016-12-12 ENCOUNTER — Inpatient Hospital Stay (HOSPITAL_COMMUNITY)
Admission: RE | Admit: 2016-12-12 | Discharge: 2016-12-14 | DRG: 038 | Disposition: A | Discharge status: Home / Self Care | Payer: Medicare Other | Source: Ambulatory Visit | Attending: Vascular Surgery | Admitting: Vascular Surgery

## 2016-12-12 ENCOUNTER — Ambulatory Visit: Payer: Medicare Other | Admitting: Occupational Therapy

## 2016-12-12 ENCOUNTER — Encounter (HOSPITAL_COMMUNITY)
Admission: RE | Disposition: A | Discharge status: Home / Self Care | Payer: Self-pay | Source: Ambulatory Visit | Attending: Vascular Surgery

## 2016-12-12 ENCOUNTER — Inpatient Hospital Stay (HOSPITAL_COMMUNITY): Payer: Medicare Other | Admitting: Anesthesiology

## 2016-12-12 ENCOUNTER — Ambulatory Visit: Payer: Medicare Other

## 2016-12-12 ENCOUNTER — Encounter (HOSPITAL_COMMUNITY): Payer: Self-pay | Admitting: *Deleted

## 2016-12-12 DIAGNOSIS — I2510 Atherosclerotic heart disease of native coronary artery without angina pectoris: Secondary | ICD-10-CM | POA: Diagnosis present

## 2016-12-12 DIAGNOSIS — R338 Other retention of urine: Secondary | ICD-10-CM | POA: Diagnosis present

## 2016-12-12 DIAGNOSIS — I509 Heart failure, unspecified: Secondary | ICD-10-CM | POA: Diagnosis present

## 2016-12-12 DIAGNOSIS — Y838 Other surgical procedures as the cause of abnormal reaction of the patient, or of later complication, without mention of misadventure at the time of the procedure: Secondary | ICD-10-CM | POA: Diagnosis not present

## 2016-12-12 DIAGNOSIS — I6521 Occlusion and stenosis of right carotid artery: Secondary | ICD-10-CM | POA: Diagnosis present

## 2016-12-12 DIAGNOSIS — G20 Parkinson's disease: Secondary | ICD-10-CM | POA: Diagnosis present

## 2016-12-12 DIAGNOSIS — I4891 Unspecified atrial fibrillation: Secondary | ICD-10-CM | POA: Diagnosis present

## 2016-12-12 DIAGNOSIS — I714 Abdominal aortic aneurysm, without rupture: Secondary | ICD-10-CM | POA: Diagnosis present

## 2016-12-12 DIAGNOSIS — Z87891 Personal history of nicotine dependence: Secondary | ICD-10-CM | POA: Diagnosis not present

## 2016-12-12 DIAGNOSIS — Z8673 Personal history of transient ischemic attack (TIA), and cerebral infarction without residual deficits: Secondary | ICD-10-CM

## 2016-12-12 DIAGNOSIS — E785 Hyperlipidemia, unspecified: Secondary | ICD-10-CM | POA: Diagnosis present

## 2016-12-12 DIAGNOSIS — I6523 Occlusion and stenosis of bilateral carotid arteries: Secondary | ICD-10-CM | POA: Diagnosis present

## 2016-12-12 DIAGNOSIS — Z79899 Other long term (current) drug therapy: Secondary | ICD-10-CM | POA: Diagnosis not present

## 2016-12-12 DIAGNOSIS — Z951 Presence of aortocoronary bypass graft: Secondary | ICD-10-CM

## 2016-12-12 DIAGNOSIS — N401 Enlarged prostate with lower urinary tract symptoms: Secondary | ICD-10-CM | POA: Diagnosis present

## 2016-12-12 DIAGNOSIS — L7632 Postprocedural hematoma of skin and subcutaneous tissue following other procedure: Secondary | ICD-10-CM | POA: Diagnosis not present

## 2016-12-12 DIAGNOSIS — Z7982 Long term (current) use of aspirin: Secondary | ICD-10-CM

## 2016-12-12 DIAGNOSIS — K219 Gastro-esophageal reflux disease without esophagitis: Secondary | ICD-10-CM | POA: Diagnosis present

## 2016-12-12 DIAGNOSIS — M1990 Unspecified osteoarthritis, unspecified site: Secondary | ICD-10-CM | POA: Diagnosis present

## 2016-12-12 HISTORY — PX: PATCH ANGIOPLASTY: SHX6230

## 2016-12-12 HISTORY — PX: CAROTID ENDARTERECTOMY: SUR193

## 2016-12-12 HISTORY — PX: ENDARTERECTOMY: SHX5162

## 2016-12-12 HISTORY — DX: Occlusion and stenosis of bilateral carotid arteries: I65.23

## 2016-12-12 LAB — URINALYSIS, ROUTINE W REFLEX MICROSCOPIC
Bilirubin Urine: NEGATIVE
GLUCOSE, UA: NEGATIVE mg/dL
Ketones, ur: 5 mg/dL — AB
Nitrite: POSITIVE — AB
PH: 5 (ref 5.0–8.0)
Protein, ur: >=300 mg/dL — AB
SQUAMOUS EPITHELIAL / LPF: NONE SEEN
Specific Gravity, Urine: 1.024 (ref 1.005–1.030)

## 2016-12-12 SURGERY — ENDARTERECTOMY, CAROTID
Anesthesia: General | Site: Neck | Laterality: Right

## 2016-12-12 MED ORDER — SODIUM CHLORIDE 0.9 % IV SOLN
INTRAVENOUS | Status: DC | PRN
  Administered 2016-12-12: 500 mL

## 2016-12-12 MED ORDER — ROCURONIUM BROMIDE 100 MG/10ML IV SOLN
INTRAVENOUS | Status: DC | PRN
  Administered 2016-12-12: 50 mg via INTRAVENOUS
  Administered 2016-12-12: 20 mg via INTRAVENOUS

## 2016-12-12 MED ORDER — FENTANYL CITRATE (PF) 250 MCG/5ML IJ SOLN
INTRAMUSCULAR | Status: AC
  Filled 2016-12-12: qty 5

## 2016-12-12 MED ORDER — HYDRALAZINE HCL 20 MG/ML IJ SOLN: 5.0000 mg | INTRAMUSCULAR | Status: DC | PRN

## 2016-12-12 MED ORDER — POLYETHYLENE GLYCOL 3350 17 G PO PACK: 17.0000 g | PACK | Freq: Every day | ORAL | Status: DC | PRN

## 2016-12-12 MED ORDER — FENTANYL CITRATE (PF) 100 MCG/2ML IJ SOLN: 25.0000 ug | INTRAMUSCULAR | Status: DC | PRN

## 2016-12-12 MED ORDER — 0.9 % SODIUM CHLORIDE (POUR BTL) OPTIME
TOPICAL | Status: DC | PRN
  Administered 2016-12-12: 2000 mL

## 2016-12-12 MED ORDER — MAGNESIUM SULFATE 2 GM/50ML IV SOLN
2.0000 g | Freq: Every day | INTRAVENOUS | Status: DC | PRN
  Filled 2016-12-12: qty 50

## 2016-12-12 MED ORDER — DEXTROSE 5 % IV SOLN
1.5000 g | INTRAVENOUS | Status: AC
  Administered 2016-12-12: 1.5 g via INTRAVENOUS
  Filled 2016-12-12: qty 1.5

## 2016-12-12 MED ORDER — SODIUM CHLORIDE 0.9 % IV SOLN: INTRAVENOUS | Status: DC

## 2016-12-12 MED ORDER — CIPROFLOXACIN HCL 500 MG PO TABS
500.0000 mg | ORAL_TABLET | Freq: Two times a day (BID) | ORAL | Status: DC
  Administered 2016-12-12 – 2016-12-13 (×3): 500 mg via ORAL
  Filled 2016-12-12 (×3): qty 1

## 2016-12-12 MED ORDER — EPHEDRINE SULFATE-NACL 50-0.9 MG/10ML-% IV SOSY
PREFILLED_SYRINGE | INTRAVENOUS | Status: DC | PRN
  Administered 2016-12-12: 5 mg via INTRAVENOUS
  Administered 2016-12-12 (×4): 10 mg via INTRAVENOUS

## 2016-12-12 MED ORDER — LACTATED RINGERS IV SOLN
INTRAVENOUS | Status: DC | PRN
Start: 2016-12-12 — End: 2016-12-12
  Administered 2016-12-12 (×2): via INTRAVENOUS

## 2016-12-12 MED ORDER — ROCURONIUM BROMIDE 10 MG/ML (PF) SYRINGE
PREFILLED_SYRINGE | INTRAVENOUS | Status: AC
  Filled 2016-12-12: qty 5

## 2016-12-12 MED ORDER — SODIUM CHLORIDE 0.9 % IV SOLN
0.0125 ug/kg/min | INTRAVENOUS | Status: DC
  Administered 2016-12-12: .07 ug/kg/min via INTRAVENOUS
  Filled 2016-12-12: qty 2000

## 2016-12-12 MED ORDER — OXYCODONE-ACETAMINOPHEN 5-325 MG PO TABS: 1.0000 | ORAL_TABLET | Freq: Four times a day (QID) | ORAL | 0 refills | Status: DC | PRN

## 2016-12-12 MED ORDER — LIDOCAINE 2% (20 MG/ML) 5 ML SYRINGE
INTRAMUSCULAR | Status: AC
  Filled 2016-12-12: qty 10

## 2016-12-12 MED ORDER — FENTANYL CITRATE (PF) 100 MCG/2ML IJ SOLN
INTRAMUSCULAR | Status: DC | PRN
  Administered 2016-12-12: 75 ug via INTRAVENOUS

## 2016-12-12 MED ORDER — POTASSIUM CHLORIDE CRYS ER 20 MEQ PO TBCR: 20.0000 meq | EXTENDED_RELEASE_TABLET | Freq: Every day | ORAL | Status: DC | PRN

## 2016-12-12 MED ORDER — OXYCODONE-ACETAMINOPHEN 5-325 MG PO TABS: 1.0000 | ORAL_TABLET | ORAL | Status: DC | PRN

## 2016-12-12 MED ORDER — PHENOL 1.4 % MT LIQD
1.0000 | OROMUCOSAL | Status: DC | PRN
  Administered 2016-12-13: 1 via OROMUCOSAL
  Filled 2016-12-12: qty 177

## 2016-12-12 MED ORDER — CHLORHEXIDINE GLUCONATE 4 % EX LIQD: 60.0000 mL | Freq: Once | CUTANEOUS | Status: DC

## 2016-12-12 MED ORDER — DOCUSATE SODIUM 100 MG PO CAPS
100.0000 mg | ORAL_CAPSULE | Freq: Every day | ORAL | Status: DC
  Administered 2016-12-13: 100 mg via ORAL
  Filled 2016-12-12: qty 1

## 2016-12-12 MED ORDER — PROTAMINE SULFATE 10 MG/ML IV SOLN
INTRAVENOUS | Status: DC | PRN
  Administered 2016-12-12: 20 mg via INTRAVENOUS
  Administered 2016-12-12: 50 mg via INTRAVENOUS
  Administered 2016-12-12: 10 mg via INTRAVENOUS

## 2016-12-12 MED ORDER — BISACODYL 10 MG RE SUPP: 10.0000 mg | Freq: Every day | RECTAL | Status: DC | PRN

## 2016-12-12 MED ORDER — EPHEDRINE 5 MG/ML INJ
INTRAVENOUS | Status: AC
  Filled 2016-12-12: qty 10

## 2016-12-12 MED ORDER — PROPOFOL 10 MG/ML IV BOLUS
INTRAVENOUS | Status: AC
  Filled 2016-12-12: qty 20

## 2016-12-12 MED ORDER — FLUTICASONE PROPIONATE 50 MCG/ACT NA SUSP
2.0000 | Freq: Two times a day (BID) | NASAL | Status: DC | PRN
  Filled 2016-12-12: qty 16

## 2016-12-12 MED ORDER — PROTAMINE SULFATE 10 MG/ML IV SOLN
INTRAVENOUS | Status: AC
  Filled 2016-12-12: qty 5

## 2016-12-12 MED ORDER — ALUM & MAG HYDROXIDE-SIMETH 200-200-20 MG/5ML PO SUSP: 15.0000 mL | ORAL | Status: DC | PRN

## 2016-12-12 MED ORDER — LABETALOL HCL 5 MG/ML IV SOLN: 10.0000 mg | INTRAVENOUS | Status: DC | PRN

## 2016-12-12 MED ORDER — TAMSULOSIN HCL 0.4 MG PO CAPS
0.4000 mg | ORAL_CAPSULE | Freq: Every day | ORAL | Status: DC
  Administered 2016-12-12 – 2016-12-13 (×2): 0.4 mg via ORAL
  Filled 2016-12-12 (×2): qty 1

## 2016-12-12 MED ORDER — LACTATED RINGERS IV SOLN
INTRAVENOUS | Status: DC | PRN
  Administered 2016-12-12: 08:00:00 via INTRAVENOUS

## 2016-12-12 MED ORDER — HEPARIN SODIUM (PORCINE) 1000 UNIT/ML IJ SOLN
INTRAMUSCULAR | Status: DC | PRN
  Administered 2016-12-12: 8000 [IU] via INTRAVENOUS

## 2016-12-12 MED ORDER — ATORVASTATIN CALCIUM 20 MG PO TABS
20.0000 mg | ORAL_TABLET | Freq: Every day | ORAL | Status: DC
  Administered 2016-12-12 – 2016-12-13 (×2): 20 mg via ORAL
  Filled 2016-12-12 (×2): qty 1

## 2016-12-12 MED ORDER — DEXAMETHASONE SODIUM PHOSPHATE 10 MG/ML IJ SOLN
INTRAMUSCULAR | Status: DC | PRN
  Administered 2016-12-12: 10 mg via INTRAVENOUS

## 2016-12-12 MED ORDER — ONDANSETRON HCL 4 MG/2ML IJ SOLN
INTRAMUSCULAR | Status: DC | PRN
  Administered 2016-12-12: 4 mg via INTRAVENOUS

## 2016-12-12 MED ORDER — FUROSEMIDE 40 MG PO TABS
40.0000 mg | ORAL_TABLET | Freq: Every day | ORAL | Status: DC
  Administered 2016-12-13: 40 mg via ORAL
  Filled 2016-12-12: qty 1

## 2016-12-12 MED ORDER — CARBIDOPA-LEVODOPA ER 50-200 MG PO TBCR
1.0000 | EXTENDED_RELEASE_TABLET | Freq: Four times a day (QID) | ORAL | Status: DC
  Administered 2016-12-12 – 2016-12-13 (×4): 1 via ORAL
  Filled 2016-12-12 (×8): qty 1

## 2016-12-12 MED ORDER — PHENYLEPHRINE 40 MCG/ML (10ML) SYRINGE FOR IV PUSH (FOR BLOOD PRESSURE SUPPORT)
PREFILLED_SYRINGE | INTRAVENOUS | Status: AC
  Filled 2016-12-12: qty 10

## 2016-12-12 MED ORDER — GERHARDT'S BUTT CREAM
1.0000 "application " | TOPICAL_CREAM | Freq: Three times a day (TID) | CUTANEOUS | Status: DC
  Administered 2016-12-12 – 2016-12-13 (×3): 1 via TOPICAL
  Filled 2016-12-12: qty 1

## 2016-12-12 MED ORDER — PROPOFOL 10 MG/ML IV BOLUS
INTRAVENOUS | Status: DC | PRN
  Administered 2016-12-12: 100 mg via INTRAVENOUS

## 2016-12-12 MED ORDER — METOPROLOL TARTRATE 5 MG/5ML IV SOLN: 2.0000 mg | INTRAVENOUS | Status: DC | PRN

## 2016-12-12 MED ORDER — GUAIFENESIN-DM 100-10 MG/5ML PO SYRP: 15.0000 mL | ORAL_SOLUTION | ORAL | Status: DC | PRN

## 2016-12-12 MED ORDER — CEFUROXIME SODIUM 1.5 G IV SOLR: 1.5000 g | INTRAVENOUS | Status: DC

## 2016-12-12 MED ORDER — MIDAZOLAM HCL 2 MG/2ML IJ SOLN
INTRAMUSCULAR | Status: AC
  Filled 2016-12-12: qty 2

## 2016-12-12 MED ORDER — MORPHINE SULFATE (PF) 2 MG/ML IV SOLN: 2.0000 mg | INTRAVENOUS | Status: DC | PRN

## 2016-12-12 MED ORDER — DEXTROSE 5 % IV SOLN
1.5000 g | Freq: Two times a day (BID) | INTRAVENOUS | Status: AC
  Administered 2016-12-12 – 2016-12-13 (×2): 1.5 g via INTRAVENOUS
  Filled 2016-12-12 (×2): qty 1.5

## 2016-12-12 MED ORDER — PHENYLEPHRINE 40 MCG/ML (10ML) SYRINGE FOR IV PUSH (FOR BLOOD PRESSURE SUPPORT)
PREFILLED_SYRINGE | INTRAVENOUS | Status: DC | PRN
  Administered 2016-12-12: 20 ug via INTRAVENOUS
  Administered 2016-12-12 (×2): 80 ug via INTRAVENOUS

## 2016-12-12 MED ORDER — ONDANSETRON HCL 4 MG/2ML IJ SOLN: 4.0000 mg | Freq: Once | INTRAMUSCULAR | Status: DC | PRN

## 2016-12-12 MED ORDER — LIDOCAINE 2% (20 MG/ML) 5 ML SYRINGE
INTRAMUSCULAR | Status: DC | PRN
  Administered 2016-12-12: 100 mg via INTRAVENOUS

## 2016-12-12 MED ORDER — ACETAMINOPHEN 650 MG RE SUPP: 325.0000 mg | RECTAL | Status: DC | PRN

## 2016-12-12 MED ORDER — ZINC OXIDE 40 % EX OINT
TOPICAL_OINTMENT | Freq: Three times a day (TID) | CUTANEOUS | Status: DC | PRN
  Filled 2016-12-12: qty 114

## 2016-12-12 MED ORDER — GLYCOPYRROLATE 0.2 MG/ML IJ SOLN
INTRAMUSCULAR | Status: DC | PRN
  Administered 2016-12-12: 0.2 mg via INTRAVENOUS

## 2016-12-12 MED ORDER — LIDOCAINE HCL (PF) 1 % IJ SOLN
INTRAMUSCULAR | Status: AC
  Filled 2016-12-12: qty 30

## 2016-12-12 MED ORDER — ONDANSETRON HCL 4 MG/2ML IJ SOLN: 4.0000 mg | Freq: Four times a day (QID) | INTRAMUSCULAR | Status: DC | PRN

## 2016-12-12 MED ORDER — PHENYLEPHRINE HCL 10 MG/ML IJ SOLN
INTRAMUSCULAR | Status: DC | PRN
  Administered 2016-12-12: 35 ug/min via INTRAVENOUS

## 2016-12-12 MED ORDER — PANTOPRAZOLE SODIUM 40 MG PO TBEC
40.0000 mg | DELAYED_RELEASE_TABLET | Freq: Every day | ORAL | Status: DC
  Administered 2016-12-13: 40 mg via ORAL
  Filled 2016-12-12: qty 1

## 2016-12-12 MED ORDER — SODIUM CHLORIDE 0.9 % IV SOLN
500.0000 mL | Freq: Once | INTRAVENOUS | Status: AC | PRN
  Administered 2016-12-12: 12:00:00 via INTRAVENOUS

## 2016-12-12 MED ORDER — DEXTROSE 5 % IV SOLN
INTRAVENOUS | Status: AC
  Filled 2016-12-12: qty 1.5

## 2016-12-12 MED ORDER — ACETAMINOPHEN 325 MG PO TABS: 325.0000 mg | ORAL_TABLET | ORAL | Status: DC | PRN

## 2016-12-12 MED ORDER — ASPIRIN 81 MG PO CHEW
81.0000 mg | CHEWABLE_TABLET | Freq: Every day | ORAL | Status: DC
  Administered 2016-12-13: 81 mg via ORAL
  Filled 2016-12-12: qty 1

## 2016-12-12 MED ORDER — SODIUM CHLORIDE 0.9 % IV SOLN
INTRAVENOUS | Status: DC
  Administered 2016-12-12: 12:00:00 via INTRAVENOUS

## 2016-12-12 SURGICAL SUPPLY — 43 items
CANISTER SUCT 3000ML PPV (MISCELLANEOUS) ×3 IMPLANT
CANNULA VESSEL 3MM 2 BLNT TIP (CANNULA) ×6 IMPLANT
CATH ROBINSON RED A/P 18FR (CATHETERS) ×3 IMPLANT
CLIP VESOCCLUDE MED 6/CT (CLIP) ×3 IMPLANT
CLIP VESOCCLUDE SM WIDE 6/CT (CLIP) ×3 IMPLANT
CRADLE DONUT ADULT HEAD (MISCELLANEOUS) ×3 IMPLANT
DECANTER SPIKE VIAL GLASS SM (MISCELLANEOUS) IMPLANT
DERMABOND ADVANCED (GAUZE/BANDAGES/DRESSINGS) ×2
DERMABOND ADVANCED .7 DNX12 (GAUZE/BANDAGES/DRESSINGS) ×1 IMPLANT
DRAIN HEMOVAC 1/8 X 5 (WOUND CARE) IMPLANT
ELECT REM PT RETURN 9FT ADLT (ELECTROSURGICAL) ×3
ELECTRODE REM PT RTRN 9FT ADLT (ELECTROSURGICAL) ×1 IMPLANT
EVACUATOR SILICONE 100CC (DRAIN) IMPLANT
GLOVE BIO SURGEON STRL SZ 6.5 (GLOVE) ×10 IMPLANT
GLOVE BIO SURGEON STRL SZ7 (GLOVE) ×3 IMPLANT
GLOVE BIO SURGEON STRL SZ7.5 (GLOVE) ×3 IMPLANT
GLOVE BIO SURGEONS STRL SZ 6.5 (GLOVE) ×5
GLOVE BIOGEL PI IND STRL 6.5 (GLOVE) ×3 IMPLANT
GLOVE BIOGEL PI INDICATOR 6.5 (GLOVE) ×6
GLOVE ECLIPSE 6.5 STRL STRAW (GLOVE) ×3 IMPLANT
GOWN STRL REUS W/ TWL LRG LVL3 (GOWN DISPOSABLE) ×5 IMPLANT
GOWN STRL REUS W/TWL LRG LVL3 (GOWN DISPOSABLE) ×10
HEMOSTAT SPONGE AVITENE ULTRA (HEMOSTASIS) IMPLANT
KIT BASIN OR (CUSTOM PROCEDURE TRAY) ×3 IMPLANT
KIT ROOM TURNOVER OR (KITS) ×3 IMPLANT
KIT SHUNT ARGYLE CAROTID ART 6 (VASCULAR PRODUCTS) IMPLANT
LOOP VESSEL MINI RED (MISCELLANEOUS) ×3 IMPLANT
NEEDLE HYPO 25GX1X1/2 BEV (NEEDLE) IMPLANT
NS IRRIG 1000ML POUR BTL (IV SOLUTION) ×6 IMPLANT
PACK CAROTID (CUSTOM PROCEDURE TRAY) ×3 IMPLANT
PAD ARMBOARD 7.5X6 YLW CONV (MISCELLANEOUS) ×6 IMPLANT
PATCH HEMASHIELD 8X75 (Vascular Products) ×3 IMPLANT
SHUNT CAROTID BYPASS 10 (VASCULAR PRODUCTS) ×3 IMPLANT
SHUNT CAROTID BYPASS 12FRX15.5 (VASCULAR PRODUCTS) IMPLANT
SUT ETHILON 3 0 PS 1 (SUTURE) IMPLANT
SUT PROLENE 6 0 CC (SUTURE) ×9 IMPLANT
SUT SILK 3 0 TIES 17X18 (SUTURE)
SUT SILK 3-0 18XBRD TIE BLK (SUTURE) IMPLANT
SUT VIC AB 3-0 SH 27 (SUTURE) ×2
SUT VIC AB 3-0 SH 27X BRD (SUTURE) ×1 IMPLANT
SUT VICRYL 4-0 PS2 18IN ABS (SUTURE) ×3 IMPLANT
SYR CONTROL 10ML LL (SYRINGE) IMPLANT
WATER STERILE IRR 1000ML POUR (IV SOLUTION) ×3 IMPLANT

## 2016-12-12 NOTE — Anesthesia Procedure Notes (Signed)
Procedure Name: Intubation Date/Time: 12/12/2016 7:42 AM Performed by: Trixie Deis A Pre-anesthesia Checklist: Patient identified, Emergency Drugs available, Suction available and Patient being monitored Patient Re-evaluated:Patient Re-evaluated prior to induction Oxygen Delivery Method: Circle System Utilized Preoxygenation: Pre-oxygenation with 100% oxygen Induction Type: IV induction Ventilation: Mask ventilation without difficulty Laryngoscope Size: Mac and 4 Grade View: Grade I Tube type: Oral Tube size: 7.5 mm Number of attempts: 1 Airway Equipment and Method: Stylet and Oral airway Placement Confirmation: ETT inserted through vocal cords under direct vision,  positive ETCO2 and breath sounds checked- equal and bilateral Secured at: 23 cm Tube secured with: Tape Dental Injury: Teeth and Oropharynx as per pre-operative assessment

## 2016-12-12 NOTE — Interval H&P Note (Signed)
History and Physical Interval Note:  12/12/2016 7:19 AM  Gregory [name]  has presented today for surgery, with the diagnosis of Right Internal Carotid Artery Stenosis  I65.21  The various methods of treatment have been discussed with the patient and family. After consideration of risks, benefits and other options for treatment, the patient has consented to  Procedure(s): ENDARTERECTOMY CAROTID (Right) as a surgical intervention .  The patient's history has been reviewed, patient examined, no change in status, stable for surgery.  I have reviewed the patient's chart and labs.  Questions were answered to the patient's satisfaction.     Fabienne BrunsFields, Simmone [place]

## 2016-12-12 NOTE — Anesthesia Procedure Notes (Signed)
Arterial Line Insertion Start/End7/25/2018 7:00 AM, 12/12/2016 7:15 AM Performed by: Geraldo DockerSOLHEIM, JOSHUA SALOMON, CRNA  Patient location: Pre-op. Preanesthetic checklist: patient identified, IV checked, risks and benefits discussed, surgical consent, monitors and equipment checked, pre-op evaluation and timeout performed Lidocaine 1% used for infiltration Right, radial was placed Catheter size: 20 G Hand hygiene performed  and maximum sterile barriers used  Allen's test indicative of satisfactory collateral circulation Attempts: 1 Procedure performed without using ultrasound guided technique. Following insertion, dressing applied and Biopatch. Post procedure assessment: normal  Patient tolerated the procedure well with no immediate complications.

## 2016-12-12 NOTE — Anesthesia Postprocedure Evaluation (Signed)
Anesthesia Post Note  Patient: Gregory [name]  Procedure(s) Performed: Procedure(s) (LRB): Right Carotid artery endartarectomy (Right) PATCH ANGIOPLASTY (Right)     Patient location during evaluation: PACU Anesthesia Type: General Level of consciousness: awake and alert Pain management: pain level controlled Vital Signs Assessment: post-procedure vital signs reviewed and stable Respiratory status: spontaneous breathing, nonlabored ventilation and respiratory function stable Cardiovascular status: blood pressure returned to baseline and stable Postop Assessment: no signs of nausea or vomiting Anesthetic complications: no    Last Vitals:  Vitals:   12/12/16 1621 12/12/16 1630  BP: (!) 96/41   Pulse: (!) 58 (!) 47  Resp: 15 14  Temp:      Last Pain:  Vitals:   12/12/16 1613  TempSrc:   PainSc: 0-No pain                 Cecile HearingStephen Edward Turk

## 2016-12-12 NOTE — Op Note (Signed)
Procedure Right carotid endarterectomy  Preoperative diagnosis: Symptomatic right internal carotid artery stenosis  Postoperative diagnosis: Same  Anesthesia General  Asst.: Doreatha MassedSamantha Rhyne, PAC  Operative findings: #1 greater than 90% right internal carotid stenosis                                #2 Dacron patch           #3 High bifurcation                                #4 no shunt secondary to vessel tortuosity  Operative details: After obtaining informed consent, the patient was taken to the operating room. The patient was placed in a supine position on the operating room table. After induction of general anesthesia the patient's entire neck and chest was prepped and draped in the usual sterile fashion. An oblique incision was made on the right aspect of the patient's neck anterior to the border the right sternocleidomastoid muscle. The incision was carried into the subcutaneous tissues and through the platysma. The sternocleidomastoid muscle was identified and reflected laterally.  The common carotid artery was then found at the base of the incision this was dissected free circumferentially. It was fairly soft on palpation.  The vagus nerve was identified and protected. Dissection was then carried up to the level carotid bifurcation.   This was slightly higher than normal and the diseased portion of the carotid extended several centimeters into the internal carotid so that I had to dissect out the artery all the way to the digastric muscle.The hyperglossal nerve was well just above the primary area of dissection. It was identified and protected.  The internal carotid artery was dissected free circumferentially just below the level of the hypoglossal nerve and it was soft in character at this location and above any palpable disease. A vessel loop was placed around this. Next the external carotid and superior thyroid arteries as well as an ascending pharyngeal branch were dissected free  circumferentially and vessel loops were placed around these. The patient was given 8000 units of intravenous heparin.  After 2 minutes of circulation time and raising the mean arterial pressure to 90 mm mercury, the distal internal carotid artery was controlled with small bulldog clamp. The external carotid and superior thyroid arteries were controlled with vessel loops. The common carotid artery was controlled with a peripheral DeBakey clamp. A longitudinal opening was made in the common carotid artery just below the bifurcation. The arteriotomy was extended distally up into the internal carotid with Potts scissors. There was a large calcified plaque with greater than 90% stenosis in the internal carotid.  A 10 Fr shunt was brought onto the field but I could not get this to advance into the distal internal carotid due to tortuosity of the vessel.  There was brisk backbleeding from the internal carotid so I proceeded without the shunt.  Attention was then turned to the common carotid artery once again. A suitable endarterectomy plane was obtained and endarterectomy was begun in the common carotid artery and a good proximal endpoint was obtained. An eversion endarterectomy was performed on the external carotid artery and a good endpoint was obtained. The plaque was then elevated in the internal carotid artery and a nice feathered distal endpoint was also obtained.  The plaque was passed off the table. All loose debris was then  removed from the carotid bed and everything was thoroughly irrigated with heparinized saline. A Dacron patch was then brought on to the operative field and this was sewn on as a patch angioplasty using a running 6-0 Prolene suture.  The arteries were again controlled sequentially internal followed by common carotid.  Prior to completion of the anastomosis the internal carotid artery was thoroughly backbled. This was then controlled again with a fine bulldog clamp.  The common carotid was  thoroughly flushed forward. The external carotid was also thoroughly backbled.  The remainder of the patch was completed and the anastomosis was secured. Flow was then restored first retrograde from the external carotid into the carotid bed then antegrade from the common carotid to the external carotid artery and after approximately 5 cardiac cycles to the internal carotid artery. Doppler was used to evaluate the external/internal and common carotid arteries and these all had good Doppler flow. Hemostasis was obtained. The patient was also given 80 mg of Protamine.      The platysma muscle was reapproximated using a running 3-0 Vicryl suture. The skin was closed with 4 0 Vicryl subcuticular stitch.  The patient was awakened in the operating room and was moving upper and lower extremities symmetrically and following commands.  The patient was stable on arrival to the PACU.  Fabienne Brunsharles Oluwadamilare Tobler, MD [facility] Office: [phone] Pager: [phone]

## 2016-12-12 NOTE — Transfer of Care (Signed)
Immediate Anesthesia Transfer of Care Note  Patient: Gregory [name]  Procedure(s) Performed: Procedure(s): Right Carotid artery endartarectomy (Right) PATCH ANGIOPLASTY (Right)  Patient Location: PACU  Anesthesia Type:General  Level of Consciousness: awake, alert  and oriented  Airway & Oxygen Therapy: Patient Spontanous Breathing and Patient connected to nasal cannula oxygen  Post-op Assessment: Report given to RN, Post -op Vital signs reviewed and stable, Patient moving all extremities X 4 and Patient able to stick tongue midline  Post vital signs: Reviewed and stable  Last Vitals:  Vitals:   12/12/16 1200 12/12/16 1206  BP:  (!) 106/49  Pulse: 72 66  Resp: 14 19  Temp:      Last Pain:  Vitals:   12/12/16 1223  TempSrc:   PainSc: 6       Patients Stated Pain Goal: Other (Comment) (12/12/16 1223)  Complications: No apparent anesthesia complications. SBP decreased, rhythm A-fib/NSR with PVCs. Fluid bolus given

## 2016-12-12 NOTE — H&P (View-Only) (Signed)
Patient is a 76 year old male who returns for follow-up today. 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 However at that time he had a significant trauma was not a candidate for operation. He was recently seen by my partner Dr. Edilia Boickson. It was thought that he had recovered from his stroke and his traumatic event. Dr. Edilia Boickson was concerned that the patient had a high lesion and wished him to be considered for carotid stenting. He returns today after CT Angio of the head. He is also recently had a CT angiogram of the neck. The patient states he has fully recovered from his stroke as well as his trauma. He still has some overall mild deconditioning but this is improving. His family also states he has had some decline in cognitive function since all of these events. He has not had any new symptoms of TIA amaurosis or stroke. He is on a statin and aspirin. Other chronic medical problems include Parkinson's and urinary retention. These have both been stable. He does have an indwelling Foley catheter. He also has a known 3.8 cm abdominal aortic aneurysm which has been stable.  Current Outpatient Prescriptions on File Prior to Visit  Medication Sig Dispense Refill  . aspirin 81 MG chewable tablet Chew 1 tablet (81 mg total) by mouth daily.    [name]. atorvastatin (LIPITOR) 20 MG tablet Take 20 mg by mouth daily.    . carbidopa-levodopa (SINEMET CR) 50-200 MG tablet Take 1 pill 4 times a day, 9 AM, 1 PM, 5 PM and 9 PM 270 tablet 3  . fluticasone (FLONASE) 50 MCG/ACT nasal spray Place 2 sprays into both nostrils 2 (two) times daily as needed for allergies or rhinitis.  2  . furosemide (LASIX) 40 MG tablet Take 1 tablet (40 mg total) by mouth daily. 30 tablet 0  . Hydrocortisone (GERHARDT'S BUTT CREAM) CREA Apply 1 application topically 3 (three) times daily. 1 each 0  . pantoprazole (PROTONIX) 40 MG tablet Take 1 tablet (40 mg total) by mouth daily. 30  tablet 0  . polyethylene glycol (MIRALAX / GLYCOLAX) packet Take 17 g by mouth daily as needed for mild constipation. 14 each 0  . tamsulosin (FLOMAX) 0.4 MG CAPS capsule Take 1 capsule (0.4 mg total) by mouth daily after supper. 30 capsule 0   No current facility-administered medications on file prior to visit.    Past Medical History:  Diagnosis Date  . AAA (abdominal aortic aneurysm) (HCC)   . Abnormal stress test 08/08/12  . AF (atrial fibrillation) (HCC) 2014  . Atrial fibrillation (HCC)   . Bilateral carotid artery stenosis 08/14/12   moderate bilaterally per carotid duplex  . CAD (coronary artery disease)   . Coronary artery disease   . Deformed pylorus, acquired   . Erythema of esophagus   . Esophageal stricture   . Fatty infiltration of liver   . Fatty liver   . GERD (gastroesophageal reflux disease)   . History of esophageal stricture   . Hyperlipidemia   . Parkinsonism (HCC)   . S/P cardiac cath 08/12/12  . Splenomegaly    Past Surgical History:  Procedure Laterality Date  . COLONOSCOPY  2004  . CORONARY ARTERY BYPASS GRAFT    . CORONARY ARTERY BYPASS GRAFT N/A 09/16/2012   Procedure: CORONARY ARTERY BYPASS GRAFTING (CABG) TIMES ONE USING LEFT INTERNAL MAMMARY ARTERY;  Surgeon: Alleen BorneBryan K Bartle, MD;  Location: [facility] OR;  Service: Open Heart  Surgery;  Laterality: N/A;  . EYE SURGERY  2012   left cataract extraction  . INNER EAR SURGERY  1995   Dr. Soyla MurphyKauffman...placement of shunt  . IR GASTROSTOMY TUBE REMOVAL  09/27/2016  . IR GENERIC HISTORICAL  07/25/2016   IR GASTROSTOMY TUBE MOD SED 07/25/2016 [name], MD [facility]-INTER[facility] RAD  . LEFT HEART CATHETERIZATION WITH CORONARY ANGIOGRAM N/A 08/19/2012   Procedure: LEFT HEART CATHETERIZATION WITH CORONARY ANGIOGRAM;  Surgeon: Pamella PertJagadeesh R Ganji, MD;  [facility];  Service: Cardiovascular;  Laterality: N/A;  . TRACHEOSTOMY TUBE PLACEMENT N/A 07/11/2016   Procedure: TRACHEOSTOMY;  Surgeon: Serena ColonelJefry Rosen, MD;  [facility];  Service:  ENT;  Laterality: N/A;     Review of systems: He denies shortness of breath. He denies chest pain.  Physical exam:  Vitals:   11/29/16 1507 11/29/16 1509  BP: 108/66 115/69  Pulse: (!) 47   Resp: 20   Temp: (!) 97.5 F (36.4 C)   TempSrc: Oral   SpO2: 100%   Weight: 178 lb 6.4 oz (80.9 kg)   Height: 5' 10.5" (1.791 m)     Neck: No carotid bruits  Chest: Clear to auscultation bilaterally  Cardiac: Regular rate and rhythm without murmur.  Abdomen: Soft nontender nondistended  Extremities: 2+ femoral pulses   Neuro: Symmetric upper and lower extremity motor strength no facial asymmetry  Data: I reviewed the patient's images from his CT angiogram of the head and neck. This does show a relatively high lesion but I think it is accessible through a cervical incision. The appearance of the plaque I think would be high risk for tcar or carotid stenting due to a large amount of thrombus and a "speckled in appearance of the lesion which is been shown to be higher risk of stroke with tcar.    Assessment: Greater than 80% right internal carotid artery stenosis symptomatic prior stroke. Patient will be scheduled for right carotid endarterectomy on July 25. I had a lengthy discussion with the patient and his family today regarding risks benefits possible complications. These include but are not limited to bleeding infection stroke risk of 1-2%. Cranial nerve injury risk of 15-20% with less than 1% of these being permanent. Myocardial events. I had a lengthy discussion with the family that the high anatomic location is primarily her risk for cranial nerve injury and they understand this. They also understand carotid stenting carries higher stroke risk.  Abdominal aortic aneurysm approximate 4 cm. Will need repeat ultrasound in 6 months.  Fabienne Brunsharles Fields, MD [facility] Office: [phone] Pager: [phone]

## 2016-12-12 NOTE — Discharge Instructions (Signed)
° °  [facility] ° °Discharge Instructions °  °Carotid Endarterectomy (CEA) ° °Please refer to the following instructions for your post-procedure care. Your surgeon or physician assistant will discuss any changes with you. ° °Activity ° °You are encouraged to walk as much as you can. You can slowly return to normal activities but must avoid strenuous activity and heavy lifting until your doctor tell you it's OK. Avoid activities such as vacuuming or swinging a golf club. You can drive after one week if you are comfortable and you are no longer taking prescription pain medications. It is normal to feel tired for serval weeks after your surgery. It is also normal to have difficulty with sleep habits, eating, and bowel movements after surgery. These will go away with time. ° °Bathing/Showering ° °You may shower after you go home. Do not soak in a bathtub, hot tub, or swim until the incision heals completely. ° °Incision Care ° °Shower every day. Clean your incision with mild soap and water. Pat the area dry with a clean towel. You do not need a bandage unless otherwise instructed. Do not apply any ointments or creams to your incision. You may have skin glue on your incision. Do not peel it off. It will come off on its own in about one week. Your incision may feel thickened and raised for several weeks after your surgery. This is normal and the skin will soften over time. For Men Only: It's OK to shave around the incision but do not shave the incision itself for 2 weeks. It is common to have numbness under your chin that could last for several months. ° °Diet ° °Resume your normal diet. There are no special food restrictions following this procedure. A low fat/low cholesterol diet is recommended for all patients with vascular disease. In order to heal from your surgery, it is CRITICAL to get adequate nutrition. Your body requires vitamins, minerals, and protein. Vegetables are the best  source of vitamins and minerals. Vegetables also provide the perfect balance of protein. Processed food has little nutritional value, so try to avoid this. ° °Medications ° °Resume taking all of your medications unless your doctor or physician assistant tells you not to. If your incision is causing pain, you may take over-the- counter pain relievers such as acetaminophen (Tylenol). If you were prescribed a stronger pain medication, please be aware these medications can cause nausea and constipation. Prevent nausea by taking the medication with a snack or meal. Avoid constipation by drinking plenty of fluids and eating foods with a high amount of fiber, such as fruits, vegetables, and grains. Do not take Tylenol if you are taking prescription pain medications. ° °Follow Up ° °Our office will schedule a follow up appointment 2-3 weeks following discharge. ° °Please call us immediately for any of the following conditions ° °Increased pain, redness, drainage (pus) from your incision site. °Fever of 101 degrees or higher. °If you should develop stroke (slurred speech, difficulty swallowing, weakness on one side of your body, loss of vision) you should call 911 and go to the nearest emergency room. ° °Reduce your risk of vascular disease: ° °Stop smoking. If you would like help call [facility] at 1-800-QUIT-NOW (1-800-784-8669) or [facility] at 336-586-4000. °Manage your cholesterol °Maintain a desired weight °Control your diabetes °Keep your blood pressure down ° °If you have any questions, please call the office at [phone]. ° °

## 2016-12-12 NOTE — Progress Notes (Signed)
Patient not discharged; documented on incorrect patient

## 2016-12-12 NOTE — Progress Notes (Signed)
  Day of Surgery Note    Subjective:  Resting comfortably  Vitals:   12/12/16 1151 12/12/16 1200  BP: (!) 90/49   Pulse: 71 72  Resp: 17 14  Temp:      Incisions:   Clean and dry without hematoma Extremities:  Moving all extremities equally Cardiac:  regular Lungs:  Non labored Neuro:  In tact; tongue is midline   Assessment/Plan:  This is a 76 y.o. male who is s/p right carotid endarterectomy  -pt doing well in recovery and neuro in tact/tongue is midline. -BP responded to fluid bolus.  May need another fluid bolus if pressure is soft -to 4 east later today.   Doreatha MassedSamantha Kingstin Heims, PA-C 12/12/2016 12:05 PM [phone]

## 2016-12-13 ENCOUNTER — Telehealth: Payer: Self-pay | Admitting: Vascular Surgery

## 2016-12-13 ENCOUNTER — Encounter (HOSPITAL_COMMUNITY): Payer: Self-pay | Admitting: General Practice

## 2016-12-13 LAB — CBC
HCT: 31.4 % — ABNORMAL LOW (ref 39.0–52.0)
Hemoglobin: 9.7 g/dL — ABNORMAL LOW (ref 13.0–17.0)
MCH: 26.1 pg (ref 26.0–34.0)
MCHC: 30.9 g/dL (ref 30.0–36.0)
MCV: 84.6 fL (ref 78.0–100.0)
PLATELETS: 215 10*3/uL (ref 150–400)
RBC: 3.71 MIL/uL — AB (ref 4.22–5.81)
RDW: 14.8 % (ref 11.5–15.5)
WBC: 9.5 10*3/uL (ref 4.0–10.5)

## 2016-12-13 LAB — BASIC METABOLIC PANEL
Anion gap: 7 (ref 5–15)
BUN: 9 mg/dL (ref 6–20)
CALCIUM: 8.8 mg/dL — AB (ref 8.9–10.3)
CO2: 27 mmol/L (ref 22–32)
CREATININE: 1.1 mg/dL (ref 0.61–1.24)
Chloride: 106 mmol/L (ref 101–111)
GFR calc Af Amer: >60 mL/min (ref >60)
GLUCOSE: 101 mg/dL — AB (ref 65–99)
Potassium: 3.9 mmol/L (ref 3.5–5.1)
SODIUM: 140 mmol/L (ref 135–145)

## 2016-12-13 NOTE — Progress Notes (Signed)
[facility]  Subjective  - feels ok, a little confusion last night   Objective (!) 96/49 (!) 57 98.1 F (36.7 C) (Oral) 18 95%  Intake/Output Summary (Last 24 hours) at 12/13/16 0601 Last data filed at 12/12/16 1600  Gross per 24 hour  Intake             1000 ml  Output             2250 ml  Net            -1250 ml   Right neck small amount of swelling, soft, no focal hematoma Neuro UE/LE 5/5 motor  Assessment/Planning: Doing well post CEA BP in 100s will have family check at home once daily.  Dr Nadara EatonGangi follows his BP Needs referral for outpt PT/OT D/c home later today if ambulates without dizziness   Fabienne BrunsFields, Charles 12/13/2016 6:01 AM --  Laboratory Lab Results:  Recent Labs  12/13/16 0322  WBC 9.5  HGB 9.7*  HCT 31.4*  PLT 215   BMET  Recent Labs  12/13/16 0322  NA 140  K 3.9  CL 106  CO2 27  GLUCOSE 101*  BUN 9  CREATININE 1.10  CALCIUM 8.8*    COAG Lab Results  Component Value Date   INR 1.14 12/07/2016   INR 1.19 07/25/2016   INR 1.26 06/02/2016   No results found for: PTT

## 2016-12-13 NOTE — Telephone Encounter (Signed)
Sched appt 01/31/17 at 1:00. Lm on hm#.

## 2016-12-13 NOTE — Care Management Note (Signed)
Case Management Note Donn PieriniKristi Kinnedy Mongiello RN, BSN Unit 4E-Case Manager [phone]  Patient Details  Name: Gregory [name] MRN: [id number] Date of Birth: 1941/02/03  Subjective/Objective:   Pt admitted s/p CEA                 Action/Plan: PTA pt lived at home with family- pt [facility] - per PT/OT evals recommendations for pt to continue in outpt therapy- referral received to set pt back up with outpt services. Spoke with pt and wife at bedside- confirmed that they wanted to [facility]- no DME needs at home- [facility] via epic referral per DR. Fields.    Expected Discharge Date:    12/13/16              Expected Discharge Plan:  Home/Self Care  In-House Referral:     Discharge planning Services  CM Consult, Other - See comment  Post Acute Care Choice:  NA Choice offered to:  NA  DME Arranged:  N/A DME Agency:  NA  HH Arranged:  NA HH Agency:  NA  Status of Service:  Completed, signed off  If discussed at Long Length of Stay Meetings, dates discussed:    Discharge Disposition: home/self care   Additional Comments:  Darrold SpanWebster, Trude Cansler Hall, RN 12/13/2016, 12:26 PM

## 2016-12-13 NOTE — Progress Notes (Signed)
Spoke with family concerning discharge.  Family was concerned that daughter called and asked for them to get to hospital ASAP for discharge and they were still waiting.  I explained that PT/OT would see patient and Dr. Darrick PennaFields would need to see patient first and that no orders were written for discharge.  After speaking with family, patient appears to have more swelling at endartrectomy site. Called PA LearnedSamantha and discharge postponed until tomorrow. PA Kim made aware and will assess patient. Pt resting with call bell within reach.  Will continue to monitor. Thomas HoffBurton, Alvenia Treese McClintock, RN

## 2016-12-13 NOTE — Progress Notes (Signed)
Patient heart rate dropped to 39 after working with OT.  Patient sustains in mid 40's to 50's. Patient states he did not feel dizzy, lightheaded and slower rate.  PA Newton PiggSamantha Ellington made aware and will discuss with Dr. Darrick PennaFields. Patient and family are anxious to be discharged. Aware that PT/OT will do evaluation and Dr. Darrick PennaFields will see him. Pt resting with call bell within reach.  Will continue to monitor. Thomas HoffBurton, Babs Dabbs McClintock, RN

## 2016-12-13 NOTE — Telephone Encounter (Signed)
-----   Message from Sharee PimpleMarilyn K McChesney, RN sent at 12/12/2016  3:58 PM EDT ----- Regarding: 2-3 weeks   ----- Message ----- From: Garth Schlatterhyne, Samantha J, PA-C Sent: 12/12/2016   3:40 PM To: [facility] Charge Pool  S/p right CEA 12/12/16.  F/u with Dr. Darrick PennaFields in 2-3 weeks.  Thanks
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
## 2016-12-13 NOTE — Progress Notes (Addendum)
  Progress Note  Asked to see patient this afternoon regarding increasing hematoma right neck. Hematoma is larger than this am per RN. Right neck with moderate hematoma but overall soft. Patient is stable without any respiratory distress or swallowing issues. BP has been soft in the 90s. HR in 40s-50s.       Was walking in the halls and up and down stairs without dizziness. Will keep overnight and monitor.   Maris BergerKimberly [place], PA-C [facility] Office: [phone] Pager: [phone] 12/13/2016 4:14 PM

## 2016-12-14 MED ORDER — CARBIDOPA-LEVODOPA ER 50-200 MG PO TBCR: 1.0000 | EXTENDED_RELEASE_TABLET | Freq: Four times a day (QID) | ORAL | Status: DC

## 2016-12-14 NOTE — Progress Notes (Addendum)
Progress Note  SUBJECTIVE:    POD #2  Had a good night. Throat still feels a little sore. No swallowing issues. No shortness of breath.   OBJECTIVE:   Vitals:   12/14/16 0026 12/14/16 0445  BP: 110/65 109/61  Pulse: 60 (!) 52  Resp: (!) 25 16  Temp: 99 F (37.2 C) 98 F (36.7 C)    Intake/Output Summary (Last 24 hours) at 12/14/16 0730 Last data filed at 12/14/16 0447  Gross per 24 hour  Intake              720 ml  Output             2326 ml  Net            -1606 ml   Right neck with moderate hematoma and slightly increased fullness from yesterday afternoon. Neck is soft.  Tongue midline. Equal strength upper and lower extremities bilaterally.   ASSESSMENT/PLAN:   76 y.o. male is s/p: right carotid endarterectomy 2 Days Post-Op   Hematoma essentially unchanged from yesterday afternoon. Tolerating diet. Walking without difficulty.  Neuro exam intact.  BP has improved. Systolic 100s.  Plan d/c today. Dr. Darrick PennaFields to see this am.   Raymond GurneyKimberly A Trinh 12/14/2016 7:30 AM --  Agree with above.  Some swelling on right side and ecchymosis.  Not tense.  Pt feels ok.  Some confusion but not far off baseline. D/c home  Fabienne Brunsharles Fields, MD [facility] [place] Office: [phone] Pager: [phone]  LABS:   CBC    Component Value Date/Time   WBC 9.5 12/13/2016 0322   HGB 9.7 (L) 12/13/2016 0322   HCT 31.4 (L) 12/13/2016 0322   PLT 215 12/13/2016 0322    BMET    Component Value Date/Time   NA 140 12/13/2016 0322   K 3.9 12/13/2016 0322   CL 106 12/13/2016 0322   CO2 27 12/13/2016 0322   GLUCOSE 101 (H) 12/13/2016 0322   BUN 9 12/13/2016 0322   CREATININE 1.10 12/13/2016 0322   CALCIUM 8.8 (L) 12/13/2016 0322   GFRNONAA >60 12/13/2016 0322   GFRAA >60 12/13/2016 0322    COAG Lab Results  Component Value Date   INR 1.14 12/07/2016   INR 1.19 07/25/2016   INR 1.26 06/02/2016   No results found for: PTT  ANTIBIOTICS:    Anti-infectives    Start     Dose/Rate Route Frequency Ordered Stop   12/12/16 2200  ciprofloxacin (CIPRO) tablet 500 mg     500 mg Oral 2 times daily 12/12/16 1904     12/12/16 2100  cefUROXime (ZINACEF) 1.5 g in dextrose 5 % 50 mL IVPB     1.5 g 100 mL/hr over 30 Minutes Intravenous Every 12 hours 12/12/16 1904 12/13/16 1127   12/12/16 0725  cefUROXime (ZINACEF) 1.5 g in dextrose 5 % 50 mL IVPB     1.5 g 100 mL/hr over 30 Minutes Intravenous 30 min pre-op 12/12/16 0725 12/12/16 0759   12/12/16 0600  dextrose 5 % with cefUROXime (ZINACEF) ADS Med    Comments:  Scronce, Trina   : cabinet override      12/12/16 0600 12/12/16 0729   12/12/16 0555  cefUROXime (ZINACEF) 1.5 g in dextrose 5 % 50 mL IVPB  Status:  Discontinued     1.5 g 100 mL/hr over 30 Minutes Intravenous 30 min pre-op 12/12/16 0555 12/12/16 0725       Maris BergerKimberly Trinh, PA-C [facility] Office: [phone] Pager: [phone] 12/14/2016 7:30 AM

## 2016-12-17 ENCOUNTER — Ambulatory Visit: Payer: Medicare Other

## 2016-12-17 ENCOUNTER — Encounter: Payer: Medicare Other | Admitting: Occupational Therapy

## 2016-12-17 ENCOUNTER — Encounter: Payer: Medicare Other | Admitting: Speech Pathology

## 2016-12-19 ENCOUNTER — Ambulatory Visit: Payer: Medicare Other | Admitting: Speech Pathology

## 2016-12-19 ENCOUNTER — Ambulatory Visit: Payer: Medicare Other

## 2016-12-19 ENCOUNTER — Ambulatory Visit: Payer: Medicare Other | Attending: Physical Medicine & Rehabilitation | Admitting: Occupational Therapy

## 2016-12-19 DIAGNOSIS — R262 Difficulty in walking, not elsewhere classified: Secondary | ICD-10-CM | POA: Insufficient documentation

## 2016-12-19 DIAGNOSIS — R41841 Cognitive communication deficit: Secondary | ICD-10-CM

## 2016-12-19 DIAGNOSIS — R278 Other lack of coordination: Secondary | ICD-10-CM | POA: Insufficient documentation

## 2016-12-19 DIAGNOSIS — R4189 Other symptoms and signs involving cognitive functions and awareness: Secondary | ICD-10-CM

## 2016-12-19 DIAGNOSIS — M6281 Muscle weakness (generalized): Secondary | ICD-10-CM | POA: Diagnosis present

## 2016-12-19 DIAGNOSIS — I69318 Other symptoms and signs involving cognitive functions following cerebral infarction: Secondary | ICD-10-CM | POA: Diagnosis present
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
## 2016-12-20 ENCOUNTER — Encounter: Payer: Medicare Other | Admitting: Occupational Therapy

## 2016-12-20 ENCOUNTER — Ambulatory Visit: Payer: Medicare Other

## 2016-12-20 ENCOUNTER — Encounter: Payer: Medicare Other | Admitting: Speech Pathology

## 2016-12-20 ENCOUNTER — Encounter: Payer: Self-pay | Admitting: Speech Pathology
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
## 2016-12-24 ENCOUNTER — Ambulatory Visit: Payer: Medicare Other

## 2016-12-24 ENCOUNTER — Encounter: Payer: Medicare Other | Admitting: Speech Pathology

## 2016-12-24 ENCOUNTER — Encounter: Payer: Medicare Other | Admitting: Occupational Therapy

## 2016-12-24 ENCOUNTER — Ambulatory Visit: Payer: Medicare Other | Admitting: Speech Pathology

## 2016-12-24 ENCOUNTER — Ambulatory Visit: Payer: Medicare Other | Admitting: Occupational Therapy

## 2016-12-24 DIAGNOSIS — M6281 Muscle weakness (generalized): Secondary | ICD-10-CM | POA: Diagnosis not present

## 2016-12-24 DIAGNOSIS — R278 Other lack of coordination: Secondary | ICD-10-CM

## 2016-12-24 DIAGNOSIS — R41841 Cognitive communication deficit: Secondary | ICD-10-CM

## 2016-12-24 DIAGNOSIS — R262 Difficulty in walking, not elsewhere classified: Secondary | ICD-10-CM

## 2016-12-24 DIAGNOSIS — I69318 Other symptoms and signs involving cognitive functions following cerebral infarction: Secondary | ICD-10-CM
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
## 2016-12-25 ENCOUNTER — Encounter: Payer: Self-pay | Admitting: Speech Pathology
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
## 2016-12-26 ENCOUNTER — Encounter: Payer: Medicare Other | Admitting: Speech Pathology

## 2016-12-26 ENCOUNTER — Encounter: Payer: Medicare Other | Admitting: Occupational Therapy

## 2016-12-26 ENCOUNTER — Ambulatory Visit: Payer: Medicare Other

## 2016-12-27 ENCOUNTER — Encounter: Payer: Self-pay | Admitting: Occupational Therapy

## 2016-12-27 ENCOUNTER — Ambulatory Visit: Payer: Medicare Other | Admitting: Speech Pathology

## 2016-12-27 ENCOUNTER — Ambulatory Visit: Payer: Medicare Other | Admitting: Occupational Therapy

## 2016-12-27 ENCOUNTER — Encounter: Payer: Self-pay | Admitting: Speech Pathology

## 2016-12-27 DIAGNOSIS — M6281 Muscle weakness (generalized): Secondary | ICD-10-CM

## 2016-12-27 DIAGNOSIS — R41841 Cognitive communication deficit: Secondary | ICD-10-CM

## 2016-12-27 DIAGNOSIS — R278 Other lack of coordination: Secondary | ICD-10-CM
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
## 2016-12-31 ENCOUNTER — Ambulatory Visit: Payer: Medicare Other

## 2016-12-31 ENCOUNTER — Encounter: Payer: Medicare Other | Admitting: Occupational Therapy

## 2016-12-31 ENCOUNTER — Encounter: Payer: Medicare Other | Admitting: Speech Pathology
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
## 2017-01-01 ENCOUNTER — Encounter: Payer: Self-pay | Admitting: Physical Therapy

## 2017-01-01 ENCOUNTER — Ambulatory Visit: Payer: Medicare Other | Admitting: Physical Therapy

## 2017-01-01 ENCOUNTER — Ambulatory Visit: Payer: Medicare Other | Admitting: Occupational Therapy

## 2017-01-01 ENCOUNTER — Ambulatory Visit: Payer: Medicare Other | Admitting: Speech Pathology

## 2017-01-01 DIAGNOSIS — R278 Other lack of coordination: Secondary | ICD-10-CM

## 2017-01-01 DIAGNOSIS — R262 Difficulty in walking, not elsewhere classified: Secondary | ICD-10-CM

## 2017-01-01 DIAGNOSIS — M6281 Muscle weakness (generalized): Secondary | ICD-10-CM | POA: Diagnosis not present

## 2017-01-01 DIAGNOSIS — R41841 Cognitive communication deficit: Secondary | ICD-10-CM
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
## 2017-01-02 ENCOUNTER — Encounter: Payer: Self-pay | Admitting: Speech Pathology
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
## 2017-01-03 ENCOUNTER — Ambulatory Visit: Payer: Medicare Other

## 2017-01-03 ENCOUNTER — Ambulatory Visit: Payer: Medicare Other | Admitting: Occupational Therapy

## 2017-01-03 ENCOUNTER — Encounter: Payer: Medicare Other | Admitting: Occupational Therapy

## 2017-01-03 ENCOUNTER — Encounter: Payer: Medicare Other | Admitting: Speech Pathology

## 2017-01-03 ENCOUNTER — Ambulatory Visit: Payer: Medicare Other | Admitting: Speech Pathology

## 2017-01-03 DIAGNOSIS — R278 Other lack of coordination: Secondary | ICD-10-CM

## 2017-01-03 DIAGNOSIS — R262 Difficulty in walking, not elsewhere classified: Secondary | ICD-10-CM

## 2017-01-03 DIAGNOSIS — R41841 Cognitive communication deficit: Secondary | ICD-10-CM

## 2017-01-03 DIAGNOSIS — M6281 Muscle weakness (generalized): Secondary | ICD-10-CM
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
## 2017-01-04 ENCOUNTER — Encounter: Payer: Self-pay | Admitting: Speech Pathology

## 2017-01-04 ENCOUNTER — Ambulatory Visit: Payer: Medicare Other | Admitting: Physical Medicine & Rehabilitation
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
## 2017-01-07 ENCOUNTER — Encounter: Payer: Medicare Other | Admitting: Speech Pathology

## 2017-01-07 ENCOUNTER — Ambulatory Visit: Payer: Medicare Other

## 2017-01-08 ENCOUNTER — Ambulatory Visit: Payer: Medicare Other

## 2017-01-08 ENCOUNTER — Ambulatory Visit: Payer: Medicare Other | Admitting: Speech Pathology

## 2017-01-08 DIAGNOSIS — R278 Other lack of coordination: Secondary | ICD-10-CM

## 2017-01-08 DIAGNOSIS — M6281 Muscle weakness (generalized): Secondary | ICD-10-CM | POA: Diagnosis not present

## 2017-01-08 DIAGNOSIS — R41841 Cognitive communication deficit: Secondary | ICD-10-CM

## 2017-01-08 DIAGNOSIS — R262 Difficulty in walking, not elsewhere classified: Secondary | ICD-10-CM
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
## 2017-01-09 ENCOUNTER — Encounter: Payer: Self-pay | Admitting: Speech Pathology
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
## 2017-01-10 ENCOUNTER — Encounter: Payer: Self-pay | Admitting: Neurology

## 2017-01-10 ENCOUNTER — Ambulatory Visit (INDEPENDENT_AMBULATORY_CARE_PROVIDER_SITE_OTHER): Payer: Medicare Other | Admitting: Neurology

## 2017-01-10 ENCOUNTER — Encounter: Payer: Medicare Other | Admitting: Speech Pathology

## 2017-01-10 ENCOUNTER — Ambulatory Visit: Payer: Medicare Other

## 2017-01-10 ENCOUNTER — Ambulatory Visit: Payer: Medicare Other | Admitting: Speech Pathology

## 2017-01-10 VITALS — BP 109/57 | HR 51 | Ht 70.5 in | Wt 170.0 lb

## 2017-01-10 DIAGNOSIS — G20 Parkinson's disease: Secondary | ICD-10-CM | POA: Diagnosis not present

## 2017-01-10 DIAGNOSIS — M6281 Muscle weakness (generalized): Secondary | ICD-10-CM

## 2017-01-10 DIAGNOSIS — R634 Abnormal weight loss: Secondary | ICD-10-CM | POA: Diagnosis not present

## 2017-01-10 DIAGNOSIS — R4189 Other symptoms and signs involving cognitive functions and awareness: Secondary | ICD-10-CM

## 2017-01-10 DIAGNOSIS — R278 Other lack of coordination: Secondary | ICD-10-CM

## 2017-01-10 DIAGNOSIS — R262 Difficulty in walking, not elsewhere classified: Secondary | ICD-10-CM

## 2017-01-10 DIAGNOSIS — R41841 Cognitive communication deficit: Secondary | ICD-10-CM

## 2017-01-10 MED ORDER — CARBIDOPA-LEVODOPA ER 50-200 MG PO TBCR: 1.0000 | EXTENDED_RELEASE_TABLET | Freq: Four times a day (QID) | ORAL | 3 refills | Status: DC
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
## 2017-01-10 NOTE — Patient Instructions (Addendum)
You are looking well, stable.  Please try to exercise regularly.  We will keep your Sinemet CR at 1 pill 4 times a day.  Please do not drive at this time.

## 2017-01-11 ENCOUNTER — Encounter: Payer: Self-pay | Admitting: Speech Pathology
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
## 2017-01-14 ENCOUNTER — Encounter: Payer: Medicare Other | Admitting: Speech Pathology

## 2017-01-14 ENCOUNTER — Encounter: Payer: Medicare Other | Admitting: Occupational Therapy

## 2017-01-14 ENCOUNTER — Ambulatory Visit: Payer: Medicare Other

## 2017-01-15 ENCOUNTER — Ambulatory Visit: Payer: Medicare Other | Admitting: Occupational Therapy

## 2017-01-15 ENCOUNTER — Ambulatory Visit: Payer: Medicare Other | Admitting: Speech Pathology

## 2017-01-15 ENCOUNTER — Ambulatory Visit: Payer: Medicare Other

## 2017-01-15 DIAGNOSIS — M6281 Muscle weakness (generalized): Secondary | ICD-10-CM

## 2017-01-15 DIAGNOSIS — R262 Difficulty in walking, not elsewhere classified: Secondary | ICD-10-CM

## 2017-01-15 DIAGNOSIS — R278 Other lack of coordination: Secondary | ICD-10-CM

## 2017-01-15 DIAGNOSIS — I69318 Other symptoms and signs involving cognitive functions following cerebral infarction: Secondary | ICD-10-CM

## 2017-01-15 DIAGNOSIS — R41841 Cognitive communication deficit: Secondary | ICD-10-CM
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
## 2017-01-16 ENCOUNTER — Encounter: Payer: Self-pay | Admitting: Speech Pathology
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
## 2017-01-17 ENCOUNTER — Ambulatory Visit: Payer: Medicare Other

## 2017-01-17 ENCOUNTER — Encounter: Payer: Medicare Other | Admitting: Speech Pathology

## 2017-01-17 ENCOUNTER — Ambulatory Visit: Payer: Medicare Other | Admitting: Speech Pathology

## 2017-01-17 ENCOUNTER — Encounter: Payer: Medicare Other | Admitting: Occupational Therapy

## 2017-01-17 DIAGNOSIS — M6281 Muscle weakness (generalized): Secondary | ICD-10-CM

## 2017-01-17 DIAGNOSIS — R262 Difficulty in walking, not elsewhere classified: Secondary | ICD-10-CM

## 2017-01-17 DIAGNOSIS — R278 Other lack of coordination: Secondary | ICD-10-CM

## 2017-01-17 DIAGNOSIS — R41841 Cognitive communication deficit: Secondary | ICD-10-CM
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
## 2017-01-18 ENCOUNTER — Encounter: Payer: Self-pay | Admitting: Speech Pathology
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
## 2017-01-22 ENCOUNTER — Ambulatory Visit: Payer: Medicare Other | Attending: Physical Medicine & Rehabilitation | Admitting: Occupational Therapy

## 2017-01-22 ENCOUNTER — Ambulatory Visit: Payer: Medicare Other | Admitting: Speech Pathology

## 2017-01-22 ENCOUNTER — Ambulatory Visit: Payer: Medicare Other

## 2017-01-22 DIAGNOSIS — R41841 Cognitive communication deficit: Secondary | ICD-10-CM | POA: Insufficient documentation

## 2017-01-22 DIAGNOSIS — R278 Other lack of coordination: Secondary | ICD-10-CM

## 2017-01-22 DIAGNOSIS — R262 Difficulty in walking, not elsewhere classified: Secondary | ICD-10-CM | POA: Insufficient documentation

## 2017-01-22 DIAGNOSIS — I69318 Other symptoms and signs involving cognitive functions following cerebral infarction: Secondary | ICD-10-CM | POA: Diagnosis present

## 2017-01-22 DIAGNOSIS — R4189 Other symptoms and signs involving cognitive functions and awareness: Secondary | ICD-10-CM | POA: Diagnosis present

## 2017-01-22 DIAGNOSIS — M6281 Muscle weakness (generalized): Secondary | ICD-10-CM | POA: Diagnosis present
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
## 2017-01-23 ENCOUNTER — Encounter: Payer: Self-pay | Admitting: Speech Pathology
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
## 2017-01-24 ENCOUNTER — Ambulatory Visit: Payer: Medicare Other | Admitting: Speech Pathology

## 2017-01-24 ENCOUNTER — Ambulatory Visit: Payer: Medicare Other | Admitting: Occupational Therapy

## 2017-01-24 ENCOUNTER — Ambulatory Visit: Payer: Medicare Other

## 2017-01-24 DIAGNOSIS — R278 Other lack of coordination: Secondary | ICD-10-CM

## 2017-01-24 DIAGNOSIS — R262 Difficulty in walking, not elsewhere classified: Secondary | ICD-10-CM

## 2017-01-24 DIAGNOSIS — M6281 Muscle weakness (generalized): Secondary | ICD-10-CM

## 2017-01-24 DIAGNOSIS — R41841 Cognitive communication deficit: Secondary | ICD-10-CM
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
## 2017-01-24 NOTE — Addendum Note (Signed)
Addended by: Claudie FishermanMOSER, Ravina Milner N on: 01/24/2017 04:31 PM   Modules accepted: Orders

## 2017-01-25 ENCOUNTER — Encounter: Payer: Self-pay | Admitting: Speech Pathology
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
## 2017-01-29 ENCOUNTER — Ambulatory Visit: Payer: Medicare Other | Admitting: Speech Pathology

## 2017-01-29 ENCOUNTER — Ambulatory Visit: Payer: Medicare Other | Admitting: Occupational Therapy

## 2017-01-29 ENCOUNTER — Ambulatory Visit: Payer: Medicare Other

## 2017-01-29 ENCOUNTER — Encounter: Payer: Self-pay | Admitting: Vascular Surgery

## 2017-01-29 ENCOUNTER — Encounter: Payer: Self-pay | Admitting: Speech Pathology

## 2017-01-29 DIAGNOSIS — M6281 Muscle weakness (generalized): Secondary | ICD-10-CM

## 2017-01-29 DIAGNOSIS — R262 Difficulty in walking, not elsewhere classified: Secondary | ICD-10-CM

## 2017-01-29 DIAGNOSIS — R278 Other lack of coordination: Secondary | ICD-10-CM

## 2017-01-29 DIAGNOSIS — R41841 Cognitive communication deficit: Secondary | ICD-10-CM
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
## 2017-01-31 ENCOUNTER — Ambulatory Visit: Payer: Medicare Other | Admitting: Occupational Therapy

## 2017-01-31 ENCOUNTER — Ambulatory Visit (INDEPENDENT_AMBULATORY_CARE_PROVIDER_SITE_OTHER): Payer: Self-pay | Admitting: Vascular Surgery

## 2017-01-31 ENCOUNTER — Ambulatory Visit: Payer: Medicare Other | Admitting: Speech Pathology

## 2017-01-31 ENCOUNTER — Encounter: Payer: Self-pay | Admitting: Vascular Surgery

## 2017-01-31 ENCOUNTER — Ambulatory Visit: Payer: Medicare Other

## 2017-01-31 VITALS — BP 112/61 | HR 53 | Temp 97.2°F | Ht 70.0 in | Wt 172.5 lb

## 2017-01-31 DIAGNOSIS — I714 Abdominal aortic aneurysm, without rupture, unspecified: Secondary | ICD-10-CM

## 2017-01-31 DIAGNOSIS — I6521 Occlusion and stenosis of right carotid artery: Secondary | ICD-10-CM

## 2017-01-31 NOTE — Progress Notes (Signed)
Patient is a 76 year old male who returns for follow-up today after recent right carotid endarterectomy. This was uneventful. He is on aspirin and Lipitor. 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 He still has some clumsiness of his left hand some gait instability and some cognitive disability. However, he also has Parkinson's disease. He has minimal left internal carotid artery disease. He also has a known 3.8 cm abdominal aortic aneurysm.  Current Outpatient Prescriptions on File Prior to Visit  Medication Sig Dispense Refill  . aspirin (ECOTRIN LOW STRENGTH) 81 MG EC tablet Take 81 mg by mouth daily. Swallow whole.    [name]. atorvastatin (LIPITOR) 20 MG tablet Take 20 mg by mouth once a week.     . carbidopa-levodopa (SINEMET CR) 50-200 MG tablet Take 1 tablet by mouth 4 (four) times daily. Take on a schedule of 0900, 1300, 1700 & 2100 360 tablet 3  . fluticasone (FLONASE) 50 MCG/ACT nasal spray Place 2 sprays into both nostrils 2 (two) times daily as needed for allergies or rhinitis.  2  . Hydrocortisone (GERHARDT'S BUTT CREAM) CREA Apply 1 application topically 3 (three) times daily. 1 each 0  . pantoprazole (PROTONIX) 40 MG tablet Take 1 tablet (40 mg total) by mouth daily. (Patient taking differently: Take 40 mg by mouth daily. (0900)) 30 tablet 0  . tamsulosin (FLOMAX) 0.4 MG CAPS capsule Take 1 capsule (0.4 mg total) by mouth daily after supper. 30 capsule 0  . Zinc Oxide (DIAPER RASH EX) Apply 1 application topically 3 (three) times daily as needed (for protection).     No current facility-administered medications on file prior to visit.    Review of systems: He denies shortness of breath. He denies chest pain. He states he is still continuing to recover neurologically. He is doing well with physical therapy and occupational therapy and improving dramatically.  Physical exam:  Vitals:   01/31/17 1305  BP: 112/61  Pulse: (!) 53  Temp: (!) 97.2 F (36.2 C)  TempSrc: Oral  SpO2: 100%   Weight: 172 lb 8 oz (78.2 kg)  Height: 5\' 10"  (1.778 m)    Neck: Well-healed right neck scar no carotid bruits  Chest: Clear to auscultation bilaterally  Cardiac: Regular rate and rhythm  Neuro: Symmetric upper extremity and lower extremity motor strength which is 5 over 5 and symmetric. No facial asymmetry.  Assessment: Doing well status post right carotid endarterectomy.  Plan: Patient needs a repeat carotid duplex exam in 6 months time. He will see our nurse practitioner with that office visit. We'll also need an ultrasound of his abdominal aorta probably in another year to make sure the aneurysm his abdomen has not progressed.  He will continue his aspirin and statin.  Fabienne Brunsharles Laster Appling, MD [facility] Office: [phone] Pager: [phone]

## 2017-02-04 ENCOUNTER — Ambulatory Visit: Payer: Medicare Other | Admitting: Physical Medicine & Rehabilitation

## 2017-02-05 ENCOUNTER — Ambulatory Visit: Payer: Medicare Other

## 2017-02-05 ENCOUNTER — Ambulatory Visit: Payer: Medicare Other | Admitting: Occupational Therapy

## 2017-02-05 ENCOUNTER — Ambulatory Visit: Payer: Medicare Other | Admitting: Speech Pathology

## 2017-02-05 DIAGNOSIS — R262 Difficulty in walking, not elsewhere classified: Secondary | ICD-10-CM

## 2017-02-05 DIAGNOSIS — M6281 Muscle weakness (generalized): Secondary | ICD-10-CM | POA: Diagnosis not present

## 2017-02-05 DIAGNOSIS — R278 Other lack of coordination: Secondary | ICD-10-CM

## 2017-02-05 DIAGNOSIS — R41841 Cognitive communication deficit: Secondary | ICD-10-CM
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
## 2017-02-05 NOTE — Patient Instructions (Signed)
[employer]
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
## 2017-02-06 ENCOUNTER — Encounter: Payer: Self-pay | Admitting: Speech Pathology

## 2017-02-06 DIAGNOSIS — M6281 Muscle weakness (generalized): Secondary | ICD-10-CM | POA: Diagnosis not present
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
## 2017-02-07 ENCOUNTER — Ambulatory Visit: Payer: Medicare Other | Admitting: Occupational Therapy

## 2017-02-07 ENCOUNTER — Encounter: Payer: Self-pay | Admitting: Speech Pathology

## 2017-02-07 ENCOUNTER — Ambulatory Visit: Payer: Medicare Other | Admitting: Speech Pathology

## 2017-02-07 ENCOUNTER — Ambulatory Visit: Payer: Medicare Other

## 2017-02-07 DIAGNOSIS — R278 Other lack of coordination: Secondary | ICD-10-CM

## 2017-02-07 DIAGNOSIS — R262 Difficulty in walking, not elsewhere classified: Secondary | ICD-10-CM

## 2017-02-07 DIAGNOSIS — M6281 Muscle weakness (generalized): Secondary | ICD-10-CM

## 2017-02-07 DIAGNOSIS — R41841 Cognitive communication deficit: Secondary | ICD-10-CM
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
## 2017-02-08 ENCOUNTER — Ambulatory Visit: Payer: Medicare Other | Admitting: Physical Medicine & Rehabilitation

## 2017-02-12 ENCOUNTER — Encounter: Payer: Self-pay | Admitting: Occupational Therapy

## 2017-02-12 ENCOUNTER — Ambulatory Visit: Payer: Medicare Other | Admitting: Speech Pathology

## 2017-02-12 ENCOUNTER — Ambulatory Visit: Payer: Medicare Other

## 2017-02-12 ENCOUNTER — Ambulatory Visit: Payer: Medicare Other | Admitting: Occupational Therapy

## 2017-02-12 ENCOUNTER — Encounter: Payer: Self-pay | Admitting: Speech Pathology

## 2017-02-12 DIAGNOSIS — M6281 Muscle weakness (generalized): Secondary | ICD-10-CM | POA: Diagnosis not present

## 2017-02-12 DIAGNOSIS — R41841 Cognitive communication deficit: Secondary | ICD-10-CM

## 2017-02-12 DIAGNOSIS — R4189 Other symptoms and signs involving cognitive functions and awareness: Secondary | ICD-10-CM
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
## 2017-02-14 ENCOUNTER — Ambulatory Visit: Payer: Medicare Other | Admitting: Occupational Therapy

## 2017-02-14 ENCOUNTER — Ambulatory Visit: Payer: Medicare Other

## 2017-02-14 ENCOUNTER — Encounter: Payer: Self-pay | Admitting: Occupational Therapy

## 2017-02-14 DIAGNOSIS — R278 Other lack of coordination: Secondary | ICD-10-CM

## 2017-02-14 DIAGNOSIS — M6281 Muscle weakness (generalized): Secondary | ICD-10-CM | POA: Diagnosis not present

## 2017-02-14 DIAGNOSIS — I69318 Other symptoms and signs involving cognitive functions following cerebral infarction: Secondary | ICD-10-CM

## 2017-02-14 DIAGNOSIS — R262 Difficulty in walking, not elsewhere classified: Secondary | ICD-10-CM
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
## 2017-02-19 ENCOUNTER — Ambulatory Visit: Payer: Medicare Other | Admitting: Occupational Therapy

## 2017-02-19 ENCOUNTER — Encounter: Payer: Self-pay | Admitting: Occupational Therapy

## 2017-02-19 ENCOUNTER — Ambulatory Visit: Payer: Medicare Other | Attending: Physical Medicine & Rehabilitation | Admitting: Speech Pathology

## 2017-02-19 ENCOUNTER — Encounter: Payer: Self-pay | Admitting: Speech Pathology

## 2017-02-19 ENCOUNTER — Ambulatory Visit: Payer: Medicare Other

## 2017-02-19 DIAGNOSIS — M6281 Muscle weakness (generalized): Secondary | ICD-10-CM

## 2017-02-19 DIAGNOSIS — I69315 Cognitive social or emotional deficit following cerebral infarction: Secondary | ICD-10-CM | POA: Insufficient documentation

## 2017-02-19 DIAGNOSIS — R41841 Cognitive communication deficit: Secondary | ICD-10-CM | POA: Diagnosis not present

## 2017-02-19 DIAGNOSIS — I69318 Other symptoms and signs involving cognitive functions following cerebral infarction: Secondary | ICD-10-CM | POA: Insufficient documentation

## 2017-02-19 DIAGNOSIS — R262 Difficulty in walking, not elsewhere classified: Secondary | ICD-10-CM | POA: Diagnosis present

## 2017-02-19 DIAGNOSIS — R278 Other lack of coordination: Secondary | ICD-10-CM
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
## 2017-02-21 ENCOUNTER — Ambulatory Visit: Payer: Medicare Other

## 2017-02-21 ENCOUNTER — Ambulatory Visit: Payer: Medicare Other | Admitting: Speech Pathology

## 2017-02-21 ENCOUNTER — Encounter: Payer: Self-pay | Admitting: Speech Pathology

## 2017-02-21 ENCOUNTER — Ambulatory Visit: Payer: Medicare Other | Admitting: Occupational Therapy

## 2017-02-21 DIAGNOSIS — I69315 Cognitive social or emotional deficit following cerebral infarction: Secondary | ICD-10-CM

## 2017-02-21 DIAGNOSIS — R262 Difficulty in walking, not elsewhere classified: Secondary | ICD-10-CM

## 2017-02-21 DIAGNOSIS — M6281 Muscle weakness (generalized): Secondary | ICD-10-CM

## 2017-02-21 DIAGNOSIS — R278 Other lack of coordination: Secondary | ICD-10-CM

## 2017-02-21 DIAGNOSIS — R41841 Cognitive communication deficit: Secondary | ICD-10-CM
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
## 2017-02-26 ENCOUNTER — Ambulatory Visit: Payer: Medicare Other

## 2017-02-26 ENCOUNTER — Encounter: Payer: Self-pay | Admitting: Speech Pathology

## 2017-02-26 ENCOUNTER — Ambulatory Visit: Payer: Medicare Other | Admitting: Speech Pathology

## 2017-02-26 ENCOUNTER — Ambulatory Visit: Payer: Medicare Other | Admitting: Occupational Therapy

## 2017-02-26 DIAGNOSIS — R41841 Cognitive communication deficit: Secondary | ICD-10-CM | POA: Diagnosis not present

## 2017-02-26 DIAGNOSIS — R262 Difficulty in walking, not elsewhere classified: Secondary | ICD-10-CM

## 2017-02-26 DIAGNOSIS — M6281 Muscle weakness (generalized): Secondary | ICD-10-CM

## 2017-02-26 DIAGNOSIS — R278 Other lack of coordination: Secondary | ICD-10-CM
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
## 2017-02-28 ENCOUNTER — Ambulatory Visit: Payer: Medicare Other | Admitting: Occupational Therapy

## 2017-02-28 ENCOUNTER — Ambulatory Visit: Payer: Medicare Other | Admitting: Physical Medicine & Rehabilitation

## 2017-02-28 ENCOUNTER — Ambulatory Visit: Payer: Medicare Other | Admitting: Speech Pathology

## 2017-02-28 ENCOUNTER — Ambulatory Visit: Payer: Medicare Other

## 2017-03-05 ENCOUNTER — Encounter: Payer: Self-pay | Admitting: Speech Pathology

## 2017-03-05 ENCOUNTER — Ambulatory Visit: Payer: Medicare Other

## 2017-03-05 ENCOUNTER — Encounter: Payer: Self-pay | Admitting: Occupational Therapy

## 2017-03-05 ENCOUNTER — Ambulatory Visit: Payer: Medicare Other | Admitting: Speech Pathology

## 2017-03-05 ENCOUNTER — Ambulatory Visit: Payer: Medicare Other | Admitting: Occupational Therapy

## 2017-03-05 DIAGNOSIS — M6281 Muscle weakness (generalized): Secondary | ICD-10-CM

## 2017-03-05 DIAGNOSIS — R278 Other lack of coordination: Secondary | ICD-10-CM

## 2017-03-05 DIAGNOSIS — R262 Difficulty in walking, not elsewhere classified: Secondary | ICD-10-CM

## 2017-03-05 DIAGNOSIS — R41841 Cognitive communication deficit: Secondary | ICD-10-CM
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
## 2017-03-07 ENCOUNTER — Ambulatory Visit: Payer: Medicare Other | Admitting: Occupational Therapy

## 2017-03-07 ENCOUNTER — Encounter: Payer: Medicare Other | Admitting: Speech Pathology

## 2017-03-07 ENCOUNTER — Ambulatory Visit: Payer: Medicare Other

## 2017-03-07 DIAGNOSIS — M6281 Muscle weakness (generalized): Secondary | ICD-10-CM

## 2017-03-07 DIAGNOSIS — R278 Other lack of coordination: Secondary | ICD-10-CM

## 2017-03-07 DIAGNOSIS — R41841 Cognitive communication deficit: Secondary | ICD-10-CM | POA: Diagnosis not present

## 2017-03-07 DIAGNOSIS — I69318 Other symptoms and signs involving cognitive functions following cerebral infarction: Secondary | ICD-10-CM

## 2017-03-07 DIAGNOSIS — R262 Difficulty in walking, not elsewhere classified: Secondary | ICD-10-CM
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
## 2017-03-11 ENCOUNTER — Ambulatory Visit (HOSPITAL_BASED_OUTPATIENT_CLINIC_OR_DEPARTMENT_OTHER): Payer: Medicare Other | Admitting: Physical Medicine & Rehabilitation

## 2017-03-11 ENCOUNTER — Encounter: Payer: Self-pay | Admitting: Physical Medicine & Rehabilitation

## 2017-03-11 ENCOUNTER — Encounter: Payer: Medicare Other | Attending: Physical Medicine & Rehabilitation

## 2017-03-11 VITALS — BP 101/65 | HR 70

## 2017-03-11 DIAGNOSIS — I69319 Unspecified symptoms and signs involving cognitive functions following cerebral infarction: Secondary | ICD-10-CM | POA: Diagnosis present

## 2017-03-11 DIAGNOSIS — R269 Unspecified abnormalities of gait and mobility: Secondary | ICD-10-CM

## 2017-03-11 DIAGNOSIS — I69398 Other sequelae of cerebral infarction: Secondary | ICD-10-CM | POA: Diagnosis not present
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
## 2017-03-11 NOTE — Patient Instructions (Signed)
May try Eucerin creme

## 2017-03-12 ENCOUNTER — Encounter: Payer: Self-pay | Admitting: Speech Pathology

## 2017-03-12 ENCOUNTER — Ambulatory Visit: Payer: Medicare Other | Admitting: Occupational Therapy

## 2017-03-12 ENCOUNTER — Encounter: Payer: Self-pay | Admitting: Occupational Therapy

## 2017-03-12 ENCOUNTER — Ambulatory Visit: Payer: Medicare Other

## 2017-03-12 ENCOUNTER — Ambulatory Visit: Payer: Medicare Other | Admitting: Speech Pathology

## 2017-03-12 DIAGNOSIS — M6281 Muscle weakness (generalized): Secondary | ICD-10-CM

## 2017-03-12 DIAGNOSIS — I69318 Other symptoms and signs involving cognitive functions following cerebral infarction: Secondary | ICD-10-CM

## 2017-03-12 DIAGNOSIS — R41841 Cognitive communication deficit: Secondary | ICD-10-CM | POA: Diagnosis not present

## 2017-03-12 DIAGNOSIS — R278 Other lack of coordination: Secondary | ICD-10-CM

## 2017-03-12 DIAGNOSIS — R262 Difficulty in walking, not elsewhere classified: Secondary | ICD-10-CM
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
## 2017-03-14 ENCOUNTER — Ambulatory Visit: Payer: Medicare Other | Admitting: Speech Pathology

## 2017-03-14 ENCOUNTER — Ambulatory Visit: Payer: Medicare Other

## 2017-03-14 ENCOUNTER — Ambulatory Visit: Payer: Medicare Other | Admitting: Occupational Therapy

## 2017-03-14 DIAGNOSIS — R41841 Cognitive communication deficit: Secondary | ICD-10-CM

## 2017-03-14 DIAGNOSIS — M6281 Muscle weakness (generalized): Secondary | ICD-10-CM

## 2017-03-14 DIAGNOSIS — I69318 Other symptoms and signs involving cognitive functions following cerebral infarction: Secondary | ICD-10-CM

## 2017-03-14 DIAGNOSIS — R262 Difficulty in walking, not elsewhere classified: Secondary | ICD-10-CM

## 2017-03-14 DIAGNOSIS — R278 Other lack of coordination: Secondary | ICD-10-CM
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
## 2017-03-15 ENCOUNTER — Encounter: Payer: Self-pay | Admitting: Speech Pathology
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
## 2017-03-19 ENCOUNTER — Encounter: Payer: Self-pay | Admitting: Occupational Therapy

## 2017-03-19 ENCOUNTER — Ambulatory Visit: Payer: Medicare Other | Admitting: Speech Pathology

## 2017-03-19 ENCOUNTER — Ambulatory Visit: Payer: Medicare Other | Admitting: Occupational Therapy

## 2017-03-19 ENCOUNTER — Ambulatory Visit: Payer: Medicare Other

## 2017-03-19 ENCOUNTER — Encounter: Payer: Self-pay | Admitting: Speech Pathology

## 2017-03-19 DIAGNOSIS — R278 Other lack of coordination: Secondary | ICD-10-CM

## 2017-03-19 DIAGNOSIS — R41841 Cognitive communication deficit: Secondary | ICD-10-CM

## 2017-03-19 DIAGNOSIS — M6281 Muscle weakness (generalized): Secondary | ICD-10-CM

## 2017-03-19 DIAGNOSIS — I69318 Other symptoms and signs involving cognitive functions following cerebral infarction: Secondary | ICD-10-CM

## 2017-03-19 DIAGNOSIS — R262 Difficulty in walking, not elsewhere classified: Secondary | ICD-10-CM
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
## 2017-03-21 ENCOUNTER — Encounter: Payer: Self-pay | Admitting: Speech Pathology

## 2017-03-21 ENCOUNTER — Ambulatory Visit: Payer: Medicare Other | Attending: Physical Medicine & Rehabilitation

## 2017-03-21 ENCOUNTER — Ambulatory Visit: Payer: Medicare Other | Admitting: Occupational Therapy

## 2017-03-21 ENCOUNTER — Ambulatory Visit: Payer: Medicare Other | Admitting: Speech Pathology

## 2017-03-21 DIAGNOSIS — I69315 Cognitive social or emotional deficit following cerebral infarction: Secondary | ICD-10-CM | POA: Diagnosis present

## 2017-03-21 DIAGNOSIS — M6281 Muscle weakness (generalized): Secondary | ICD-10-CM | POA: Insufficient documentation

## 2017-03-21 DIAGNOSIS — I69318 Other symptoms and signs involving cognitive functions following cerebral infarction: Secondary | ICD-10-CM | POA: Insufficient documentation

## 2017-03-21 DIAGNOSIS — R262 Difficulty in walking, not elsewhere classified: Secondary | ICD-10-CM | POA: Diagnosis present

## 2017-03-21 DIAGNOSIS — R278 Other lack of coordination: Secondary | ICD-10-CM | POA: Diagnosis present

## 2017-03-21 DIAGNOSIS — R41841 Cognitive communication deficit: Secondary | ICD-10-CM
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
## 2017-03-26 ENCOUNTER — Ambulatory Visit: Payer: Medicare Other

## 2017-03-26 ENCOUNTER — Ambulatory Visit: Payer: Medicare Other | Admitting: Speech Pathology

## 2017-03-26 ENCOUNTER — Encounter: Payer: Self-pay | Admitting: Speech Pathology

## 2017-03-26 ENCOUNTER — Ambulatory Visit: Payer: Medicare Other | Admitting: Occupational Therapy

## 2017-03-26 DIAGNOSIS — R278 Other lack of coordination: Secondary | ICD-10-CM

## 2017-03-26 DIAGNOSIS — M6281 Muscle weakness (generalized): Secondary | ICD-10-CM

## 2017-03-26 DIAGNOSIS — R262 Difficulty in walking, not elsewhere classified: Secondary | ICD-10-CM

## 2017-03-26 DIAGNOSIS — R41841 Cognitive communication deficit: Secondary | ICD-10-CM
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
## 2017-03-28 ENCOUNTER — Ambulatory Visit: Payer: Medicare Other | Admitting: Physical Therapy

## 2017-03-28 ENCOUNTER — Other Ambulatory Visit: Payer: Self-pay

## 2017-03-28 ENCOUNTER — Encounter: Payer: Self-pay | Admitting: Speech Pathology

## 2017-03-28 ENCOUNTER — Encounter: Payer: Self-pay | Admitting: Occupational Therapy

## 2017-03-28 ENCOUNTER — Encounter: Payer: Self-pay | Admitting: Physical Therapy

## 2017-03-28 ENCOUNTER — Ambulatory Visit: Payer: Medicare Other | Admitting: Occupational Therapy

## 2017-03-28 ENCOUNTER — Ambulatory Visit: Payer: Medicare Other | Admitting: Speech Pathology

## 2017-03-28 VITALS — BP 96/45 | HR 51

## 2017-03-28 DIAGNOSIS — M6281 Muscle weakness (generalized): Secondary | ICD-10-CM

## 2017-03-28 DIAGNOSIS — R278 Other lack of coordination: Secondary | ICD-10-CM

## 2017-03-28 DIAGNOSIS — I69318 Other symptoms and signs involving cognitive functions following cerebral infarction: Secondary | ICD-10-CM

## 2017-03-28 DIAGNOSIS — R262 Difficulty in walking, not elsewhere classified: Secondary | ICD-10-CM

## 2017-03-28 DIAGNOSIS — R41841 Cognitive communication deficit: Secondary | ICD-10-CM
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
## 2017-04-02 ENCOUNTER — Ambulatory Visit: Payer: Medicare Other | Admitting: Occupational Therapy

## 2017-04-02 ENCOUNTER — Ambulatory Visit: Payer: Medicare Other

## 2017-04-02 ENCOUNTER — Encounter: Payer: Self-pay | Admitting: Occupational Therapy

## 2017-04-02 ENCOUNTER — Ambulatory Visit: Payer: Medicare Other | Admitting: Speech Pathology

## 2017-04-02 ENCOUNTER — Encounter: Payer: Self-pay | Admitting: Speech Pathology

## 2017-04-02 DIAGNOSIS — R262 Difficulty in walking, not elsewhere classified: Secondary | ICD-10-CM

## 2017-04-02 DIAGNOSIS — R278 Other lack of coordination: Secondary | ICD-10-CM

## 2017-04-02 DIAGNOSIS — M6281 Muscle weakness (generalized): Secondary | ICD-10-CM

## 2017-04-02 DIAGNOSIS — I69318 Other symptoms and signs involving cognitive functions following cerebral infarction: Secondary | ICD-10-CM

## 2017-04-02 DIAGNOSIS — R41841 Cognitive communication deficit: Secondary | ICD-10-CM
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
## 2017-04-03 ENCOUNTER — Ambulatory Visit: Payer: Self-pay | Admitting: Urology

## 2017-04-04 ENCOUNTER — Ambulatory Visit: Payer: Medicare Other

## 2017-04-04 ENCOUNTER — Ambulatory Visit: Payer: Medicare Other | Admitting: Occupational Therapy

## 2017-04-04 ENCOUNTER — Ambulatory Visit: Payer: Medicare Other | Admitting: Speech Pathology

## 2017-04-09 ENCOUNTER — Ambulatory Visit: Payer: Medicare Other

## 2017-04-09 ENCOUNTER — Ambulatory Visit: Payer: Medicare Other | Admitting: Occupational Therapy

## 2017-04-09 ENCOUNTER — Ambulatory Visit: Payer: Medicare Other | Admitting: Speech Pathology

## 2017-04-09 DIAGNOSIS — M6281 Muscle weakness (generalized): Secondary | ICD-10-CM

## 2017-04-09 DIAGNOSIS — R41841 Cognitive communication deficit: Secondary | ICD-10-CM

## 2017-04-09 DIAGNOSIS — R278 Other lack of coordination: Secondary | ICD-10-CM

## 2017-04-09 DIAGNOSIS — R262 Difficulty in walking, not elsewhere classified: Secondary | ICD-10-CM

## 2017-04-09 DIAGNOSIS — I69318 Other symptoms and signs involving cognitive functions following cerebral infarction: Secondary | ICD-10-CM
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
## 2017-04-10 ENCOUNTER — Other Ambulatory Visit: Payer: Self-pay

## 2017-04-10 ENCOUNTER — Encounter: Payer: Self-pay | Admitting: Speech Pathology
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
## 2017-04-16 ENCOUNTER — Encounter: Payer: Self-pay | Admitting: Speech Pathology

## 2017-04-16 ENCOUNTER — Ambulatory Visit: Payer: Medicare Other | Admitting: Speech Pathology

## 2017-04-16 ENCOUNTER — Ambulatory Visit: Payer: Medicare Other

## 2017-04-16 ENCOUNTER — Encounter: Payer: Self-pay | Admitting: Occupational Therapy

## 2017-04-16 ENCOUNTER — Ambulatory Visit: Payer: Medicare Other | Admitting: Occupational Therapy

## 2017-04-16 DIAGNOSIS — M6281 Muscle weakness (generalized): Secondary | ICD-10-CM

## 2017-04-16 DIAGNOSIS — R278 Other lack of coordination: Secondary | ICD-10-CM

## 2017-04-16 DIAGNOSIS — R262 Difficulty in walking, not elsewhere classified: Secondary | ICD-10-CM

## 2017-04-16 DIAGNOSIS — R41841 Cognitive communication deficit: Secondary | ICD-10-CM

## 2017-04-16 DIAGNOSIS — I69318 Other symptoms and signs involving cognitive functions following cerebral infarction: Secondary | ICD-10-CM
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
## 2017-04-18 ENCOUNTER — Ambulatory Visit: Payer: Medicare Other

## 2017-04-18 ENCOUNTER — Ambulatory Visit: Payer: Medicare Other | Admitting: Occupational Therapy

## 2017-04-18 ENCOUNTER — Ambulatory Visit: Payer: Medicare Other | Admitting: Speech Pathology

## 2017-04-18 ENCOUNTER — Encounter: Payer: Self-pay | Admitting: Occupational Therapy

## 2017-04-18 DIAGNOSIS — M6281 Muscle weakness (generalized): Secondary | ICD-10-CM | POA: Diagnosis not present

## 2017-04-18 DIAGNOSIS — R262 Difficulty in walking, not elsewhere classified: Secondary | ICD-10-CM

## 2017-04-18 DIAGNOSIS — I69318 Other symptoms and signs involving cognitive functions following cerebral infarction: Secondary | ICD-10-CM

## 2017-04-18 DIAGNOSIS — R278 Other lack of coordination: Secondary | ICD-10-CM

## 2017-04-18 DIAGNOSIS — R41841 Cognitive communication deficit: Secondary | ICD-10-CM
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
## 2017-04-18 NOTE — Patient Instructions (Signed)
1. Power over Parkinson's -free a. For information, call Amy Bernie CoveyMarriott or Willa Fraterngela Freeman at [phone]. b. A group to empower people recently diagnosed with Parkinson's disease to live better through education and exercise. c. This group is designed to help people recently diagnosed with Parkinson's disease learn how to empower themselves to live better. This group provides information about how Parkinson's disease affects you as an individual, how to proactively take control of Parkinson's disease, steps to manage your Parkinson's disease, along with education and exercise for lifelong activity. d. This group meets on the third Tuesday of each month from 4 to 5 p.m. at [facility] in Classrooms 1 and 2 2. [employer] Parkinson's Disease Exercise class a.  b. This 8-week therapist-led exercise program will promote healthy exercise habits in order to slow the effects of Parkinson's disease. c. Registration Details i. To register, call Lonia Bloodmy Marriott, PT, at [phone]. ii. Registration should be completed and payment must be received one week prior to the first class of each 8-week session Regular exercise can deter the effects of Parkinson's disease, especially during the early and moderate stages of Parkinson's Disease (PD). The [facility] is actively involved in community education and support related to Parkinson's disease. The [employer] MOVES PD Exercise Class will: . Promote healthy exercise habits in order to slow the effects of PD and is targeted to individuals with minimal to moderate levels of Parkinson's Disease severity in the [place] area. . Maximize mobility, function and quality of life for individuals with PD. [name]. Be geared towards managing posture, strength, balance and flexibility with use of research-based exercise activities.  Participants will engage in a variety of activities that target the effects of Parkinson's Disease during  each of the classes. This 8-week exercise program will meet on: . Wednesdays, from 1 to 2 p.m. . Wednesdays, from 2:15-3:15 p.m. Classes are held in the Exercise Room at [facility], located at [address]. For information about when the next eight-week series will begin, call [phone].  d.  e. Fees & Payment i. Cost is $80 per 8-week session.  ii. Payment must be received one week prior to the first class of each 8-week session. 3. Parkinson's Cycle Class at [facility] This is an ongoing class on Monday and Thursday mornings at 10:45 a.m. A healthcare provider referral is required to enroll. This class is FREE to participants, and you do not have to be a member of the [facility] to enroll. Contact Beth at [phone] or [email or web address]. 4. Parkinson's Cycle Class at [facility] a. Ongoing Class Monday, Wednesday, and Friday mornings at 9:00 a.m. A healthcare provider referral is required to enroll. This class is FREE to participants, and you do not have to be a member of the [facility] to enroll. Contact Marlee at [phone] or [email or web address]. 5. [employer] [place] a. [employer] enables people with Parkinson's disease to fight their disease by providing non-contact, boxing-style fitness programs that improve their quality of life and sense of efficacy and self-worth. Classes are offered Mondays at 5:15 p.m. and Tuesdays and Thursdays at 12 p.m. at [employer] [facility]. For more information, contact [phone] or visit [email or web address] or [email or web address] 6. [employer] [place] a. This class is offered Monday, Wednesday, and Friday from 9:30 a.m. - 11:00 a.m. For more information, contact [phone] or [phone] or email [email or web address] or visit [email or web address] or [email or web address] [employer]  Classes  are offered at two locations: 7. [facility] [employer] [facility] in [place] (for more information, contact Thad Stovall at [phone] or email [email or web address] 8. [place] [facility] (class is open to the public -- for more information, contact Arn MedalMichael Cain at [phone] or email [email or web address]). [employer] 9. Offers certified personal training to customize a program to meet your exercise needs to address Parkinson's disease. For more information, contact [phone] or visit [email or web address].  [employer]ne[employer] [email or web address] insurance covered gym programs and/or classes for both of you!

## 2017-04-19 ENCOUNTER — Encounter: Payer: Self-pay | Admitting: Speech Pathology
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
## 2017-04-22 ENCOUNTER — Ambulatory Visit: Payer: Medicare Other | Admitting: Occupational Therapy

## 2017-04-22 DIAGNOSIS — R262 Difficulty in walking, not elsewhere classified: Secondary | ICD-10-CM | POA: Diagnosis present

## 2017-04-22 DIAGNOSIS — R2689 Other abnormalities of gait and mobility: Secondary | ICD-10-CM | POA: Diagnosis present

## 2017-04-22 DIAGNOSIS — R278 Other lack of coordination: Secondary | ICD-10-CM | POA: Diagnosis present

## 2017-04-22 DIAGNOSIS — M6281 Muscle weakness (generalized): Secondary | ICD-10-CM | POA: Diagnosis not present

## 2017-04-22 NOTE — Addendum Note (Signed)
Addended by: Avon GullyJAGENTENFL, Poppy Mcafee M on: 04/22/2017 09:10 AM   Modules accepted: Orders

## 2017-04-24 ENCOUNTER — Ambulatory Visit: Payer: Medicare Other | Attending: Cardiology | Admitting: Occupational Therapy

## 2017-04-24 ENCOUNTER — Encounter: Payer: Self-pay | Admitting: Occupational Therapy

## 2017-04-24 ENCOUNTER — Ambulatory Visit: Payer: Medicare Other

## 2017-04-24 DIAGNOSIS — M6281 Muscle weakness (generalized): Secondary | ICD-10-CM

## 2017-04-24 DIAGNOSIS — R278 Other lack of coordination: Secondary | ICD-10-CM

## 2017-04-24 DIAGNOSIS — R2689 Other abnormalities of gait and mobility: Secondary | ICD-10-CM
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
## 2017-04-29 ENCOUNTER — Encounter: Payer: Medicare Other | Admitting: Occupational Therapy

## 2017-05-01 ENCOUNTER — Encounter: Payer: Medicare Other | Admitting: Occupational Therapy

## 2017-05-01 ENCOUNTER — Ambulatory Visit: Payer: Medicare Other | Admitting: Physical Therapy

## 2017-05-07 ENCOUNTER — Ambulatory Visit (INDEPENDENT_AMBULATORY_CARE_PROVIDER_SITE_OTHER): Payer: Medicare Other | Admitting: Neurology

## 2017-05-07 ENCOUNTER — Encounter: Payer: Self-pay | Admitting: Neurology

## 2017-05-07 VITALS — BP 109/88 | HR 71 | Ht 70.0 in | Wt 181.0 lb

## 2017-05-07 DIAGNOSIS — G20 Parkinson's disease: Secondary | ICD-10-CM

## 2017-05-07 DIAGNOSIS — R4189 Other symptoms and signs involving cognitive functions and awareness: Secondary | ICD-10-CM | POA: Diagnosis not present

## 2017-05-07 DIAGNOSIS — I63411 Cerebral infarction due to embolism of right middle cerebral artery: Secondary | ICD-10-CM

## 2017-05-07 NOTE — Patient Instructions (Addendum)
We will keep your Sinemet CR the same.  Please try to stay active mentally.  Use your walker at all times, stay well hydrated.  You are stable.  Try to walk and use your stationary bike.  We will monitor your memory. You have just started thyroid medication.
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
## 2017-05-08 ENCOUNTER — Encounter: Payer: Self-pay | Admitting: Occupational Therapy

## 2017-05-08 ENCOUNTER — Ambulatory Visit: Payer: Medicare Other | Admitting: Occupational Therapy

## 2017-05-08 ENCOUNTER — Ambulatory Visit: Payer: Medicare Other | Admitting: Physical Therapy

## 2017-05-08 ENCOUNTER — Encounter: Payer: Self-pay | Admitting: Physical Therapy

## 2017-05-08 DIAGNOSIS — M6281 Muscle weakness (generalized): Secondary | ICD-10-CM

## 2017-05-08 DIAGNOSIS — R278 Other lack of coordination: Secondary | ICD-10-CM

## 2017-05-08 DIAGNOSIS — R2689 Other abnormalities of gait and mobility: Secondary | ICD-10-CM

## 2017-05-08 DIAGNOSIS — R262 Difficulty in walking, not elsewhere classified: Secondary | ICD-10-CM
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
## 2017-05-09 ENCOUNTER — Ambulatory Visit (INDEPENDENT_AMBULATORY_CARE_PROVIDER_SITE_OTHER): Payer: Medicare Other | Admitting: Urology

## 2017-05-09 ENCOUNTER — Encounter: Payer: Self-pay | Admitting: Urology

## 2017-05-09 VITALS — BP 103/65 | HR 74 | Ht 70.0 in | Wt 184.0 lb

## 2017-05-09 DIAGNOSIS — N3941 Urge incontinence: Secondary | ICD-10-CM

## 2017-05-09 DIAGNOSIS — R350 Frequency of micturition: Secondary | ICD-10-CM | POA: Diagnosis not present

## 2017-05-09 DIAGNOSIS — Z87898 Personal history of other specified conditions: Secondary | ICD-10-CM | POA: Diagnosis not present

## 2017-05-09 DIAGNOSIS — R339 Retention of urine, unspecified: Secondary | ICD-10-CM

## 2017-05-09 LAB — MICROSCOPIC EXAMINATION: Epithelial Cells (non renal): NONE SEEN /hpf (ref 0–10)

## 2017-05-09 LAB — URINALYSIS, COMPLETE
BILIRUBIN UA: NEGATIVE
GLUCOSE, UA: NEGATIVE
Nitrite, UA: NEGATIVE
Protein, UA: NEGATIVE
Specific Gravity, UA: 1.02 (ref 1.005–1.030)
Urobilinogen, Ur: 1 mg/dL (ref 0.2–1.0)
pH, UA: 5.5 (ref 5.0–7.5)

## 2017-05-09 LAB — BLADDER SCAN AMB NON-IMAGING: Scan Result: 70

## 2017-05-12 LAB — CULTURE, URINE COMPREHENSIVE

## 2017-05-13 DIAGNOSIS — Z87898 Personal history of other specified conditions: Secondary | ICD-10-CM | POA: Insufficient documentation

## 2017-05-13 DIAGNOSIS — N3941 Urge incontinence: Secondary | ICD-10-CM | POA: Insufficient documentation
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
## 2017-05-15 ENCOUNTER — Encounter: Payer: Medicare Other | Admitting: Occupational Therapy

## 2017-05-22 ENCOUNTER — Ambulatory Visit: Payer: Medicare Other | Attending: Cardiology | Admitting: Physical Therapy

## 2017-05-22 ENCOUNTER — Encounter: Payer: Self-pay | Admitting: Occupational Therapy

## 2017-05-22 ENCOUNTER — Ambulatory Visit: Payer: Medicare Other | Admitting: Occupational Therapy

## 2017-05-22 ENCOUNTER — Encounter: Payer: Self-pay | Admitting: Physical Therapy

## 2017-05-22 DIAGNOSIS — M6281 Muscle weakness (generalized): Secondary | ICD-10-CM | POA: Diagnosis not present

## 2017-05-22 DIAGNOSIS — R262 Difficulty in walking, not elsewhere classified: Secondary | ICD-10-CM | POA: Diagnosis present

## 2017-05-22 DIAGNOSIS — R278 Other lack of coordination: Secondary | ICD-10-CM | POA: Diagnosis present

## 2017-05-22 DIAGNOSIS — R2689 Other abnormalities of gait and mobility: Secondary | ICD-10-CM | POA: Insufficient documentation

## 2017-05-22 DIAGNOSIS — R41841 Cognitive communication deficit: Secondary | ICD-10-CM | POA: Insufficient documentation
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
## 2017-05-27 ENCOUNTER — Ambulatory Visit: Payer: Medicare Other

## 2017-05-27 ENCOUNTER — Ambulatory Visit: Payer: Medicare Other | Admitting: Occupational Therapy

## 2017-05-27 ENCOUNTER — Ambulatory Visit: Payer: Medicare Other | Admitting: Speech Pathology

## 2017-05-27 ENCOUNTER — Encounter: Payer: Self-pay | Admitting: Occupational Therapy

## 2017-05-27 DIAGNOSIS — M6281 Muscle weakness (generalized): Secondary | ICD-10-CM

## 2017-05-27 DIAGNOSIS — R41841 Cognitive communication deficit: Secondary | ICD-10-CM

## 2017-05-27 DIAGNOSIS — R278 Other lack of coordination: Secondary | ICD-10-CM

## 2017-05-27 DIAGNOSIS — R2689 Other abnormalities of gait and mobility: Secondary | ICD-10-CM
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
## 2017-05-28 ENCOUNTER — Encounter: Payer: Self-pay | Admitting: Speech Pathology

## 2017-05-28 ENCOUNTER — Other Ambulatory Visit: Payer: Self-pay
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
## 2017-05-29 ENCOUNTER — Encounter: Payer: Self-pay | Admitting: Occupational Therapy

## 2017-05-29 ENCOUNTER — Ambulatory Visit: Payer: Medicare Other | Admitting: Occupational Therapy

## 2017-05-29 ENCOUNTER — Ambulatory Visit: Payer: Medicare Other | Admitting: Speech Pathology

## 2017-05-29 ENCOUNTER — Ambulatory Visit: Payer: Medicare Other

## 2017-05-29 DIAGNOSIS — R41841 Cognitive communication deficit: Secondary | ICD-10-CM

## 2017-05-29 DIAGNOSIS — M6281 Muscle weakness (generalized): Secondary | ICD-10-CM

## 2017-05-29 DIAGNOSIS — R278 Other lack of coordination: Secondary | ICD-10-CM

## 2017-05-29 DIAGNOSIS — R2689 Other abnormalities of gait and mobility: Secondary | ICD-10-CM
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
## 2017-05-30 ENCOUNTER — Encounter: Payer: Self-pay | Admitting: Speech Pathology

## 2017-05-30 ENCOUNTER — Other Ambulatory Visit: Payer: Self-pay
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
## 2017-06-03 ENCOUNTER — Ambulatory Visit: Payer: Medicare Other | Admitting: Occupational Therapy

## 2017-06-03 ENCOUNTER — Ambulatory Visit: Payer: Medicare Other | Admitting: Speech Pathology

## 2017-06-03 ENCOUNTER — Ambulatory Visit: Payer: Medicare Other

## 2017-06-03 ENCOUNTER — Encounter: Payer: Self-pay | Admitting: Occupational Therapy

## 2017-06-03 DIAGNOSIS — M6281 Muscle weakness (generalized): Secondary | ICD-10-CM

## 2017-06-03 DIAGNOSIS — R278 Other lack of coordination: Secondary | ICD-10-CM

## 2017-06-03 DIAGNOSIS — R41841 Cognitive communication deficit: Secondary | ICD-10-CM
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
## 2017-06-04 ENCOUNTER — Encounter: Payer: Self-pay | Admitting: Speech Pathology
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
## 2017-06-05 ENCOUNTER — Ambulatory Visit: Payer: Medicare Other | Admitting: Speech Pathology

## 2017-06-05 ENCOUNTER — Encounter: Payer: Self-pay | Admitting: Occupational Therapy

## 2017-06-05 ENCOUNTER — Ambulatory Visit: Payer: Medicare Other

## 2017-06-05 ENCOUNTER — Ambulatory Visit: Payer: Medicare Other | Admitting: Occupational Therapy

## 2017-06-05 DIAGNOSIS — M6281 Muscle weakness (generalized): Secondary | ICD-10-CM | POA: Diagnosis not present

## 2017-06-05 DIAGNOSIS — R41841 Cognitive communication deficit: Secondary | ICD-10-CM
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
## 2017-06-06 ENCOUNTER — Encounter: Payer: Self-pay | Admitting: Speech Pathology
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
## 2017-06-10 ENCOUNTER — Encounter: Payer: Self-pay | Admitting: Occupational Therapy

## 2017-06-10 ENCOUNTER — Ambulatory Visit: Payer: Medicare Other

## 2017-06-10 ENCOUNTER — Ambulatory Visit: Payer: Medicare Other | Admitting: Speech Pathology

## 2017-06-10 ENCOUNTER — Ambulatory Visit: Payer: Medicare Other | Admitting: Occupational Therapy

## 2017-06-10 DIAGNOSIS — R278 Other lack of coordination: Secondary | ICD-10-CM

## 2017-06-10 DIAGNOSIS — R41841 Cognitive communication deficit: Secondary | ICD-10-CM

## 2017-06-10 DIAGNOSIS — M6281 Muscle weakness (generalized): Secondary | ICD-10-CM | POA: Diagnosis not present
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
## 2017-06-11 ENCOUNTER — Encounter: Payer: Self-pay | Admitting: Speech Pathology
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
## 2017-06-12 ENCOUNTER — Ambulatory Visit: Payer: Medicare Other

## 2017-06-12 ENCOUNTER — Encounter: Payer: Self-pay | Admitting: Occupational Therapy

## 2017-06-12 ENCOUNTER — Ambulatory Visit: Payer: Medicare Other | Admitting: Occupational Therapy

## 2017-06-12 ENCOUNTER — Ambulatory Visit: Payer: Medicare Other | Admitting: Speech Pathology

## 2017-06-12 DIAGNOSIS — M6281 Muscle weakness (generalized): Secondary | ICD-10-CM

## 2017-06-12 DIAGNOSIS — R41841 Cognitive communication deficit: Secondary | ICD-10-CM

## 2017-06-12 DIAGNOSIS — R278 Other lack of coordination: Secondary | ICD-10-CM
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
## 2017-06-13 ENCOUNTER — Encounter: Payer: Self-pay | Admitting: Speech Pathology
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
## 2017-06-17 ENCOUNTER — Encounter: Payer: Self-pay | Admitting: Occupational Therapy

## 2017-06-17 ENCOUNTER — Ambulatory Visit: Payer: Medicare Other

## 2017-06-17 ENCOUNTER — Ambulatory Visit: Payer: Medicare Other | Admitting: Speech Pathology

## 2017-06-17 ENCOUNTER — Ambulatory Visit: Payer: Medicare Other | Admitting: Occupational Therapy

## 2017-06-17 DIAGNOSIS — R278 Other lack of coordination: Secondary | ICD-10-CM

## 2017-06-17 DIAGNOSIS — M6281 Muscle weakness (generalized): Secondary | ICD-10-CM
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
## 2017-06-19 ENCOUNTER — Encounter: Payer: Self-pay | Admitting: Occupational Therapy

## 2017-06-19 ENCOUNTER — Ambulatory Visit: Payer: Medicare Other | Admitting: Speech Pathology

## 2017-06-19 ENCOUNTER — Ambulatory Visit: Payer: Medicare Other | Admitting: Occupational Therapy

## 2017-06-19 ENCOUNTER — Ambulatory Visit: Payer: Medicare Other

## 2017-06-19 DIAGNOSIS — R278 Other lack of coordination: Secondary | ICD-10-CM

## 2017-06-19 DIAGNOSIS — M6281 Muscle weakness (generalized): Secondary | ICD-10-CM

## 2017-06-19 DIAGNOSIS — R41841 Cognitive communication deficit: Secondary | ICD-10-CM
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
## 2017-06-20 ENCOUNTER — Encounter: Payer: Self-pay | Admitting: Speech Pathology
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
## 2017-06-23 IMAGING — CR DG CHEST 1V PORT
1 series · 1 of 1 positions shown · non-contrast
Comparison: July 04, 2016

CLINICAL DATA: Hypoxia

EXAM:
PORTABLE CHEST 1 VIEW

[AP]
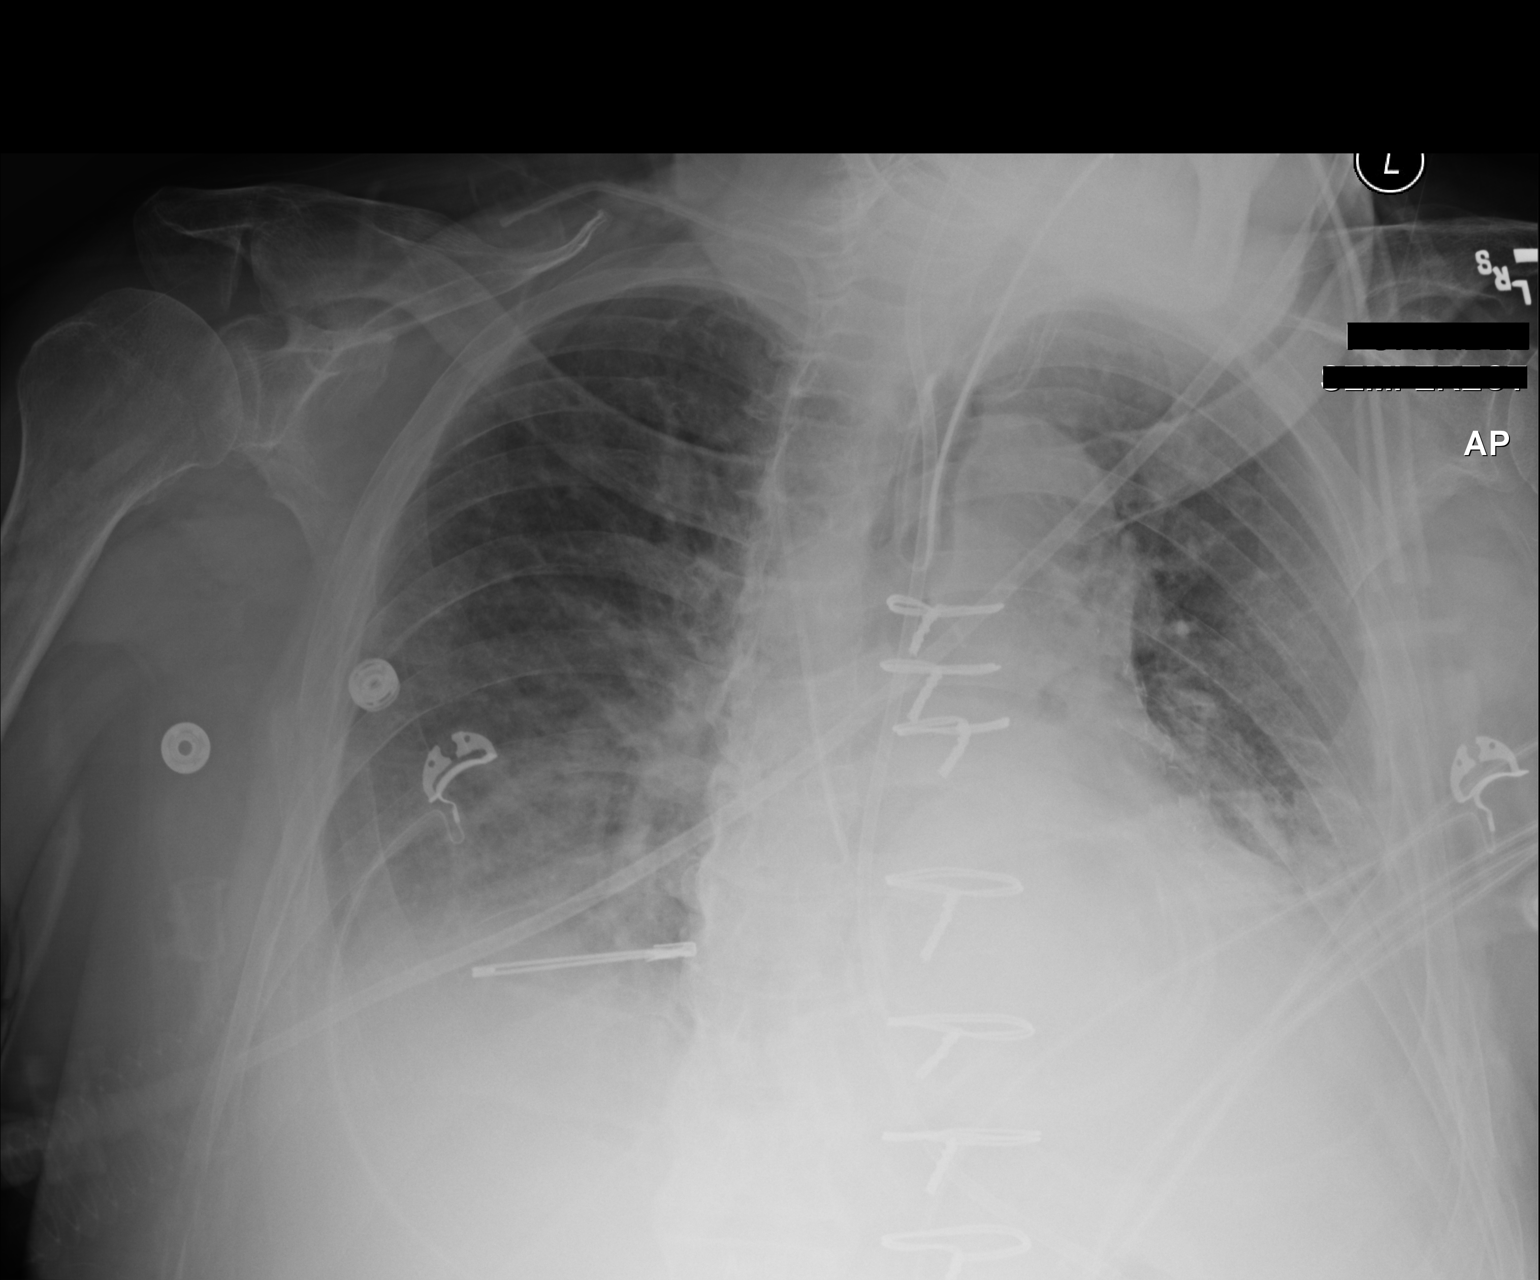

[1 of 1 positions shown; findings below may reference images not displayed]

FINDINGS: Endotracheal tube tip is 2.1 cm above the carina. Central catheter
tip is in the superior vena cava near the cavoatrial junction.
Enteric tube tip is below the diaphragm. An apparent safety pin is
again seen to overlie the right lower hemithorax. No evident
pneumothorax. There is mild interstitial edema with bilateral
pleural effusions. There is patchy alveolar opacity in the left
base. There is cardiomegaly with mild pulmonary venous hypertension.
Patient is status post internal mammary bypass grafting. No bone
lesions.
IMPRESSION: Tube and catheter positions as described without pneumothorax.
Findings indicative of a degree of congestive heart failure.
Superimposed pneumonia in the left base cannot be excluded
radiographically.

## 2017-06-24 ENCOUNTER — Ambulatory Visit: Payer: Medicare Other | Attending: Cardiology | Admitting: Occupational Therapy

## 2017-06-24 ENCOUNTER — Other Ambulatory Visit: Payer: Self-pay

## 2017-06-24 ENCOUNTER — Encounter: Payer: Self-pay | Admitting: Occupational Therapy

## 2017-06-24 DIAGNOSIS — R278 Other lack of coordination: Secondary | ICD-10-CM | POA: Diagnosis present

## 2017-06-24 DIAGNOSIS — M6281 Muscle weakness (generalized): Secondary | ICD-10-CM | POA: Diagnosis present
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
## 2017-06-24 NOTE — Patient Instructions (Signed)
Reviewed treatment goals and updated home exercise program to complete at home since his wife decided to make this his last therapy session today since he is only coming for 45 minutes for OT.

## 2017-06-26 ENCOUNTER — Encounter: Payer: Medicare Other | Admitting: Occupational Therapy

## 2017-07-01 ENCOUNTER — Telehealth: Payer: Self-pay | Admitting: Urology

## 2017-07-01 ENCOUNTER — Ambulatory Visit: Payer: Medicare Other | Admitting: Occupational Therapy

## 2017-07-01 NOTE — Telephone Encounter (Signed)
Pt needs refills on Tamsulosin HCL 0.4.  He uses [facility] [address] [phone]

## 2017-07-02 MED ORDER — TAMSULOSIN HCL 0.4 MG PO CAPS: 0.4000 mg | ORAL_CAPSULE | Freq: Every day | ORAL | 3 refills | Status: DC

## 2017-07-02 NOTE — Telephone Encounter (Signed)
Refills given.

## 2017-07-03 ENCOUNTER — Encounter: Payer: Medicare Other | Admitting: Occupational Therapy

## 2017-07-08 ENCOUNTER — Ambulatory Visit: Payer: Medicare Other | Admitting: Occupational Therapy

## 2017-07-10 ENCOUNTER — Encounter: Payer: Medicare Other | Admitting: Occupational Therapy

## 2017-07-13 IMAGING — XA IR PERC PLACEMENT GASTROSTOMY
1 series · 8 of 8 positions shown · non-contrast
Comparison: None.

INDICATION: Dysphagia. In please place percutaneous gastrostomy tube for enteric
nutrition supplementation.

EXAM:
PULL TROUGH GASTROSTOMY TUBE PLACEMENT

[Series 300: dsa body · 8 of 8 slices shown]
[im 1/8]
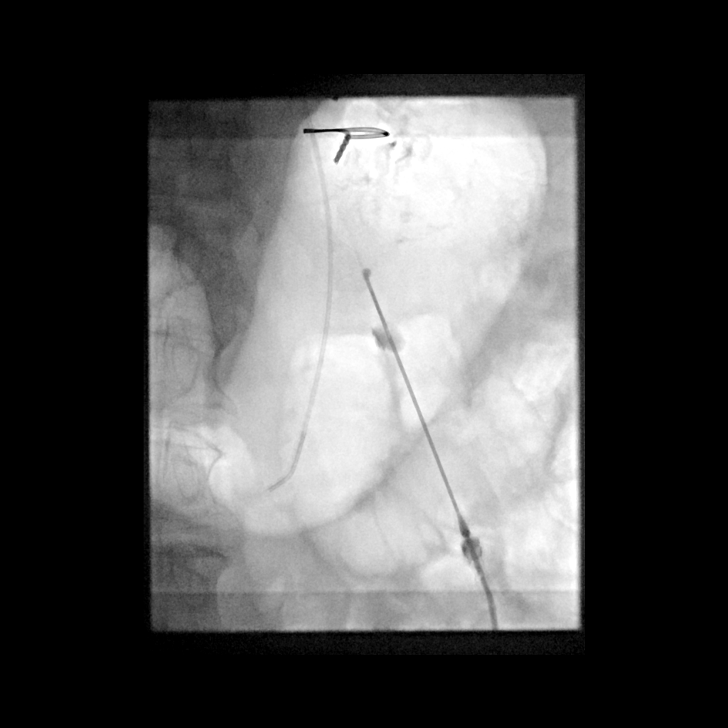
[im 2/8]
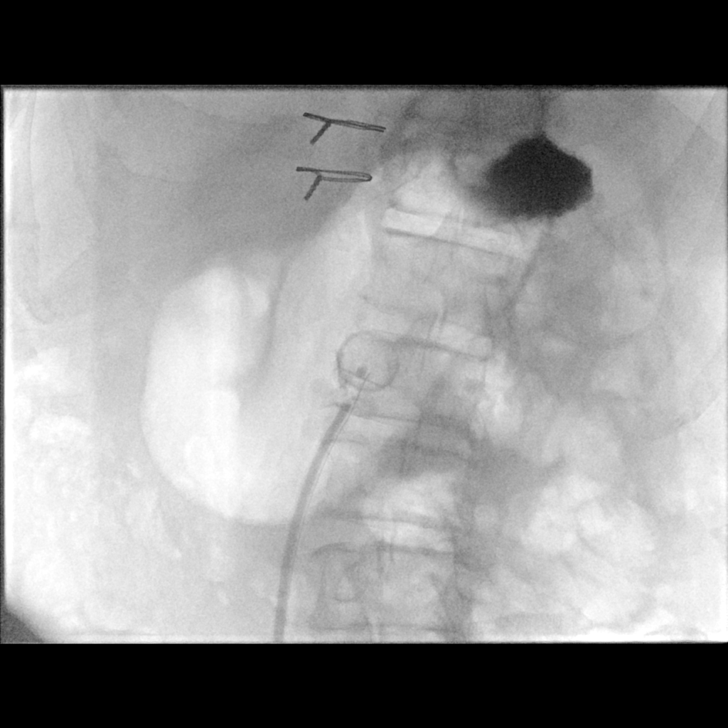
[im 3/8]
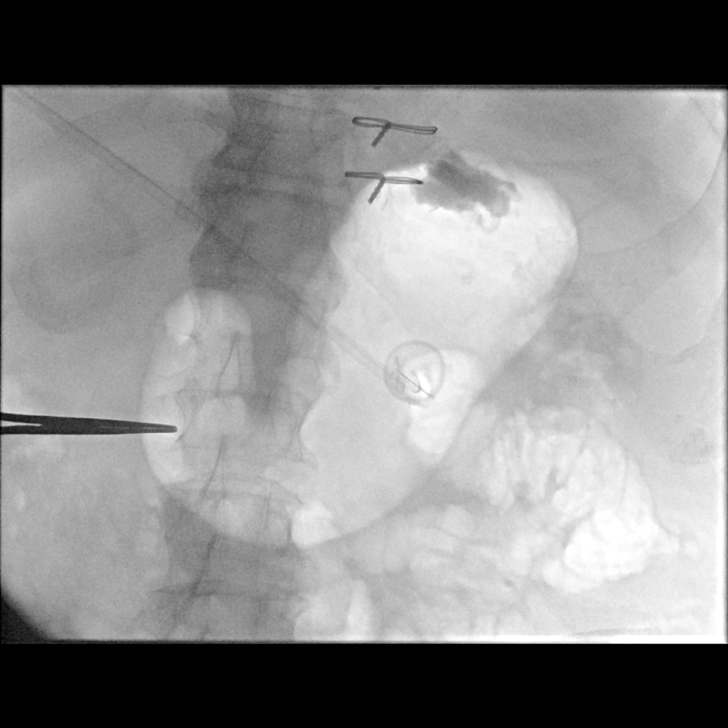
[im 4/8]
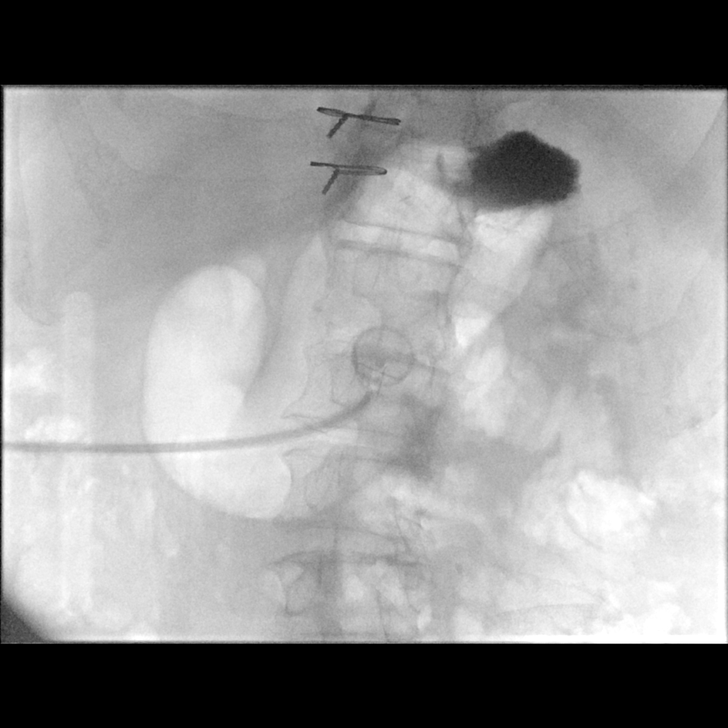
[im 5/8]
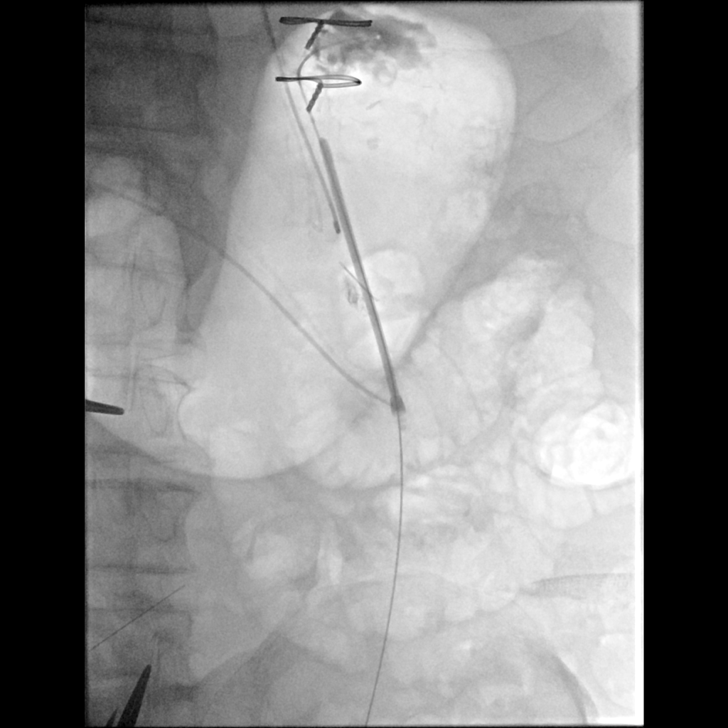
[im 6/8]
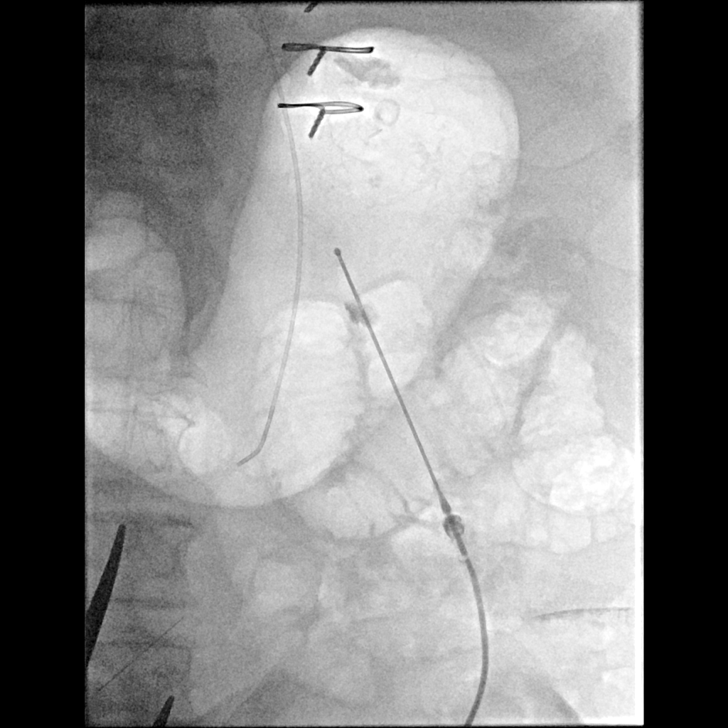
[im 7/8]
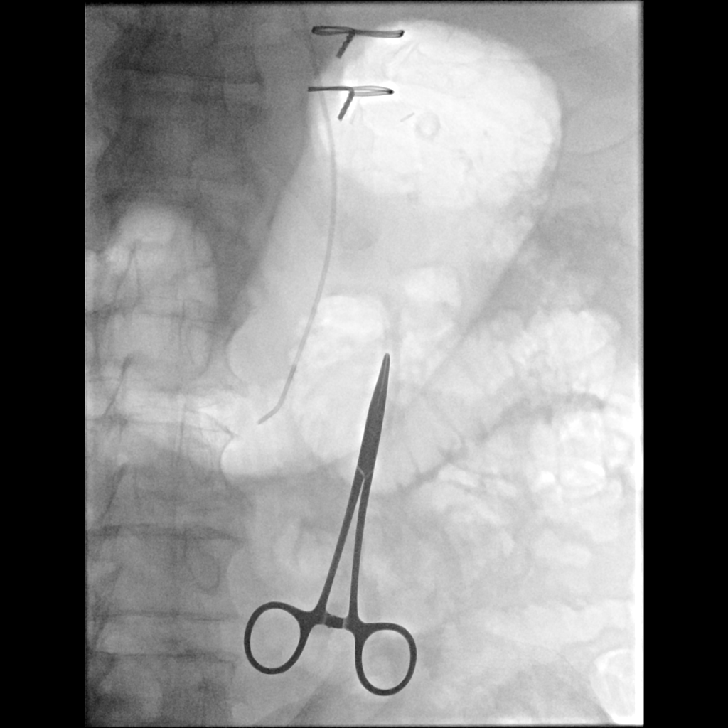
[im 8/8]
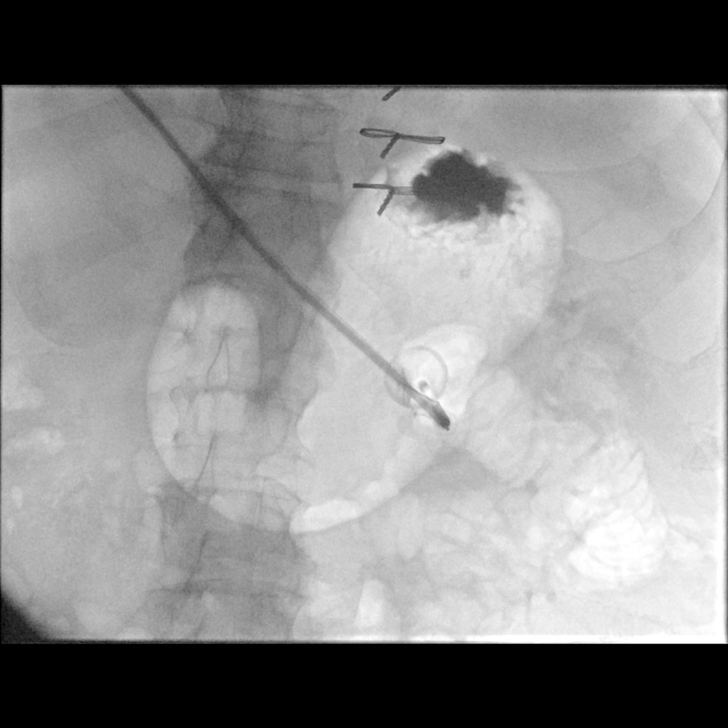

[8 of 8 positions shown; findings below may reference images not displayed]

MEDICATIONS:
Ancef 2 gm IV; Antibiotics were administered within 1 hour of the
procedure.

Glucagon 1 mg IV

CONTRAST:  20 mL of Isovue 300 administered into the gastric lumen.

ANESTHESIA/SEDATION:
Moderate (conscious) sedation was employed during this procedure. A
total of Versed 1 mg and Fentanyl 50 mcg was administered
intravenously.

Moderate Sedation Time: 10 minutes. The patient's level of
consciousness and vital signs were monitored continuously by
radiology nursing throughout the procedure under my direct
supervision.

FLUOROSCOPY TIME:  2 minutes 54 seconds (20 mGy)

COMPLICATIONS:
None immediate.

PROCEDURE:
Informed written consent was obtained from the patient's family
following explanation of the procedure, risks, benefits and
alternatives. A time out was performed prior to the initiation of
the procedure. Ultrasound scanning was performed to demarcate the
edge of the left lobe of the liver. Maximal barrier sterile
technique utilized including caps, mask, sterile gowns, sterile
gloves, large sterile drape, hand hygiene and Betadine prep.

The left upper quadrant was sterilely prepped and draped. An oral
gastric catheter was inserted into the stomach under fluoroscopy.
The existing nasogastric feeding tube was removed. The left costal
margin and air opacified transverse colon were identified and
avoided. Air was injected into the stomach for insufflation and
visualization under fluoroscopy. Under sterile conditions a 17 gauge
trocar needle was utilized to access the stomach percutaneously
beneath the left subcostal margin after the overlying soft tissues
were anesthetized with 1% Lidocaine with epinephrine. Needle
position was confirmed within the stomach with aspiration of air and
injection of small amount of contrast. A single T tack was deployed
for gastropexy. Over an Amplatz guide wire, a 9-French sheath was
inserted into the stomach. A snare device was utilized to capture
the oral gastric catheter. The snare device was pulled retrograde
from the stomach up the esophagus and out the oropharynx. The
20-French pull-through gastrostomy was connected to the snare device
and pulled antegrade through the oropharynx down the esophagus into
the stomach and then through the percutaneous tract external to the
patient. The gastrostomy was assembled externally. Contrast
injection confirms position in the stomach. Several spot
radiographic images were obtained in various obliquities for
documentation. The patient tolerated procedure well without
immediate post procedural complication.
FINDINGS: After successful fluoroscopic guided placement, the gastrostomy tube
is appropriately positioned with internal disc against the ventral
aspect of the gastric lumen.
IMPRESSION: Successful fluoroscopic insertion of a 20-French pull-through
gastrostomy tube.

The gastrostomy may be used immediately for medication
administration and in 24 hrs for the initiation of feeds.

## 2017-07-15 ENCOUNTER — Ambulatory Visit: Payer: Medicare Other | Admitting: Occupational Therapy

## 2017-07-17 ENCOUNTER — Encounter: Payer: Medicare Other | Admitting: Occupational Therapy

## 2017-08-01 ENCOUNTER — Encounter (HOSPITAL_COMMUNITY): Payer: Medicare Other

## 2017-08-01 ENCOUNTER — Ambulatory Visit: Payer: Medicare Other | Admitting: Family

## 2017-08-02 IMAGING — RF DG SWALLOWING FUNCTION - NRPT [facility]
1 series · 18 of 24 positions shown · non-contrast
Comparison: none

[Series 1: run · 21 acquisitions, 18 frames shown]
[im 1/21]
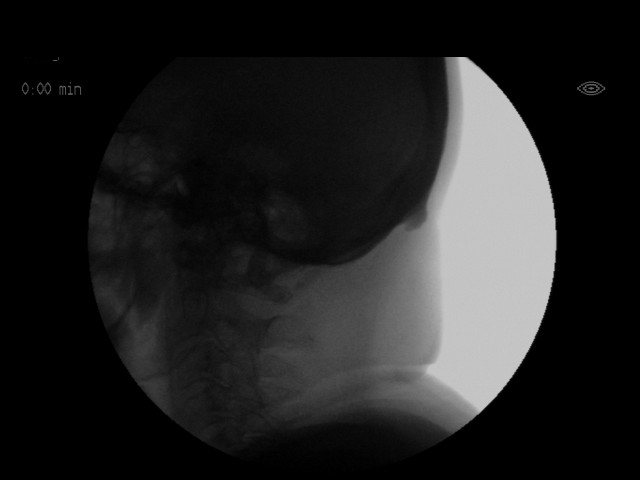
[im 2/21]
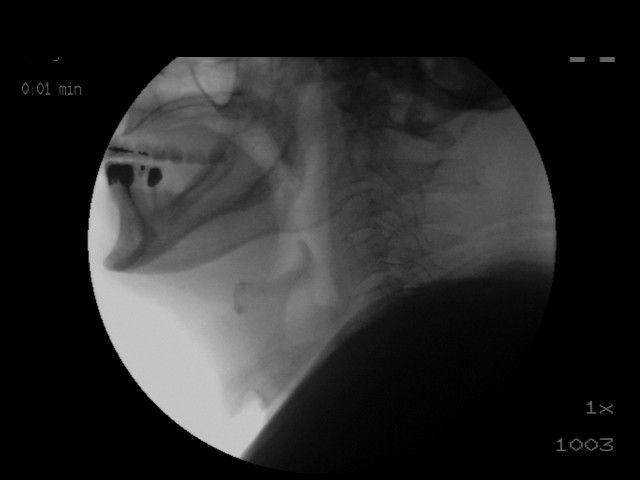
[im 3/21]
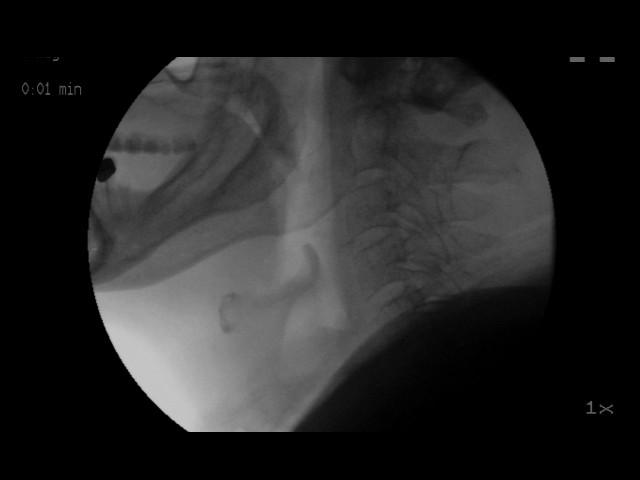
[im 4/21]
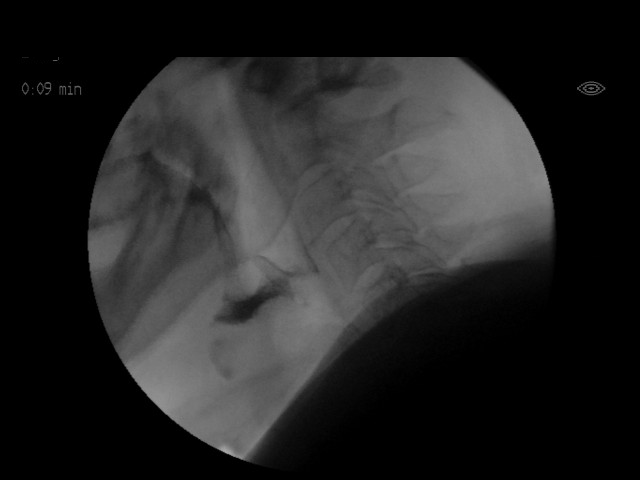
[im 6/21]
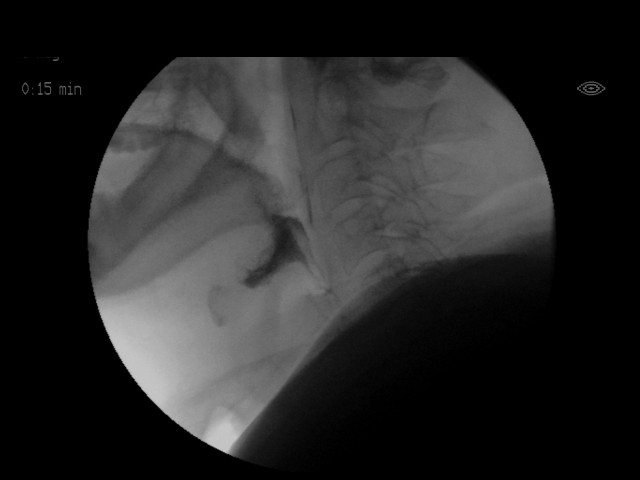
[im 7/21]
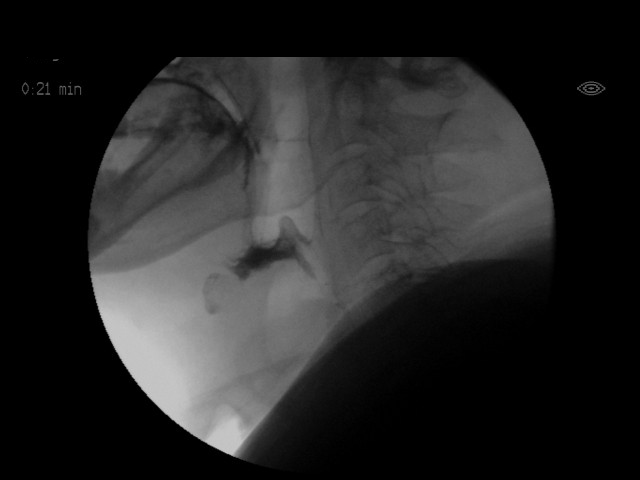
[im 8/21]
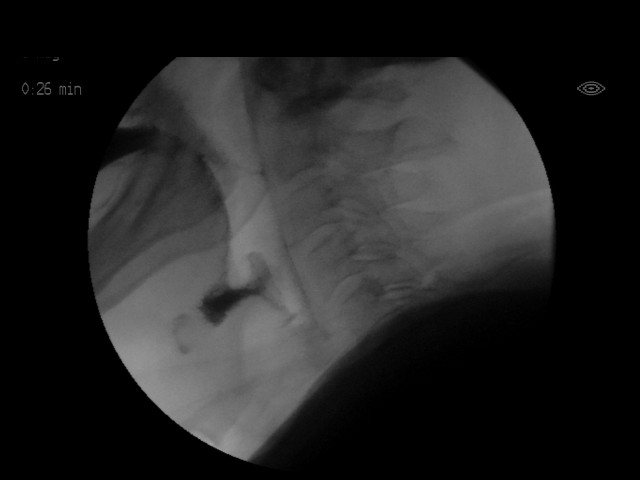
[im 10/21]
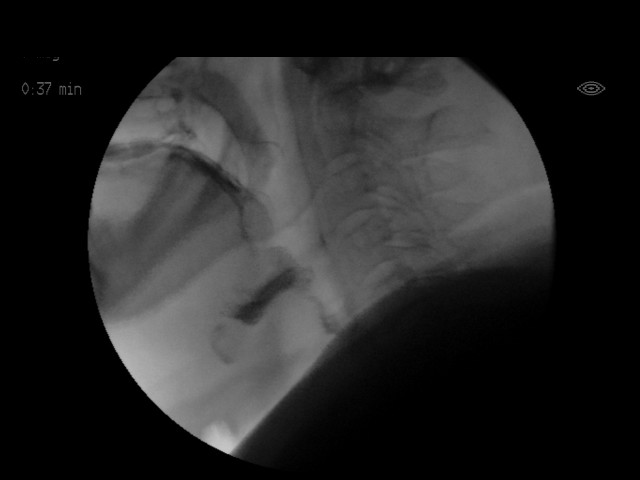
[im 11/21]
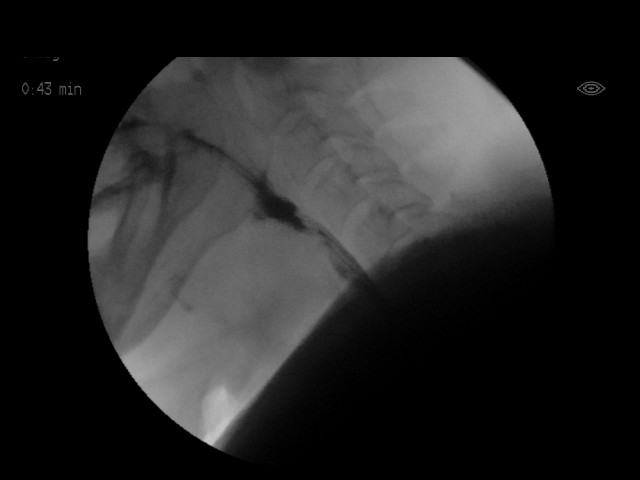
[im 11/21]
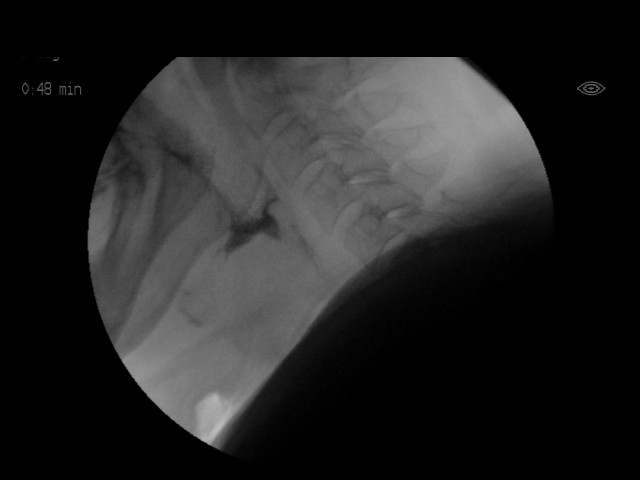
[im 13/21]
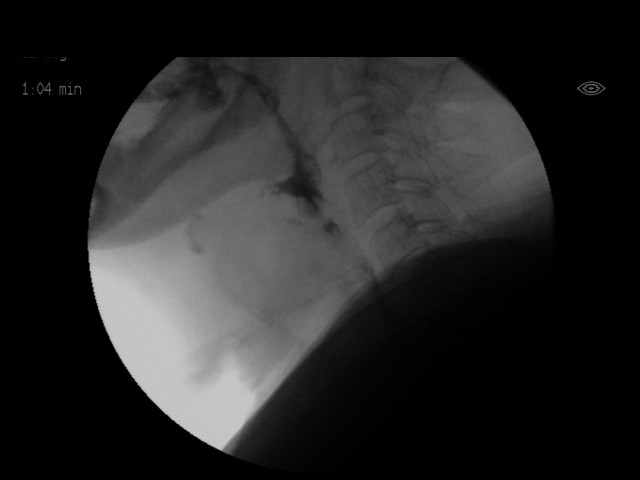
[im 14/21]
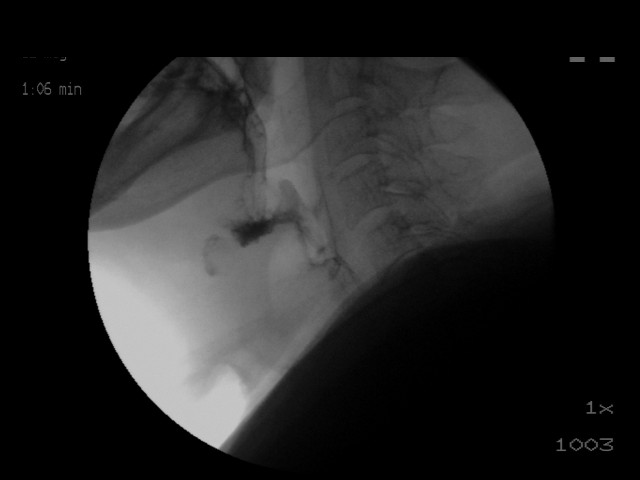
[im 15/21]
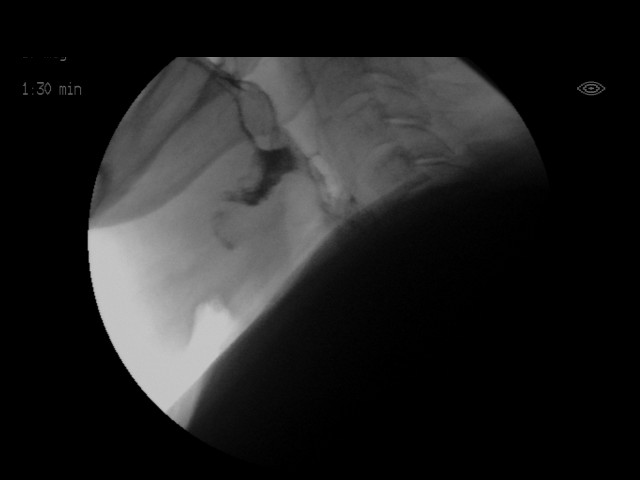
[im 17/21]
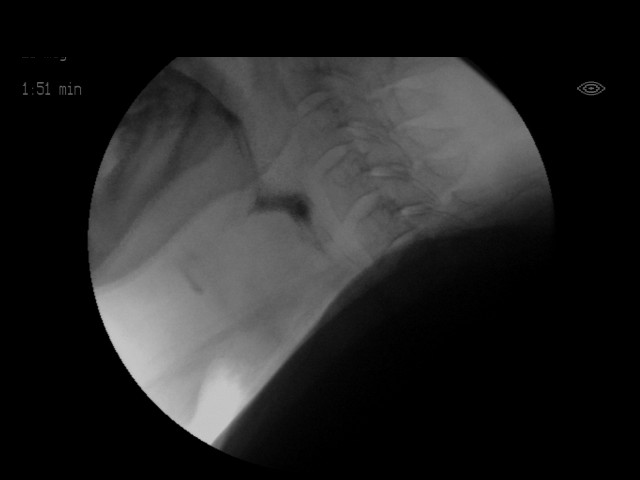
[im 18/21]
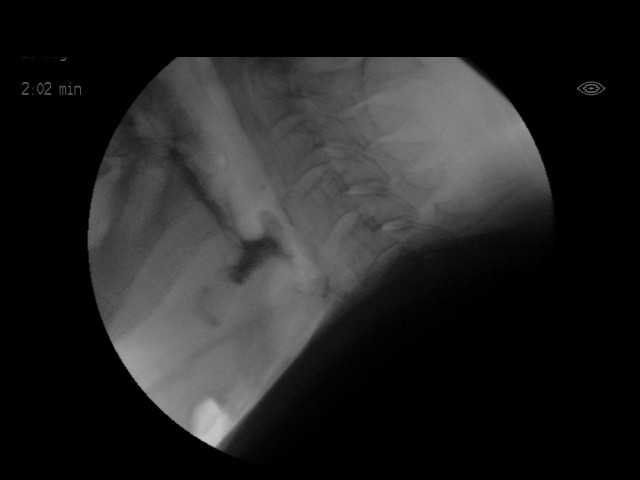
[im 19/21]
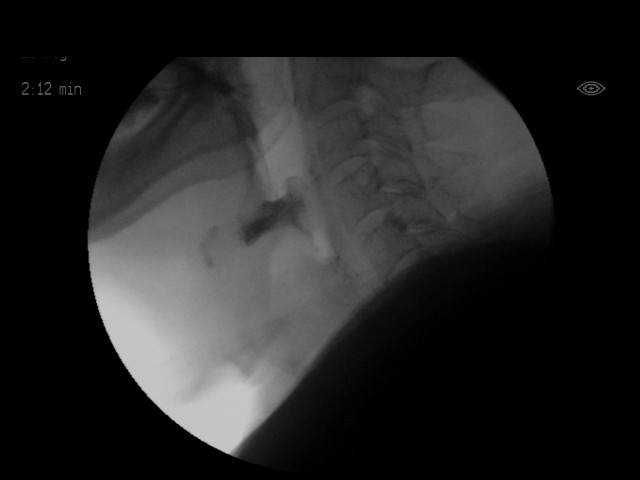
[im 20/21]
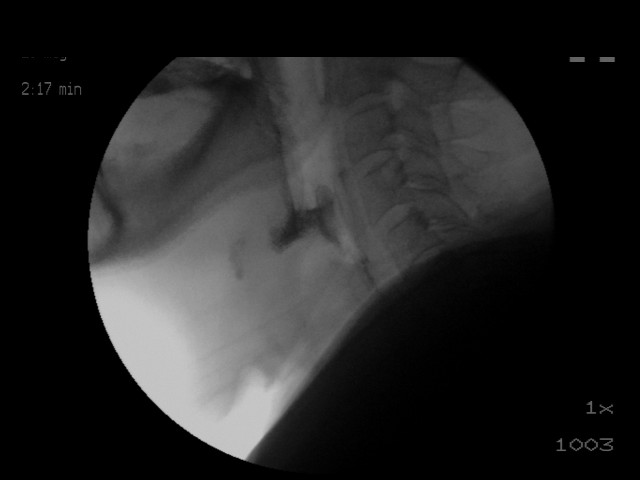
[im 21/21]
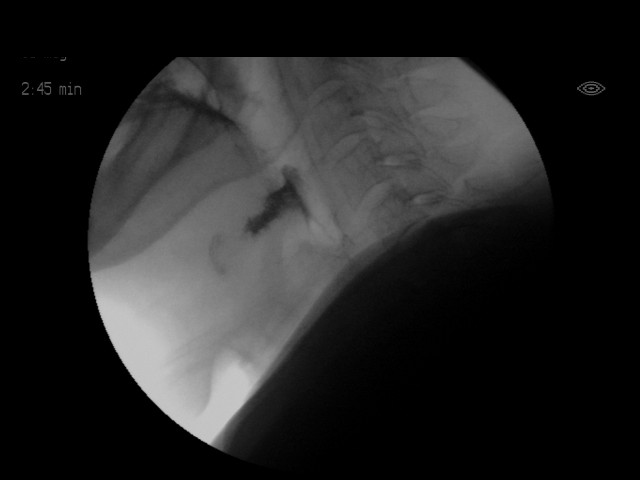

[18 of 24 positions shown; findings below may reference images not displayed]

FLUOROSCOPY FOR SWALLOWING FUNCTION STUDY:
Fluoroscopy was provided for swallowing function study, which was administered by a speech pathologist.  Final results and recommendations from this study are contained within the speech pathology report.

## 2017-08-06 ENCOUNTER — Encounter: Payer: Self-pay | Admitting: Neurology

## 2017-08-06 ENCOUNTER — Ambulatory Visit (INDEPENDENT_AMBULATORY_CARE_PROVIDER_SITE_OTHER): Payer: Medicare Other | Admitting: Neurology

## 2017-08-06 VITALS — BP 138/69 | HR 71 | Ht 69.0 in | Wt 188.0 lb

## 2017-08-06 DIAGNOSIS — G20 Parkinson's disease: Secondary | ICD-10-CM

## 2017-08-06 NOTE — Patient Instructions (Addendum)
We will try to reduce your Sinemet CR to 1 pill 3 times a day, at 9 AM, 3 PM, 9 PM. We will try this for a couple of weeks. If you feel that the sleepiness is much better, we will continue with 3 a day.   If your motor symptoms are worse, such as more tremor, more slowness, more stiffness, we may have to go back up.   I think installing indoor cameras will help, maybe a baby monitor.
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
## 2017-08-27 ENCOUNTER — Telehealth: Payer: Self-pay | Admitting: Neurology

## 2017-08-27 NOTE — Telephone Encounter (Signed)
Pts wife called stating that she raised the dosing of carbidopa-levodopa (SINEMET CR) 50-200 MG tablet back up to 4 tablets a day, due to the pt "slowing down"

## 2017-08-27 NOTE — Telephone Encounter (Signed)
I called pt's wife to discuss. No answer, left a message asking her to call me back. 
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
## 2017-08-28 NOTE — Telephone Encounter (Signed)
Pt's wife, Marylu LundJanet, per DPR, returned my call, and I advised her that Dr. Frances FurbishAthar is agreeable to pt continuing with sinemet CR QID. Pt's wife verbalized understanding.

## 2017-09-04 ENCOUNTER — Telehealth: Payer: Self-pay | Admitting: Neurology

## 2017-09-04 MED ORDER — CARBIDOPA-LEVODOPA ER 50-200 MG PO TBCR: 1.0000 | EXTENDED_RELEASE_TABLET | Freq: Four times a day (QID) | ORAL | 1 refills | Status: DC

## 2017-09-04 NOTE — Telephone Encounter (Signed)
Pt's wife returned my call, she reports that [facility]. Will send RX for sinemet to [facility]. Pt's wife verbalized understanding and appreciation.

## 2017-09-04 NOTE — Telephone Encounter (Signed)
Pt was prescribed a year's worth of sinemet CR in August of 2018, should not be due for a refill until August of 2019.  I called pt to discuss. No answer, left a message asking pt to call me back.

## 2017-09-04 NOTE — Telephone Encounter (Signed)
Pts wife called requesting a refillf ro carbidopa-levodopa (SINEMET CR) 50-200 MG tablet [facility]

## 2017-11-07 ENCOUNTER — Other Ambulatory Visit: Payer: Self-pay | Admitting: Urology

## 2017-11-07 ENCOUNTER — Ambulatory Visit (INDEPENDENT_AMBULATORY_CARE_PROVIDER_SITE_OTHER): Payer: Medicare Other | Admitting: Urology

## 2017-11-07 ENCOUNTER — Encounter: Payer: Self-pay | Admitting: Urology

## 2017-11-07 VITALS — BP 93/62 | HR 74 | Resp 16 | Ht 70.0 in | Wt 189.1 lb

## 2017-11-07 DIAGNOSIS — R399 Unspecified symptoms and signs involving the genitourinary system: Secondary | ICD-10-CM

## 2017-11-07 MED ORDER — TAMSULOSIN HCL 0.4 MG PO CAPS: 0.4000 mg | ORAL_CAPSULE | Freq: Every day | ORAL | 3 refills | Status: DC
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
## 2017-12-09 ENCOUNTER — Encounter: Payer: Self-pay | Admitting: Neurology

## 2017-12-09 ENCOUNTER — Ambulatory Visit (INDEPENDENT_AMBULATORY_CARE_PROVIDER_SITE_OTHER): Payer: Medicare Other | Admitting: Neurology

## 2017-12-09 VITALS — BP 124/71 | HR 63 | Ht 70.0 in | Wt 188.0 lb

## 2017-12-09 DIAGNOSIS — G20 Parkinson's disease: Secondary | ICD-10-CM | POA: Diagnosis not present

## 2017-12-09 DIAGNOSIS — G479 Sleep disorder, unspecified: Secondary | ICD-10-CM | POA: Diagnosis not present

## 2017-12-09 DIAGNOSIS — G4752 REM sleep behavior disorder: Secondary | ICD-10-CM | POA: Diagnosis not present

## 2017-12-09 MED ORDER — CARBIDOPA-LEVODOPA ER 50-200 MG PO TBCR: 1.0000 | EXTENDED_RELEASE_TABLET | Freq: Four times a day (QID) | ORAL | 3 refills | Status: DC
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
## 2017-12-09 NOTE — Patient Instructions (Addendum)
We will keep your Sinemet CR the same.   You can try Melatonin at night for sleep: take 1 mg to 3 mg, one to 2 hours before your bedtime. You can go up to 5 mg if needed. It is over the counter and comes in pill form, chewable form and spray, if you prefer.    We will refer you back to neuro rehab at [facility].

## 2017-12-25 ENCOUNTER — Ambulatory Visit: Payer: Medicare Other | Admitting: Speech Pathology

## 2017-12-25 ENCOUNTER — Ambulatory Visit: Payer: Medicare Other | Attending: Family Medicine | Admitting: Occupational Therapy

## 2017-12-25 ENCOUNTER — Encounter: Payer: Self-pay | Admitting: Occupational Therapy

## 2017-12-25 DIAGNOSIS — R262 Difficulty in walking, not elsewhere classified: Secondary | ICD-10-CM | POA: Insufficient documentation

## 2017-12-25 DIAGNOSIS — R2689 Other abnormalities of gait and mobility: Secondary | ICD-10-CM | POA: Insufficient documentation

## 2017-12-25 DIAGNOSIS — R41841 Cognitive communication deficit: Secondary | ICD-10-CM | POA: Insufficient documentation

## 2017-12-25 DIAGNOSIS — R278 Other lack of coordination: Secondary | ICD-10-CM | POA: Insufficient documentation

## 2017-12-25 DIAGNOSIS — M6281 Muscle weakness (generalized): Secondary | ICD-10-CM | POA: Insufficient documentation

## 2017-12-26 ENCOUNTER — Other Ambulatory Visit: Payer: Self-pay

## 2017-12-26 ENCOUNTER — Encounter: Payer: Self-pay | Admitting: Speech Pathology
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
## 2017-12-30 ENCOUNTER — Encounter: Payer: Self-pay | Admitting: Physical Therapy

## 2017-12-30 ENCOUNTER — Ambulatory Visit: Payer: Medicare Other | Admitting: Physical Therapy

## 2017-12-30 ENCOUNTER — Other Ambulatory Visit: Payer: Self-pay

## 2017-12-30 DIAGNOSIS — R262 Difficulty in walking, not elsewhere classified: Secondary | ICD-10-CM

## 2017-12-30 DIAGNOSIS — R2689 Other abnormalities of gait and mobility: Secondary | ICD-10-CM

## 2017-12-30 DIAGNOSIS — M6281 Muscle weakness (generalized): Secondary | ICD-10-CM | POA: Diagnosis not present

## 2017-12-30 NOTE — Patient Instructions (Signed)
Access Code: [email or web address]  URL: [email or web address]  Date: 12/30/2017  Prepared by: Zettie PhoMargaret Trotter   Exercises  Standing March with Counter Support - 15 reps - 2 sets - 1x daily - 7x weekly  Seated Long Arc Quad - 15 reps - 2 sets - 1x daily - 7x weekly
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
## 2018-01-01 ENCOUNTER — Ambulatory Visit: Payer: Medicare Other | Admitting: Speech Pathology

## 2018-01-01 ENCOUNTER — Ambulatory Visit: Payer: Medicare Other | Admitting: Physical Therapy

## 2018-01-01 ENCOUNTER — Encounter: Payer: Self-pay | Admitting: Physical Therapy

## 2018-01-01 ENCOUNTER — Ambulatory Visit: Payer: Medicare Other | Admitting: Occupational Therapy

## 2018-01-01 DIAGNOSIS — R278 Other lack of coordination: Secondary | ICD-10-CM

## 2018-01-01 DIAGNOSIS — M6281 Muscle weakness (generalized): Secondary | ICD-10-CM

## 2018-01-01 DIAGNOSIS — R41841 Cognitive communication deficit: Secondary | ICD-10-CM

## 2018-01-01 DIAGNOSIS — R2689 Other abnormalities of gait and mobility: Secondary | ICD-10-CM

## 2018-01-01 DIAGNOSIS — R262 Difficulty in walking, not elsewhere classified: Secondary | ICD-10-CM
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
## 2018-01-02 ENCOUNTER — Encounter: Payer: Self-pay | Admitting: Speech Pathology
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
## 2018-01-03 ENCOUNTER — Encounter: Payer: Self-pay | Admitting: Occupational Therapy
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
## 2018-01-06 ENCOUNTER — Encounter: Payer: Self-pay | Admitting: Occupational Therapy

## 2018-01-06 ENCOUNTER — Ambulatory Visit: Payer: Medicare Other | Admitting: Occupational Therapy

## 2018-01-06 ENCOUNTER — Ambulatory Visit: Payer: Medicare Other | Admitting: Speech Pathology

## 2018-01-06 DIAGNOSIS — M6281 Muscle weakness (generalized): Secondary | ICD-10-CM

## 2018-01-06 DIAGNOSIS — R278 Other lack of coordination: Secondary | ICD-10-CM

## 2018-01-06 DIAGNOSIS — R41841 Cognitive communication deficit: Secondary | ICD-10-CM
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
## 2018-01-07 ENCOUNTER — Encounter: Payer: Self-pay | Admitting: Speech Pathology
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
## 2018-01-08 ENCOUNTER — Encounter: Payer: Self-pay | Admitting: Occupational Therapy

## 2018-01-08 ENCOUNTER — Encounter: Payer: Self-pay | Admitting: Physical Therapy

## 2018-01-08 ENCOUNTER — Ambulatory Visit: Payer: Medicare Other | Admitting: Physical Therapy

## 2018-01-08 ENCOUNTER — Ambulatory Visit: Payer: Medicare Other | Admitting: Speech Pathology

## 2018-01-08 ENCOUNTER — Encounter: Payer: Self-pay | Admitting: Speech Pathology

## 2018-01-08 ENCOUNTER — Ambulatory Visit: Payer: Medicare Other | Admitting: Occupational Therapy

## 2018-01-08 DIAGNOSIS — R278 Other lack of coordination: Secondary | ICD-10-CM

## 2018-01-08 DIAGNOSIS — R2689 Other abnormalities of gait and mobility: Secondary | ICD-10-CM

## 2018-01-08 DIAGNOSIS — M6281 Muscle weakness (generalized): Secondary | ICD-10-CM

## 2018-01-08 DIAGNOSIS — R262 Difficulty in walking, not elsewhere classified: Secondary | ICD-10-CM

## 2018-01-08 DIAGNOSIS — R41841 Cognitive communication deficit: Secondary | ICD-10-CM
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
## 2018-01-08 NOTE — Patient Instructions (Signed)
Access Code: [email or web address]  URL: [email or web address]  Date: 01/08/2018  Prepared by: Zettie PhoMargaret Trotter   Exercises  Heel rises with counter support - 15 reps - 2 sets - 2x daily - 7x weekly  Standing Hip Abduction with Counter Support - 15 reps - 2 sets - 2x daily - 7x weekly  Standing Knee Flexion - 15 reps - 2 sets - 2x daily - 7x weekly
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
## 2018-01-13 ENCOUNTER — Ambulatory Visit: Payer: Medicare Other | Admitting: Occupational Therapy

## 2018-01-13 ENCOUNTER — Encounter: Payer: Self-pay | Admitting: Occupational Therapy

## 2018-01-13 ENCOUNTER — Ambulatory Visit: Payer: Medicare Other | Admitting: Speech Pathology

## 2018-01-13 ENCOUNTER — Encounter: Payer: Self-pay | Admitting: Speech Pathology

## 2018-01-13 DIAGNOSIS — R41841 Cognitive communication deficit: Secondary | ICD-10-CM

## 2018-01-13 DIAGNOSIS — M6281 Muscle weakness (generalized): Secondary | ICD-10-CM

## 2018-01-13 DIAGNOSIS — R278 Other lack of coordination: Secondary | ICD-10-CM
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
## 2018-01-15 ENCOUNTER — Ambulatory Visit: Payer: Medicare Other | Admitting: Speech Pathology

## 2018-01-15 ENCOUNTER — Ambulatory Visit: Payer: Medicare Other | Admitting: Occupational Therapy

## 2018-01-15 ENCOUNTER — Encounter: Payer: Self-pay | Admitting: Speech Pathology

## 2018-01-15 ENCOUNTER — Ambulatory Visit: Payer: Medicare Other | Admitting: Physical Therapy

## 2018-01-15 ENCOUNTER — Encounter: Payer: Self-pay | Admitting: Physical Therapy

## 2018-01-15 DIAGNOSIS — M6281 Muscle weakness (generalized): Secondary | ICD-10-CM | POA: Diagnosis not present

## 2018-01-15 DIAGNOSIS — R2689 Other abnormalities of gait and mobility: Secondary | ICD-10-CM

## 2018-01-15 DIAGNOSIS — R262 Difficulty in walking, not elsewhere classified: Secondary | ICD-10-CM

## 2018-01-15 DIAGNOSIS — R41841 Cognitive communication deficit: Secondary | ICD-10-CM

## 2018-01-15 DIAGNOSIS — R278 Other lack of coordination: Secondary | ICD-10-CM
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
## 2018-01-15 NOTE — Patient Instructions (Signed)
Patient may obtain a 3-5# ankle weight for strengthening exercise.  These can be purchased at any sporting good store: [employer], [employer], [employer]
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
## 2018-01-17 ENCOUNTER — Encounter: Payer: Self-pay | Admitting: Occupational Therapy
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
## 2018-01-22 ENCOUNTER — Encounter: Payer: Self-pay | Admitting: Physical Therapy

## 2018-01-22 ENCOUNTER — Ambulatory Visit: Payer: Medicare Other | Attending: Family Medicine | Admitting: Speech Pathology

## 2018-01-22 ENCOUNTER — Ambulatory Visit: Payer: Medicare Other | Admitting: Physical Therapy

## 2018-01-22 ENCOUNTER — Ambulatory Visit: Payer: Medicare Other | Admitting: Occupational Therapy

## 2018-01-22 ENCOUNTER — Encounter: Payer: Self-pay | Admitting: Speech Pathology

## 2018-01-22 ENCOUNTER — Encounter: Payer: Self-pay | Admitting: Occupational Therapy

## 2018-01-22 DIAGNOSIS — M6281 Muscle weakness (generalized): Secondary | ICD-10-CM

## 2018-01-22 DIAGNOSIS — R41841 Cognitive communication deficit: Secondary | ICD-10-CM | POA: Diagnosis present

## 2018-01-22 DIAGNOSIS — R278 Other lack of coordination: Secondary | ICD-10-CM

## 2018-01-22 DIAGNOSIS — R2689 Other abnormalities of gait and mobility: Secondary | ICD-10-CM | POA: Insufficient documentation

## 2018-01-22 DIAGNOSIS — R262 Difficulty in walking, not elsewhere classified: Secondary | ICD-10-CM
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
## 2018-01-27 ENCOUNTER — Encounter: Payer: Self-pay | Admitting: Physical Therapy

## 2018-01-27 ENCOUNTER — Ambulatory Visit: Payer: Medicare Other | Admitting: Occupational Therapy

## 2018-01-27 ENCOUNTER — Encounter: Payer: Medicare Other | Admitting: Speech Pathology

## 2018-01-27 ENCOUNTER — Ambulatory Visit: Payer: Medicare Other | Admitting: Physical Therapy

## 2018-01-27 ENCOUNTER — Encounter: Payer: Self-pay | Admitting: Occupational Therapy

## 2018-01-27 DIAGNOSIS — R41841 Cognitive communication deficit: Secondary | ICD-10-CM | POA: Diagnosis not present

## 2018-01-27 DIAGNOSIS — M6281 Muscle weakness (generalized): Secondary | ICD-10-CM

## 2018-01-27 DIAGNOSIS — R278 Other lack of coordination: Secondary | ICD-10-CM

## 2018-01-27 DIAGNOSIS — R2689 Other abnormalities of gait and mobility: Secondary | ICD-10-CM

## 2018-01-27 DIAGNOSIS — R262 Difficulty in walking, not elsewhere classified: Secondary | ICD-10-CM
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
## 2018-01-29 ENCOUNTER — Encounter: Payer: Self-pay | Admitting: Physical Therapy

## 2018-01-29 ENCOUNTER — Encounter: Payer: Medicare Other | Admitting: Speech Pathology

## 2018-01-29 ENCOUNTER — Encounter: Payer: Self-pay | Admitting: Occupational Therapy

## 2018-01-29 ENCOUNTER — Ambulatory Visit: Payer: Medicare Other | Admitting: Physical Therapy

## 2018-01-29 ENCOUNTER — Ambulatory Visit: Payer: Medicare Other | Admitting: Occupational Therapy

## 2018-01-29 DIAGNOSIS — R41841 Cognitive communication deficit: Secondary | ICD-10-CM | POA: Diagnosis not present

## 2018-01-29 DIAGNOSIS — R262 Difficulty in walking, not elsewhere classified: Secondary | ICD-10-CM

## 2018-01-29 DIAGNOSIS — R278 Other lack of coordination: Secondary | ICD-10-CM

## 2018-01-29 DIAGNOSIS — M6281 Muscle weakness (generalized): Secondary | ICD-10-CM

## 2018-01-29 DIAGNOSIS — R2689 Other abnormalities of gait and mobility: Secondary | ICD-10-CM
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
## 2018-02-04 ENCOUNTER — Ambulatory Visit: Payer: Medicare Other | Admitting: Physical Therapy

## 2018-02-04 ENCOUNTER — Encounter: Payer: Medicare Other | Admitting: Speech Pathology

## 2018-02-04 ENCOUNTER — Encounter: Payer: Medicare Other | Admitting: Occupational Therapy

## 2018-02-06 ENCOUNTER — Encounter: Payer: Self-pay | Admitting: Physical Therapy

## 2018-02-06 ENCOUNTER — Ambulatory Visit: Payer: Medicare Other | Admitting: Occupational Therapy

## 2018-02-06 ENCOUNTER — Encounter: Payer: Medicare Other | Admitting: Speech Pathology

## 2018-02-06 ENCOUNTER — Ambulatory Visit: Payer: Medicare Other | Admitting: Physical Therapy

## 2018-02-06 ENCOUNTER — Encounter: Payer: Self-pay | Admitting: Occupational Therapy

## 2018-02-06 DIAGNOSIS — R2689 Other abnormalities of gait and mobility: Secondary | ICD-10-CM

## 2018-02-06 DIAGNOSIS — R262 Difficulty in walking, not elsewhere classified: Secondary | ICD-10-CM

## 2018-02-06 DIAGNOSIS — M6281 Muscle weakness (generalized): Secondary | ICD-10-CM

## 2018-02-06 DIAGNOSIS — R278 Other lack of coordination: Secondary | ICD-10-CM

## 2018-02-06 DIAGNOSIS — R41841 Cognitive communication deficit: Secondary | ICD-10-CM | POA: Diagnosis not present
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
## 2018-02-11 ENCOUNTER — Ambulatory Visit: Payer: Medicare Other | Admitting: Occupational Therapy

## 2018-02-11 ENCOUNTER — Encounter: Payer: Self-pay | Admitting: Physical Therapy

## 2018-02-11 ENCOUNTER — Encounter: Payer: Self-pay | Admitting: Occupational Therapy

## 2018-02-11 ENCOUNTER — Ambulatory Visit: Payer: Medicare Other | Admitting: Physical Therapy

## 2018-02-11 ENCOUNTER — Encounter: Payer: Medicare Other | Admitting: Speech Pathology

## 2018-02-11 DIAGNOSIS — M6281 Muscle weakness (generalized): Secondary | ICD-10-CM

## 2018-02-11 DIAGNOSIS — R278 Other lack of coordination: Secondary | ICD-10-CM

## 2018-02-11 DIAGNOSIS — R262 Difficulty in walking, not elsewhere classified: Secondary | ICD-10-CM

## 2018-02-11 DIAGNOSIS — R41841 Cognitive communication deficit: Secondary | ICD-10-CM | POA: Diagnosis not present

## 2018-02-11 DIAGNOSIS — R2689 Other abnormalities of gait and mobility: Secondary | ICD-10-CM
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
## 2018-02-13 ENCOUNTER — Encounter: Payer: Self-pay | Admitting: Physical Therapy

## 2018-02-13 ENCOUNTER — Encounter: Payer: Medicare Other | Admitting: Speech Pathology

## 2018-02-13 ENCOUNTER — Ambulatory Visit: Payer: Medicare Other | Admitting: Physical Therapy

## 2018-02-13 ENCOUNTER — Ambulatory Visit: Payer: Medicare Other | Admitting: Occupational Therapy

## 2018-02-13 DIAGNOSIS — R262 Difficulty in walking, not elsewhere classified: Secondary | ICD-10-CM

## 2018-02-13 DIAGNOSIS — M6281 Muscle weakness (generalized): Secondary | ICD-10-CM

## 2018-02-13 DIAGNOSIS — R278 Other lack of coordination: Secondary | ICD-10-CM

## 2018-02-13 DIAGNOSIS — R41841 Cognitive communication deficit: Secondary | ICD-10-CM | POA: Diagnosis not present

## 2018-02-13 DIAGNOSIS — R2689 Other abnormalities of gait and mobility: Secondary | ICD-10-CM
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
## 2018-02-18 ENCOUNTER — Ambulatory Visit: Payer: Medicare Other | Attending: Neurology | Admitting: Occupational Therapy

## 2018-02-18 ENCOUNTER — Encounter: Payer: Medicare Other | Admitting: Speech Pathology

## 2018-02-18 ENCOUNTER — Encounter: Payer: Self-pay | Admitting: Occupational Therapy

## 2018-02-18 ENCOUNTER — Encounter: Payer: Self-pay | Admitting: Physical Therapy

## 2018-02-18 ENCOUNTER — Ambulatory Visit: Payer: Medicare Other | Admitting: Physical Therapy

## 2018-02-18 DIAGNOSIS — R278 Other lack of coordination: Secondary | ICD-10-CM

## 2018-02-18 DIAGNOSIS — M6281 Muscle weakness (generalized): Secondary | ICD-10-CM | POA: Diagnosis present

## 2018-02-18 DIAGNOSIS — R262 Difficulty in walking, not elsewhere classified: Secondary | ICD-10-CM

## 2018-02-18 DIAGNOSIS — R2689 Other abnormalities of gait and mobility: Secondary | ICD-10-CM
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
## 2018-02-20 ENCOUNTER — Ambulatory Visit: Payer: Medicare Other | Admitting: Physical Therapy

## 2018-02-20 ENCOUNTER — Ambulatory Visit: Payer: Medicare Other | Admitting: Occupational Therapy

## 2018-02-20 ENCOUNTER — Encounter: Payer: Medicare Other | Admitting: Speech Pathology

## 2018-02-20 ENCOUNTER — Encounter: Payer: Self-pay | Admitting: Physical Therapy

## 2018-02-20 DIAGNOSIS — R2689 Other abnormalities of gait and mobility: Secondary | ICD-10-CM

## 2018-02-20 DIAGNOSIS — M6281 Muscle weakness (generalized): Secondary | ICD-10-CM | POA: Diagnosis not present

## 2018-02-20 DIAGNOSIS — R278 Other lack of coordination: Secondary | ICD-10-CM

## 2018-02-20 DIAGNOSIS — R262 Difficulty in walking, not elsewhere classified: Secondary | ICD-10-CM
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
## 2018-02-24 ENCOUNTER — Encounter: Payer: Self-pay | Admitting: Physical Therapy

## 2018-02-24 ENCOUNTER — Ambulatory Visit: Payer: Medicare Other | Admitting: Occupational Therapy

## 2018-02-24 ENCOUNTER — Encounter: Payer: Self-pay | Admitting: Occupational Therapy

## 2018-02-24 ENCOUNTER — Encounter: Payer: Medicare Other | Admitting: Speech Pathology

## 2018-02-24 ENCOUNTER — Ambulatory Visit: Payer: Medicare Other | Admitting: Physical Therapy

## 2018-02-24 DIAGNOSIS — R2689 Other abnormalities of gait and mobility: Secondary | ICD-10-CM

## 2018-02-24 DIAGNOSIS — R278 Other lack of coordination: Secondary | ICD-10-CM

## 2018-02-24 DIAGNOSIS — M6281 Muscle weakness (generalized): Secondary | ICD-10-CM | POA: Diagnosis not present

## 2018-02-24 DIAGNOSIS — R262 Difficulty in walking, not elsewhere classified: Secondary | ICD-10-CM
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
## 2018-02-26 ENCOUNTER — Ambulatory Visit: Payer: Medicare Other | Admitting: Physical Therapy

## 2018-02-26 ENCOUNTER — Encounter: Payer: Self-pay | Admitting: Physical Therapy

## 2018-02-26 ENCOUNTER — Encounter: Payer: Medicare Other | Admitting: Speech Pathology

## 2018-02-26 DIAGNOSIS — R2689 Other abnormalities of gait and mobility: Secondary | ICD-10-CM

## 2018-02-26 DIAGNOSIS — M6281 Muscle weakness (generalized): Secondary | ICD-10-CM

## 2018-02-26 DIAGNOSIS — R262 Difficulty in walking, not elsewhere classified: Secondary | ICD-10-CM
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
## 2018-03-03 ENCOUNTER — Ambulatory Visit: Payer: Medicare Other | Admitting: Physical Therapy

## 2018-03-03 ENCOUNTER — Encounter: Payer: Medicare Other | Admitting: Speech Pathology

## 2018-03-05 ENCOUNTER — Ambulatory Visit: Payer: Medicare Other | Admitting: Physical Therapy

## 2018-03-05 ENCOUNTER — Encounter: Payer: Medicare Other | Admitting: Speech Pathology

## 2018-03-10 ENCOUNTER — Ambulatory Visit: Payer: Medicare Other

## 2018-03-10 ENCOUNTER — Encounter: Payer: Medicare Other | Admitting: Speech Pathology

## 2018-03-10 ENCOUNTER — Encounter: Payer: Self-pay | Admitting: Occupational Therapy

## 2018-03-10 ENCOUNTER — Ambulatory Visit: Payer: Medicare Other | Admitting: Occupational Therapy

## 2018-03-10 DIAGNOSIS — R2689 Other abnormalities of gait and mobility: Secondary | ICD-10-CM

## 2018-03-10 DIAGNOSIS — M6281 Muscle weakness (generalized): Secondary | ICD-10-CM

## 2018-03-10 DIAGNOSIS — R278 Other lack of coordination: Secondary | ICD-10-CM

## 2018-03-10 DIAGNOSIS — R262 Difficulty in walking, not elsewhere classified: Secondary | ICD-10-CM
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
## 2018-03-12 ENCOUNTER — Ambulatory Visit: Payer: Medicare Other | Admitting: Physical Therapy

## 2018-03-12 ENCOUNTER — Ambulatory Visit: Payer: Medicare Other | Admitting: Occupational Therapy

## 2018-03-12 ENCOUNTER — Encounter: Payer: Medicare Other | Admitting: Speech Pathology

## 2018-03-12 ENCOUNTER — Encounter: Payer: Self-pay | Admitting: Physical Therapy

## 2018-03-12 DIAGNOSIS — R2689 Other abnormalities of gait and mobility: Secondary | ICD-10-CM

## 2018-03-12 DIAGNOSIS — M6281 Muscle weakness (generalized): Secondary | ICD-10-CM

## 2018-03-12 DIAGNOSIS — R278 Other lack of coordination: Secondary | ICD-10-CM

## 2018-03-12 DIAGNOSIS — R262 Difficulty in walking, not elsewhere classified: Secondary | ICD-10-CM
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
## 2018-03-13 ENCOUNTER — Other Ambulatory Visit: Payer: Self-pay

## 2018-03-15 ENCOUNTER — Encounter: Payer: Self-pay | Admitting: Occupational Therapy
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
## 2018-03-17 ENCOUNTER — Ambulatory Visit: Payer: Medicare Other | Admitting: Occupational Therapy

## 2018-03-17 ENCOUNTER — Encounter: Payer: Medicare Other | Admitting: Speech Pathology

## 2018-03-17 ENCOUNTER — Ambulatory Visit: Payer: Medicare Other

## 2018-03-17 ENCOUNTER — Encounter: Payer: Self-pay | Admitting: Occupational Therapy

## 2018-03-17 DIAGNOSIS — R278 Other lack of coordination: Secondary | ICD-10-CM

## 2018-03-17 DIAGNOSIS — R262 Difficulty in walking, not elsewhere classified: Secondary | ICD-10-CM

## 2018-03-17 DIAGNOSIS — M6281 Muscle weakness (generalized): Secondary | ICD-10-CM | POA: Diagnosis not present

## 2018-03-17 DIAGNOSIS — R2689 Other abnormalities of gait and mobility: Secondary | ICD-10-CM
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
## 2018-03-19 ENCOUNTER — Ambulatory Visit: Payer: Medicare Other | Admitting: Occupational Therapy

## 2018-03-19 ENCOUNTER — Ambulatory Visit (INDEPENDENT_AMBULATORY_CARE_PROVIDER_SITE_OTHER): Payer: Medicare Other | Admitting: Family Medicine

## 2018-03-19 ENCOUNTER — Encounter: Payer: Medicare Other | Admitting: Occupational Therapy

## 2018-03-19 ENCOUNTER — Ambulatory Visit: Payer: Medicare Other

## 2018-03-19 VITALS — BP 110/68 | HR 64 | Ht 70.0 in

## 2018-03-19 DIAGNOSIS — M6281 Muscle weakness (generalized): Secondary | ICD-10-CM

## 2018-03-19 DIAGNOSIS — R350 Frequency of micturition: Secondary | ICD-10-CM

## 2018-03-19 DIAGNOSIS — R278 Other lack of coordination: Secondary | ICD-10-CM

## 2018-03-19 DIAGNOSIS — R2689 Other abnormalities of gait and mobility: Secondary | ICD-10-CM

## 2018-03-19 DIAGNOSIS — R262 Difficulty in walking, not elsewhere classified: Secondary | ICD-10-CM

## 2018-03-19 LAB — MICROSCOPIC EXAMINATION: Epithelial Cells (non renal): NONE SEEN /hpf (ref 0–10)

## 2018-03-19 LAB — URINALYSIS, COMPLETE
BILIRUBIN UA: NEGATIVE
GLUCOSE, UA: NEGATIVE
NITRITE UA: NEGATIVE
Protein, UA: NEGATIVE
Specific Gravity, UA: 1.025 (ref 1.005–1.030)
UUROB: 1 mg/dL (ref 0.2–1.0)
pH, UA: 5.5 (ref 5.0–7.5)
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
## 2018-03-19 NOTE — Progress Notes (Signed)
Patient presents today Post UTI, he complains of still having urinary frequency and hematuria. He was on ABX this month. He has not had any Urological surgeries in the last 30 days. A urine was collected for UA, UCX. Carollee Herter reviewed the UA and we will wait to see what the UCX grows before determining what ABX to give.

## 2018-03-20 ENCOUNTER — Encounter: Payer: Self-pay | Admitting: Occupational Therapy
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
## 2018-03-22 LAB — CULTURE, URINE COMPREHENSIVE

## 2018-03-24 ENCOUNTER — Telehealth: Payer: Self-pay

## 2018-03-24 ENCOUNTER — Ambulatory Visit: Payer: Medicare Other | Attending: Neurology | Admitting: Occupational Therapy

## 2018-03-24 DIAGNOSIS — R278 Other lack of coordination: Secondary | ICD-10-CM | POA: Diagnosis present

## 2018-03-24 DIAGNOSIS — R2681 Unsteadiness on feet: Secondary | ICD-10-CM | POA: Insufficient documentation

## 2018-03-24 DIAGNOSIS — R262 Difficulty in walking, not elsewhere classified: Secondary | ICD-10-CM | POA: Diagnosis present

## 2018-03-24 DIAGNOSIS — M6281 Muscle weakness (generalized): Secondary | ICD-10-CM | POA: Insufficient documentation

## 2018-03-24 MED ORDER — NITROFURANTOIN MONOHYD MACRO 100 MG PO CAPS: 100.0000 mg | ORAL_CAPSULE | Freq: Two times a day (BID) | ORAL | 0 refills | Status: DC

## 2018-03-24 NOTE — Telephone Encounter (Signed)
-----   Message from Harle Battiest, PA-C sent at 03/24/2018  8:06 AM EST ----- Mr. Colter has a positive urine culture for MRSA.  He needs to start nitrofurantoin and he needs to have contact precautions at home.  I suggest sequestering 1 restroom in the home but only he uses at the time.  He needs to wash his hands after using the restroom.  If any other members in the home need to come in contact with his bodily fluids, they need to wear gloves.

## 2018-03-24 NOTE — Telephone Encounter (Signed)
Left pt mess to call, script sent and mychart sent as well

## 2018-03-28 ENCOUNTER — Encounter: Payer: Self-pay | Admitting: Occupational Therapy
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
## 2018-03-31 ENCOUNTER — Ambulatory Visit: Payer: Medicare Other | Admitting: Occupational Therapy

## 2018-03-31 DIAGNOSIS — R2681 Unsteadiness on feet: Secondary | ICD-10-CM

## 2018-03-31 DIAGNOSIS — M6281 Muscle weakness (generalized): Secondary | ICD-10-CM

## 2018-03-31 DIAGNOSIS — R278 Other lack of coordination: Secondary | ICD-10-CM

## 2018-03-31 DIAGNOSIS — R262 Difficulty in walking, not elsewhere classified: Secondary | ICD-10-CM

## 2018-04-01 ENCOUNTER — Ambulatory Visit: Payer: Medicare Other | Admitting: Occupational Therapy

## 2018-04-01 DIAGNOSIS — R2681 Unsteadiness on feet: Secondary | ICD-10-CM

## 2018-04-01 DIAGNOSIS — R278 Other lack of coordination: Secondary | ICD-10-CM

## 2018-04-01 DIAGNOSIS — M6281 Muscle weakness (generalized): Secondary | ICD-10-CM

## 2018-04-01 DIAGNOSIS — R262 Difficulty in walking, not elsewhere classified: Secondary | ICD-10-CM

## 2018-04-02 ENCOUNTER — Ambulatory Visit: Payer: Medicare Other | Admitting: Occupational Therapy

## 2018-04-02 DIAGNOSIS — R2681 Unsteadiness on feet: Secondary | ICD-10-CM

## 2018-04-02 DIAGNOSIS — M6281 Muscle weakness (generalized): Secondary | ICD-10-CM

## 2018-04-02 DIAGNOSIS — R262 Difficulty in walking, not elsewhere classified: Secondary | ICD-10-CM

## 2018-04-02 DIAGNOSIS — R278 Other lack of coordination: Secondary | ICD-10-CM

## 2018-04-03 ENCOUNTER — Ambulatory Visit: Payer: Medicare Other | Admitting: Occupational Therapy

## 2018-04-03 DIAGNOSIS — R278 Other lack of coordination: Secondary | ICD-10-CM

## 2018-04-03 DIAGNOSIS — M6281 Muscle weakness (generalized): Secondary | ICD-10-CM | POA: Diagnosis not present

## 2018-04-03 DIAGNOSIS — R262 Difficulty in walking, not elsewhere classified: Secondary | ICD-10-CM

## 2018-04-03 DIAGNOSIS — R2681 Unsteadiness on feet: Secondary | ICD-10-CM

## 2018-04-07 ENCOUNTER — Other Ambulatory Visit: Payer: Self-pay

## 2018-04-07 ENCOUNTER — Ambulatory Visit: Payer: Medicare Other | Admitting: Occupational Therapy

## 2018-04-07 ENCOUNTER — Telehealth: Payer: Self-pay | Admitting: Urology

## 2018-04-07 DIAGNOSIS — M6281 Muscle weakness (generalized): Secondary | ICD-10-CM | POA: Diagnosis not present

## 2018-04-07 DIAGNOSIS — R262 Difficulty in walking, not elsewhere classified: Secondary | ICD-10-CM

## 2018-04-07 DIAGNOSIS — R350 Frequency of micturition: Secondary | ICD-10-CM

## 2018-04-07 DIAGNOSIS — R2681 Unsteadiness on feet: Secondary | ICD-10-CM

## 2018-04-07 DIAGNOSIS — R278 Other lack of coordination: Secondary | ICD-10-CM

## 2018-04-07 NOTE — Telephone Encounter (Signed)
Patient finished his ABX and he still has some orange looking blood in his Urine and wants to if he needs to be seen?   Please call his wife @ [phone]   Gregory DusterMichelle

## 2018-04-07 NOTE — Telephone Encounter (Signed)
I would just recommend a follow-up urinalysis and culture if UA abnormal

## 2018-04-07 NOTE — Telephone Encounter (Signed)
Spoke to patient's wife and he will drop off Urine tomorrow.

## 2018-04-08 ENCOUNTER — Encounter: Payer: Self-pay | Admitting: Occupational Therapy

## 2018-04-08 ENCOUNTER — Ambulatory Visit: Payer: Medicare Other | Admitting: Occupational Therapy

## 2018-04-08 ENCOUNTER — Other Ambulatory Visit: Payer: Medicare Other

## 2018-04-08 DIAGNOSIS — M6281 Muscle weakness (generalized): Secondary | ICD-10-CM | POA: Diagnosis not present

## 2018-04-08 DIAGNOSIS — R262 Difficulty in walking, not elsewhere classified: Secondary | ICD-10-CM

## 2018-04-08 DIAGNOSIS — R278 Other lack of coordination: Secondary | ICD-10-CM

## 2018-04-08 DIAGNOSIS — R350 Frequency of micturition: Secondary | ICD-10-CM

## 2018-04-08 DIAGNOSIS — R2681 Unsteadiness on feet: Secondary | ICD-10-CM

## 2018-04-08 LAB — URINALYSIS, COMPLETE
Bilirubin, UA: NEGATIVE
GLUCOSE, UA: NEGATIVE
Nitrite, UA: NEGATIVE
SPEC GRAV UA: 1.025 (ref 1.005–1.030)
Urobilinogen, Ur: 1 mg/dL (ref 0.2–1.0)
pH, UA: 5.5 (ref 5.0–7.5)

## 2018-04-08 LAB — MICROSCOPIC EXAMINATION: EPITHELIAL CELLS (NON RENAL): NONE SEEN /HPF (ref 0–10)
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
## 2018-04-09 ENCOUNTER — Ambulatory Visit: Payer: Medicare Other | Admitting: Occupational Therapy

## 2018-04-09 DIAGNOSIS — R262 Difficulty in walking, not elsewhere classified: Secondary | ICD-10-CM

## 2018-04-09 DIAGNOSIS — R278 Other lack of coordination: Secondary | ICD-10-CM

## 2018-04-09 DIAGNOSIS — R2681 Unsteadiness on feet: Secondary | ICD-10-CM

## 2018-04-09 DIAGNOSIS — M6281 Muscle weakness (generalized): Secondary | ICD-10-CM | POA: Diagnosis not present

## 2018-04-10 ENCOUNTER — Ambulatory Visit: Payer: Medicare Other | Admitting: Occupational Therapy

## 2018-04-10 DIAGNOSIS — R278 Other lack of coordination: Secondary | ICD-10-CM

## 2018-04-10 DIAGNOSIS — R2681 Unsteadiness on feet: Secondary | ICD-10-CM

## 2018-04-10 DIAGNOSIS — M6281 Muscle weakness (generalized): Secondary | ICD-10-CM

## 2018-04-10 DIAGNOSIS — R262 Difficulty in walking, not elsewhere classified: Secondary | ICD-10-CM

## 2018-04-11 LAB — CULTURE, URINE COMPREHENSIVE

## 2018-04-12 ENCOUNTER — Encounter: Payer: Self-pay | Admitting: Occupational Therapy
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
## 2018-04-14 ENCOUNTER — Telehealth: Payer: Self-pay

## 2018-04-14 ENCOUNTER — Encounter: Payer: Self-pay | Admitting: Urology

## 2018-04-14 ENCOUNTER — Encounter: Payer: Self-pay | Admitting: Neurology

## 2018-04-14 ENCOUNTER — Ambulatory Visit (INDEPENDENT_AMBULATORY_CARE_PROVIDER_SITE_OTHER): Payer: Medicare Other | Admitting: Neurology

## 2018-04-14 VITALS — BP 102/58 | HR 62 | Ht 70.0 in | Wt 179.0 lb

## 2018-04-14 DIAGNOSIS — G20 Parkinson's disease: Secondary | ICD-10-CM | POA: Diagnosis not present

## 2018-04-14 NOTE — Telephone Encounter (Signed)
-----   Message from Riki AltesScott C Stoioff, MD sent at 04/13/2018 12:13 PM EST ----- Urine culture grew staph.  Is he having symptoms?

## 2018-04-14 NOTE — Telephone Encounter (Signed)
Subject: Visit Follow-Up Question  The symptoms he has is the reddish color in the urine, the frequency of urinating and the urgency to urinate.  Gregory [name] says he does not have any pain or discomfort  with urinating.
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
## 2018-04-14 NOTE — Patient Instructions (Addendum)
Please do the Parkinson's exercises at home also.  You may benefit from re-evaluation with OT/PT and ST after about 6 months.   Please be really proactive with your constipation medication regimen, titrating as needed to where you have a formed stool at least every other day. Try to add Senna 1-2 daily.

## 2018-04-15 ENCOUNTER — Ambulatory Visit: Payer: Medicare Other | Admitting: Occupational Therapy

## 2018-04-15 MED ORDER — DOXYCYCLINE HYCLATE 100 MG PO CAPS: 100.0000 mg | ORAL_CAPSULE | Freq: Two times a day (BID) | ORAL | 0 refills | Status: AC

## 2018-04-15 NOTE — Telephone Encounter (Signed)
Antibiotic Rx was sent to pharmacy.  This may affect his Coumadin-would recommend checking an INR next Monday

## 2018-04-15 NOTE — Telephone Encounter (Signed)
Called pt no answer. LM for pt informing him of the information below. Advised pt to call back for questions or concerns.  

## 2018-04-16 ENCOUNTER — Ambulatory Visit: Payer: Medicare Other | Admitting: Occupational Therapy

## 2018-04-21 ENCOUNTER — Ambulatory Visit: Payer: Medicare Other | Admitting: Occupational Therapy

## 2018-04-22 ENCOUNTER — Ambulatory Visit: Payer: Medicare Other | Attending: Neurology | Admitting: Occupational Therapy

## 2018-04-22 ENCOUNTER — Encounter: Payer: Self-pay | Admitting: Occupational Therapy

## 2018-04-22 DIAGNOSIS — M6281 Muscle weakness (generalized): Secondary | ICD-10-CM | POA: Insufficient documentation

## 2018-04-22 DIAGNOSIS — R2681 Unsteadiness on feet: Secondary | ICD-10-CM | POA: Insufficient documentation

## 2018-04-22 DIAGNOSIS — R262 Difficulty in walking, not elsewhere classified: Secondary | ICD-10-CM | POA: Diagnosis present

## 2018-04-22 DIAGNOSIS — R278 Other lack of coordination: Secondary | ICD-10-CM
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
## 2018-04-23 ENCOUNTER — Ambulatory Visit: Payer: Medicare Other | Admitting: Occupational Therapy

## 2018-04-23 DIAGNOSIS — R2681 Unsteadiness on feet: Secondary | ICD-10-CM

## 2018-04-23 DIAGNOSIS — M6281 Muscle weakness (generalized): Secondary | ICD-10-CM | POA: Diagnosis not present

## 2018-04-23 DIAGNOSIS — R262 Difficulty in walking, not elsewhere classified: Secondary | ICD-10-CM

## 2018-04-23 DIAGNOSIS — R278 Other lack of coordination: Secondary | ICD-10-CM

## 2018-04-24 ENCOUNTER — Ambulatory Visit: Payer: Medicare Other | Admitting: Occupational Therapy

## 2018-04-24 DIAGNOSIS — R262 Difficulty in walking, not elsewhere classified: Secondary | ICD-10-CM

## 2018-04-24 DIAGNOSIS — R2681 Unsteadiness on feet: Secondary | ICD-10-CM

## 2018-04-24 DIAGNOSIS — M6281 Muscle weakness (generalized): Secondary | ICD-10-CM

## 2018-04-24 DIAGNOSIS — R278 Other lack of coordination: Secondary | ICD-10-CM

## 2018-04-26 ENCOUNTER — Encounter: Payer: Self-pay | Admitting: Occupational Therapy
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
## 2018-04-28 ENCOUNTER — Ambulatory Visit: Payer: Medicare Other | Admitting: Occupational Therapy

## 2018-04-28 DIAGNOSIS — R262 Difficulty in walking, not elsewhere classified: Secondary | ICD-10-CM

## 2018-04-28 DIAGNOSIS — M6281 Muscle weakness (generalized): Secondary | ICD-10-CM | POA: Diagnosis not present

## 2018-04-28 DIAGNOSIS — R2681 Unsteadiness on feet: Secondary | ICD-10-CM

## 2018-04-28 DIAGNOSIS — R278 Other lack of coordination: Secondary | ICD-10-CM

## 2018-04-29 ENCOUNTER — Ambulatory Visit: Payer: Medicare Other | Admitting: Occupational Therapy

## 2018-04-29 DIAGNOSIS — M6281 Muscle weakness (generalized): Secondary | ICD-10-CM | POA: Diagnosis not present

## 2018-04-29 DIAGNOSIS — R262 Difficulty in walking, not elsewhere classified: Secondary | ICD-10-CM

## 2018-04-29 DIAGNOSIS — R2681 Unsteadiness on feet: Secondary | ICD-10-CM

## 2018-04-29 DIAGNOSIS — R278 Other lack of coordination: Secondary | ICD-10-CM

## 2018-04-30 ENCOUNTER — Encounter: Payer: Self-pay | Admitting: Occupational Therapy
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
## 2018-05-07 ENCOUNTER — Ambulatory Visit: Payer: Medicare Other | Admitting: Occupational Therapy

## 2018-05-07 DIAGNOSIS — M6281 Muscle weakness (generalized): Secondary | ICD-10-CM

## 2018-05-07 DIAGNOSIS — R2681 Unsteadiness on feet: Secondary | ICD-10-CM

## 2018-05-07 DIAGNOSIS — R262 Difficulty in walking, not elsewhere classified: Secondary | ICD-10-CM

## 2018-05-07 DIAGNOSIS — R278 Other lack of coordination: Secondary | ICD-10-CM

## 2018-05-08 ENCOUNTER — Ambulatory Visit: Payer: Medicare Other | Admitting: Occupational Therapy

## 2018-05-08 DIAGNOSIS — M6281 Muscle weakness (generalized): Secondary | ICD-10-CM | POA: Diagnosis not present

## 2018-05-08 DIAGNOSIS — R262 Difficulty in walking, not elsewhere classified: Secondary | ICD-10-CM

## 2018-05-08 DIAGNOSIS — R278 Other lack of coordination: Secondary | ICD-10-CM

## 2018-05-08 DIAGNOSIS — R2681 Unsteadiness on feet: Secondary | ICD-10-CM

## 2018-05-09 ENCOUNTER — Encounter: Payer: Self-pay | Admitting: Occupational Therapy
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
## 2018-05-19 ENCOUNTER — Ambulatory Visit: Payer: Medicare Other | Admitting: Occupational Therapy

## 2018-05-19 DIAGNOSIS — M6281 Muscle weakness (generalized): Secondary | ICD-10-CM

## 2018-05-19 DIAGNOSIS — R278 Other lack of coordination: Secondary | ICD-10-CM

## 2018-05-19 DIAGNOSIS — R262 Difficulty in walking, not elsewhere classified: Secondary | ICD-10-CM

## 2018-05-19 DIAGNOSIS — R2681 Unsteadiness on feet: Secondary | ICD-10-CM

## 2018-05-20 ENCOUNTER — Other Ambulatory Visit: Payer: Self-pay | Admitting: Urology

## 2018-05-20 ENCOUNTER — Encounter: Payer: Self-pay | Admitting: Occupational Therapy

## 2018-05-20 MED ORDER — TAMSULOSIN HCL 0.4 MG PO CAPS: 0.4000 mg | ORAL_CAPSULE | Freq: Every day | ORAL | 3 refills | Status: DC
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
## 2018-05-22 ENCOUNTER — Ambulatory Visit: Payer: Medicare Other | Admitting: Occupational Therapy

## 2018-05-26 ENCOUNTER — Encounter: Payer: Medicare Other | Admitting: Occupational Therapy

## 2018-05-27 ENCOUNTER — Encounter: Payer: Medicare Other | Admitting: Occupational Therapy

## 2018-05-28 ENCOUNTER — Encounter: Payer: Medicare Other | Admitting: Occupational Therapy

## 2018-05-29 ENCOUNTER — Encounter: Payer: Medicare Other | Admitting: Occupational Therapy

## 2018-06-04 ENCOUNTER — Ambulatory Visit: Payer: Medicare Other | Attending: Neurology | Admitting: Occupational Therapy

## 2018-06-04 DIAGNOSIS — R278 Other lack of coordination: Secondary | ICD-10-CM

## 2018-06-04 DIAGNOSIS — R2681 Unsteadiness on feet: Secondary | ICD-10-CM

## 2018-06-04 DIAGNOSIS — M6281 Muscle weakness (generalized): Secondary | ICD-10-CM | POA: Diagnosis not present

## 2018-06-04 DIAGNOSIS — R262 Difficulty in walking, not elsewhere classified: Secondary | ICD-10-CM | POA: Diagnosis present

## 2018-06-06 ENCOUNTER — Other Ambulatory Visit (HOSPITAL_COMMUNITY)
Admission: RE | Admit: 2018-06-06 | Payer: Medicare Other | Source: Ambulatory Visit | Admitting: Obstetrics & Gynecology

## 2018-06-07 ENCOUNTER — Encounter: Payer: Self-pay | Admitting: Occupational Therapy
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
## 2018-06-10 ENCOUNTER — Ambulatory Visit: Payer: Medicare Other | Admitting: Occupational Therapy

## 2018-06-10 DIAGNOSIS — M6281 Muscle weakness (generalized): Secondary | ICD-10-CM | POA: Diagnosis not present

## 2018-06-10 DIAGNOSIS — R262 Difficulty in walking, not elsewhere classified: Secondary | ICD-10-CM

## 2018-06-10 DIAGNOSIS — R278 Other lack of coordination: Secondary | ICD-10-CM

## 2018-06-10 DIAGNOSIS — R2681 Unsteadiness on feet: Secondary | ICD-10-CM

## 2018-06-18 ENCOUNTER — Ambulatory Visit: Payer: Medicare Other | Admitting: Occupational Therapy

## 2018-06-18 ENCOUNTER — Encounter: Payer: Self-pay | Admitting: Occupational Therapy
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
## 2018-07-05 ENCOUNTER — Encounter: Payer: Self-pay | Admitting: Cardiology
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
## 2018-07-07 ENCOUNTER — Encounter: Payer: Self-pay | Admitting: Cardiology

## 2018-07-07 ENCOUNTER — Ambulatory Visit (INDEPENDENT_AMBULATORY_CARE_PROVIDER_SITE_OTHER): Payer: Medicare Other | Admitting: Cardiology

## 2018-07-07 VITALS — BP 128/66 | HR 63 | Ht 70.0 in | Wt 182.9 lb

## 2018-07-07 DIAGNOSIS — I4821 Permanent atrial fibrillation: Secondary | ICD-10-CM

## 2018-07-07 DIAGNOSIS — I714 Abdominal aortic aneurysm, without rupture, unspecified: Secondary | ICD-10-CM

## 2018-07-07 DIAGNOSIS — I25810 Atherosclerosis of coronary artery bypass graft(s) without angina pectoris: Secondary | ICD-10-CM

## 2018-07-07 DIAGNOSIS — I2510 Atherosclerotic heart disease of native coronary artery without angina pectoris: Secondary | ICD-10-CM | POA: Diagnosis not present

## 2018-07-07 DIAGNOSIS — I6523 Occlusion and stenosis of bilateral carotid arteries: Secondary | ICD-10-CM

## 2018-07-07 DIAGNOSIS — G20 Parkinson's disease: Secondary | ICD-10-CM

## 2018-08-13 ENCOUNTER — Ambulatory Visit: Payer: Medicare Other | Admitting: Neurology

## 2018-08-19 ENCOUNTER — Telehealth: Payer: Self-pay

## 2018-08-19 NOTE — Telephone Encounter (Signed)
Due to current COVID 19 pandemic, our office is severely reducing in office visits for at least the next 2 weeks, in order to minimize the risk to our patients and healthcare providers.    I called pt's wife, [name] per [place]. Pt is on the waitlist for a sooner appt. Pt's wife is agreeable to a virtual visit with Dr. Frances Furbish.  Pt's wife understands that although there may be some limitations with this type of visit, we will take all precautions to reduce any security or privacy concerns.  Pt's wife understands that this will be treated like an in office visit and we will file with pt's insurance, and there may be a patient responsible charge related to this service.  Pt's email is [email or web address]. Pt understands that the [employer] webex software must be downloaded and operational on the device pt plans to use for the visit.  Pt's meds, allergies, and PMH were updated.  Pt's wife reports that overall he is doing well. His bowels are more regular with the addition of metamucil.

## 2018-08-20 ENCOUNTER — Other Ambulatory Visit: Payer: Self-pay

## 2018-08-20 ENCOUNTER — Encounter: Payer: Self-pay | Admitting: Neurology

## 2018-08-20 ENCOUNTER — Ambulatory Visit (INDEPENDENT_AMBULATORY_CARE_PROVIDER_SITE_OTHER): Payer: Medicare Other | Admitting: Neurology

## 2018-08-20 DIAGNOSIS — G20 Parkinson's disease: Secondary | ICD-10-CM | POA: Diagnosis not present

## 2018-08-20 DIAGNOSIS — E785 Hyperlipidemia, unspecified: Secondary | ICD-10-CM

## 2018-08-20 MED ORDER — ATORVASTATIN CALCIUM 20 MG PO TABS: 20.0000 mg | ORAL_TABLET | ORAL | 3 refills | Status: DC

## 2018-08-20 MED ORDER — CARBIDOPA-LEVODOPA ER 50-200 MG PO TBCR: 1.0000 | EXTENDED_RELEASE_TABLET | Freq: Four times a day (QID) | ORAL | 3 refills | Status: DC

## 2018-08-20 NOTE — Patient Instructions (Signed)
Given verbally, during today's virtual video-based encounter, with verbal feedback received.   
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
## 2018-10-21 ENCOUNTER — Telehealth: Payer: Self-pay | Admitting: Neurology

## 2018-10-21 NOTE — Telephone Encounter (Signed)
Due to current COVID 19 pandemic, our office is severely reducing in office visits until further notice, in order to minimize the risk to our patients and healthcare providers.   Called patient and spoke with wife to confirm a virtual visit for a 6/4 appointment. She verbalized understanding of the [email or web address] process and I have sent her an e-mail with link and directions as well as my name and office number/hours for reference. She understands that she will receive a call from RN to update chart.  Pt understands that although there may be some limitations with this type of visit, we will take all precautions to reduce any security or privacy concerns.  Pt understands that this will be treated like an in office visit and we will file with pt's insurance, and there may be a patient responsible charge related to this service.

## 2018-10-22 NOTE — Telephone Encounter (Signed)
Pt's wife [name] on [place] called and agreed to bring pt in to the office for the appt. She stated they have not been out of the state or country in the past 14 days and that they are not having any COVID-19 symptoms. She was informed that they will need to be wearing masks when they come to the appt.

## 2018-10-22 NOTE — Telephone Encounter (Signed)
Dr. Frances Furbish recommends that pt's appt tomorrow be in office. If pt is stable, his appt may be delayed until August.  I called pt to discuss. No answer, left a message asking pt to call back. If pt calls back, please discuss this with them.

## 2018-10-23 ENCOUNTER — Other Ambulatory Visit: Payer: Self-pay

## 2018-10-23 ENCOUNTER — Encounter: Payer: Self-pay | Admitting: Neurology

## 2018-10-23 ENCOUNTER — Ambulatory Visit (INDEPENDENT_AMBULATORY_CARE_PROVIDER_SITE_OTHER): Payer: Medicare Other | Admitting: Neurology

## 2018-10-23 VITALS — BP 119/68 | HR 71 | Temp 98.0°F | Ht 71.0 in | Wt 183.0 lb

## 2018-10-23 DIAGNOSIS — K5909 Other constipation: Secondary | ICD-10-CM | POA: Diagnosis not present

## 2018-10-23 DIAGNOSIS — R410 Disorientation, unspecified: Secondary | ICD-10-CM | POA: Diagnosis not present

## 2018-10-23 DIAGNOSIS — G20 Parkinson's disease: Secondary | ICD-10-CM | POA: Diagnosis not present

## 2018-10-23 DIAGNOSIS — R441 Visual hallucinations: Secondary | ICD-10-CM
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
## 2018-10-23 NOTE — Patient Instructions (Signed)
Keep Up the good work with your constipation regimen, you are doing a great job with your water intake.  Try to keep up your exercising on the stationary bike.  From the Parkinson standpoint you are quite stable.  If you have a UTI, you will definitely benefit from treating it and it may help with the confusion and tone down the hallucinations.

## 2018-11-04 ENCOUNTER — Ambulatory Visit: Payer: Medicare Other | Admitting: Urology

## 2018-11-04 ENCOUNTER — Ambulatory Visit: Payer: Medicare Other | Admitting: Neurology

## 2018-11-10 ENCOUNTER — Ambulatory Visit: Payer: Medicare Other | Admitting: Urology

## 2018-12-09 ENCOUNTER — Other Ambulatory Visit: Payer: Medicare Other

## 2018-12-23 ENCOUNTER — Ambulatory Visit (INDEPENDENT_AMBULATORY_CARE_PROVIDER_SITE_OTHER): Payer: Medicare Other | Admitting: Urology

## 2018-12-23 ENCOUNTER — Encounter: Payer: Self-pay | Admitting: Urology

## 2018-12-23 ENCOUNTER — Other Ambulatory Visit: Payer: Self-pay

## 2018-12-23 VITALS — BP 102/66 | HR 75 | Ht 71.0 in | Wt 184.8 lb

## 2018-12-23 DIAGNOSIS — N3941 Urge incontinence: Secondary | ICD-10-CM | POA: Diagnosis not present

## 2018-12-23 DIAGNOSIS — R351 Nocturia: Secondary | ICD-10-CM | POA: Diagnosis not present

## 2018-12-23 DIAGNOSIS — R350 Frequency of micturition: Secondary | ICD-10-CM | POA: Diagnosis not present

## 2018-12-23 MED ORDER — TAMSULOSIN HCL 0.4 MG PO CAPS: 0.4000 mg | ORAL_CAPSULE | Freq: Every day | ORAL | 3 refills | Status: DC
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
## 2018-12-24 ENCOUNTER — Ambulatory Visit: Payer: Medicare Other | Admitting: Cardiology

## 2019-01-07 ENCOUNTER — Other Ambulatory Visit: Payer: Medicare Other

## 2019-01-12 ENCOUNTER — Telehealth: Payer: Medicare Other | Admitting: Cardiology

## 2019-01-20 ENCOUNTER — Other Ambulatory Visit: Payer: Medicare Other

## 2019-01-30 ENCOUNTER — Other Ambulatory Visit: Payer: Self-pay

## 2019-01-30 ENCOUNTER — Ambulatory Visit: Payer: Medicare Other | Attending: Internal Medicine

## 2019-01-30 DIAGNOSIS — M6281 Muscle weakness (generalized): Secondary | ICD-10-CM | POA: Insufficient documentation

## 2019-01-30 DIAGNOSIS — R262 Difficulty in walking, not elsewhere classified: Secondary | ICD-10-CM | POA: Diagnosis present

## 2019-01-30 DIAGNOSIS — R2681 Unsteadiness on feet: Secondary | ICD-10-CM | POA: Insufficient documentation

## 2019-01-30 DIAGNOSIS — R2689 Other abnormalities of gait and mobility: Secondary | ICD-10-CM | POA: Insufficient documentation

## 2019-01-30 DIAGNOSIS — R278 Other lack of coordination: Secondary | ICD-10-CM | POA: Insufficient documentation
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
## 2019-02-02 ENCOUNTER — Other Ambulatory Visit: Payer: Self-pay

## 2019-02-02 ENCOUNTER — Ambulatory Visit: Payer: Medicare Other

## 2019-02-02 VITALS — BP 139/58 | HR 61

## 2019-02-02 DIAGNOSIS — R2681 Unsteadiness on feet: Secondary | ICD-10-CM

## 2019-02-02 DIAGNOSIS — R262 Difficulty in walking, not elsewhere classified: Secondary | ICD-10-CM

## 2019-02-02 DIAGNOSIS — M6281 Muscle weakness (generalized): Secondary | ICD-10-CM
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
## 2019-02-05 ENCOUNTER — Ambulatory Visit: Payer: Medicare Other

## 2019-02-09 ENCOUNTER — Ambulatory Visit: Payer: Medicare Other | Admitting: Cardiology

## 2019-02-09 ENCOUNTER — Other Ambulatory Visit: Payer: Self-pay

## 2019-02-09 ENCOUNTER — Other Ambulatory Visit: Payer: Self-pay | Admitting: Cardiology

## 2019-02-09 ENCOUNTER — Ambulatory Visit: Payer: Medicare Other

## 2019-02-09 ENCOUNTER — Ambulatory Visit (INDEPENDENT_AMBULATORY_CARE_PROVIDER_SITE_OTHER): Payer: Medicare Other

## 2019-02-09 DIAGNOSIS — I714 Abdominal aortic aneurysm, without rupture, unspecified: Secondary | ICD-10-CM

## 2019-02-09 DIAGNOSIS — I6523 Occlusion and stenosis of bilateral carotid arteries: Secondary | ICD-10-CM | POA: Diagnosis not present

## 2019-02-12 ENCOUNTER — Encounter: Payer: Self-pay | Admitting: Physical Therapy

## 2019-02-12 ENCOUNTER — Ambulatory Visit: Payer: Medicare Other | Admitting: Physical Therapy

## 2019-02-12 ENCOUNTER — Other Ambulatory Visit: Payer: Self-pay

## 2019-02-12 DIAGNOSIS — R278 Other lack of coordination: Secondary | ICD-10-CM

## 2019-02-12 DIAGNOSIS — M6281 Muscle weakness (generalized): Secondary | ICD-10-CM | POA: Diagnosis not present

## 2019-02-12 DIAGNOSIS — R2681 Unsteadiness on feet: Secondary | ICD-10-CM

## 2019-02-12 DIAGNOSIS — R262 Difficulty in walking, not elsewhere classified: Secondary | ICD-10-CM
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
## 2019-02-16 ENCOUNTER — Other Ambulatory Visit: Payer: Self-pay

## 2019-02-16 ENCOUNTER — Ambulatory Visit: Payer: Medicare Other

## 2019-02-16 DIAGNOSIS — R2689 Other abnormalities of gait and mobility: Secondary | ICD-10-CM

## 2019-02-16 DIAGNOSIS — R278 Other lack of coordination: Secondary | ICD-10-CM

## 2019-02-16 DIAGNOSIS — R262 Difficulty in walking, not elsewhere classified: Secondary | ICD-10-CM

## 2019-02-16 DIAGNOSIS — M6281 Muscle weakness (generalized): Secondary | ICD-10-CM

## 2019-02-16 DIAGNOSIS — R2681 Unsteadiness on feet: Secondary | ICD-10-CM
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
## 2019-02-19 ENCOUNTER — Other Ambulatory Visit: Payer: Self-pay

## 2019-02-19 ENCOUNTER — Encounter: Payer: Self-pay | Admitting: Cardiology

## 2019-02-19 ENCOUNTER — Ambulatory Visit (INDEPENDENT_AMBULATORY_CARE_PROVIDER_SITE_OTHER): Payer: Medicare Other | Admitting: Cardiology

## 2019-02-19 VITALS — BP 138/70 | HR 74 | Temp 98.2°F | Ht 71.0 in | Wt 185.0 lb

## 2019-02-19 DIAGNOSIS — I714 Abdominal aortic aneurysm, without rupture, unspecified: Secondary | ICD-10-CM

## 2019-02-19 DIAGNOSIS — I4821 Permanent atrial fibrillation: Secondary | ICD-10-CM

## 2019-02-19 DIAGNOSIS — I6523 Occlusion and stenosis of bilateral carotid arteries: Secondary | ICD-10-CM

## 2019-02-19 DIAGNOSIS — Z951 Presence of aortocoronary bypass graft: Secondary | ICD-10-CM

## 2019-02-19 DIAGNOSIS — I2510 Atherosclerotic heart disease of native coronary artery without angina pectoris: Secondary | ICD-10-CM | POA: Diagnosis not present
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
## 2019-02-23 ENCOUNTER — Encounter: Payer: Self-pay | Admitting: Neurology

## 2019-02-23 ENCOUNTER — Ambulatory Visit (INDEPENDENT_AMBULATORY_CARE_PROVIDER_SITE_OTHER): Payer: Medicare Other | Admitting: Neurology

## 2019-02-23 ENCOUNTER — Other Ambulatory Visit: Payer: Self-pay

## 2019-02-23 VITALS — BP 116/78 | HR 67 | Ht 71.0 in | Wt 190.0 lb

## 2019-02-23 DIAGNOSIS — R441 Visual hallucinations: Secondary | ICD-10-CM | POA: Diagnosis not present

## 2019-02-23 DIAGNOSIS — F0280 Dementia in other diseases classified elsewhere without behavioral disturbance: Secondary | ICD-10-CM

## 2019-02-23 DIAGNOSIS — G20 Parkinson's disease: Secondary | ICD-10-CM

## 2019-02-23 MED ORDER — RIVASTIGMINE TARTRATE 1.5 MG PO CAPS: 1.5000 mg | ORAL_CAPSULE | Freq: Two times a day (BID) | ORAL | 3 refills | Status: DC

## 2019-02-23 NOTE — Patient Instructions (Signed)
Your memory scores have declined over time.  I would like for you to start Exelon (Generic name: Rivastigmine) 1.5 mg strength, take 1 capsule twice daily.  Side effects may include: dry eyes, dry mouth, confusion, low pulse, low blood pressure, also GI related side effects (nausea, vomiting, diarrhea, constipation), headaches; rare side effects may include hallucinations and seizures.   Please call in about a month for an update as to how you tolerate this medication, you can also email [place] through my chart.  We will consider increasing your Exelon to 3 mg twice daily at the time.  For your Parkinson's related hallucinations and delusions we will consider at a later date:  Nuplazid, which is specifically approved by the FDA for this indication.
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
## 2019-02-24 ENCOUNTER — Other Ambulatory Visit: Payer: Self-pay

## 2019-02-24 ENCOUNTER — Ambulatory Visit: Payer: Medicare Other | Attending: Internal Medicine

## 2019-02-24 DIAGNOSIS — I69318 Other symptoms and signs involving cognitive functions following cerebral infarction: Secondary | ICD-10-CM | POA: Diagnosis present

## 2019-02-24 DIAGNOSIS — R262 Difficulty in walking, not elsewhere classified: Secondary | ICD-10-CM | POA: Insufficient documentation

## 2019-02-24 DIAGNOSIS — R278 Other lack of coordination: Secondary | ICD-10-CM | POA: Diagnosis present

## 2019-02-24 DIAGNOSIS — R2689 Other abnormalities of gait and mobility: Secondary | ICD-10-CM | POA: Insufficient documentation

## 2019-02-24 DIAGNOSIS — M6281 Muscle weakness (generalized): Secondary | ICD-10-CM | POA: Diagnosis present

## 2019-02-24 DIAGNOSIS — R2681 Unsteadiness on feet: Secondary | ICD-10-CM | POA: Insufficient documentation
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
## 2019-02-26 ENCOUNTER — Ambulatory Visit: Payer: Medicare Other

## 2019-02-26 ENCOUNTER — Other Ambulatory Visit: Payer: Self-pay

## 2019-02-26 DIAGNOSIS — M6281 Muscle weakness (generalized): Secondary | ICD-10-CM | POA: Diagnosis not present

## 2019-02-26 DIAGNOSIS — R262 Difficulty in walking, not elsewhere classified: Secondary | ICD-10-CM

## 2019-02-26 DIAGNOSIS — R2681 Unsteadiness on feet: Secondary | ICD-10-CM

## 2019-02-26 DIAGNOSIS — R2689 Other abnormalities of gait and mobility: Secondary | ICD-10-CM

## 2019-02-26 DIAGNOSIS — I69318 Other symptoms and signs involving cognitive functions following cerebral infarction: Secondary | ICD-10-CM

## 2019-02-26 DIAGNOSIS — R278 Other lack of coordination: Secondary | ICD-10-CM
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
## 2019-03-03 ENCOUNTER — Ambulatory Visit: Payer: Medicare Other

## 2019-03-03 ENCOUNTER — Other Ambulatory Visit: Payer: Self-pay

## 2019-03-03 DIAGNOSIS — M6281 Muscle weakness (generalized): Secondary | ICD-10-CM

## 2019-03-03 DIAGNOSIS — R2689 Other abnormalities of gait and mobility: Secondary | ICD-10-CM

## 2019-03-03 DIAGNOSIS — R2681 Unsteadiness on feet: Secondary | ICD-10-CM

## 2019-03-03 DIAGNOSIS — R262 Difficulty in walking, not elsewhere classified: Secondary | ICD-10-CM
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
## 2019-03-05 ENCOUNTER — Other Ambulatory Visit: Payer: Self-pay

## 2019-03-05 ENCOUNTER — Ambulatory Visit: Payer: Medicare Other

## 2019-03-05 DIAGNOSIS — R2689 Other abnormalities of gait and mobility: Secondary | ICD-10-CM

## 2019-03-05 DIAGNOSIS — R2681 Unsteadiness on feet: Secondary | ICD-10-CM

## 2019-03-05 DIAGNOSIS — R262 Difficulty in walking, not elsewhere classified: Secondary | ICD-10-CM

## 2019-03-05 DIAGNOSIS — M6281 Muscle weakness (generalized): Secondary | ICD-10-CM | POA: Diagnosis not present
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
## 2019-03-10 ENCOUNTER — Other Ambulatory Visit: Payer: Self-pay

## 2019-03-10 ENCOUNTER — Ambulatory Visit: Payer: Medicare Other

## 2019-03-10 DIAGNOSIS — M6281 Muscle weakness (generalized): Secondary | ICD-10-CM | POA: Diagnosis not present

## 2019-03-10 DIAGNOSIS — R2681 Unsteadiness on feet: Secondary | ICD-10-CM

## 2019-03-10 DIAGNOSIS — R262 Difficulty in walking, not elsewhere classified: Secondary | ICD-10-CM
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
## 2019-03-12 ENCOUNTER — Other Ambulatory Visit: Payer: Self-pay

## 2019-03-12 ENCOUNTER — Ambulatory Visit: Payer: Medicare Other

## 2019-03-12 DIAGNOSIS — R2681 Unsteadiness on feet: Secondary | ICD-10-CM

## 2019-03-12 DIAGNOSIS — R262 Difficulty in walking, not elsewhere classified: Secondary | ICD-10-CM

## 2019-03-12 DIAGNOSIS — M6281 Muscle weakness (generalized): Secondary | ICD-10-CM
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
## 2019-03-17 ENCOUNTER — Other Ambulatory Visit: Payer: Self-pay

## 2019-03-17 ENCOUNTER — Ambulatory Visit: Payer: Medicare Other

## 2019-03-17 DIAGNOSIS — R262 Difficulty in walking, not elsewhere classified: Secondary | ICD-10-CM

## 2019-03-17 DIAGNOSIS — M6281 Muscle weakness (generalized): Secondary | ICD-10-CM | POA: Diagnosis not present

## 2019-03-17 DIAGNOSIS — R2681 Unsteadiness on feet: Secondary | ICD-10-CM

## 2019-03-17 DIAGNOSIS — R2689 Other abnormalities of gait and mobility: Secondary | ICD-10-CM
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
## 2019-03-19 ENCOUNTER — Ambulatory Visit: Payer: Medicare Other

## 2019-03-24 ENCOUNTER — Ambulatory Visit: Payer: Medicare Other | Attending: Internal Medicine

## 2019-03-24 ENCOUNTER — Other Ambulatory Visit: Payer: Self-pay

## 2019-03-24 DIAGNOSIS — R2681 Unsteadiness on feet: Secondary | ICD-10-CM | POA: Diagnosis present

## 2019-03-24 DIAGNOSIS — R262 Difficulty in walking, not elsewhere classified: Secondary | ICD-10-CM | POA: Insufficient documentation

## 2019-03-24 DIAGNOSIS — M6281 Muscle weakness (generalized): Secondary | ICD-10-CM | POA: Insufficient documentation
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
## 2019-03-26 ENCOUNTER — Telehealth: Payer: Self-pay | Admitting: Neurology

## 2019-03-26 ENCOUNTER — Other Ambulatory Visit: Payer: Self-pay

## 2019-03-26 ENCOUNTER — Ambulatory Visit: Payer: Medicare Other

## 2019-03-26 DIAGNOSIS — R2681 Unsteadiness on feet: Secondary | ICD-10-CM

## 2019-03-26 DIAGNOSIS — M6281 Muscle weakness (generalized): Secondary | ICD-10-CM

## 2019-03-26 DIAGNOSIS — R262 Difficulty in walking, not elsewhere classified: Secondary | ICD-10-CM

## 2019-03-26 NOTE — Telephone Encounter (Signed)
I called pts wife to give her DR Rexene Alberts recommendations. I stated its recommend to take exelon 1.5 at night for now only. It can be  increase slowly and to call back in two weeks of his progress. Pts wife verbalized understanding.

## 2019-03-26 NOTE — Telephone Encounter (Signed)
I called pts wife about husband having issues with the exelon medication. She stated after 3 to 4 days pt is lethargic and sleeping more. She gives him the medication at 1pm and 7pm because he takes a lot of meds in the am. I stated the directions state one capsule twice a day. Pts wife stated she thought someone told her to give him the medication at 1pm and 7pm. I stated a message will be sent to Dr. Rexene Alberts. The wife verbalized understanding.

## 2019-03-26 NOTE — Telephone Encounter (Signed)
Pt wife(on DPR)has called MK:[id number] (EXELON) 1.5 MG capsule she is wanting Dr Rexene Alberts to know that pt is not doing very well on this medication.  She is asking for a call to discuss
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
## 2019-03-26 NOTE — Telephone Encounter (Signed)
It is a twice daily medication and typically taken in the morning and evening.  Since he is sleepy from it, we can scale back and just titrate him more slowly, therefore, please call his wife and advised her to just give him the exelon 1.5 mg capsule at night only for now.  We can increase it more slowly. Please encourage her to update [place] in the next week or 2.

## 2019-03-31 ENCOUNTER — Other Ambulatory Visit: Payer: Self-pay

## 2019-03-31 ENCOUNTER — Ambulatory Visit: Payer: Medicare Other

## 2019-03-31 DIAGNOSIS — R2681 Unsteadiness on feet: Secondary | ICD-10-CM

## 2019-03-31 DIAGNOSIS — M6281 Muscle weakness (generalized): Secondary | ICD-10-CM | POA: Diagnosis not present

## 2019-03-31 DIAGNOSIS — R262 Difficulty in walking, not elsewhere classified: Secondary | ICD-10-CM
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
## 2019-04-07 ENCOUNTER — Ambulatory Visit: Payer: Medicare Other

## 2019-04-07 ENCOUNTER — Other Ambulatory Visit: Payer: Self-pay

## 2019-04-07 DIAGNOSIS — R2681 Unsteadiness on feet: Secondary | ICD-10-CM

## 2019-04-07 DIAGNOSIS — R262 Difficulty in walking, not elsewhere classified: Secondary | ICD-10-CM

## 2019-04-07 DIAGNOSIS — M6281 Muscle weakness (generalized): Secondary | ICD-10-CM | POA: Diagnosis not present
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
## 2019-04-09 ENCOUNTER — Other Ambulatory Visit: Payer: Self-pay

## 2019-04-09 ENCOUNTER — Ambulatory Visit: Payer: Medicare Other

## 2019-04-09 DIAGNOSIS — R262 Difficulty in walking, not elsewhere classified: Secondary | ICD-10-CM

## 2019-04-09 DIAGNOSIS — M6281 Muscle weakness (generalized): Secondary | ICD-10-CM

## 2019-04-09 DIAGNOSIS — R2681 Unsteadiness on feet: Secondary | ICD-10-CM
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
## 2019-04-14 ENCOUNTER — Ambulatory Visit: Payer: Medicare Other

## 2019-04-21 ENCOUNTER — Other Ambulatory Visit: Payer: Self-pay

## 2019-04-21 ENCOUNTER — Ambulatory Visit: Payer: Medicare Other | Attending: Internal Medicine

## 2019-04-21 DIAGNOSIS — M6281 Muscle weakness (generalized): Secondary | ICD-10-CM

## 2019-04-21 DIAGNOSIS — G8929 Other chronic pain: Secondary | ICD-10-CM | POA: Diagnosis present

## 2019-04-21 DIAGNOSIS — R2681 Unsteadiness on feet: Secondary | ICD-10-CM

## 2019-04-21 DIAGNOSIS — R262 Difficulty in walking, not elsewhere classified: Secondary | ICD-10-CM

## 2019-04-21 DIAGNOSIS — R2689 Other abnormalities of gait and mobility: Secondary | ICD-10-CM | POA: Insufficient documentation

## 2019-04-21 DIAGNOSIS — M25561 Pain in right knee: Secondary | ICD-10-CM | POA: Diagnosis present
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
## 2019-04-23 ENCOUNTER — Other Ambulatory Visit: Payer: Self-pay

## 2019-04-23 ENCOUNTER — Ambulatory Visit: Payer: Medicare Other

## 2019-04-23 DIAGNOSIS — G8929 Other chronic pain: Secondary | ICD-10-CM

## 2019-04-23 DIAGNOSIS — M25561 Pain in right knee: Secondary | ICD-10-CM

## 2019-04-23 DIAGNOSIS — R2681 Unsteadiness on feet: Secondary | ICD-10-CM

## 2019-04-23 DIAGNOSIS — M6281 Muscle weakness (generalized): Secondary | ICD-10-CM

## 2019-04-23 DIAGNOSIS — R262 Difficulty in walking, not elsewhere classified: Secondary | ICD-10-CM

## 2019-04-23 DIAGNOSIS — R2689 Other abnormalities of gait and mobility: Secondary | ICD-10-CM
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
## 2019-04-27 ENCOUNTER — Ambulatory Visit: Payer: Medicare Other

## 2019-04-27 ENCOUNTER — Other Ambulatory Visit: Payer: Self-pay

## 2019-04-27 DIAGNOSIS — M6281 Muscle weakness (generalized): Secondary | ICD-10-CM

## 2019-04-27 DIAGNOSIS — R262 Difficulty in walking, not elsewhere classified: Secondary | ICD-10-CM

## 2019-04-27 DIAGNOSIS — R2681 Unsteadiness on feet: Secondary | ICD-10-CM
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
## 2019-04-29 ENCOUNTER — Ambulatory Visit: Payer: Medicare Other

## 2019-04-29 ENCOUNTER — Other Ambulatory Visit: Payer: Self-pay

## 2019-04-29 DIAGNOSIS — R262 Difficulty in walking, not elsewhere classified: Secondary | ICD-10-CM

## 2019-04-29 DIAGNOSIS — R2681 Unsteadiness on feet: Secondary | ICD-10-CM

## 2019-04-29 DIAGNOSIS — M6281 Muscle weakness (generalized): Secondary | ICD-10-CM | POA: Diagnosis not present
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
## 2019-05-04 ENCOUNTER — Ambulatory Visit: Payer: Medicare Other

## 2019-05-06 ENCOUNTER — Ambulatory Visit: Payer: Medicare Other

## 2019-05-11 ENCOUNTER — Other Ambulatory Visit: Payer: Self-pay

## 2019-05-11 ENCOUNTER — Ambulatory Visit: Payer: Medicare Other

## 2019-05-11 DIAGNOSIS — E785 Hyperlipidemia, unspecified: Secondary | ICD-10-CM

## 2019-05-12 ENCOUNTER — Other Ambulatory Visit: Payer: Self-pay

## 2019-05-12 MED ORDER — TAMSULOSIN HCL 0.4 MG PO CAPS: 0.4000 mg | ORAL_CAPSULE | Freq: Every day | ORAL | 3 refills | Status: DC

## 2019-05-13 ENCOUNTER — Ambulatory Visit: Payer: Medicare Other

## 2019-05-18 ENCOUNTER — Ambulatory Visit: Payer: Medicare Other

## 2019-05-18 ENCOUNTER — Other Ambulatory Visit: Payer: Self-pay

## 2019-05-18 VITALS — BP 114/61 | HR 60

## 2019-05-18 DIAGNOSIS — M6281 Muscle weakness (generalized): Secondary | ICD-10-CM

## 2019-05-18 DIAGNOSIS — R2681 Unsteadiness on feet: Secondary | ICD-10-CM

## 2019-05-18 DIAGNOSIS — R262 Difficulty in walking, not elsewhere classified: Secondary | ICD-10-CM
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
## 2019-05-20 ENCOUNTER — Ambulatory Visit: Payer: Medicare Other

## 2019-05-25 ENCOUNTER — Ambulatory Visit: Payer: Medicare Other

## 2019-05-27 ENCOUNTER — Ambulatory Visit: Payer: Medicare Other

## 2019-05-28 ENCOUNTER — Ambulatory Visit: Payer: Medicare Other | Admitting: Neurology

## 2019-06-01 ENCOUNTER — Ambulatory Visit: Payer: Medicare Other

## 2019-06-03 ENCOUNTER — Ambulatory Visit: Payer: Medicare Other

## 2019-06-08 ENCOUNTER — Ambulatory Visit: Payer: Medicare Other

## 2019-06-10 ENCOUNTER — Ambulatory Visit: Payer: Medicare Other

## 2019-06-15 ENCOUNTER — Ambulatory Visit: Payer: Medicare Other

## 2019-06-17 ENCOUNTER — Ambulatory Visit: Payer: Medicare Other

## 2019-06-22 DIAGNOSIS — F0390 Unspecified dementia without behavioral disturbance: Secondary | ICD-10-CM

## 2019-06-22 HISTORY — DX: Unspecified dementia, unspecified severity, without behavioral disturbance, psychotic disturbance, mood disturbance, and anxiety: F03.90

## 2019-06-24 ENCOUNTER — Ambulatory Visit: Payer: Medicare Other

## 2019-06-25 ENCOUNTER — Encounter: Payer: Self-pay | Admitting: Neurology

## 2019-06-25 ENCOUNTER — Other Ambulatory Visit: Payer: Self-pay

## 2019-06-25 ENCOUNTER — Ambulatory Visit (INDEPENDENT_AMBULATORY_CARE_PROVIDER_SITE_OTHER): Payer: Medicare Other | Admitting: Neurology

## 2019-06-25 VITALS — BP 123/72 | HR 75 | Temp 97.0°F | Ht 71.0 in | Wt 196.0 lb

## 2019-06-25 DIAGNOSIS — F0280 Dementia in other diseases classified elsewhere without behavioral disturbance: Secondary | ICD-10-CM

## 2019-06-25 DIAGNOSIS — G20 Parkinson's disease: Secondary | ICD-10-CM | POA: Diagnosis not present

## 2019-06-25 DIAGNOSIS — R441 Visual hallucinations: Secondary | ICD-10-CM | POA: Diagnosis not present

## 2019-06-25 DIAGNOSIS — G479 Sleep disorder, unspecified: Secondary | ICD-10-CM | POA: Diagnosis not present

## 2019-06-25 MED ORDER — CARBIDOPA-LEVODOPA ER 50-200 MG PO TBCR: 1.0000 | EXTENDED_RELEASE_TABLET | Freq: Four times a day (QID) | ORAL | 3 refills | Status: DC

## 2019-06-25 MED ORDER — RIVASTIGMINE TARTRATE 1.5 MG PO CAPS: 1.5000 mg | ORAL_CAPSULE | Freq: Two times a day (BID) | ORAL | 3 refills | Status: DC
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
## 2019-06-25 NOTE — Patient Instructions (Signed)
As discussed, we will continue with your Sinemet long-acting 1 pill 4 times a day.  We will try to get your Exelon capsules increased to 2 pills at night.  If you have side effects such as daytime sleepiness or feeling lethargic, we may have to scale back to 1 pill each night.  Please give [place] a phone update in about a month, we can also consider switching you to the Exelon patch instead.  Follow-up in 3 months.

## 2019-07-02 ENCOUNTER — Ambulatory Visit: Payer: Medicare Other

## 2019-07-07 ENCOUNTER — Ambulatory Visit: Payer: Medicare Other

## 2019-07-09 ENCOUNTER — Ambulatory Visit: Payer: Medicare Other

## 2019-07-09 ENCOUNTER — Encounter: Payer: Self-pay | Admitting: Neurology

## 2019-07-15 ENCOUNTER — Ambulatory Visit: Payer: Medicare Other

## 2019-07-21 ENCOUNTER — Ambulatory Visit: Payer: Medicare Other

## 2019-07-28 ENCOUNTER — Ambulatory Visit: Payer: Medicare Other

## 2019-08-04 ENCOUNTER — Ambulatory Visit: Payer: Medicare Other

## 2019-08-06 ENCOUNTER — Ambulatory Visit: Payer: Medicare Other

## 2019-08-10 ENCOUNTER — Other Ambulatory Visit: Payer: Self-pay | Admitting: *Deleted

## 2019-08-10 MED ORDER — TAMSULOSIN HCL 0.4 MG PO CAPS: 0.4000 mg | ORAL_CAPSULE | Freq: Every day | ORAL | 3 refills | Status: DC

## 2019-08-11 ENCOUNTER — Other Ambulatory Visit: Payer: Self-pay

## 2019-08-11 ENCOUNTER — Ambulatory Visit: Payer: Medicare Other

## 2019-08-11 DIAGNOSIS — I6523 Occlusion and stenosis of bilateral carotid arteries: Secondary | ICD-10-CM

## 2019-08-11 DIAGNOSIS — I714 Abdominal aortic aneurysm, without rupture, unspecified: Secondary | ICD-10-CM

## 2019-08-13 ENCOUNTER — Ambulatory Visit: Payer: Medicare Other

## 2019-08-15 ENCOUNTER — Other Ambulatory Visit: Payer: Self-pay | Admitting: Cardiology

## 2019-08-15 DIAGNOSIS — I714 Abdominal aortic aneurysm, without rupture, unspecified: Secondary | ICD-10-CM

## 2019-08-15 DIAGNOSIS — I6523 Occlusion and stenosis of bilateral carotid arteries: Secondary | ICD-10-CM

## 2019-08-18 ENCOUNTER — Ambulatory Visit: Payer: Medicare Other

## 2019-08-20 ENCOUNTER — Ambulatory Visit: Payer: Medicare Other

## 2019-08-21 ENCOUNTER — Ambulatory Visit: Payer: Medicare Other | Admitting: Cardiology

## 2019-08-25 ENCOUNTER — Ambulatory Visit: Payer: Medicare Other

## 2019-08-27 ENCOUNTER — Ambulatory Visit: Payer: Medicare Other | Admitting: Cardiology

## 2019-09-01 ENCOUNTER — Ambulatory Visit: Payer: Medicare Other

## 2019-09-03 ENCOUNTER — Ambulatory Visit: Payer: Medicare Other

## 2019-09-08 ENCOUNTER — Ambulatory Visit: Payer: Medicare Other

## 2019-09-10 ENCOUNTER — Ambulatory Visit: Payer: Medicare Other

## 2019-09-15 ENCOUNTER — Ambulatory Visit: Payer: Medicare Other

## 2019-09-17 ENCOUNTER — Ambulatory Visit: Payer: Medicare Other

## 2019-09-23 ENCOUNTER — Other Ambulatory Visit: Payer: Self-pay

## 2019-09-23 ENCOUNTER — Ambulatory Visit: Payer: Medicare Other | Admitting: Cardiology

## 2019-09-23 ENCOUNTER — Ambulatory Visit (INDEPENDENT_AMBULATORY_CARE_PROVIDER_SITE_OTHER): Payer: Medicare Other | Admitting: Neurology

## 2019-09-23 ENCOUNTER — Encounter: Payer: Self-pay | Admitting: Neurology

## 2019-09-23 VITALS — BP 106/76 | HR 76 | Temp 98.1°F | Ht 71.0 in | Wt 194.3 lb

## 2019-09-23 DIAGNOSIS — R441 Visual hallucinations: Secondary | ICD-10-CM | POA: Diagnosis not present

## 2019-09-23 DIAGNOSIS — R197 Diarrhea, unspecified: Secondary | ICD-10-CM | POA: Diagnosis not present

## 2019-09-23 DIAGNOSIS — F0280 Dementia in other diseases classified elsewhere without behavioral disturbance: Secondary | ICD-10-CM

## 2019-09-23 DIAGNOSIS — G20 Parkinson's disease: Secondary | ICD-10-CM

## 2019-09-23 MED ORDER — CARBIDOPA-LEVODOPA ER 50-200 MG PO TBCR: 1.0000 | EXTENDED_RELEASE_TABLET | Freq: Every day | ORAL | 3 refills | Status: DC
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
## 2019-09-23 NOTE — Patient Instructions (Addendum)
Because of ongoing issues with soft stools and multiple bowel movements during the day, please stop Exelon altogether.  Please give [place] an update in the next couple of weeks as to how the bowel habits have changed since stopping the Exelon.  Also, we will increase Sinemet CR to 5 pills daily mainly at 9 AM, 12, 3 PM, 6 PM and 9 PM.  Please monitor for increase in sleepiness or lightheadedness/dizziness.  Please continue to hydrate well with water.  Talk to your primary care physician or nurse practitioner about the possibility of doing the INR by drive-through, rather than having to go in every time.  If the loose stools improve, we can consider another medication for memory down the road.  I would like for you to try Namenda (generic name: Memantine) in the near future.
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
## 2019-10-21 ENCOUNTER — Ambulatory Visit: Payer: Medicare Other | Admitting: Cardiology

## 2019-10-21 ENCOUNTER — Encounter: Payer: Self-pay | Admitting: Cardiology

## 2019-10-21 ENCOUNTER — Other Ambulatory Visit: Payer: Self-pay

## 2019-10-21 VITALS — BP 120/69 | HR 79 | Resp 16 | Ht 71.0 in | Wt 194.0 lb

## 2019-10-21 DIAGNOSIS — I714 Abdominal aortic aneurysm, without rupture, unspecified: Secondary | ICD-10-CM

## 2019-10-21 DIAGNOSIS — I25810 Atherosclerosis of coronary artery bypass graft(s) without angina pectoris: Secondary | ICD-10-CM

## 2019-10-21 DIAGNOSIS — Z66 Do not resuscitate: Secondary | ICD-10-CM

## 2019-10-21 DIAGNOSIS — K909 Intestinal malabsorption, unspecified: Secondary | ICD-10-CM

## 2019-10-21 DIAGNOSIS — I4821 Permanent atrial fibrillation: Secondary | ICD-10-CM

## 2019-10-21 DIAGNOSIS — I6523 Occlusion and stenosis of bilateral carotid arteries: Secondary | ICD-10-CM

## 2019-11-03 ENCOUNTER — Ambulatory Visit (INDEPENDENT_AMBULATORY_CARE_PROVIDER_SITE_OTHER): Payer: Medicare Other | Admitting: Gastroenterology

## 2019-11-03 ENCOUNTER — Other Ambulatory Visit: Payer: Self-pay

## 2019-11-03 VITALS — BP 130/78 | HR 86 | Temp 97.7°F | Ht 71.0 in | Wt 194.0 lb

## 2019-11-03 DIAGNOSIS — R197 Diarrhea, unspecified: Secondary | ICD-10-CM | POA: Diagnosis not present
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
## 2019-11-03 NOTE — Addendum Note (Signed)
Addended by: Daisy Blossom on: 11/03/2019 03:15 PM   Modules accepted: Orders

## 2019-11-06 ENCOUNTER — Encounter: Payer: Self-pay | Admitting: Gastroenterology

## 2019-11-09 ENCOUNTER — Encounter: Payer: Self-pay | Admitting: Neurology

## 2019-11-09 LAB — GI PROFILE, STOOL, PCR

## 2019-11-09 LAB — C DIFFICILE, CYTOTOXIN B

## 2019-11-09 LAB — C DIFFICILE TOXINS A+B W/RFLX: C difficile Toxins A+B, EIA: NEGATIVE

## 2019-11-12 ENCOUNTER — Telehealth (INDEPENDENT_AMBULATORY_CARE_PROVIDER_SITE_OTHER): Payer: Medicare Other | Admitting: Gastroenterology

## 2019-11-12 DIAGNOSIS — R197 Diarrhea, unspecified: Secondary | ICD-10-CM | POA: Diagnosis not present

## 2019-11-12 NOTE — Progress Notes (Signed)
Gregory [name] , MD [address]  [address]  [place], [place] [address]  Main: [phone]  Fax: [phone]   Primary Care Physician: Fatima Sanger, FNP  Virtual Visit via Telephone Note  I connected with patient on 11/12/19 at  1:00 PM EDT by telephone and verified that I am speaking with the correct person using two identifiers.   I discussed the limitations, risks, security and privacy concerns of performing an evaluation and management service by telephone and the availability of in person appointments. I also discussed with the patient that there may be a patient responsible charge related to this service. The patient expressed understanding and agreed to proceed.  Location of Patient: Home Location of Provider: Home Persons involved: Patient and provider only   History of Present Illness:  Follow up for diarrhea   HPI: Gregory [name] is a 79 y.o. male     Summary of history :  Initially referred and seen on 11/03/2019 for diarrhea ongoing for 3 to 4 weeks duration.  Last colonoscopy was over 10 years back.  Nonbloody watery.  Began after commencing on Sinemet which was eventually stopped but the diarrhea persisted and was referred to see me.  Interval history 11/03/2019-11/12/2019  Labs 11/04/2019: GI PCR profile and C. difficile toxin negative.  Spoke with family.  Diarrhea has resolved and now has constipation.  Off Sinemet.  On a brat diet.  Has taken as needed Imodium.  Current Outpatient Medications  Medication Sig Dispense Refill  . atorvastatin (LIPITOR) 20 MG tablet Take 1 tablet (20 mg total) by mouth once a week. 90 tablet 3  . carbidopa-levodopa (SINEMET CR) 50-200 MG tablet Take 1 tablet by mouth 5 (five) times daily. Take at 9 AM, 12, 3 PM, 6 PM, and 9 PM. 450 tablet 3  . levothyroxine (SYNTHROID, LEVOTHROID) 50 MCG tablet Take 50 mcg by mouth daily.    . tamsulosin (FLOMAX) 0.4 MG CAPS capsule Take 1 capsule (0.4 mg total) by mouth daily  after supper. 90 capsule 3  . warfarin (COUMADIN) 5 MG tablet Take 5 mg by mouth as directed. And an additional 1 1/2 tab on Tue, Thur, and Sat  2   No current facility-administered medications for this visit.    Allergies as of 11/12/2019  . (No Known Allergies)    Review of Systems:    All systems reviewed and negative except where noted in HPI.   Observations/Objective:  Labs: CMP     Component Value Date/Time   NA 140 12/13/2016 0322   K 3.9 12/13/2016 0322   CL 106 12/13/2016 0322   CO2 27 12/13/2016 0322   GLUCOSE 101 (H) 12/13/2016 0322   BUN 9 12/13/2016 0322   CREATININE 1.10 12/13/2016 0322   CALCIUM 8.8 (L) 12/13/2016 0322   PROT 7.5 12/07/2016 1520   ALBUMIN 3.9 12/07/2016 1520   AST 15 12/07/2016 1520   ALT <5 (L) 12/07/2016 1520   ALKPHOS 90 12/07/2016 1520   BILITOT 1.2 12/07/2016 1520   GFRNONAA >60 12/13/2016 0322   GFRAA >60 12/13/2016 0322   Lab Results  Component Value Date   WBC 9.5 12/13/2016   HGB 9.7 (L) 12/13/2016   HCT 31.4 (L) 12/13/2016   MCV 84.6 12/13/2016   PLT 215 12/13/2016    Imaging Studies: No results found.  Assessment and Plan:   Gregory [name] is a 79 y.o. y/o male here to discuss the next steps evaluate for diarrhea ongoing for over 4  weeks with negative stool studies.  Diarrhea resolved and now is possibly constipated.  Plan 1.  Watch today does not have a bowel movement commence on MiraLAX 1 capful daily tomorrow. 2.  Recommence Metamucil 3.  If diarrhea has truly resolved then consider restarting Sinemet if needed by neurology. 4.  Follow-up as needed    I discussed the assessment and treatment plan with the patient. The patient was provided an opportunity to ask questions and all were answered. The patient agreed with the plan and demonstrated an understanding of the instructions.   The patient was advised to call back or seek an in-person evaluation if the symptoms worsen or if the condition fails to improve  as anticipated.  I provided 12 minutes of non-face-to-face time during this encounter.  Dr Jonathon Bellows MD,MRCP [facility]) Gastroenterology/Hepatology Pager: [phone]   Speech recognition software was used to dictate this note.

## 2019-11-18 ENCOUNTER — Telehealth: Payer: Self-pay

## 2019-11-18 NOTE — Telephone Encounter (Signed)
Pt's wife called to inform Dr. Tobi Bastos that pt is still experiencing diarrhea but also has formed solid stool at the same time and states the imodium has not helped much.

## 2019-11-19 NOTE — Telephone Encounter (Signed)
What dose imodium is he [place]

## 2019-11-19 NOTE — Telephone Encounter (Signed)
Increase to 2 mg 4 times a day and call if doesn't work by Monday

## 2019-12-01 NOTE — Telephone Encounter (Signed)
Spoke with pt's wife for an update on pt's condition, pt's wife states pt has began to have soft mud-like stool so he only takes the imodium prn. Pt has began taking metamucil once daily which has helped decrease pt's bowel movements from 7-8 times day to 2-3 times per day. Pt's wife wants to know if Dr. Tobi Bastos recommends continuing the metamucil and also asks what type of diet is recommended?   Pt wife then stated that pt stopped taking his dementia medication Rivastigmine 3 months ago as pt's neurologist believed this medication was the cause of pt's diarrhea. Pt wife asks that since it's been 3 months and pt still has the diarrhea does that mean pt is okay to start back on the Rivastigmine?

## 2019-12-01 NOTE — Telephone Encounter (Signed)
Suggest high fiber diet  Continue metamucil Ok to try and restart dementia meds- obviously if develops severe diarrhea then will need to stop

## 2019-12-01 NOTE — Telephone Encounter (Signed)
Spoke with pt's wife and informed her of Dr. Johnney Killian recommendations. She agrees.

## 2019-12-07 ENCOUNTER — Ambulatory Visit: Payer: Medicare Other | Admitting: Gastroenterology

## 2019-12-09 ENCOUNTER — Telehealth: Payer: Self-pay

## 2019-12-09 NOTE — Telephone Encounter (Signed)
Pt's wife called to inform Dr. Tobi Bastos that pt's diarrhea was better until 2 days ago when the diarrhea returned and the imodium is not helping. Pt's wife is requesting Dr. Johnney Killian advice.

## 2019-12-10 NOTE — Telephone Encounter (Signed)
How much imodium is he taking ? Avoid all artificial sugars, sweetners

## 2019-12-13 NOTE — Telephone Encounter (Signed)
Seems he is on a high dose of imodium, would like to confirm is he on sinemet , is he taking any miralax? 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 Also ensure no sweeeteners in diet.

## 2019-12-15 ENCOUNTER — Other Ambulatory Visit: Payer: Self-pay

## 2019-12-15 MED ORDER — RIFAXIMIN 550 MG PO TABS: 550.0000 mg | ORAL_TABLET | Freq: Three times a day (TID) | ORAL | 0 refills | Status: AC

## 2019-12-31 ENCOUNTER — Ambulatory Visit: Payer: Medicare Other | Admitting: Urology

## 2020-01-27 ENCOUNTER — Ambulatory Visit: Payer: Medicare Other | Admitting: Neurology

## 2020-02-23 ENCOUNTER — Ambulatory Visit (INDEPENDENT_AMBULATORY_CARE_PROVIDER_SITE_OTHER): Payer: Medicare Other | Admitting: Neurology

## 2020-02-23 ENCOUNTER — Encounter: Payer: Self-pay | Admitting: Neurology

## 2020-02-23 VITALS — BP 122/78 | HR 76 | Ht 71.0 in | Wt 186.0 lb

## 2020-02-23 DIAGNOSIS — G20 Parkinson's disease: Secondary | ICD-10-CM | POA: Diagnosis not present

## 2020-02-23 DIAGNOSIS — F0280 Dementia in other diseases classified elsewhere without behavioral disturbance: Secondary | ICD-10-CM

## 2020-02-23 DIAGNOSIS — R441 Visual hallucinations: Secondary | ICD-10-CM

## 2020-02-23 DIAGNOSIS — R197 Diarrhea, unspecified: Secondary | ICD-10-CM
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
## 2020-02-23 NOTE — Patient Instructions (Addendum)
As discussed, we will continue with your Sinemet CR 5 times a day, your prescription is up-to-date for now. I do see where you had testing done on your stool sample in June. I think it may be a good idea to get another opinion from a different GI doctor as you have intended.  Please talk to your GI specialist if you could be a candidate for Cologuard instead of a traditional colonoscopy.  Your primary care nurse practitioner also wants to do additional stool testing. I am not sure what the cause of your diarrhea is.

## 2020-02-24 ENCOUNTER — Encounter: Payer: Self-pay | Admitting: Neurology

## 2020-03-23 ENCOUNTER — Other Ambulatory Visit: Payer: Self-pay | Admitting: Gastroenterology

## 2020-03-23 DIAGNOSIS — K529 Noninfective gastroenteritis and colitis, unspecified: Secondary | ICD-10-CM

## 2020-04-08 ENCOUNTER — Ambulatory Visit
Admission: RE | Admit: 2020-04-08 | Discharge: 2020-04-08 | Disposition: A | Discharge status: Home / Self Care | Payer: Medicare Other | Source: Ambulatory Visit | Attending: Gastroenterology | Admitting: Gastroenterology

## 2020-04-08 DIAGNOSIS — K529 Noninfective gastroenteritis and colitis, unspecified: Secondary | ICD-10-CM

## 2020-04-08 MED ORDER — IOPAMIDOL (ISOVUE-300) INJECTION 61%
100.0000 mL | Freq: Once | INTRAVENOUS | Status: AC | PRN
  Administered 2020-04-08: 100 mL via INTRAVENOUS

## 2020-04-11 ENCOUNTER — Other Ambulatory Visit: Payer: Medicare Other

## 2020-04-12 ENCOUNTER — Other Ambulatory Visit: Payer: Medicare Other

## 2020-04-21 ENCOUNTER — Ambulatory Visit: Payer: Medicare Other | Admitting: Cardiology

## 2020-04-27 ENCOUNTER — Other Ambulatory Visit: Payer: Medicare Other

## 2020-04-28 ENCOUNTER — Other Ambulatory Visit: Payer: Self-pay

## 2020-04-28 ENCOUNTER — Ambulatory Visit: Payer: Medicare Other

## 2020-04-28 DIAGNOSIS — I714 Abdominal aortic aneurysm, without rupture, unspecified: Secondary | ICD-10-CM

## 2020-04-28 DIAGNOSIS — I6523 Occlusion and stenosis of bilateral carotid arteries: Secondary | ICD-10-CM

## 2020-05-01 ENCOUNTER — Other Ambulatory Visit: Payer: Self-pay | Admitting: Cardiology

## 2020-05-01 DIAGNOSIS — I6523 Occlusion and stenosis of bilateral carotid arteries: Secondary | ICD-10-CM

## 2020-05-01 DIAGNOSIS — I714 Abdominal aortic aneurysm, without rupture, unspecified: Secondary | ICD-10-CM

## 2020-05-04 ENCOUNTER — Other Ambulatory Visit: Payer: Medicare Other

## 2020-05-11 ENCOUNTER — Other Ambulatory Visit: Payer: Medicare Other

## 2020-05-25 ENCOUNTER — Ambulatory Visit: Payer: Medicare Other | Admitting: Neurology

## 2020-06-01 ENCOUNTER — Ambulatory Visit: Payer: Medicare Other | Admitting: Cardiology

## 2020-07-22 ENCOUNTER — Other Ambulatory Visit: Payer: Self-pay | Admitting: Urology

## 2020-07-25 ENCOUNTER — Other Ambulatory Visit: Payer: Self-pay | Admitting: Internal Medicine

## 2020-07-25 ENCOUNTER — Ambulatory Visit (INDEPENDENT_AMBULATORY_CARE_PROVIDER_SITE_OTHER): Payer: Medicare Other | Admitting: Urology

## 2020-07-25 ENCOUNTER — Other Ambulatory Visit: Payer: Self-pay

## 2020-07-25 ENCOUNTER — Encounter: Payer: Self-pay | Admitting: Urology

## 2020-07-25 VITALS — BP 128/76 | HR 55 | Ht 71.0 in | Wt 187.0 lb

## 2020-07-25 DIAGNOSIS — N390 Urinary tract infection, site not specified: Secondary | ICD-10-CM | POA: Diagnosis not present

## 2020-07-25 LAB — BLADDER SCAN AMB NON-IMAGING: Scan Result: 10

## 2020-07-25 MED ORDER — NITROFURANTOIN MONOHYD MACRO 100 MG PO CAPS: 100.0000 mg | ORAL_CAPSULE | Freq: Two times a day (BID) | ORAL | 0 refills | Status: AC

## 2020-07-25 NOTE — Patient Instructions (Signed)
D-mannnose

## 2020-07-26 ENCOUNTER — Encounter: Payer: Self-pay | Admitting: Urology
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
## 2020-07-27 ENCOUNTER — Other Ambulatory Visit: Payer: Self-pay | Admitting: Urology

## 2020-07-29 ENCOUNTER — Other Ambulatory Visit: Payer: Self-pay | Admitting: Urology

## 2020-08-04 ENCOUNTER — Ambulatory Visit: Payer: Medicare Other | Admitting: Neurology

## 2020-08-05 ENCOUNTER — Ambulatory Visit: Payer: Medicare Other | Admitting: Cardiology

## 2020-09-26 ENCOUNTER — Encounter: Payer: Self-pay | Admitting: Neurology

## 2020-10-06 ENCOUNTER — Ambulatory Visit (INDEPENDENT_AMBULATORY_CARE_PROVIDER_SITE_OTHER): Payer: Medicare Other | Admitting: Neurology

## 2020-10-06 ENCOUNTER — Encounter: Payer: Self-pay | Admitting: Neurology

## 2020-10-06 VITALS — BP 121/65 | HR 61 | Ht 71.0 in | Wt 186.3 lb

## 2020-10-06 DIAGNOSIS — R441 Visual hallucinations: Secondary | ICD-10-CM

## 2020-10-06 DIAGNOSIS — R5381 Other malaise: Secondary | ICD-10-CM

## 2020-10-06 DIAGNOSIS — K5909 Other constipation: Secondary | ICD-10-CM | POA: Diagnosis not present

## 2020-10-06 DIAGNOSIS — G20 Parkinson's disease: Secondary | ICD-10-CM | POA: Diagnosis not present

## 2020-10-06 DIAGNOSIS — R410 Disorientation, unspecified: Secondary | ICD-10-CM

## 2020-10-06 DIAGNOSIS — F0280 Dementia in other diseases classified elsewhere without behavioral disturbance: Secondary | ICD-10-CM

## 2020-10-06 MED ORDER — CARBIDOPA-LEVODOPA ER 50-200 MG PO TBCR: 1.0000 | EXTENDED_RELEASE_TABLET | Freq: Every day | ORAL | 3 refills | Status: DC

## 2020-10-06 MED ORDER — MEMANTINE HCL 10 MG PO TABS: 5.0000 mg | ORAL_TABLET | Freq: Two times a day (BID) | ORAL | 5 refills | Status: DC

## 2020-10-06 MED ORDER — CARBIDOPA-LEVODOPA 25-100 MG PO TBDP: 1.0000 | ORAL_TABLET | Freq: Two times a day (BID) | ORAL | 3 refills | Status: DC | PRN

## 2020-10-06 NOTE — Patient Instructions (Addendum)
It was good to see you both again today and to talk to [facility] on the phone.  As discussed, we will start you on Parcopa which is a under the tongue meltable levodopa tablet.  You can use this twice daily as needed, for example when you have sudden onset of difficulty moving or are in between doses and need additional help with your mobility.  We will start you for your memory on Namenda (generic name: Memantine), starting at 5 mg, which is half a pill.  Please take half a pill once daily in the evening for the first 2 weeks and then half a pill twice daily thereafter, eventually, we will try to increase this if tolerated to 10 mg twice daily.   Please note that side effects may include, but are not limited to: nausea, confusion, hallucination, personality changes. If you are having mild side effects, try to stick with the treatment as these initial side effects may go away after the first 10-14 days.     We will initiate physical therapy through home health.
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
## 2020-10-21 ENCOUNTER — Other Ambulatory Visit: Payer: Self-pay

## 2020-10-21 ENCOUNTER — Ambulatory Visit: Payer: Medicare Other

## 2020-10-21 DIAGNOSIS — I714 Abdominal aortic aneurysm, without rupture, unspecified: Secondary | ICD-10-CM

## 2020-10-21 DIAGNOSIS — I6523 Occlusion and stenosis of bilateral carotid arteries: Secondary | ICD-10-CM

## 2020-10-22 ENCOUNTER — Other Ambulatory Visit: Payer: Self-pay | Admitting: Urology

## 2020-10-30 NOTE — Progress Notes (Signed)
Carotid artery duplex 10/21/2020: Stenosis in the right internal carotid artery (16-49%). Stenosis in the left internal carotid artery (16-49%). Stenosis in the left external carotid artery (<50%). Antegrade right vertebral artery flow. Antegrade left vertebral artery flow. Compared to 04/28/2020, left ICA stenosis was >50%. Follow up in one year is appropriate if clinically indicated. No significant change from 08/11/2019.

## 2020-10-30 NOTE — Progress Notes (Signed)
Abdominal Aortic Duplex 10/21/2020: Severe dilatation of the abdominal aorta is noted in the proximal aorta. Moderate dilatation of the abdominal aorta is noted in the mid and distal aorta. An abdominal aortic aneurysm measuring 4.47 x 4.54 x 5 cm is seen. Mild plaque noted in the mid aorta.   Compared to 04/28/2020, there is mild progression in the proximal AAA dimension. Recheck in 6 months for stability if clinically indicated.

## 2020-10-31 ENCOUNTER — Telehealth: Payer: Self-pay

## 2020-10-31 NOTE — Telephone Encounter (Signed)
Done

## 2020-11-10 ENCOUNTER — Telehealth: Payer: Self-pay | Admitting: Neurology

## 2020-11-10 DIAGNOSIS — G20 Parkinson's disease: Secondary | ICD-10-CM

## 2020-11-10 DIAGNOSIS — R410 Disorientation, unspecified: Secondary | ICD-10-CM

## 2020-11-10 DIAGNOSIS — R443 Hallucinations, unspecified: Secondary | ICD-10-CM

## 2020-11-10 DIAGNOSIS — R413 Other amnesia: Secondary | ICD-10-CM

## 2020-11-10 DIAGNOSIS — R5381 Other malaise: Secondary | ICD-10-CM

## 2020-11-10 NOTE — Addendum Note (Signed)
Addended by: [name] on: 11/10/2020 01:29 PM   Modules accepted: Orders

## 2020-11-10 NOTE — Telephone Encounter (Signed)
Referral entered for home health.

## 2020-11-10 NOTE — Telephone Encounter (Signed)
Spoke with Meg @ [facility] regarding patient's referral. They will be able to take patient on. Meg states she will call patient to discuss. Phone: [phone] 

## 2020-11-16 NOTE — Telephone Encounter (Signed)
Received this message from [facility] with [facility]:   We have not been able to reach Mr. Hitsman or his wife to set up home health.  I've left messages with his daughter, too.   I called patient and spoke with his wife, [name]. She states they do not wish to pursue home health at this time. She did mention that she has stopped giving patient Namenda due to diarrhea. She would like a call from RN or MD for some guidance regarding this medication.  Advised Meg w/ [facility] that if patient reaches out to [place] about setting this up we will revisit it.

## 2020-11-16 NOTE — Telephone Encounter (Signed)
I called the pt's wife and advised of recommendation. She verbalized understanding and will keep f/u in August as scheduled.

## 2020-11-16 NOTE — Telephone Encounter (Signed)
Please advise wife that for now he can stay off the Namenda due to diarrhea.  We may be able to consider a different medication when he is more stable with the diarrhea.

## 2020-12-15 NOTE — Telephone Encounter (Signed)
From pt

## 2021-01-10 ENCOUNTER — Ambulatory Visit (INDEPENDENT_AMBULATORY_CARE_PROVIDER_SITE_OTHER): Payer: Medicare Other | Admitting: Neurology

## 2021-01-10 ENCOUNTER — Encounter: Payer: Self-pay | Admitting: Neurology

## 2021-01-10 ENCOUNTER — Other Ambulatory Visit: Payer: Self-pay

## 2021-01-10 VITALS — BP 107/55 | HR 47 | Ht 71.0 in | Wt 167.6 lb

## 2021-01-10 DIAGNOSIS — R197 Diarrhea, unspecified: Secondary | ICD-10-CM

## 2021-01-10 DIAGNOSIS — R5381 Other malaise: Secondary | ICD-10-CM | POA: Diagnosis not present

## 2021-01-10 DIAGNOSIS — G20 Parkinson's disease: Secondary | ICD-10-CM

## 2021-01-10 DIAGNOSIS — R413 Other amnesia: Secondary | ICD-10-CM

## 2021-01-10 DIAGNOSIS — G20A1 Parkinson's disease without dyskinesia, without mention of fluctuations: Secondary | ICD-10-CM

## 2021-01-10 NOTE — Progress Notes (Deleted)
Subjective:    Patient ID: Gregory [name] is a 80 y.o. male.  HPI {Common ambulatory SmartLinks:[id number]}  Review of Systems  Objective:  Neurological Exam  Physical Exam  Assessment:   ***  Plan:   ***
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
## 2021-01-10 NOTE — Patient Instructions (Signed)
It was nice to see you both again today.  I am sorry to hear, that you have had ongoing issues with loose stool/diarrhea and your INR fluctuating.   I agree with your GI specialist that you probably not a good candidate for an endoscopy.  Perhaps a CT scan could be discussed?  I am not sure if your GI specialist could glean more information from a scan but it may be worthwhile making a follow-up appointment even a virtual visit.  I would not recommend starting you on any new medication at this time since you are still struggling with diarrhea.  We will arrange for a follow-up in about 4 months with a MyChart video visit.

## 2021-01-18 ENCOUNTER — Other Ambulatory Visit: Payer: Self-pay | Admitting: Urology

## 2021-01-30 ENCOUNTER — Ambulatory Visit: Payer: Medicare Other | Admitting: Cardiology

## 2021-01-30 ENCOUNTER — Other Ambulatory Visit: Payer: Self-pay

## 2021-01-30 ENCOUNTER — Encounter: Payer: Self-pay | Admitting: Cardiology

## 2021-01-30 VITALS — BP 82/50 | HR 71 | Temp 97.8°F | Resp 16 | Ht 71.0 in | Wt 167.0 lb

## 2021-01-30 DIAGNOSIS — F0280 Dementia in other diseases classified elsewhere without behavioral disturbance: Secondary | ICD-10-CM

## 2021-01-30 DIAGNOSIS — I6523 Occlusion and stenosis of bilateral carotid arteries: Secondary | ICD-10-CM

## 2021-01-30 DIAGNOSIS — I480 Paroxysmal atrial fibrillation: Secondary | ICD-10-CM

## 2021-01-30 DIAGNOSIS — I714 Abdominal aortic aneurysm, without rupture, unspecified: Secondary | ICD-10-CM

## 2021-01-30 DIAGNOSIS — I25810 Atherosclerosis of coronary artery bypass graft(s) without angina pectoris: Secondary | ICD-10-CM

## 2021-01-30 DIAGNOSIS — Z66 Do not resuscitate: Secondary | ICD-10-CM

## 2021-01-30 MED ORDER — APIXABAN 5 MG PO TABS: 5.0000 mg | ORAL_TABLET | Freq: Two times a day (BID) | ORAL | 3 refills | Status: DC
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
## 2021-03-10 ENCOUNTER — Telehealth: Payer: Self-pay | Admitting: Neurology

## 2021-03-10 NOTE — Telephone Encounter (Signed)
Pt's wife, Bryston Colocho request refill for carbidopa-levodopa (SINEMET CR) 50-200 MG tablet at [id number]

## 2021-03-13 MED ORDER — CARBIDOPA-LEVODOPA ER 50-200 MG PO TBCR
1.0000 | EXTENDED_RELEASE_TABLET | Freq: Every day | ORAL | 1 refills | Status: DC
Start: 2021-03-13 — End: 2021-08-01

## 2021-03-13 NOTE — Telephone Encounter (Signed)
Pt's wife returned the call. She would like to switch Carbidopa-Levodopa 50-200 mg refills back to [facility]. Had trouble during her conversation with [facility] attempting to request a refill and no longer wants to use them. She also added that patient has continued to decline with his mobility, takes a lot of effort to transfer from the recliner. States his joints are affected. He has continued to deal with the GI issues. She states he eats good but he has lost 20 lbs since he saw Dr Frances Furbish. Pt has a pending f/u 05/09/21.

## 2021-03-13 NOTE — Telephone Encounter (Signed)
I called pts wife. LMVM for her that questioning how [facility], [facility], [facility] a [facility] prescription.

## 2021-03-13 NOTE — Telephone Encounter (Signed)
Will call pts wife and ask about refill.  Last prescription [facility] for a year.

## 2021-03-15 NOTE — Telephone Encounter (Signed)
We can keep appt for now. I recommend FU with PCP for joint pain and weight loss.

## 2021-03-15 NOTE — Telephone Encounter (Signed)
LMVM for wife of pt.  Ok per Dr. Frances Furbish to keep the appt as is already scheduled 05-09-21.  Would have him see pcp for evaluation on joint pain and weight loss.  I left # for her to call back if questions.

## 2021-04-22 ENCOUNTER — Other Ambulatory Visit: Payer: Self-pay | Admitting: Urology

## 2021-05-01 ENCOUNTER — Telehealth: Payer: Self-pay | Admitting: Neurology

## 2021-05-01 NOTE — Telephone Encounter (Signed)
Unable to LVM to r/s 12/20 appt- mychart msg sent (Dr. Frances Furbish out).

## 2021-05-02 ENCOUNTER — Other Ambulatory Visit: Payer: Medicare Other

## 2021-05-09 ENCOUNTER — Telehealth: Payer: Medicare Other | Admitting: Neurology

## 2021-05-10 ENCOUNTER — Ambulatory Visit: Payer: Medicare Other | Admitting: Cardiology

## 2021-06-21 ENCOUNTER — Encounter: Payer: Self-pay | Admitting: *Deleted

## 2021-06-21 ENCOUNTER — Emergency Department: Payer: Medicare Other

## 2021-06-21 ENCOUNTER — Other Ambulatory Visit: Payer: Self-pay

## 2021-06-21 ENCOUNTER — Inpatient Hospital Stay
Admission: EM | Admit: 2021-06-21 | Discharge: 2021-06-27 | DRG: 480 | Disposition: A | Discharge status: Skilled Nursing Facility | Payer: Medicare Other | Attending: Internal Medicine | Admitting: Internal Medicine

## 2021-06-21 DIAGNOSIS — E785 Hyperlipidemia, unspecified: Secondary | ICD-10-CM | POA: Diagnosis present

## 2021-06-21 DIAGNOSIS — S72142A Displaced intertrochanteric fracture of left femur, initial encounter for closed fracture: Secondary | ICD-10-CM | POA: Diagnosis present

## 2021-06-21 DIAGNOSIS — I2510 Atherosclerotic heart disease of native coronary artery without angina pectoris: Secondary | ICD-10-CM | POA: Diagnosis present

## 2021-06-21 DIAGNOSIS — J988 Other specified respiratory disorders: Secondary | ICD-10-CM | POA: Diagnosis not present

## 2021-06-21 DIAGNOSIS — L89302 Pressure ulcer of unspecified buttock, stage 2: Secondary | ICD-10-CM | POA: Diagnosis present

## 2021-06-21 DIAGNOSIS — D62 Acute posthemorrhagic anemia: Secondary | ICD-10-CM | POA: Diagnosis not present

## 2021-06-21 DIAGNOSIS — E039 Hypothyroidism, unspecified: Secondary | ICD-10-CM | POA: Diagnosis present

## 2021-06-21 DIAGNOSIS — I714 Abdominal aortic aneurysm, without rupture, unspecified: Secondary | ICD-10-CM | POA: Diagnosis present

## 2021-06-21 DIAGNOSIS — L89152 Pressure ulcer of sacral region, stage 2: Secondary | ICD-10-CM | POA: Diagnosis not present

## 2021-06-21 DIAGNOSIS — Z79890 Hormone replacement therapy (postmenopausal): Secondary | ICD-10-CM

## 2021-06-21 DIAGNOSIS — S72002A Fracture of unspecified part of neck of left femur, initial encounter for closed fracture: Secondary | ICD-10-CM | POA: Diagnosis present

## 2021-06-21 DIAGNOSIS — G20 Parkinson's disease: Secondary | ICD-10-CM | POA: Diagnosis present

## 2021-06-21 DIAGNOSIS — I4891 Unspecified atrial fibrillation: Secondary | ICD-10-CM | POA: Diagnosis present

## 2021-06-21 DIAGNOSIS — K219 Gastro-esophageal reflux disease without esophagitis: Secondary | ICD-10-CM | POA: Diagnosis present

## 2021-06-21 DIAGNOSIS — L89151 Pressure ulcer of sacral region, stage 1: Secondary | ICD-10-CM | POA: Diagnosis present

## 2021-06-21 DIAGNOSIS — F0280 Dementia in other diseases classified elsewhere without behavioral disturbance: Secondary | ICD-10-CM | POA: Diagnosis present

## 2021-06-21 DIAGNOSIS — G9341 Metabolic encephalopathy: Secondary | ICD-10-CM | POA: Diagnosis not present

## 2021-06-21 DIAGNOSIS — W19XXXA Unspecified fall, initial encounter: Secondary | ICD-10-CM

## 2021-06-21 DIAGNOSIS — Y92009 Unspecified place in unspecified non-institutional (private) residence as the place of occurrence of the external cause: Secondary | ICD-10-CM | POA: Diagnosis not present

## 2021-06-21 DIAGNOSIS — Z8249 Family history of ischemic heart disease and other diseases of the circulatory system: Secondary | ICD-10-CM

## 2021-06-21 DIAGNOSIS — Z20822 Contact with and (suspected) exposure to covid-19: Secondary | ICD-10-CM | POA: Diagnosis present

## 2021-06-21 DIAGNOSIS — I7143 Infrarenal abdominal aortic aneurysm, without rupture: Secondary | ICD-10-CM | POA: Diagnosis present

## 2021-06-21 DIAGNOSIS — W010XXA Fall on same level from slipping, tripping and stumbling without subsequent striking against object, initial encounter: Secondary | ICD-10-CM | POA: Diagnosis present

## 2021-06-21 DIAGNOSIS — S72002D Fracture of unspecified part of neck of left femur, subsequent encounter for closed fracture with routine healing: Secondary | ICD-10-CM | POA: Diagnosis not present

## 2021-06-21 DIAGNOSIS — K760 Fatty (change of) liver, not elsewhere classified: Secondary | ICD-10-CM | POA: Diagnosis present

## 2021-06-21 DIAGNOSIS — Z87891 Personal history of nicotine dependence: Secondary | ICD-10-CM | POA: Diagnosis not present

## 2021-06-21 DIAGNOSIS — R140 Abdominal distension (gaseous): Secondary | ICD-10-CM

## 2021-06-21 DIAGNOSIS — Z79899 Other long term (current) drug therapy: Secondary | ICD-10-CM | POA: Diagnosis not present

## 2021-06-21 DIAGNOSIS — I25810 Atherosclerosis of coronary artery bypass graft(s) without angina pectoris: Secondary | ICD-10-CM | POA: Diagnosis present

## 2021-06-21 DIAGNOSIS — R410 Disorientation, unspecified: Secondary | ICD-10-CM

## 2021-06-21 DIAGNOSIS — Z7901 Long term (current) use of anticoagulants: Secondary | ICD-10-CM

## 2021-06-21 DIAGNOSIS — I4821 Permanent atrial fibrillation: Secondary | ICD-10-CM | POA: Diagnosis not present

## 2021-06-21 DIAGNOSIS — Z419 Encounter for procedure for purposes other than remedying health state, unspecified: Secondary | ICD-10-CM

## 2021-06-21 DIAGNOSIS — I480 Paroxysmal atrial fibrillation: Secondary | ICD-10-CM | POA: Diagnosis not present

## 2021-06-21 DIAGNOSIS — L8990 Pressure ulcer of unspecified site, unspecified stage: Secondary | ICD-10-CM | POA: Diagnosis present

## 2021-06-21 LAB — COMPREHENSIVE METABOLIC PANEL
ALT: <5 U/L (ref 0–44)
AST: 21 U/L (ref 15–41)
Albumin: 3.6 g/dL (ref 3.5–5.0)
Alkaline Phosphatase: 78 U/L (ref 38–126)
Anion gap: 9 (ref 5–15)
BUN: 17 mg/dL (ref 8–23)
CO2: 27 mmol/L (ref 22–32)
Calcium: 9 mg/dL (ref 8.9–10.3)
Chloride: 100 mmol/L (ref 98–111)
Creatinine, Ser: 0.89 mg/dL (ref 0.61–1.24)
GFR, Estimated: >60 mL/min (ref >60)
Glucose, Bld: 129 mg/dL — ABNORMAL HIGH (ref 70–99)
Potassium: 4.4 mmol/L (ref 3.5–5.1)
Sodium: 136 mmol/L (ref 135–145)
Total Bilirubin: 1.5 mg/dL — ABNORMAL HIGH (ref 0.3–1.2)
Total Protein: 7.1 g/dL (ref 6.5–8.1)

## 2021-06-21 LAB — CBC WITH DIFFERENTIAL/PLATELET
Abs Immature Granulocytes: 0.04 10*3/uL (ref 0.00–0.07)
Abs Immature Granulocytes: 0.04 10*3/uL (ref 0.00–0.07)
Basophils Absolute: 0 10*3/uL (ref 0.0–0.1)
Basophils Absolute: 0 10*3/uL (ref 0.0–0.1)
Basophils Relative: 0 %
Basophils Relative: 0 %
Eosinophils Absolute: 0 10*3/uL (ref 0.0–0.5)
Eosinophils Absolute: 0 10*3/uL (ref 0.0–0.5)
Eosinophils Relative: 0 %
Eosinophils Relative: 0 %
HCT: 36.6 % — ABNORMAL LOW (ref 39.0–52.0)
HCT: 36.2 % — ABNORMAL LOW (ref 39.0–52.0)
Hemoglobin: 11.5 g/dL — ABNORMAL LOW (ref 13.0–17.0)
Hemoglobin: 11.5 g/dL — ABNORMAL LOW (ref 13.0–17.0)
Immature Granulocytes: 0 %
Immature Granulocytes: 0 %
Lymphocytes Relative: 3 %
Lymphocytes Relative: 4 %
Lymphs Abs: 0.3 10*3/uL — ABNORMAL LOW (ref 0.7–4.0)
Lymphs Abs: 0.4 10*3/uL — ABNORMAL LOW (ref 0.7–4.0)
MCH: 29.4 pg (ref 26.0–34.0)
MCH: 29.6 pg (ref 26.0–34.0)
MCHC: 31.4 g/dL (ref 30.0–36.0)
MCHC: 31.8 g/dL (ref 30.0–36.0)
MCV: 93.6 fL (ref 80.0–100.0)
MCV: 93.1 fL (ref 80.0–100.0)
Monocytes Absolute: 0.6 10*3/uL (ref 0.1–1.0)
Monocytes Absolute: 0.6 10*3/uL (ref 0.1–1.0)
Monocytes Relative: 6 %
Monocytes Relative: 6 %
Neutro Abs: 8.7 10*3/uL — ABNORMAL HIGH (ref 1.7–7.7)
Neutro Abs: 8.2 10*3/uL — ABNORMAL HIGH (ref 1.7–7.7)
Neutrophils Relative %: 91 %
Neutrophils Relative %: 90 %
Platelets: 253 10*3/uL (ref 150–400)
Platelets: 252 10*3/uL (ref 150–400)
RBC: 3.91 MIL/uL — ABNORMAL LOW (ref 4.22–5.81)
RBC: 3.89 MIL/uL — ABNORMAL LOW (ref 4.22–5.81)
RDW: 14.5 % (ref 11.5–15.5)
RDW: 14.4 % (ref 11.5–15.5)
WBC: 9.7 10*3/uL (ref 4.0–10.5)
WBC: 9.2 10*3/uL (ref 4.0–10.5)
nRBC: 0 % (ref 0.0–0.2)
nRBC: 0 % (ref 0.0–0.2)

## 2021-06-21 LAB — RESP PANEL BY RT-PCR (FLU A&B, COVID) ARPGX2
Influenza A by PCR: NEGATIVE
Influenza B by PCR: NEGATIVE
SARS Coronavirus 2 by RT PCR: NEGATIVE

## 2021-06-21 LAB — BASIC METABOLIC PANEL
Anion gap: 6 (ref 5–15)
BUN: 15 mg/dL (ref 8–23)
CO2: 28 mmol/L (ref 22–32)
Calcium: 9.2 mg/dL (ref 8.9–10.3)
Chloride: 102 mmol/L (ref 98–111)
Creatinine, Ser: 0.85 mg/dL (ref 0.61–1.24)
GFR, Estimated: >60 mL/min (ref >60)
Glucose, Bld: 123 mg/dL — ABNORMAL HIGH (ref 70–99)
Potassium: 4.6 mmol/L (ref 3.5–5.1)
Sodium: 136 mmol/L (ref 135–145)

## 2021-06-21 LAB — TYPE AND SCREEN
ABO/RH(D): O POS
Antibody Screen: NEGATIVE

## 2021-06-21 MED ORDER — POLYETHYLENE GLYCOL 3350 17 G PO PACK: 17.0000 g | PACK | Freq: Every day | ORAL | Status: DC | PRN

## 2021-06-21 MED ORDER — ACETAMINOPHEN 500 MG PO TABS
1000.0000 mg | ORAL_TABLET | Freq: Once | ORAL | Status: AC
  Administered 2021-06-21: 1000 mg via ORAL
  Filled 2021-06-21: qty 2

## 2021-06-21 MED ORDER — METHOCARBAMOL 1000 MG/10ML IJ SOLN
500.0000 mg | Freq: Four times a day (QID) | INTRAVENOUS | Status: DC | PRN
  Filled 2021-06-21: qty 5

## 2021-06-21 MED ORDER — DOCUSATE SODIUM 100 MG PO CAPS
100.0000 mg | ORAL_CAPSULE | Freq: Two times a day (BID) | ORAL | Status: DC
  Administered 2021-06-21 – 2021-06-27 (×9): 100 mg via ORAL
  Filled 2021-06-21 (×11): qty 1

## 2021-06-21 MED ORDER — ENOXAPARIN SODIUM 30 MG/0.3ML IJ SOSY
30.0000 mg | PREFILLED_SYRINGE | Freq: Two times a day (BID) | INTRAMUSCULAR | Status: AC
  Administered 2021-06-21 – 2021-06-22 (×3): 30 mg via SUBCUTANEOUS
  Filled 2021-06-21 (×3): qty 0.3

## 2021-06-21 MED ORDER — METHOCARBAMOL 500 MG PO TABS
500.0000 mg | ORAL_TABLET | Freq: Four times a day (QID) | ORAL | Status: DC | PRN
  Filled 2021-06-21: qty 1

## 2021-06-21 MED ORDER — ONDANSETRON HCL 4 MG/2ML IJ SOLN
4.0000 mg | Freq: Once | INTRAMUSCULAR | Status: AC
  Administered 2021-06-21: 4 mg via INTRAVENOUS
  Filled 2021-06-21: qty 2

## 2021-06-21 MED ORDER — ATORVASTATIN CALCIUM 20 MG PO TABS
20.0000 mg | ORAL_TABLET | ORAL | Status: DC
  Administered 2021-06-26: 20 mg via ORAL
  Filled 2021-06-21: qty 1

## 2021-06-21 MED ORDER — BISACODYL 5 MG PO TBEC: 5.0000 mg | DELAYED_RELEASE_TABLET | Freq: Every day | ORAL | Status: DC | PRN

## 2021-06-21 MED ORDER — MORPHINE SULFATE (PF) 2 MG/ML IV SOLN
2.0000 mg | Freq: Once | INTRAVENOUS | Status: AC
  Administered 2021-06-21: 2 mg via INTRAVENOUS
  Filled 2021-06-21: qty 1

## 2021-06-21 MED ORDER — PANTOPRAZOLE SODIUM 40 MG IV SOLR
40.0000 mg | INTRAVENOUS | Status: DC
  Administered 2021-06-21 – 2021-06-26 (×6): 40 mg via INTRAVENOUS
  Filled 2021-06-21 (×6): qty 40

## 2021-06-21 MED ORDER — CARBIDOPA-LEVODOPA ER 50-200 MG PO TBCR
1.0000 | EXTENDED_RELEASE_TABLET | Freq: Every day | ORAL | Status: DC
  Administered 2021-06-21 – 2021-06-27 (×24): 1 via ORAL
  Filled 2021-06-21 (×33): qty 1

## 2021-06-21 MED ORDER — SODIUM CHLORIDE 0.9 % IV SOLN: INTRAVENOUS | Status: DC

## 2021-06-21 MED ORDER — LEVOTHYROXINE SODIUM 50 MCG PO TABS
50.0000 ug | ORAL_TABLET | Freq: Every day | ORAL | Status: DC
  Administered 2021-06-22 – 2021-06-27 (×4): 50 ug via ORAL
  Filled 2021-06-21 (×6): qty 1

## 2021-06-21 MED ORDER — TAMSULOSIN HCL 0.4 MG PO CAPS
0.4000 mg | ORAL_CAPSULE | Freq: Every day | ORAL | Status: DC
  Administered 2021-06-21 – 2021-06-25 (×4): 0.4 mg via ORAL
  Filled 2021-06-21 (×4): qty 1

## 2021-06-21 MED ORDER — MORPHINE SULFATE (PF) 2 MG/ML IV SOLN
2.0000 mg | INTRAVENOUS | Status: DC | PRN
  Administered 2021-06-24 (×3): 2 mg via INTRAVENOUS
  Filled 2021-06-21 (×3): qty 1

## 2021-06-21 MED ORDER — HYDROCODONE-ACETAMINOPHEN 5-325 MG PO TABS
1.0000 | ORAL_TABLET | Freq: Four times a day (QID) | ORAL | Status: DC | PRN
  Administered 2021-06-21 – 2021-06-22 (×2): 1 via ORAL
  Administered 2021-06-24 (×2): 2 via ORAL
  Filled 2021-06-21 (×3): qty 1
  Filled 2021-06-21 (×2): qty 2

## 2021-06-21 NOTE — Assessment & Plan Note (Deleted)
cghj,kcdgy

## 2021-06-21 NOTE — ED Triage Notes (Addendum)
BIB [facility] from home s/p witnessed fall at 1200. Tipped over to the left while using the walker. C/o L thigh and ankle pain. Shortening and rotation present. Possible deformity. Bruising to ankle. CMS intact. TTP. No LOC, no head injury, no blood thinners. Alert, NAD, calm, interactive. Family following.

## 2021-06-21 NOTE — Assessment & Plan Note (Deleted)
PPI therapy. Aspiration precaution.

## 2021-06-21 NOTE — Plan of Care (Signed)

## 2021-06-21 NOTE — Assessment & Plan Note (Deleted)
Goal is BP optimization. Pt no longer smokes since 40  Years ago.  Infrarenal abdominal aortic aneurysm measuring 4.9 cm. This ends at the aortic bifurcation- 2021.

## 2021-06-21 NOTE — Assessment & Plan Note (Addendum)
Continue levothyroxine 

## 2021-06-21 NOTE — ED Provider Notes (Signed)
[facility] Provider Note    Event Date/Time   First MD Initiated Contact with Patient 06/21/21 1721     (approximate)   History   Fall and Leg Injury   HPI  Gregory [name] is a 81 y.o. male here with fall.  Patient states that he was using his walker today in his house when he turned, lost his footing, causing him to fall.  He landed directly onto his left hip.  He reports aching, throbbing pain of the left hip, though it is mostly just with movement.  He thinks he may have hit his head but denies loss of consciousness.  He states he takes blood thinners.  He is not sure why.  Denies any numbness or weakness.  Denies any back pain.  Denies any recent illnesses.     Physical Exam   Triage Vital Signs: ED Triage Vitals  Enc Vitals Group     BP 06/21/21 1722 133/69     Pulse Rate 06/21/21 1722 63     Resp 06/21/21 1722 17     Temp 06/21/21 1722 (!) 97.4 F (36.3 C)     Temp Source 06/21/21 1722 Oral     SpO2 06/21/21 1714 94 %     Weight 06/21/21 1718 170 lb (77.1 kg)     Height --      Head Circumference --      Peak Flow --      Pain Score 06/21/21 1717 0     Pain Loc --      Pain Edu? --      Excl. in GC? --     Most recent vital signs: Vitals:   06/21/21 2025 06/21/21 2105  BP: 130/67 (!) 109/50  Pulse: 68 61  Resp: 20 16  Temp:  97.9 F (36.6 C)  SpO2: 98% 100%     General: Awake, no distress.  CV:  Good peripheral perfusion.  Mild bradycardia.  No murmurs. Resp:  Normal effort.  Normal work of breathing.  No rales or rhonchi. Abd:  No distention.  No tenderness Other:  Left lower extremity shortened with external rotation and marked tenderness with any passive range of motion at the hip.  No open wounds.  No bruising at the hip.  No cervical, thoracic, or lumbar spine tenderness.  No bony tenderness throughout palpation of the chest or abdomen.   ED Results / Procedures / Treatments   Labs (all labs ordered are listed, but  only abnormal results are displayed) Labs Reviewed  CBC WITH DIFFERENTIAL/PLATELET - Abnormal; Notable for the following components:      Result Value   RBC 3.91 (*)    Hemoglobin 11.5 (*)    HCT 36.6 (*)    Neutro Abs 8.7 (*)    Lymphs Abs 0.3 (*)    All other components within normal limits  BASIC METABOLIC PANEL - Abnormal; Notable for the following components:   Glucose, Bld 123 (*)    All other components within normal limits  COMPREHENSIVE METABOLIC PANEL - Abnormal; Notable for the following components:   Glucose, Bld 129 (*)    Total Bilirubin 1.5 (*)    All other components within normal limits  CBC WITH DIFFERENTIAL/PLATELET - Abnormal; Notable for the following components:   RBC 3.89 (*)    Hemoglobin 11.5 (*)    HCT 36.2 (*)    Neutro Abs 8.2 (*)    Lymphs Abs 0.4 (*)    All other  components within normal limits  RESP PANEL BY RT-PCR (FLU A&B, COVID) ARPGX2  CBC  TYPE AND SCREEN      RADIOLOGY CT head: No acute intracranial abnormality CT C-spine: No acute fracture or malalignment X-ray hip left: Comminuted intertrochanteric fracture Chest x-ray: Mild cardiomegaly, otherwise no acute abnormality.  No pneumonia.   I also independently reviewed and agree wit radiologist interpretations.   PROCEDURES:  Critical Care performed: No  .1-3 Lead EKG Interpretation Performed by: Shaune PollackIsaacs, Jaquay Morneault, MD Authorized by: Shaune PollackIsaacs, Vance Hochmuth, MD     Interpretation: abnormal     ECG rate:  50-70   ECG rate assessment: normal     Rhythm: sinus bradycardia     Ectopy: none     Conduction: normal   Comments:     Indication: Weakness, fall    MEDICATIONS ORDERED IN ED: Medications  atorvastatin (LIPITOR) tablet 20 mg (has no administration in time range)  levothyroxine (SYNTHROID) tablet 50 mcg (has no administration in time range)  tamsulosin (FLOMAX) capsule 0.4 mg (0.4 mg Oral Given 06/21/21 2324)  carbidopa-levodopa (SINEMET CR) 50-200 MG per tablet controlled  release 1 tablet (1 tablet Oral Given 06/21/21 2326)  HYDROcodone-acetaminophen (NORCO/VICODIN) 5-325 MG per tablet 1-2 tablet (1 tablet Oral Given 06/21/21 2033)  morphine (PF) 2 MG/ML injection 2 mg (has no administration in time range)  methocarbamol (ROBAXIN) tablet 500 mg (has no administration in time range)    Or  methocarbamol (ROBAXIN) 500 mg in dextrose 5 % 50 mL IVPB (has no administration in time range)  docusate sodium (COLACE) capsule 100 mg (100 mg Oral Given 06/21/21 2324)  polyethylene glycol (MIRALAX / GLYCOLAX) packet 17 g (has no administration in time range)  bisacodyl (DULCOLAX) EC tablet 5 mg (has no administration in time range)  0.9 %  sodium chloride infusion ( Intravenous New Bag/Given 06/21/21 2150)  enoxaparin (LOVENOX) injection 30 mg (30 mg Subcutaneous Given 06/21/21 2324)  pantoprazole (PROTONIX) injection 40 mg (40 mg Intravenous Given 06/21/21 2324)  morphine (PF) 2 MG/ML injection 2 mg (2 mg Intravenous Given 06/21/21 1753)  ondansetron (ZOFRAN) injection 4 mg (4 mg Intravenous Given 06/21/21 1753)  acetaminophen (TYLENOL) tablet 1,000 mg (1,000 mg Oral Given 06/21/21 1752)     IMPRESSION / MDM / ASSESSMENT AND PLAN / ED COURSE  I reviewed the triage vital signs and the nursing notes.                               The patient is on the cardiac monitor to evaluate for evidence of arrhythmia and/or significant heart rate changes.   MDM:  81 yo M with PMHx CAD s/p CABG, dementia, AFib on anticoagulation here with fall and L hip pain. Imaging obtained, reviewed, and shows comminuted IT fx of L hip. Discussed with Dr. Rosita KeaMenz, pt is anticoagulated so will need to hold this. Will admit to medicine. Screening lab work largely reassuring. Hgb 11.5, mild anemia noted. No leukocytosis. Renal function normal. COVID negative. CT Head/C Spine reviewed and show no signs of traumatic injury. No other trauma noted on secondary exam.  Reviewed prior ED visits, PCP notes and Cardiologist  notes. Pt does have significant cardiac history per Dr. Verl DickerGanji's outpt notes. Will admit to medicine for medical stabilization, with ortho consultation. Pt updated, as well daughter at bedside.   MEDICATIONS GIVEN IN ED: Medications  atorvastatin (LIPITOR) tablet 20 mg (has no administration in time range)  levothyroxine (SYNTHROID)  tablet 50 mcg (has no administration in time range)  tamsulosin (FLOMAX) capsule 0.4 mg (0.4 mg Oral Given 06/21/21 2324)  carbidopa-levodopa (SINEMET CR) 50-200 MG per tablet controlled release 1 tablet (1 tablet Oral Given 06/21/21 2326)  HYDROcodone-acetaminophen (NORCO/VICODIN) 5-325 MG per tablet 1-2 tablet (1 tablet Oral Given 06/21/21 2033)  morphine (PF) 2 MG/ML injection 2 mg (has no administration in time range)  methocarbamol (ROBAXIN) tablet 500 mg (has no administration in time range)    Or  methocarbamol (ROBAXIN) 500 mg in dextrose 5 % 50 mL IVPB (has no administration in time range)  docusate sodium (COLACE) capsule 100 mg (100 mg Oral Given 06/21/21 2324)  polyethylene glycol (MIRALAX / GLYCOLAX) packet 17 g (has no administration in time range)  bisacodyl (DULCOLAX) EC tablet 5 mg (has no administration in time range)  0.9 %  sodium chloride infusion ( Intravenous New Bag/Given 06/21/21 2150)  enoxaparin (LOVENOX) injection 30 mg (30 mg Subcutaneous Given 06/21/21 2324)  pantoprazole (PROTONIX) injection 40 mg (40 mg Intravenous Given 06/21/21 2324)  morphine (PF) 2 MG/ML injection 2 mg (2 mg Intravenous Given 06/21/21 1753)  ondansetron (ZOFRAN) injection 4 mg (4 mg Intravenous Given 06/21/21 1753)  acetaminophen (TYLENOL) tablet 1,000 mg (1,000 mg Oral Given 06/21/21 1752)     Consults:  Dr. Rosita Kea, Orthopedics, recommends admission and holding anticoagulation   EMR reviewed  Former outpt notes, including Dr. Jacinto Halim recent cardiology visit, notes h/o AFib, HTN, dementia      FINAL CLINICAL IMPRESSION(S) / ED DIAGNOSES   Final diagnoses:  Closed  displaced intertrochanteric fracture of left femur, initial encounter (HCC)  Fall, initial encounter     Rx / DC Orders   ED Discharge Orders     None        Note:  This document was prepared using Dragon voice recognition software and may include unintentional dictation errors.   Shaune Pollack, MD 06/22/21 [phone]

## 2021-06-21 NOTE — Assessment & Plan Note (Deleted)
Falls due to  Ambulatory dysfunction due to  parkinsonism.  Fall precaution.
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
## 2021-06-21 NOTE — Assessment & Plan Note (Addendum)
Status post intramedullary nail placement by Dr. Rosita Kea on 06/23/2021.  Pain control.  Continue working with physical therapy.  Physical therapy recommending rehab.  Try to convert to Tylenol as quickly as possible.  Patient to go out to rehab today.

## 2021-06-21 NOTE — Assessment & Plan Note (Deleted)
No contraindications to surgery at this time

## 2021-06-21 NOTE — Assessment & Plan Note (Deleted)
Eliquis on hold and lovenox 30 mg q 12 for 3 doses.  Telemetry monitoring.

## 2021-06-21 NOTE — Progress Notes (Signed)
Will see in AM. Plan surgery Friday afternoon secondary to Eliquis.

## 2021-06-22 ENCOUNTER — Inpatient Hospital Stay: Payer: Medicare Other

## 2021-06-22 DIAGNOSIS — I25810 Atherosclerosis of coronary artery bypass graft(s) without angina pectoris: Secondary | ICD-10-CM

## 2021-06-22 DIAGNOSIS — S72002D Fracture of unspecified part of neck of left femur, subsequent encounter for closed fracture with routine healing: Secondary | ICD-10-CM

## 2021-06-22 DIAGNOSIS — L89152 Pressure ulcer of sacral region, stage 2: Secondary | ICD-10-CM

## 2021-06-22 DIAGNOSIS — E039 Hypothyroidism, unspecified: Secondary | ICD-10-CM

## 2021-06-22 DIAGNOSIS — G20 Parkinson's disease: Secondary | ICD-10-CM

## 2021-06-22 DIAGNOSIS — E785 Hyperlipidemia, unspecified: Secondary | ICD-10-CM

## 2021-06-22 DIAGNOSIS — I480 Paroxysmal atrial fibrillation: Secondary | ICD-10-CM

## 2021-06-22 DIAGNOSIS — L8990 Pressure ulcer of unspecified site, unspecified stage: Secondary | ICD-10-CM | POA: Diagnosis present

## 2021-06-22 LAB — CBC
HCT: 30.2 % — ABNORMAL LOW (ref 39.0–52.0)
Hemoglobin: 9.7 g/dL — ABNORMAL LOW (ref 13.0–17.0)
MCH: 28.9 pg (ref 26.0–34.0)
MCHC: 32.1 g/dL (ref 30.0–36.0)
MCV: 89.9 fL (ref 80.0–100.0)
Platelets: 214 10*3/uL (ref 150–400)
RBC: 3.36 MIL/uL — ABNORMAL LOW (ref 4.22–5.81)
RDW: 14.6 % (ref 11.5–15.5)
WBC: 6.1 10*3/uL (ref 4.0–10.5)
nRBC: 0 % (ref 0.0–0.2)

## 2021-06-22 MED ORDER — FE FUMARATE-B12-VIT C-FA-IFC PO CAPS
1.0000 | ORAL_CAPSULE | Freq: Two times a day (BID) | ORAL | Status: DC
  Administered 2021-06-22 – 2021-06-27 (×8): 1 via ORAL
  Filled 2021-06-22 (×13): qty 1

## 2021-06-22 MED ORDER — CEFAZOLIN SODIUM-DEXTROSE 2-4 GM/100ML-% IV SOLN
2.0000 g | Freq: Once | INTRAVENOUS | Status: AC
  Administered 2021-06-23: 2 g via INTRAVENOUS
  Filled 2021-06-22: qty 100

## 2021-06-22 MED ORDER — SODIUM CHLORIDE 0.9 % IV SOLN: INTRAVENOUS | Status: DC

## 2021-06-22 MED ORDER — BISACODYL 10 MG RE SUPP
10.0000 mg | Freq: Once | RECTAL | Status: AC
  Administered 2021-06-22: 10 mg via RECTAL
  Filled 2021-06-22: qty 1

## 2021-06-22 NOTE — Assessment & Plan Note (Signed)
Continue atorvastatin
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
## 2021-06-22 NOTE — Assessment & Plan Note (Signed)
Continue Sinemet ?

## 2021-06-22 NOTE — Progress Notes (Signed)
°  Progress Note   Patient: Gregory [name] ZRA:[id number] DOB: 08/13/1940 DOA: 06/21/2021     1 DOS: the patient was seen and examined on 06/22/2021   Assessment and Plan: * Closed left hip fracture (HCC)- (present on admission) Dr. Rosita Kea to do surgical procedure tomorrow.  Holding Eliquis today on Lovenox bridge.  No Lovenox tomorrow morning.   Atrial fibrillation (HCC)-resolved as of 06/22/2021, (present on admission) Paroxysmal in nature.  Holding Eliquis for procedure.  We will give Lovenox this morning and this evening and hold it after that.  Coronary artery disease involving coronary bypass graft of native heart without angina pectoris- (present on admission) No contraindications to surgery at this time  Hyperlipidemia- (present on admission) Continue atorvastatin  Parkinson's disease (HCC)- (present on admission) Continue Sinemet  Hypothyroidism- (present on admission) TSH of 5.816 / FT4 of 1.13 Continue levothyroxine  Pressure injury of skin- (present on admission) As per nursing staff stage II decubiti on buttock area.        Subjective: Patient found to have a hip fracture patient does have some pain.  Poor historian.  As per wife the patient does not walk very much since COVID started and the physical therapy ended.  He did well with physical therapy when they were working with him.  Physical Exam: Vitals:   06/21/21 2105 06/22/21 0350 06/22/21 0810 06/22/21 1248  BP: (!) 109/50 (!) 99/57 (!) 104/58 110/65  Pulse: 61 70 63 63  Resp: 16 16 12 16   Temp: 97.9 F (36.6 C) (!) 97.4 F (36.3 C) 98.1 F (36.7 C) 97.9 F (36.6 C)  TempSrc:    Oral  SpO2: 100% 96% 95% 96%  Weight: 77.1 kg      Physical Exam HENT:     Head: Normocephalic.     Mouth/Throat:     Pharynx: No oropharyngeal exudate.  Eyes:     General: Lids are normal.     Conjunctiva/sclera: Conjunctivae normal.  Cardiovascular:     Rate and Rhythm: Normal rate and regular rhythm.     Heart  sounds: Normal heart sounds, S1 normal and S2 normal.  Pulmonary:     Breath sounds: Normal breath sounds. No decreased breath sounds, wheezing, rhonchi or rales.  Abdominal:     Palpations: Abdomen is soft.     Tenderness: There is no abdominal tenderness.  Musculoskeletal:     Right ankle: No swelling.     Left ankle: No swelling.  Skin:    General: Skin is warm.     Comments: As per nursing staff stage II decubiti sacral area  Neurological:     Mental Status: He is alert.     Comments: Answers a few simple questions     Data Reviewed: Laboratory and radiological data reviewed by me  Family Communication: Spoke with patient's wife on the phone  Disposition: Status is: Inpatient Remains inpatient appropriate because: Left hip repair surgery tomorrow   Planned Discharge Destination: Skilled nursing facility   Author: , MD 06/22/2021 4:10 PM  For on call review [email or web address].

## 2021-06-22 NOTE — Assessment & Plan Note (Addendum)
As per nursing staff stage I decubiti on buttock area.

## 2021-06-22 NOTE — Assessment & Plan Note (Signed)
Eliquis on hold for surgery tomorrow.  We will give a dose of Lovenox this morning and again this evening and hold Lovenox after tonight's dose.

## 2021-06-22 NOTE — Assessment & Plan Note (Addendum)
Placed back on Eliquis.

## 2021-06-23 ENCOUNTER — Encounter: Payer: Self-pay | Admitting: Internal Medicine

## 2021-06-23 ENCOUNTER — Inpatient Hospital Stay: Payer: Medicare Other | Admitting: Anesthesiology

## 2021-06-23 ENCOUNTER — Encounter
Admission: EM | Disposition: A | Discharge status: Skilled Nursing Facility | Payer: Self-pay | Source: Home / Self Care | Attending: Internal Medicine

## 2021-06-23 ENCOUNTER — Other Ambulatory Visit: Payer: Self-pay

## 2021-06-23 ENCOUNTER — Inpatient Hospital Stay: Payer: Medicare Other

## 2021-06-23 DIAGNOSIS — J988 Other specified respiratory disorders: Secondary | ICD-10-CM

## 2021-06-23 HISTORY — PX: INTRAMEDULLARY (IM) NAIL INTERTROCHANTERIC: SHX5875

## 2021-06-23 LAB — BASIC METABOLIC PANEL
Anion gap: 3 — ABNORMAL LOW (ref 5–15)
BUN: 19 mg/dL (ref 8–23)
CO2: 27 mmol/L (ref 22–32)
Calcium: 8.3 mg/dL — ABNORMAL LOW (ref 8.9–10.3)
Chloride: 105 mmol/L (ref 98–111)
Creatinine, Ser: 0.97 mg/dL (ref 0.61–1.24)
GFR, Estimated: >60 mL/min (ref >60)
Glucose, Bld: 99 mg/dL (ref 70–99)
Potassium: 4.1 mmol/L (ref 3.5–5.1)
Sodium: 135 mmol/L (ref 135–145)

## 2021-06-23 LAB — CBC
HCT: 29 % — ABNORMAL LOW (ref 39.0–52.0)
HCT: 29.7 % — ABNORMAL LOW (ref 39.0–52.0)
Hemoglobin: 9.2 g/dL — ABNORMAL LOW (ref 13.0–17.0)
Hemoglobin: 9.3 g/dL — ABNORMAL LOW (ref 13.0–17.0)
MCH: 28.8 pg (ref 26.0–34.0)
MCH: 29 pg (ref 26.0–34.0)
MCHC: 31.7 g/dL (ref 30.0–36.0)
MCHC: 31.3 g/dL (ref 30.0–36.0)
MCV: 90.9 fL (ref 80.0–100.0)
MCV: 92.5 fL (ref 80.0–100.0)
Platelets: 201 10*3/uL (ref 150–400)
Platelets: 200 10*3/uL (ref 150–400)
RBC: 3.19 MIL/uL — ABNORMAL LOW (ref 4.22–5.81)
RBC: 3.21 MIL/uL — ABNORMAL LOW (ref 4.22–5.81)
RDW: 14.6 % (ref 11.5–15.5)
RDW: 14.7 % (ref 11.5–15.5)
WBC: 7.7 10*3/uL (ref 4.0–10.5)
WBC: 7.3 10*3/uL (ref 4.0–10.5)
nRBC: 0 % (ref 0.0–0.2)
nRBC: 0 % (ref 0.0–0.2)

## 2021-06-23 LAB — SURGICAL PCR SCREEN
MRSA, PCR: POSITIVE — AB
Staphylococcus aureus: POSITIVE — AB

## 2021-06-23 SURGERY — FIXATION, FRACTURE, INTERTROCHANTERIC, WITH INTRAMEDULLARY ROD
Anesthesia: General | Laterality: Left

## 2021-06-23 MED ORDER — DEXAMETHASONE SODIUM PHOSPHATE 10 MG/ML IJ SOLN
INTRAMUSCULAR | Status: AC
  Filled 2021-06-23: qty 1

## 2021-06-23 MED ORDER — BISACODYL 10 MG RE SUPP: 10.0000 mg | Freq: Every day | RECTAL | Status: DC | PRN

## 2021-06-23 MED ORDER — PROPOFOL 10 MG/ML IV BOLUS
INTRAVENOUS | Status: DC | PRN
Start: 2021-06-23 — End: 2021-06-23
  Administered 2021-06-23: 100 mg via INTRAVENOUS

## 2021-06-23 MED ORDER — CHLORHEXIDINE GLUCONATE CLOTH 2 % EX PADS: 6.0000 | MEDICATED_PAD | Freq: Once | CUTANEOUS | Status: DC

## 2021-06-23 MED ORDER — PHENYLEPHRINE HCL (PRESSORS) 10 MG/ML IV SOLN
INTRAVENOUS | Status: DC | PRN
  Administered 2021-06-23: 160 ug via INTRAVENOUS

## 2021-06-23 MED ORDER — LIDOCAINE HCL (CARDIAC) PF 100 MG/5ML IV SOSY
PREFILLED_SYRINGE | INTRAVENOUS | Status: DC | PRN
  Administered 2021-06-23: 100 mg via INTRAVENOUS

## 2021-06-23 MED ORDER — FENTANYL CITRATE (PF) 100 MCG/2ML IJ SOLN: 25.0000 ug | INTRAMUSCULAR | Status: DC | PRN

## 2021-06-23 MED ORDER — MAGNESIUM HYDROXIDE 400 MG/5ML PO SUSP: 30.0000 mL | Freq: Every day | ORAL | Status: DC | PRN

## 2021-06-23 MED ORDER — METOCLOPRAMIDE HCL 5 MG/ML IJ SOLN: 5.0000 mg | Freq: Three times a day (TID) | INTRAMUSCULAR | Status: DC | PRN

## 2021-06-23 MED ORDER — CHLORHEXIDINE GLUCONATE CLOTH 2 % EX PADS
6.0000 | MEDICATED_PAD | Freq: Every day | CUTANEOUS | Status: DC
  Administered 2021-06-23 – 2021-06-26 (×4): 6 via TOPICAL

## 2021-06-23 MED ORDER — ROCURONIUM BROMIDE 10 MG/ML (PF) SYRINGE
PREFILLED_SYRINGE | INTRAVENOUS | Status: AC
  Filled 2021-06-23: qty 10

## 2021-06-23 MED ORDER — ONDANSETRON HCL 4 MG/2ML IJ SOLN: 4.0000 mg | Freq: Four times a day (QID) | INTRAMUSCULAR | Status: DC | PRN

## 2021-06-23 MED ORDER — 0.9 % SODIUM CHLORIDE (POUR BTL) OPTIME
TOPICAL | Status: DC | PRN
  Administered 2021-06-23: 500 mL

## 2021-06-23 MED ORDER — SODIUM CHLORIDE 0.9 % IV SOLN: INTRAVENOUS | Status: DC

## 2021-06-23 MED ORDER — IPRATROPIUM-ALBUTEROL 0.5-2.5 (3) MG/3ML IN SOLN
3.0000 mL | Freq: Four times a day (QID) | RESPIRATORY_TRACT | Status: DC
  Administered 2021-06-23 – 2021-06-25 (×7): 3 mL via RESPIRATORY_TRACT
  Filled 2021-06-23 (×7): qty 3

## 2021-06-23 MED ORDER — SUGAMMADEX SODIUM 200 MG/2ML IV SOLN
INTRAVENOUS | Status: DC | PRN
  Administered 2021-06-23: 200 mg via INTRAVENOUS

## 2021-06-23 MED ORDER — ROCURONIUM BROMIDE 100 MG/10ML IV SOLN
INTRAVENOUS | Status: DC | PRN
  Administered 2021-06-23: 10 mg via INTRAVENOUS
  Administered 2021-06-23: 50 mg via INTRAVENOUS

## 2021-06-23 MED ORDER — FENTANYL CITRATE (PF) 100 MCG/2ML IJ SOLN
INTRAMUSCULAR | Status: AC
  Filled 2021-06-23: qty 2

## 2021-06-23 MED ORDER — MUPIROCIN 2 % EX OINT
1.0000 "application " | TOPICAL_OINTMENT | Freq: Two times a day (BID) | CUTANEOUS | Status: DC
  Administered 2021-06-23 – 2021-06-27 (×9): 1 via NASAL
  Filled 2021-06-23: qty 22

## 2021-06-23 MED ORDER — ALUM & MAG HYDROXIDE-SIMETH 200-200-20 MG/5ML PO SUSP: 30.0000 mL | ORAL | Status: DC | PRN

## 2021-06-23 MED ORDER — PHENOL 1.4 % MT LIQD
1.0000 | OROMUCOSAL | Status: DC | PRN
  Filled 2021-06-23: qty 177

## 2021-06-23 MED ORDER — MENTHOL 3 MG MT LOZG
1.0000 | LOZENGE | OROMUCOSAL | Status: DC | PRN
  Filled 2021-06-23: qty 9

## 2021-06-23 MED ORDER — IPRATROPIUM-ALBUTEROL 0.5-2.5 (3) MG/3ML IN SOLN
3.0000 mL | RESPIRATORY_TRACT | Status: AC
  Administered 2021-06-23: 3 mL via RESPIRATORY_TRACT
  Filled 2021-06-23: qty 3

## 2021-06-23 MED ORDER — ONDANSETRON HCL 4 MG PO TABS: 4.0000 mg | ORAL_TABLET | Freq: Four times a day (QID) | ORAL | Status: DC | PRN

## 2021-06-23 MED ORDER — LIDOCAINE HCL (PF) 2 % IJ SOLN
INTRAMUSCULAR | Status: AC
  Filled 2021-06-23: qty 5

## 2021-06-23 MED ORDER — ONDANSETRON HCL 4 MG/2ML IJ SOLN
INTRAMUSCULAR | Status: AC
  Filled 2021-06-23: qty 2

## 2021-06-23 MED ORDER — ONDANSETRON HCL 4 MG/2ML IJ SOLN: 4.0000 mg | Freq: Once | INTRAMUSCULAR | Status: DC | PRN

## 2021-06-23 MED ORDER — DOCUSATE SODIUM 100 MG PO CAPS: 100.0000 mg | ORAL_CAPSULE | Freq: Two times a day (BID) | ORAL | Status: DC

## 2021-06-23 MED ORDER — CEFAZOLIN SODIUM-DEXTROSE 2-4 GM/100ML-% IV SOLN
2.0000 g | Freq: Four times a day (QID) | INTRAVENOUS | Status: AC
  Administered 2021-06-24 (×3): 2 g via INTRAVENOUS
  Filled 2021-06-23 (×3): qty 100

## 2021-06-23 MED ORDER — METOCLOPRAMIDE HCL 10 MG PO TABS: 5.0000 mg | ORAL_TABLET | Freq: Three times a day (TID) | ORAL | Status: DC | PRN

## 2021-06-23 MED ORDER — NEOMYCIN-POLYMYXIN B GU 40-200000 IR SOLN
Status: DC | PRN
  Administered 2021-06-23: 2 mL

## 2021-06-23 MED ORDER — CHLORHEXIDINE GLUCONATE CLOTH 2 % EX PADS: 6.0000 | MEDICATED_PAD | Freq: Every day | CUTANEOUS | Status: DC

## 2021-06-23 MED ORDER — DEXAMETHASONE SODIUM PHOSPHATE 10 MG/ML IJ SOLN
INTRAMUSCULAR | Status: DC | PRN
  Administered 2021-06-23: 10 mg via INTRAVENOUS

## 2021-06-23 MED ORDER — APIXABAN 5 MG PO TABS
5.0000 mg | ORAL_TABLET | Freq: Two times a day (BID) | ORAL | Status: DC
  Administered 2021-06-24 – 2021-06-27 (×7): 5 mg via ORAL
  Filled 2021-06-23 (×7): qty 1

## 2021-06-23 MED ORDER — ONDANSETRON HCL 4 MG/2ML IJ SOLN
INTRAMUSCULAR | Status: DC | PRN
  Administered 2021-06-23: 4 mg via INTRAVENOUS

## 2021-06-23 MED ORDER — FENTANYL CITRATE (PF) 100 MCG/2ML IJ SOLN
INTRAMUSCULAR | Status: DC | PRN
  Administered 2021-06-23 (×2): 50 ug via INTRAVENOUS

## 2021-06-23 MED ORDER — TRANEXAMIC ACID-NACL 1000-0.7 MG/100ML-% IV SOLN
1000.0000 mg | Freq: Once | INTRAVENOUS | Status: AC
  Administered 2021-06-23: 1000 mg via INTRAVENOUS
  Filled 2021-06-23: qty 100

## 2021-06-23 SURGICAL SUPPLY — 40 items
BIT DRILL CROWE POINT TWST 4.3 (DRILL) IMPLANT
BNDG COHESIVE 4X5 TAN ST LF (GAUZE/BANDAGES/DRESSINGS) ×4 IMPLANT
CHLORAPREP W/TINT 26 (MISCELLANEOUS) ×2 IMPLANT
CORTICAL BONE SCR 5.0MM X 46MM (Screw) ×2 IMPLANT
DRAPE 3/4 80X56 (DRAPES) ×2 IMPLANT
DRAPE U-SHAPE 47X51 STRL (DRAPES) ×2 IMPLANT
DRILL CROWE POINT TWIST 4.3 (DRILL) ×2
DRSG OPSITE POSTOP 3X4 (GAUZE/BANDAGES/DRESSINGS) ×4 IMPLANT
DRSG OPSITE POSTOP 4X6 (GAUZE/BANDAGES/DRESSINGS) ×1 IMPLANT
GAUZE SPONGE 4X4 12PLY STRL (GAUZE/BANDAGES/DRESSINGS) ×1 IMPLANT
GLOVE SURG SYN 9.0  PF PI (GLOVE) ×2
GLOVE SURG SYN 9.0 PF PI (GLOVE) ×1 IMPLANT
GLOVE SURG UNDER POLY LF SZ9 (GLOVE) ×2 IMPLANT
GOWN SRG 2XL LVL 4 RGLN SLV (GOWNS) ×1 IMPLANT
GOWN STRL NON-REIN 2XL LVL4 (GOWNS) ×2
GOWN STRL REUS W/ TWL LRG LVL3 (GOWN DISPOSABLE) ×1 IMPLANT
GOWN STRL REUS W/TWL LRG LVL3 (GOWN DISPOSABLE) ×2
GUIDEPIN VERSANAIL DSP 3.2X444 (ORTHOPEDIC DISPOSABLE SUPPLIES) ×1 IMPLANT
GUIDEWIRE NATURAL NAIL 3X100 (WIRE) ×1 IMPLANT
KIT TURNOVER KIT A (KITS) ×2 IMPLANT
MANIFOLD NEPTUNE II (INSTRUMENTS) ×2 IMPLANT
MAT ABSORB  FLUID 56X50 GRAY (MISCELLANEOUS) ×2
MAT ABSORB FLUID 56X50 GRAY (MISCELLANEOUS) ×1 IMPLANT
NAIL HIP FRA AFFIX 130X9X380 L (Nail) ×1 IMPLANT
NDL FILTER BLUNT 18X1 1/2 (NEEDLE) ×1 IMPLANT
NEEDLE FILTER BLUNT 18X 1/2SAF (NEEDLE) ×1
NEEDLE FILTER BLUNT 18X1 1/2 (NEEDLE) ×1 IMPLANT
NS IRRIG 500ML POUR BTL (IV SOLUTION) ×2 IMPLANT
PACK HIP COMPR (MISCELLANEOUS) ×2 IMPLANT
PAD ABD DERMACEA PRESS 5X9 (GAUZE/BANDAGES/DRESSINGS) ×1 IMPLANT
SCALPEL PROTECTED #15 DISP (BLADE) ×4 IMPLANT
SCREW CORTICL BON 5.0MM X 46MM (Screw) IMPLANT
SCREW LAG 10.5MMX105MM HFN (Screw) ×1 IMPLANT
STAPLER SKIN PROX 35W (STAPLE) ×2 IMPLANT
SUT VIC AB 0 CT1 36 (SUTURE) ×1 IMPLANT
SUT VIC AB 1 CT1 36 (SUTURE) ×2 IMPLANT
SUT VIC AB 2-0 CT1 (SUTURE) ×2 IMPLANT
SYR 10ML LL (SYRINGE) ×2 IMPLANT
SYR BULB IRRIG 60ML STRL (SYRINGE) ×2 IMPLANT
WATER STERILE IRR 500ML POUR (IV SOLUTION) ×2 IMPLANT
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
## 2021-06-23 NOTE — Assessment & Plan Note (Addendum)
This morning was very congested and needed nebulizer treatment and suctioning.  I also gave IV glycopyrrolate.  On reevaluation patient moving better air and less congestion.  [facility] able to give nebulizers and I will prescribe that 3 times a day.  Also will give glycopyrrolate 3 times a day 1 mg.
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
## 2021-06-23 NOTE — Transfer of Care (Signed)
Immediate Anesthesia Transfer of Care Note  Patient: Tradarius Reinwald  Procedure(s) Performed: INTRAMEDULLARY (IM) NAIL INTERTROCHANTRIC (Left)  Patient Location: PACU  Anesthesia Type:General  Level of Consciousness: drowsy  Airway & Oxygen Therapy: Patient Spontanous Breathing and Patient connected to face mask oxygen  Post-op Assessment: Report given to RN and Post -op Vital signs reviewed and stable  Post vital signs: Reviewed and stable  Last Vitals:  Vitals Value Taken Time  BP 104/62 06/23/21 1950  Temp    Pulse 37 06/23/21 1953  Resp 14 06/23/21 1953  SpO2 98 % 06/23/21 1953  Vitals shown include unvalidated device data.  Last Pain:  Vitals:   06/23/21 1728  TempSrc: Temporal  PainSc:          Complications: No notable events documented.

## 2021-06-23 NOTE — Progress Notes (Signed)
Per Dr. Rosita Kea, sinimet medication should be held until surgery. Patient's wife is signing consent for surgery. Per Dr. Renae Gloss, patient should receive nebulizer tx's prior to surgery. I have also continued oral suction orders.   I have instructed patient's wife and son on how to use yaunker. Patient's family members verified understanding.

## 2021-06-23 NOTE — Progress Notes (Signed)
°  Progress Note   Patient: Gregory [name] [id number] DOB: [birth year] DOA: 06/21/2021     2 DOS: the patient was seen and examined on 06/23/2021    Assessment and Plan: * Closed left hip fracture ([place])- (present on admission) Case discussed with Dr. Rudene Christians for procedure today. Using nebulizer treatments to clear up some upper airway congestion.  No contraindications to surgery at this time.   Congestion of upper airway The patient this morning had congestion in the upper airways.  I ordered a DuoNeb nebulizer treatment and this seemed to clear this up a little bit.  We will give DuoNeb around-the-clock.  We will get speech therapy evaluation for food after surgery today.  Atrial fibrillation (HCC)-resolved as of 06/22/2021, (present on admission) Paroxysmal in nature.  Holding Eliquis for procedure.  Patient received Lovenox yesterday.  Blood thinners on hold today for surgery.  Coronary artery disease involving coronary bypass graft of native heart without angina pectoris- (present on admission) No contraindications to surgery at this time  Parkinson's disease ([place])- (present on admission) Continue Sinemet  Hyperlipidemia- (present on admission) Continue atorvastatin  Hypothyroidism- (present on admission) TSH of 5.816 / FT4 of 1.13 Continue levothyroxine  Pressure injury of skin- (present on admission) As per nursing staff stage II decubiti on buttock area.        Subjective: Patient answers a few questions.  Able to wiggle his toes bilaterally.  Able to lift his arms up off the bed.   Physical Exam: Vitals:   06/22/21 1703 06/22/21 2119 06/23/21 0523 06/23/21 0811  BP: 102/62 97/78 97/75  (!) 108/42  Pulse: 63 68 76 67  Resp: 14 17 18 16   Temp: 97.9 F (36.6 C) 99.3 F (37.4 C) 98.3 F (36.8 C) 98.9 F (37.2 C)  TempSrc:  Oral    SpO2: 95% 96% (!) 88% 99%  Weight:       Physical Exam HENT:     Head: Normocephalic.     Mouth/Throat:     Pharynx: No  oropharyngeal exudate.  Eyes:     General: Lids are normal.     Conjunctiva/sclera: Conjunctivae normal.  Cardiovascular:     Rate and Rhythm: Normal rate and regular rhythm.     Heart sounds: Normal heart sounds, S1 normal and S2 normal.  Pulmonary:     Breath sounds: Normal breath sounds. Transmitted upper airway sounds present. No decreased breath sounds, wheezing, rhonchi or rales.     Comments: Upper airway transmitted sounds better after nebulizer treatment Abdominal:     Palpations: Abdomen is soft.     Tenderness: There is no abdominal tenderness.  Musculoskeletal:     Right ankle: No swelling.     Left ankle: No swelling.  Skin:    General: Skin is warm.     Comments: As per nursing staff stage II decubiti sacral area  Neurological:     Mental Status: He is alert.     Comments: Answers a few simple questions     Data Reviewed: Hemoglobin down to 9.2 today.  Continue to monitor closely.  Reviewed abdominal x-ray from yesterday and no signs of bowel obstruction.  Family Communication: Spoke with patient's wife on the phone  Disposition: Status is: Inpatient Remains inpatient appropriate because: Patient will go for hip surgery today   Planned Discharge Destination: Skilled nursing facility  Author: Loletha Grayer, MD 06/23/2021 10:54 AM  For on call review [email or web address].

## 2021-06-23 NOTE — Progress Notes (Signed)
SLP HOLD Note  Patient Details Name: Gregory [name] MRN: [id number] DOB: 03-Sep-1940   Missed treatment:       Reason Eval/Treat Not Yet Completed: Medical issues which prohibited therapy  Order received and appreciated. Patient NPO for OR this date, per MD [place] note SLP to eval after surgery. SLP team to hold until surgery completed and will follow up for swallow evaluation as able and appropriate after surgery.  [place] L Juliette Standre 06/23/2021, 8:49 AM

## 2021-06-23 NOTE — Op Note (Signed)
06/23/2021  7:53 PM  PATIENT:  Gregory [name]  81 y.o. male  PRE-OPERATIVE DIAGNOSIS:  Left hip fracture intertrochanteric  POST-OPERATIVE DIAGNOSIS:  Left hip fracture intertrochanteric  PROCEDURE:  Procedure(s): INTRAMEDULLARY (IM) NAIL INTERTROCHANTRIC (Left)  SURGEON: Laurene Footman, MD  ASSISTANTS: None  ANESTHESIA:   general  EBL:  Total I/O In: 500 [I.V.:500] Out: 50 [Blood:50]  BLOOD ADMINISTERED:none  DRAINS: none   LOCAL MEDICATIONS USED:  NONE  SPECIMEN:  No Specimen  DISPOSITION OF SPECIMEN:  N/A  COUNTS:  YES  TOURNIQUET:  * No tourniquets in log *  IMPLANTS: Biomet affixes 9 x 380 rod with 105 leg screw 46 mm distal interlocking screw,  DICTATION: .Dragon Dictation patient was brought the operating room and after adequate anesthesia was obtained the patient was transferred to the fracture table with the right leg in the well-leg holder and the left foot in the traction boot with traction applied.  C arm was brought in and visualization obtained showing acceptable alignment in both AP and lateral projections with traction applied.  The hip was then prepped and draped using a barrier drape massive and appropriate patient identification timeout procedure completed.  A small proximal incision was made and a guidewire inserted to the tip of the trochanter followed by proximal reaming placing the guidewire measurement made and rod length determined the 9 mm rod was inserted without reaming to prevent as much postop bleeding.  The Robbs inserted to the appropriate depth and a small lateral incision made with the drill guide a center center wire position was placed measurement made drilling and placing of the 105 leg screw with traction released and compression applied followed by tightening the screw proximally to allow for further compression.  The insertion head was removed after AP lateral images obtained proximally.  Going distally with perfect circle technique the  distal oblique screw hole was filled using a single screw drilling measuring and placing the 46 mm cortical screw getting both cortices.  The wounds were then irrigated and closed with fibrillar placed deep in the proximal incision to help minimize postop bleeding.  0 Vicryl was used to repair the deep fascia 2-0 Vicryl subcutaneously and skin staples with honeycomb dressing for the 2 distal incisions Xeroform 4 x 4 ABD and tape over the proximal incision.  PLAN OF CARE: Admit to inpatient   PATIENT DISPOSITION:  PACU - hemodynamically stable.

## 2021-06-23 NOTE — Progress Notes (Signed)
Upper airway congestion Lungs clear.  Took out top dentures and put in container in room.  Nebulizer treatment ordered.  Speech therapy to eval after surgery today.  Dr Renae Gloss

## 2021-06-23 NOTE — Care Management Important Message (Signed)
Important Message  Patient Details  Name: Gregory [name] MRN: [id number] Date of Birth: [birth year]   Medicare Important Message Given:  N/A - LOS <3 / Initial given by admissions     [name] 06/23/2021, 8:24 AM

## 2021-06-23 NOTE — Anesthesia Procedure Notes (Signed)
Procedure Name: Intubation Date/Time: 06/23/2021 6:50 PM Performed by: Irving Burton, CRNA Pre-anesthesia Checklist: Patient identified, Patient being monitored, Timeout performed, Emergency Drugs available and Suction available Patient Re-evaluated:Patient Re-evaluated prior to induction Oxygen Delivery Method: Circle system utilized Preoxygenation: Pre-oxygenation with 100% oxygen Induction Type: IV induction Ventilation: Mask ventilation without difficulty Laryngoscope Size: McGraph and 4 Grade View: Grade I Tube type: Oral Tube size: 7.5 mm Number of attempts: 1 Airway Equipment and Method: Stylet Placement Confirmation: ETT inserted through vocal cords under direct vision, positive ETCO2 and breath sounds checked- equal and bilateral Secured at: 21 cm Tube secured with: Tape Dental Injury: Teeth and Oropharynx as per pre-operative assessment

## 2021-06-23 NOTE — Progress Notes (Signed)
Anemia secondary to blood loss, fracture and anticoagulation. Recheck CBC this afternoon.

## 2021-06-23 NOTE — Anesthesia Postprocedure Evaluation (Signed)
Anesthesia Post Note  Patient: Gregory [name]  Procedure(s) Performed: INTRAMEDULLARY (IM) NAIL INTERTROCHANTRIC (Left)  Patient location during evaluation: PACU Anesthesia Type: General Level of consciousness: awake and alert Pain management: pain level controlled Vital Signs Assessment: post-procedure vital signs reviewed and stable Respiratory status: spontaneous breathing, nonlabored ventilation, respiratory function stable and patient connected to nasal cannula oxygen Cardiovascular status: blood pressure returned to baseline and stable Postop Assessment: no apparent nausea or vomiting Anesthetic complications: no   No notable events documented.   Last Vitals:  Vitals:   06/23/21 2045 06/23/21 2115  BP: 107/66 (!) 119/55  Pulse:  70  Resp: 18 15  Temp: (!) 36.4 C 36.6 C  SpO2: 92% 95%    Last Pain:  Vitals:   06/23/21 2045  TempSrc:   PainSc: 0-No pain                 Lenard Simmer

## 2021-06-24 DIAGNOSIS — D62 Acute posthemorrhagic anemia: Secondary | ICD-10-CM

## 2021-06-24 DIAGNOSIS — L89151 Pressure ulcer of sacral region, stage 1: Secondary | ICD-10-CM

## 2021-06-24 LAB — CBC
HCT: 27.5 % — ABNORMAL LOW (ref 39.0–52.0)
Hemoglobin: 8.9 g/dL — ABNORMAL LOW (ref 13.0–17.0)
MCH: 29.7 pg (ref 26.0–34.0)
MCHC: 32.4 g/dL (ref 30.0–36.0)
MCV: 91.7 fL (ref 80.0–100.0)
Platelets: 199 10*3/uL (ref 150–400)
RBC: 3 MIL/uL — ABNORMAL LOW (ref 4.22–5.81)
RDW: 14.6 % (ref 11.5–15.5)
WBC: 5.9 10*3/uL (ref 4.0–10.5)
nRBC: 0 % (ref 0.0–0.2)

## 2021-06-24 LAB — BASIC METABOLIC PANEL
Anion gap: 7 (ref 5–15)
BUN: 19 mg/dL (ref 8–23)
CO2: 25 mmol/L (ref 22–32)
Calcium: 8.2 mg/dL — ABNORMAL LOW (ref 8.9–10.3)
Chloride: 104 mmol/L (ref 98–111)
Creatinine, Ser: 0.77 mg/dL (ref 0.61–1.24)
GFR, Estimated: >60 mL/min (ref >60)
Glucose, Bld: 154 mg/dL — ABNORMAL HIGH (ref 70–99)
Potassium: 4 mmol/L (ref 3.5–5.1)
Sodium: 136 mmol/L (ref 135–145)
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
## 2021-06-24 NOTE — Evaluation (Signed)
Physical Therapy Evaluation Patient Details Name: Gregory [name] MRN: [id number] DOB: 1940/09/16 Today's Date: 06/24/2021  History of Present Illness  Pt is an 81 y/o M admitted on 06/21/21 with c/o falling & landing on L hip. Pt found to have L hip intertrochanteric fx & underwent IM nail. PMH: AAA, a-fib, heart disease, bilateral carotid artery stenosis, CAD, dementia, HLD, parkinsonism  Clinical Impression  Pt seen for PT evaluation with co-tx with OT. Pt presents with impaired cognition but has hx of dementia; pt pleasant throughout session. Pt provides home set up/PLOF information that contradicts latest chart entry. Pt does endorse he receives [facility] but unable to elaborate on what services they provide. Pt incontinent of BM & void upon therapy arrival & requires total assist for peri hygiene. Pt requires +2 assist for supine<>sit & +2 assist for sit>stand with max assist fade to mod assist from elevated EOB. While standing EOB pt demonstrates posterior lean & is unable to weight shift L<>R to take steps at EOB. Pt ultimately assisted back to bed & left with all needs met. Pt would benefit from STR upon d/c to maximize independence with functional mobility & reduce fall risk prior to return home.        Recommendations for follow up therapy are one component of a multi-disciplinary discharge planning process, led by the attending physician.  Recommendations may be updated based on patient status, additional functional criteria and insurance authorization.  Follow Up Recommendations Skilled nursing-short term rehab (<3 hours/day)    Assistance Recommended at Discharge Frequent or constant Supervision/Assistance  Patient can return home with the following  Two people to help with walking and/or transfers;Two people to help with bathing/dressing/bathroom;Direct supervision/assist for medications management;Help with stairs or ramp for entrance;Assistance with feeding;Assist for  transportation;Direct supervision/assist for financial management;Assistance with cooking/housework    Equipment Recommendations None recommended by PT  Recommendations for Other Services       Functional Status Assessment Patient has had a recent decline in their functional status and demonstrates the ability to make significant improvements in function in a reasonable and predictable amount of time.     Precautions / Restrictions Precautions Precautions: Fall Restrictions Weight Bearing Restrictions: Yes LLE Weight Bearing: Weight bearing as tolerated      Mobility  Bed Mobility Overal bed mobility: Needs Assistance Bed Mobility: Supine to Sit, Sit to Supine, Rolling Rolling: Max assist   Supine to sit: Total assist, +2 for physical assistance, +2 for safety/equipment, HOB elevated Sit to supine: Total assist, HOB elevated, +2 for physical assistance, +2 for safety/equipment        Transfers Overall transfer level: Needs assistance Equipment used: Rolling walker (2 wheels) Transfers: Sit to/from Stand Sit to Stand: Max assist, Mod assist, From elevated surface, +2 physical assistance (max fade to mod assist +2 with 3-4 sit>stand throughout session)           General transfer comment: poor awareness of safe hand placement as pt with BUE remaining on RW throughout session    Ambulation/Gait                  Stairs            Wheelchair Mobility    Modified Rankin (Stroke Patients Only)       Balance Overall balance assessment: Needs assistance Sitting-balance support: Bilateral upper extremity supported Sitting balance-Leahy Scale: Poor Sitting balance - Comments: CGA static sitting, endorses fear of falling sitting EOB   Standing balance support: Bilateral upper  extremity supported, During functional activity Standing balance-Leahy Scale: Poor Standing balance comment: posterior lean, unable to shift pelvis anteriorly despite multiple cuing,  appears fearful of movement                             Pertinent Vitals/Pain Pain Assessment Pain Assessment: Faces Faces Pain Scale: Hurts a little bit Pain Location: L hip with PT performing ROM Pain Descriptors / Indicators: Discomfort, Grimacing Pain Intervention(s): Monitored during session, Limited activity within patient's tolerance, Repositioned    Home Living Family/patient expects to be discharged to:: Private residence Living Arrangements: Spouse/significant other Available Help at Discharge: Family Type of Home: House             Additional Comments: Pt reports 2 level home with bed/bath upstairs & 1 step to enter but per old entry in chart pt lives in a multi level home with ramped entrance.    Prior Function               Mobility Comments: Per chart pt is ambulatory with RW & +1 assist but pt reports use of walker (unsure if 2 wheels vs 4 wheels) but also uses a w/c. ADLs Comments: Pt reports he uses depends at home.     Hand Dominance        Extremity/Trunk Assessment   Upper Extremity Assessment Upper Extremity Assessment: Generalized weakness    Lower Extremity Assessment Lower Extremity Assessment:  (RLE 3-/5 knee extension in sitting, LLE knee extension 2+/5 in sitting)    Cervical / Trunk Assessment Cervical / Trunk Assessment: Kyphotic  Communication   Communication: No difficulties  Cognition Arousal/Alertness: Awake/alert   Overall Cognitive Status: History of cognitive impairments - at baseline (hx of dementia per chart but no family to determine baseline cognition, pt able to report he fell at home, oriented to self)                                 General Comments: pleasant & cooperative throughout session        General Comments General comments (skin integrity, edema, etc.): Pt incontinent of urine & BM upon therapy arrival with therapist's providing total assist for peri hygiene.    Exercises      Assessment/Plan    PT Assessment Patient needs continued PT services  PT Problem List Decreased strength;Decreased mobility;Decreased safety awareness;Decreased range of motion;Decreased coordination;Decreased knowledge of precautions;Decreased activity tolerance;Decreased balance;Cardiopulmonary status limiting activity;Decreased cognition;Decreased knowledge of use of DME;Pain;Decreased skin integrity       PT Treatment Interventions Therapeutic exercise;DME instruction;Gait training;Balance training;Cognitive remediation;Neuromuscular re-education;Functional mobility training;Modalities;Stair training;Therapeutic activities;Patient/family education    PT Goals (Current goals can be found in the Care Plan section)  Acute Rehab PT Goals Patient Stated Goal: see his wife today PT Goal Formulation: With patient Time For Goal Achievement: 07/08/21 Potential to Achieve Goals: Fair    Frequency BID     Co-evaluation PT/OT/SLP Co-Evaluation/Treatment: Yes Reason for Co-Treatment: Necessary to address cognition/behavior during functional activity;To address functional/ADL transfers;Complexity of the patient's impairments (multi-system involvement);For patient/therapist safety PT goals addressed during session: Mobility/safety with mobility;Balance;Proper use of DME;Strengthening/ROM         AM-PAC PT "6 Clicks" Mobility  Outcome Measure Help needed turning from your back to your side while in a flat bed without using bedrails?: Total Help needed moving from lying on your back to sitting on  the side of a flat bed without using bedrails?: Total Help needed moving to and from a bed to a chair (including a wheelchair)?: Total Help needed standing up from a chair using your arms (e.g., wheelchair or bedside chair)?: Total Help needed to walk in hospital room?: Total Help needed climbing 3-5 steps with a railing? : Total 6 Click Score: 6    End of Session Equipment Utilized During  Treatment: Gait belt Activity Tolerance: Patient tolerated treatment well Patient left: in bed;with call bell/phone within reach;with bed alarm set;with SCD's reapplied Nurse Communication: Mobility status;Weight bearing status (blood on LLE dressings) PT Visit Diagnosis: Unsteadiness on feet (R26.81);Muscle weakness (generalized) (M62.81);Difficulty in walking, not elsewhere classified (R26.2)    Time: 3967-2897 PT Time Calculation (min) (ACUTE ONLY): 28 min   Charges:   PT Evaluation $PT Eval Moderate Complexity: [place], PT, DPT 06/24/21, 10:13 AM   Waunita Schooner 06/24/2021, 10:07 AM

## 2021-06-24 NOTE — Evaluation (Signed)
Occupational Therapy Evaluation Patient Details Name: Gregory [name] MRN: [id number] DOB: [birth year] Today's Date: 06/24/2021   History of Present Illness Pt is an 81 y/o M admitted on 06/21/21 with c/o falling & landing on L hip. Pt found to have L hip intertrochanteric fx & underwent IM nail. PMH: AAA, a-fib, heart disease, bilateral carotid artery stenosis, CAD, dementia, HLD, parkinsonism   Clinical Impression   Chart reviewed, RN cleared pt for participation in OT evaluation. Co- tx completed with pt due to complexity of pt care and physical assist needed. Home set up/PLOF requires clarification however pt does report he requires assistance for all ADL/IADL at home and his wife and son help him as needed. Pt does not drive and reports use of a 4WW and mwc for mobility. On this date pt required MAX A for rolling, TOTAL A for peri care (BM management)-MAX A to clean up following urine. Pt performs supine>sit with TOTAL A +2, able to maintain seated at EOB with CGA however pt with apparent fear of falling. Pt performs STS 3x with MAX A +2 on 1 attempt, MOD A +2 on 2 attempts. TOTAL A required for LB dressing, MOD a required for UB dressing. MIN A required for grooming tasks at bed level. Per pt report, pt appears to be performing ADL/IADL below PLOF. Pt would benefit from STR to address functional deficits as detailed below. Pt is left as received,NAD, all needs met. OT will continue to follow.      Recommendations for follow up therapy are one component of a multi-disciplinary discharge planning process, led by the attending physician.  Recommendations may be updated based on patient status, additional functional criteria and insurance authorization.   Follow Up Recommendations  Skilled nursing-short term rehab (<3 hours/day)    Assistance Recommended at Discharge Frequent or constant Supervision/Assistance  Patient can return home with the following Two people to help with walking and/or  transfers;A lot of help with bathing/dressing/bathroom;Assistance with cooking/housework;Help with stairs or ramp for entrance    Functional Status Assessment  Patient has had a recent decline in their functional status and demonstrates the ability to make significant improvements in function in a reasonable and predictable amount of time.  Equipment Recommendations  Other (comment) (per next venue of care)    Recommendations for Other Services       Precautions / Restrictions Precautions Precautions: Fall Restrictions Weight Bearing Restrictions: Yes LLE Weight Bearing: Weight bearing as tolerated      Mobility Bed Mobility Overal bed mobility: Needs Assistance Bed Mobility: Supine to Sit, Sit to Supine, Rolling Rolling: Max assist (for peri care)   Supine to sit: Total assist, +2 for physical assistance, +2 for safety/equipment, HOB elevated Sit to supine: Total assist, HOB elevated, +2 for physical assistance, +2 for safety/equipment        Transfers Overall transfer level: Needs assistance Equipment used: Rolling walker (2 wheels) Transfers: Sit to/from Stand Sit to Stand: Max assist, Mod assist, From elevated surface, +2 physical assistance (MAX A +2, 1 attempt; MOD-MAX A +2 additional attempts)                  Balance Overall balance assessment: Needs assistance Sitting-balance support: Bilateral upper extremity supported Sitting balance-Leahy Scale: Poor Sitting balance - Comments: CGA static sitting   Standing balance support: Bilateral upper extremity supported, During functional activity Standing balance-Leahy Scale: Poor Standing balance comment: appears fearful of falling, tactile and vcs for upright positioning in stand  ADL either performed or assessed with clinical judgement   ADL Overall ADL's : Needs assistance/impaired     Grooming: Minimal assistance;Bed level       Lower Body Bathing: Maximal  assistance;Bed level   Upper Body Dressing : Moderate assistance;Bed level   Lower Body Dressing: Total assistance;Bed level       Toileting- Clothing Manipulation and Hygiene: Maximal assistance;Total assistance;Bed level       Functional mobility during ADLs: +2 for physical assistance       Vision   Additional Comments: will continue to assess     Perception     Praxis      Pertinent Vitals/Pain Pain Assessment Pain Assessment: No/denies pain Faces Pain Scale: Hurts a little bit Pain Location: L hip with mobility and PT performing ROM Pain Descriptors / Indicators: Discomfort, Grimacing Pain Intervention(s): Limited activity within patient's tolerance, Monitored during session, Repositioned     Hand Dominance     Extremity/Trunk Assessment Upper Extremity Assessment Upper Extremity Assessment: Generalized weakness   Lower Extremity Assessment Lower Extremity Assessment: Generalized weakness   Cervical / Trunk Assessment Cervical / Trunk Assessment: Kyphotic   Communication Communication Communication: No difficulties   Cognition Arousal/Alertness: Awake/alert Behavior During Therapy: Flat affect Overall Cognitive Status: No family/caregiver present to determine baseline cognitive functioning                                 General Comments: pt is oriented to self, grossly oriented to place and situation     General Comments  Pt incontinent of urine & BM upon therapy arrival with therapist's providing total assist for peri hygiene.    Exercises     Shoulder Instructions      Home Living Family/patient expects to be discharged to:: Private residence Living Arrangements: Spouse/significant other Available Help at Discharge: Family Type of Home: House                       Home Equipment: Tub bench;Grab bars - toilet;Grab bars - tub/shower;BSC/3in1 (3IR)   Additional Comments: pt reports mulit level house with stairs to  enter and bedroom upstairs, however previous entries report sleeping area available on ground floor      Prior Functioning/Environment               Mobility Comments: pt reports ambulatory in house with RW and furniture walking, wc for community distances; ADLs Comments: pt reports assistance from wife for all ADL/IADL        OT Problem List: Decreased strength;Decreased activity tolerance;Decreased knowledge of use of DME or AE;Impaired UE functional use;Impaired balance (sitting and/or standing);Decreased coordination;Decreased knowledge of precautions      OT Treatment/Interventions: Self-care/ADL training;Cognitive remediation/compensation;Balance training;Therapeutic exercise;DME and/or AE instruction;Therapeutic activities;Patient/family education    OT Goals(Current goals can be found in the care plan section) Acute Rehab OT Goals Patient Stated Goal: to go home OT Goal Formulation: With patient Time For Goal Achievement: 07/08/21 Potential to Achieve Goals: Good ADL Goals Pt Will Perform Grooming: sitting;standing;with min guard assist Pt Will Perform Lower Body Bathing: with min assist Pt Will Perform Upper Body Dressing: with min assist Pt Will Perform Lower Body Dressing: with mod assist Pt Will Transfer to Toilet: with min assist  OT Frequency: Min 3X/week    Co-evaluation PT/OT/SLP Co-Evaluation/Treatment: Yes Reason for Co-Treatment: Necessary to address cognition/behavior during functional activity;For patient/therapist safety;To address functional/ADL transfers PT goals  addressed during session: Mobility/safety with mobility;Balance;Proper use of DME;Strengthening/ROM OT goals addressed during session: ADL's and self-care;Proper use of Adaptive equipment and DME      AM-PAC OT "6 Clicks" Daily Activity     Outcome Measure Help from another person eating meals?: A Little Help from another person taking care of personal grooming?: A Lot Help from another  person toileting, which includes using toliet, bedpan, or urinal?: A Lot Help from another person bathing (including washing, rinsing, drying)?: A Lot Help from another person to put on and taking off regular upper body clothing?: A Lot Help from another person to put on and taking off regular lower body clothing?: Total 6 Click Score: 12   End of Session Equipment Utilized During Treatment: Gait belt;Rolling walker (2 wheels) Nurse Communication: Mobility status  Activity Tolerance: Patient tolerated treatment well Patient left: in bed;with call bell/phone within reach;with bed alarm set  OT Visit Diagnosis: History of falling (Z91.81);Other abnormalities of gait and mobility (R26.89)                Time: 3552-1747 OT Time Calculation (min): 29 min Charges:  OT General Charges $OT Visit: 1 Visit OT Evaluation $OT Eval High Complexity: 1 High OT Treatments $Self Care/Home Management : 8-22 mins  Shanon Payor, OTD OTR/L  06/24/21, 11:07 AM

## 2021-06-24 NOTE — Assessment & Plan Note (Addendum)
Hemoglobin came down to 8.2.  Today's hemoglobin 8.4.  Initial hemoglobin at 11.5.  Check CBC next week

## 2021-06-24 NOTE — Progress Notes (Signed)
Physical Therapy Treatment Patient Details Name: Gregory [name] MRN: [id number] DOB: May 06, 1941 Today's Date: 06/24/2021   History of Present Illness Pt is an 81 y/o M admitted on 06/21/21 with c/o falling & landing on L hip. Pt found to have L hip intertrochanteric fx & underwent IM nail. PMH: AAA, a-fib, heart disease, bilateral carotid artery stenosis, CAD, dementia, HLD, parkinsonism    PT Comments    Author returned after earlier attempt. Pt's son at bedside and pt is awake and much more alert. Required some encouragement however once motivated put great effort throughout remainder of session. Pt has cognition deficits at baseline and per son," Is close to this at home. He has inconsistent cognition all the time." Pt was able to achieve EOPB short sit with max assist of one. Sat EOB throughout session with mostly close supervision only. Author attempted to wean o2 however pt quickly desaturates into mid 80s. Reapplied O2 and pt maintained > 88%. He stood 3 x with Chartered loss adjuster + son assistance. Bed height was slightly elevated. Initial posterior push however with assistance was able to correct. Pt progressed to static standing with BUE support with min assist +1 only. Does present with L lateral lean at time (odd to be towards operative side). He could clear LLE from floor with increased time + assistance with lateral wt shift towards R. Overall pt did tolerate session well. Family had requested CIR at DC however pt does not qualify. Acute PT continues to recommend DC to SNF when medically cleared.    Recommendations for follow up therapy are one component of a multi-disciplinary discharge planning process, led by the attending physician.  Recommendations may be updated based on patient status, additional functional criteria and insurance authorization.  Follow Up Recommendations  Skilled nursing-short term rehab (<3 hours/day)     Assistance Recommended at Discharge Frequent or constant  Supervision/Assistance  Patient can return home with the following Two people to help with walking and/or transfers;Two people to help with bathing/dressing/bathroom;Direct supervision/assist for medications management;Help with stairs or ramp for entrance;Assistance with feeding;Assist for transportation;Direct supervision/assist for financial management;Assistance with cooking/housework   Equipment Recommendations  None recommended by PT       Precautions / Restrictions Precautions Precautions: Fall Restrictions Weight Bearing Restrictions: Yes LLE Weight Bearing: Weight bearing as tolerated     Mobility  Bed Mobility Overal bed mobility: Needs Assistance Bed Mobility: Supine to Sit, Sit to Supine, Rolling Rolling: Max assist   Supine to sit: Max assist Sit to supine: Max assist        Transfers Overall transfer level: Needs assistance Equipment used: Rolling walker (2 wheels) Transfers: Sit to/from Stand Sit to Stand: Min assist, +2 physical assistance, +2 safety/equipment, From elevated surface           General transfer comment: Pt was able to stand 3 x EOB x ~ 20sec to 1 minute. at first has severe posterior lean but with vcs and assistance was able to progress to static standing with min assist of one. Pt did progress to lifting LLE with mod assist to wt shift towards R but overall pt is very limited.    Ambulation/Gait      General Gait Details: unsafe to advance to taking steps    Balance Overall balance assessment: Needs assistance Sitting-balance support: Feet supported, Bilateral upper extremity supported Sitting balance-Leahy Scale: Fair Sitting balance - Comments: pt sat EOB x ~ 12 minutes total throughotu session with CGA-supervision   Standing balance support: Bilateral upper  extremity supported, During functional activity Standing balance-Leahy Scale: Poor Standing balance comment: Pt has L lateral lean (towards operative side (odd) during standing  trials       Cognition Arousal/Alertness: Lethargic, Suspect due to medications (but able to follow commands consistently with increased time) Behavior During Therapy: [facility] for tasks assessed/performed Overall Cognitive Status: History of cognitive impairments - at baseline    General Comments: Per son, " he cognition is like this at home too. maybe a little worse but overall he is not consistent with his cognition even at home."          PT Goals (current goals can now be found in the care plan section) Acute Rehab PT Goals Patient Stated Goal: none stated Progress towards PT goals: Progressing toward goals    Frequency    BID      PT Plan Current plan remains appropriate    Co-evaluation     PT goals addressed during session: Mobility/safety with mobility;Balance;Proper use of DME;Strengthening/ROM        AM-PAC PT "6 Clicks" Mobility   Outcome Measure  Help needed turning from your back to your side while in a flat bed without using bedrails?: A Lot Help needed moving from lying on your back to sitting on the side of a flat bed without using bedrails?: A Lot Help needed moving to and from a bed to a chair (including a wheelchair)?: Total Help needed standing up from a chair using your arms (e.g., wheelchair or bedside chair)?: Total Help needed to walk in hospital room?: Total Help needed climbing 3-5 steps with a railing? : Total 6 Click Score: 8    End of Session   Activity Tolerance: Patient tolerated treatment well Patient left: in bed;with call bell/phone within reach;with bed alarm set;with SCD's reapplied Nurse Communication: Mobility status PT Visit Diagnosis: Unsteadiness on feet (R26.81);Muscle weakness (generalized) (M62.81);Difficulty in walking, not elsewhere classified (R26.2)     Time: 1650-1720 PT Time Calculation (min) (ACUTE ONLY): 30 min  Charges:  $Therapeutic Activity: 23-37 mins                    Jetta Lout PTA 06/24/21,  6:11 PM

## 2021-06-24 NOTE — Progress Notes (Signed)
Inpatient Rehab Admissions Coordinator:   Per request of TOC, Pt. screened for CIR candidacy by Megan Salon, MS, CCC-SLP. At this time, Pt. does not appear to demonstrate medical necessity to justify in hospital rehabilitation/CIR. Payor source also will not approve CIR for an uncomplicated hip fracture. I will not pursue a rehab consult for this Pt.   Recommend other rehab venues to be pursued.  Please contact me with any questions.   Megan Salon, MS, CCC-SLP Rehab Admissions Coordinator  [phone] (celll) [phone] (office)

## 2021-06-24 NOTE — Progress Notes (Signed)
PT Cancellation Note  Patient Details Name: Creek Gan MRN: [id number] DOB: [birth year]   Cancelled Treatment:     PT attempt. Pt is extremely lethargic from recent medications given. Pt's spouse and daughter at  bedside with a lot of questions. They were not present for AM PT/OT session. Reviewed how session went and discussed current recommendation for SNF. Pt and spouse have been to CIR in past and are requesting pt return there at DC. Author discussed at length that pt may not qualify for CIR due DX but per family request, did reach out to CIR CM to let them know that they are requesting admission. Acute PT will continue to follow and progress as able per current POC. Family also did state if they were unable to go to inpatient rehab, they are agreeable to SNF bed search.    Rushie Chestnut 06/24/2021, 3:54 PM

## 2021-06-24 NOTE — Progress Notes (Signed)
°  Progress Note   Patient: Gregory [name] YBO:[id number] DOB: 1940-07-26 DOA: 06/21/2021     3 DOS: the patient was seen and examined on 06/24/2021     Assessment and Plan: * Closed left hip fracture (HCC)- (present on admission) Status post intramedullary nail placement by Dr. Rosita Kea yesterday.  Pain control.  Continue working with physical therapy.  Physical therapy recommending rehab.   Congestion of upper airway Improved with nebulizer treatment.  Patient placed on dysphagia 2 diet with nectar thickened liquids.  Atrial fibrillation (HCC)-resolved as of 06/22/2021, (present on admission) Placed back on Eliquis.  Parkinson's disease (HCC)- (present on admission) Continue Sinemet  Hyperlipidemia- (present on admission) Continue atorvastatin  Hypothyroidism- (present on admission) TSH of 5.816 / FT4 of 1.13 Continue levothyroxine  Pressure injury of skin- (present on admission) As per nursing staff stage II decubiti on buttock area.  Acute postoperative anemia due to expected blood loss Hemoglobin came down to 8.9.  Initial hemoglobin at 11.5.  Continue to watch closely with restarting Eliquis.        Subjective: Patient much more talkative today and was holding a conversation with me.  Asked about eating food.  States he has not eaten anything in 3 days.  Initially admitted with fall and found to have a hip fracture.  Physical Exam: Vitals:   06/24/21 0334 06/24/21 0742 06/24/21 0835 06/24/21 1234  BP: 114/61  (!) 101/59 (!) 111/51  Pulse: 71 68 70 77  Resp: 17 16 14 18   Temp: 98.2 F (36.8 C)  98.2 F (36.8 C) 98.2 F (36.8 C)  TempSrc:      SpO2: 98% 95% 95% 93%  Weight:       Physical Exam HENT:     Head: Normocephalic.     Mouth/Throat:     Pharynx: No oropharyngeal exudate.  Eyes:     General: Lids are normal.     Conjunctiva/sclera: Conjunctivae normal.  Cardiovascular:     Rate and Rhythm: Normal rate and regular rhythm.     Heart sounds: Normal  heart sounds, S1 normal and S2 normal.  Pulmonary:     Breath sounds: Normal breath sounds. No transmitted upper airway sounds. No decreased breath sounds, wheezing, rhonchi or rales.  Abdominal:     Palpations: Abdomen is soft.     Tenderness: There is no abdominal tenderness.  Musculoskeletal:     Right ankle: No swelling.     Left ankle: No swelling.  Skin:    General: Skin is warm.     Comments: As per nursing staff stage I decubiti sacral area  Neurological:     Mental Status: He is alert.     Comments: Much more talkative.  Able to wiggle toes.     Data Reviewed: Hemoglobin today down to 8.9  Family Communication: Spoke with patient's wife on the phone  Disposition: Status is: Inpatient Remains inpatient appropriate because: Postoperative day 1 for hip fracture  Planned Discharge Destination: Rehab  Author: , MD 06/24/2021 2:54 PM  For on call review [email or web address].
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
## 2021-06-24 NOTE — Progress Notes (Signed)
Foley catheter removed at this time. Pt tolerated it well.

## 2021-06-25 DIAGNOSIS — R410 Disorientation, unspecified: Secondary | ICD-10-CM

## 2021-06-25 DIAGNOSIS — G9341 Metabolic encephalopathy: Secondary | ICD-10-CM

## 2021-06-25 DIAGNOSIS — I4821 Permanent atrial fibrillation: Secondary | ICD-10-CM

## 2021-06-25 LAB — CBC
HCT: 25.1 % — ABNORMAL LOW (ref 39.0–52.0)
Hemoglobin: 8.2 g/dL — ABNORMAL LOW (ref 13.0–17.0)
MCH: 29.3 pg (ref 26.0–34.0)
MCHC: 32.7 g/dL (ref 30.0–36.0)
MCV: 89.6 fL (ref 80.0–100.0)
Platelets: 204 10*3/uL (ref 150–400)
RBC: 2.8 MIL/uL — ABNORMAL LOW (ref 4.22–5.81)
RDW: 14.8 % (ref 11.5–15.5)
WBC: 6.6 10*3/uL (ref 4.0–10.5)
nRBC: 0 % (ref 0.0–0.2)

## 2021-06-25 LAB — BASIC METABOLIC PANEL
Anion gap: 5 (ref 5–15)
BUN: 23 mg/dL (ref 8–23)
CO2: 27 mmol/L (ref 22–32)
Calcium: 8.3 mg/dL — ABNORMAL LOW (ref 8.9–10.3)
Chloride: 106 mmol/L (ref 98–111)
Creatinine, Ser: 1.05 mg/dL (ref 0.61–1.24)
GFR, Estimated: >60 mL/min (ref >60)
Glucose, Bld: 130 mg/dL — ABNORMAL HIGH (ref 70–99)
Potassium: 3.9 mmol/L (ref 3.5–5.1)
Sodium: 138 mmol/L (ref 135–145)

## 2021-06-25 MED ORDER — HYDROCODONE-ACETAMINOPHEN 5-325 MG PO TABS
1.0000 | ORAL_TABLET | Freq: Four times a day (QID) | ORAL | Status: DC | PRN
  Administered 2021-06-25: 1 via ORAL
  Filled 2021-06-25: qty 1

## 2021-06-25 MED ORDER — LIDOCAINE 5 % EX PTCH
1.0000 | MEDICATED_PATCH | CUTANEOUS | Status: DC
  Administered 2021-06-25 – 2021-06-27 (×3): 1 via TRANSDERMAL
  Filled 2021-06-25 (×3): qty 1

## 2021-06-25 MED ORDER — ACETAMINOPHEN 325 MG PO TABS
650.0000 mg | ORAL_TABLET | Freq: Four times a day (QID) | ORAL | Status: DC | PRN
  Administered 2021-06-26 – 2021-06-27 (×2): 650 mg via ORAL
  Filled 2021-06-25 (×2): qty 2

## 2021-06-25 MED ORDER — BISACODYL 5 MG PO TBEC
5.0000 mg | DELAYED_RELEASE_TABLET | Freq: Once | ORAL | Status: AC
  Administered 2021-06-25: 5 mg via ORAL
  Filled 2021-06-25: qty 1

## 2021-06-25 MED ORDER — IPRATROPIUM-ALBUTEROL 0.5-2.5 (3) MG/3ML IN SOLN
3.0000 mL | RESPIRATORY_TRACT | Status: DC | PRN
  Administered 2021-06-25: 3 mL via RESPIRATORY_TRACT
  Filled 2021-06-25 (×2): qty 3

## 2021-06-25 NOTE — Progress Notes (Signed)
Patient refused morning meds and told Nurse that he will not take any medication. Explained to patient that it is to help his thyroid and patient continued to refuse taking med. Will continue to monitor patient and inform oncoming Nurse.
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
## 2021-06-25 NOTE — Assessment & Plan Note (Addendum)
Late morning answering questions better than this morning.  Does have periods of confusion.

## 2021-06-25 NOTE — TOC Initial Note (Signed)
Transition of Care [facility]) - Initial/Assessment Note    Patient Details  Name: Gregory [name] MRN: [id number] Date of Birth: 23-Jun-1940  [facility]) CM/SW Contact:    Elliot Gurney [place], [place] Phone Number:[phone] 06/25/2021, 10:35 AM  Clinical Narrative:                 This Education officer, museum met with patient and spouse at bedside to discuss discharge recommendation for SNF. Both patient and spouse agreeable to SNF stay, spouse prefers [facility] if possible Patient's spouse is patient's main caregiver. They have a hospital bed in the home along with a rollator walker, bedside commode, lift chair and raised toilet seat. Patient receives his primary care through [place] NP and uses [employer]. SNF placement process explained, Fl2 and PASSAR to be completed and faxed out for potential bed offers.  Transition of Care team to follow up with patient and spouse to review bed offers.   Phill Steck [address], LCSW Transition of Care [phone]  Expected Discharge Plan: [place] Barriers to Discharge: Continued Medical Work up   Patient Goals and CMS Choice Patient states their goals for this hospitalization and ongoing recovery are:: To get to where he was before the fall CMS Medicare.gov Compare Post Acute Care list provided to:: Other (Comment Required) (spouse) Choice offered to / list presented to : Spouse  Expected Discharge Plan and Services Expected Discharge Plan: [place] In-house Referral: Clinical Social Work     Living arrangements for the past 2 months: [place]                                      Prior Living Arrangements/Services Living arrangements for the past 2 months: Single Family Home Lives with:: Spouse Patient language and need for interpreter reviewed:: Yes Do you feel safe going back to the place where you live?: Yes      Need for Family Participation in  Patient Care: Yes (Comment) Care giver support system in place?: Yes (comment) Current home services: DME (Hospital bed, bedside commode, rollator walker, lift chair, raised toilet seat) Criminal Activity/Legal Involvement Pertinent to Current Situation/Hospitalization: No - Comment as needed  Activities of Daily Living Home Assistive Devices/Equipment: Walker (specify type) ADL Screening (condition at time of admission) Patient's cognitive ability adequate to safely complete daily activities?: No Is the patient deaf or have difficulty hearing?: No Does the patient have difficulty seeing, even when wearing glasses/contacts?: No Does the patient have difficulty concentrating, remembering, or making decisions?: Yes Patient able to express need for assistance with ADLs?: No Does the patient have difficulty dressing or bathing?: Yes Independently performs ADLs?: No Communication: Dependent Is this a change from baseline?: Pre-admission baseline Dressing (OT): Dependent Is this a change from baseline?: Pre-admission baseline Grooming: Dependent Is this a change from baseline?: Pre-admission baseline Feeding: Dependent Is this a change from baseline?: Pre-admission baseline Bathing: Dependent Is this a change from baseline?: Pre-admission baseline Toileting: Dependent Is this a change from baseline?: Pre-admission baseline In/Out Bed: Dependent Is this a change from baseline?: Pre-admission baseline Walks in Home: Needs assistance Is this a change from baseline?: Pre-admission baseline Does the patient have difficulty walking or climbing stairs?: Yes Weakness of Legs: Both Weakness of Arms/Hands: Both  Permission Sought/Granted   Permission granted to share information with : Yes, Verbal Permission Granted  Permission granted to share info w Relationship: Cordero, Surette (Spouse)   [phone]  Permission granted to share info w Contact Information: [phone]  Emotional  Assessment Appearance:: Appears stated age Attitude/Demeanor/Rapport: Engaged Affect (typically observed): Accepting, Adaptable Orientation: : Oriented to Self, Oriented to Place, Oriented to Situation Alcohol / Substance Use: Not Applicable Psych Involvement: No (comment)  Admission diagnosis:  Closed left hip fracture ([place]) [S72.002A] Patient Active Problem List   Diagnosis Date Noted   Congestion of upper airway 06/23/2021   Pressure injury of skin 06/22/2021   Closed left hip fracture ([place]) 06/21/2021   Hypothyroidism 06/21/2021   Long term (current) use of anticoagulants 06/21/2021   Urinary frequency 12/23/2018   Nocturia 12/23/2018   Urge incontinence 05/13/2017   Carotid stenosis, asymptomatic, bilateral 12/12/2016   Gait disturbance, post-stroke 10/09/2016   Urinary retention 09/11/2016   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   Dysphagia, post-stroke    Parkinson's disease ([place])    Acute postoperative anemia due to expected blood loss 06/20/2016   Lumbar transverse process fracture ([place]) 06/20/2016   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   Hematuria    Right-sided cerebrovascular accident (CVA) ([place])    Cerebral thrombosis with cerebral infarction 06/07/2016   Closed T12 fracture ([place])    Coronary artery disease involving coronary bypass graft of native heart without angina pectoris    Multiple closed fractures of ribs of both sides    Hollenhorst plaque, 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   H/O heart bypass surgery 04/02/2013   CAD (coronary artery disease) 09/09/2012   Hyperlipidemia    PCP:  Holland Commons, FNP Pharmacy:   [id number], [place] [address] Phone: [phone] Fax: [phone]  [place] [address] [place] [address] Phone: [phone] Fax: [phone]  [facility]  Mail [facility] ([place], [place] [facility] [address] [place] [address] Phone: [phone] Fax: [phone]     Social Determinants of Health (SDOH) Interventions    Readmission Risk Interventions No flowsheet data found.

## 2021-06-25 NOTE — NC FL2 (Signed)
[place] LEVEL OF CARE SCREENING TOOL     IDENTIFICATION  Patient Name: Gregory [name] Birthdate: Feb 08, 1941 Sex: male Admission Date (Current Location): 06/21/2021  [facility] and [place] Number:  Engineering geologist and Address:  [facility], [address], [place], [place] [address]      Provider Number: [id number]  Attending Physician Name and Address:  Loletha Grayer, MD  Relative Name and Phone Number:  Lovel, Hee (Spouse)   [phone] (Mobile)    Current Level of Care: Hospital Recommended Level of Care: [place] Prior Approval Number:    Date Approved/Denied:   PASRR Number: [id number]  Discharge Plan:      Current Diagnoses: Patient Active Problem List   Diagnosis Date Noted   Acute metabolic encephalopathy [id number]   Congestion of upper airway 06/23/2021   Pressure injury of skin 06/22/2021   Closed left hip fracture ([place]) 06/21/2021   Hypothyroidism 06/21/2021   Long term (current) use of anticoagulants 06/21/2021   Urinary frequency 12/23/2018   Nocturia 12/23/2018   Urge incontinence 05/13/2017   Carotid stenosis, asymptomatic, bilateral 12/12/2016   Gait disturbance, post-stroke 10/09/2016   Urinary retention 09/11/2016   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/16/2016   Dysphagia, post-stroke    Parkinson's disease ([place])    Acute postoperative anemia due to expected blood loss 06/20/2016   Lumbar transverse process fracture ([place]) 06/20/2016   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6 oz (77.1 kg) Height:     BEHAVIORAL SYMPTOMS/MOOD NEUROLOGICAL BOWEL NUTRITION STATUS        Diet (dys 2; nectar thick)  AMBULATORY STATUS COMMUNICATION OF NEEDS Skin   Total Care Verbally Surgical wounds (incision L leg, L hip; stage 1 coccyx)                       Personal Care Assistance Level of Assistance  Bathing, Feeding, Dressing Bathing Assistance: Maximum assistance Feeding assistance: Maximum assistance Dressing Assistance: Maximum assistance     Functional Limitations Info             SPECIAL CARE FACTORS FREQUENCY  PT (By licensed PT), OT (By licensed OT)     PT Frequency: 5 times per week OT Frequency: 5 times per week            Contractures      Additional Factors Info  Code Status, Allergies Code Status Info: full Allergies Info: nka           Current Medications (06/25/2021):  This is the current hospital active medication list Current Facility-Administered Medications  Medication Dose Route Frequency Provider Last Rate Last Admin   acetaminophen (TYLENOL) tablet 650 mg  650 mg Oral Q6H PRN Wieting, Darreon, MD       alum & mag hydroxide-simeth (MAALOX/MYLANTA) 200-200-20 MG/5ML suspension 30 mL  30 mL Oral Q4H  PRN Kennedy Bucker, MD       apixaban Everlene Balls) tablet 5 mg  5 mg Oral BID Kennedy Bucker, MD   5 mg at 06/25/21 1026   atorvastatin (LIPITOR) tablet 20 mg  20 mg Oral Weekly Kennedy Bucker, MD       bisacodyl (DULCOLAX) EC tablet 5 mg  5 mg Oral Daily PRN Kennedy Bucker, MD       bisacodyl (DULCOLAX) suppository 10 mg  10 mg Rectal Daily PRN Kennedy Bucker, MD       carbidopa-levodopa (SINEMET CR) 50-200 MG per tablet controlled release 1 tablet  1 tablet Oral 5 X Daily Kennedy Bucker, MD   1 tablet at 06/25/21 1026   Chlorhexidine Gluconate  Cloth 2 % PADS 6 each  6 each Topical Q0600 Kennedy Bucker, MD   6 each at 06/25/21 8119   docusate sodium (COLACE) capsule 100 mg  100 mg Oral BID Kennedy Bucker, MD   100 mg at 06/25/21 1026   ferrous fumarate-b12-vitamic C-folic acid (TRINSICON / FOLTRIN) capsule 1 capsule  1 capsule Oral BID PC Kennedy Bucker, MD   1 capsule at 06/25/21 1026   HYDROcodone-acetaminophen (NORCO/VICODIN) 5-325 MG per tablet 1 tablet  1 tablet Oral Q6H PRN Alford Highland, MD       ipratropium-albuterol (DUONEB) 0.5-2.5 (3) MG/3ML nebulizer solution 3 mL  3 mL Nebulization Q6H Kennedy Bucker, MD   3 mL at 06/25/21 0749   levothyroxine (SYNTHROID) tablet 50 mcg  50 mcg Oral Daily Kennedy Bucker, MD   50 mcg at 06/24/21 0636   magnesium hydroxide (MILK OF MAGNESIA) suspension 30 mL  30 mL Oral Daily PRN Kennedy Bucker, MD       menthol-cetylpyridinium (CEPACOL) lozenge 3 mg  1 lozenge Oral PRN Kennedy Bucker, MD       Or   phenol (CHLORASEPTIC) mouth spray 1 spray  1 spray Mouth/Throat PRN Kennedy Bucker, MD       methocarbamol (ROBAXIN) tablet 500 mg  500 mg Oral Q6H PRN Kennedy Bucker, MD       Or   methocarbamol (ROBAXIN) 500 mg in dextrose 5 % 50 mL IVPB  500 mg Intravenous Q6H PRN Kennedy Bucker, MD       metoCLOPramide (REGLAN) tablet 5-10 mg  5-10 mg Oral Q8H PRN Kennedy Bucker, MD       Or   metoCLOPramide (REGLAN) injection 5-10 mg  5-10 mg Intravenous Q8H PRN Kennedy Bucker, MD       mupirocin ointment (BACTROBAN) 2 % 1 application  1 application Nasal BID Kennedy Bucker, MD   1 application at 06/25/21 1025   ondansetron (ZOFRAN) tablet 4 mg  4 mg Oral Q6H PRN Kennedy Bucker, MD       Or   ondansetron [facility]) injection 4 mg  4 mg Intravenous Q6H PRN Kennedy Bucker, MD       pantoprazole (PROTONIX) injection 40 mg  40 mg Intravenous Q24H Kennedy Bucker, MD   40 mg at 06/24/21 2309   polyethylene glycol (MIRALAX / GLYCOLAX) packet 17 g  17 g Oral Daily PRN Kennedy Bucker, MD       tamsulosin [facility]) capsule 0.4 mg  0.4  mg Oral Daily Kennedy Bucker, MD   0.4 mg at 06/24/21 1478     Discharge Medications: Please see discharge summary for a list of discharge medications.  Relevant Imaging Results:  Relevant Lab Results:   Additional Information SS #: [id number]  Kaylynn Chamblin E Vijay Durflinger, LCSW

## 2021-06-25 NOTE — Plan of Care (Signed)

## 2021-06-25 NOTE — Progress Notes (Signed)
Physical Therapy Treatment Patient Details Name: Gregory [name] MRN: [id number] DOB: [birth year] Today's Date: 06/25/2021   History of Present Illness Pt is an 81 y/o M admitted on 06/21/21 with c/o falling & landing on L hip. Pt found to have L hip intertrochanteric fx & underwent IM nail. PMH: AAA, a-fib, heart disease, bilateral carotid artery stenosis, CAD, dementia, HLD, parkinsonism    PT Comments    Pt in bed with condiment dish trying to eat packets with fork.  Removed from pt.  He is too far down in bed to safely eat so he is transitioned to sitting EOB with mod/max a x 1 and max cues to assist.  He is generally steady in sitting with occasional cues to correct posture.  Attempted to get pt to lateral scoot in sitting towards [facility] but he is unable despite assist and cues.  Attempted to stand with +1 assist (unable to coordinate care for +2 assist) but was unable to lift hips up off bed to reposition.  He does sit EOB for about 15 minutes.  Participated in exercises as described below.  Returned to supine with max a x 1 and max a x 1 to reposition in bed.  Pt is sat in upright position per feeding protocol and tray is set up.  Nurse tech in room once set up to monitor feeding.     Recommendations for follow up therapy are one component of a multi-disciplinary discharge planning process, led by the attending physician.  Recommendations may be updated based on patient status, additional functional criteria and insurance authorization.  Follow Up Recommendations  Skilled nursing-short term rehab (<3 hours/day)     Assistance Recommended at Discharge Frequent or constant Supervision/Assistance  Patient can return home with the following Two people to help with walking and/or transfers;Two people to help with bathing/dressing/bathroom;Direct supervision/assist for medications management;Help with stairs or ramp for entrance;Assistance with feeding;Assist for transportation;Direct  supervision/assist for financial management;Assistance with cooking/housework   Equipment Recommendations       Recommendations for Other Services       Precautions / Restrictions Precautions Precautions: Fall Restrictions Weight Bearing Restrictions: Yes LLE Weight Bearing: Weight bearing as tolerated     Mobility  Bed Mobility Overal bed mobility: Needs Assistance Bed Mobility: Supine to Sit, Sit to Supine     Supine to sit: Mod assist, Max assist Sit to supine: Max assist   General bed mobility comments: attemtps to assist but is quite challenging for pt    Transfers Overall transfer level: Needs assistance Equipment used: None   Sit to Stand: Total assist           General transfer comment: uanble to stand or lateral scoot despite max +1    Ambulation/Gait               General Gait Details: unsafe   Stairs             Wheelchair Mobility    Modified Rankin (Stroke Patients Only)       Balance Overall balance assessment: Needs assistance Sitting-balance support: Feet supported, Bilateral upper extremity supported Sitting balance-Leahy Scale: Fair                                      Cognition Arousal/Alertness: Awake/alert Behavior During Therapy: WFL for tasks assessed/performed Overall Cognitive Status: History of cognitive impairments - at baseline  Exercises Other Exercises Other Exercises: supine and seated AAROM    General Comments        Pertinent Vitals/Pain Pain Assessment Pain Assessment: Faces Faces Pain Scale: Hurts little more Pain Location: L hip with mobility and PT performing ROM Pain Intervention(s): Limited activity within patient's tolerance, Monitored during session, Repositioned    Home Living                          Prior Function            PT Goals (current goals can now be found in the care plan section)  Progress towards PT goals: Progressing toward goals    Frequency    BID      PT Plan Current plan remains appropriate    Co-evaluation              AM-PAC PT "6 Clicks" Mobility   Outcome Measure  Help needed turning from your back to your side while in a flat bed without using bedrails?: A Lot Help needed moving from lying on your back to sitting on the side of a flat bed without using bedrails?: A Lot Help needed moving to and from a bed to a chair (including a wheelchair)?: Total Help needed standing up from a chair using your arms (e.g., wheelchair or bedside chair)?: Total Help needed to walk in hospital room?: Total Help needed climbing 3-5 steps with a railing? : Total 6 Click Score: 8    End of Session Equipment Utilized During Treatment: Gait belt Activity Tolerance: Patient tolerated treatment well Patient left: in bed;with call bell/phone within reach;with bed alarm set;with SCD's reapplied;with nursing/sitter in room Nurse Communication: Mobility status PT Visit Diagnosis: Unsteadiness on feet (R26.81);Muscle weakness (generalized) (M62.81);Difficulty in walking, not elsewhere classified (R26.2)     Time: 3662-9476 PT Time Calculation (min) (ACUTE ONLY): 31 min  Charges:  $Therapeutic Exercise: 8-22 mins $Therapeutic Activity: 8-22 mins                    Danielle Dess, PTA 06/25/21, 9:47 AM

## 2021-06-25 NOTE — Progress Notes (Signed)
°  Progress Note   Patient: Gregory [name] BFX:[id number] DOB: [birth year] DOA: 06/21/2021     4 DOS: the patient was seen and examined on 06/25/2021   Brief hospital course: No notes on file  Assessment and Plan: * Closed left hip fracture (HCC)- (present on admission) Status post intramedullary nail placement by Dr. Rosita Kea on 06/23/2021.  Pain control.  Continue working with physical therapy.  Physical therapy recommending rehab.  Discontinue morphine.  Try to convert to Tylenol as quickly as possible.   Acute metabolic encephalopathy The patient does have some confusion and thought he was in [place] yesterday where they got him up.  I reoriented him and he was able to have a good conversation with me.  Discontinue morphine and try to convert to Tylenol as quickly as possible.  Congestion of upper airway Much improved with nebulizer treatment.  Patient placed on dysphagia 2 diet with nectar thickened liquids.  Atrial fibrillation (HCC)-resolved as of 06/22/2021, (present on admission) Placed back on Eliquis.  Parkinson's disease (HCC)- (present on admission) Continue Sinemet  Hyperlipidemia- (present on admission) Continue atorvastatin  Hypothyroidism- (present on admission) Continue levothyroxine  Pressure injury of skin- (present on admission) As per nursing staff stage I decubiti on buttock area.  Acute postoperative anemia due to expected blood loss Hemoglobin came down to 8.2.  Initial hemoglobin at 11.5.  Continue to watch closely with being back on Eliquis.        Subjective: Patient feels okay.  Offers no complaints.  Did not state that he was in [place] yesterday.  States morphine knocked him for a loop.  Physical Exam: Vitals:   06/24/21 2013 06/25/21 0233 06/25/21 0628 06/25/21 0813  BP: (!) 103/47  (!) 97/57 (!) 102/59  Pulse: 99  71 80  Resp: 18  16 17   Temp: 98.3 F (36.8 C)  98.5 F (36.9 C) 98.4 F (36.9 C)  TempSrc:      SpO2: 100% 94% (!) 87%  100%  Weight:       Physical Exam HENT:     Head: Normocephalic.     Mouth/Throat:     Pharynx: No oropharyngeal exudate.  Eyes:     General: Lids are normal.     Conjunctiva/sclera: Conjunctivae normal.  Cardiovascular:     Rate and Rhythm: Normal rate and regular rhythm.     Heart sounds: Normal heart sounds, S1 normal and S2 normal.  Pulmonary:     Breath sounds: Normal breath sounds. No transmitted upper airway sounds. No decreased breath sounds, wheezing, rhonchi or rales.  Abdominal:     Palpations: Abdomen is soft.     Tenderness: There is no abdominal tenderness.  Musculoskeletal:     Right ankle: No swelling.     Left ankle: No swelling.  Skin:    General: Skin is warm.     Comments: As per nursing staff stage I decubiti sacral area  Neurological:     Mental Status: He is alert.     Comments: Told me he was in [place] yesterday.     Data Reviewed: Hemoglobin dropped down to 8.2  Family Communication: Spoke with patient's wife at the bedside  Disposition: Status is: Inpatient Remains inpatient appropriate because: Postoperative day 2 hip repair.  Planned Discharge Destination: Rehab  Author: , MD 06/25/2021 11:10 AM  For on call review [email or web address].

## 2021-06-26 ENCOUNTER — Encounter: Payer: Self-pay | Admitting: Orthopedic Surgery

## 2021-06-26 LAB — CBC
HCT: 26.8 % — ABNORMAL LOW (ref 39.0–52.0)
Hemoglobin: 8.4 g/dL — ABNORMAL LOW (ref 13.0–17.0)
MCH: 29 pg (ref 26.0–34.0)
MCHC: 31.3 g/dL (ref 30.0–36.0)
MCV: 92.4 fL (ref 80.0–100.0)
Platelets: 222 10*3/uL (ref 150–400)
RBC: 2.9 MIL/uL — ABNORMAL LOW (ref 4.22–5.81)
RDW: 14.7 % (ref 11.5–15.5)
WBC: 5.9 10*3/uL (ref 4.0–10.5)
nRBC: 0 % (ref 0.0–0.2)

## 2021-06-26 LAB — TYPE AND SCREEN
ABO/RH(D): O POS
Antibody Screen: NEGATIVE

## 2021-06-26 LAB — RESP PANEL BY RT-PCR (FLU A&B, COVID) ARPGX2
Influenza A by PCR: NEGATIVE
Influenza B by PCR: NEGATIVE
SARS Coronavirus 2 by RT PCR: NEGATIVE

## 2021-06-26 MED ORDER — HYDROCODONE-ACETAMINOPHEN 5-325 MG PO TABS: 1.0000 | ORAL_TABLET | Freq: Four times a day (QID) | ORAL | 0 refills | Status: DC | PRN

## 2021-06-26 NOTE — TOC Progression Note (Signed)
Transition of Care [facility]) - Progression Note    Patient Details  Name: Gregory [name] MRN: [id number] Date of Birth: 1940-09-25  Transition of Care [facility]) CM/SW Contact  Marlowe Sax, RN Phone Number: 06/26/2021, 1:16 PM  Clinical Narrative:   Spoke with [name] and the patient she stated that [facility] and [employer] are not ok, she asked that I reach out to [place] and other areas, Bedsearch sent to out areas    Expected Discharge Plan: Skilled Nursing Facility Barriers to Discharge: Continued Medical Work up  Expected Discharge Plan and Services Expected Discharge Plan: Skilled Nursing Facility In-house Referral: Clinical Social Work     Living arrangements for the past 2 months: Single Family Home                                       Social Determinants of Health (SDOH) Interventions    Readmission Risk Interventions No flowsheet data found.

## 2021-06-26 NOTE — Progress Notes (Signed)
Physical Therapy Treatment Patient Details Name: Gregory [name] MRN: [id number] DOB: 07-Nov-1940 Today's Date: 06/26/2021   History of Present Illness Pt is an 81 y/o M admitted on 06/21/21 with c/o falling & landing on L hip. Pt found to have L hip intertrochanteric fx & underwent IM nail. PMH: AAA, a-fib, heart disease, bilateral carotid artery stenosis, CAD, dementia, HLD, parkinsonism    PT Comments    Pt up to chair with nursing staff prior to arrival.  Stood x 2 from chairside with mod a x 2 to stand and heavy mod a x 2 to remains standing with increased L lateral lean with fatigue and attempts to lift L LE.  He does stand for 2 trails but is unable to progress gait.  Seated A/AAROM 2 x 10.     Recommendations for follow up therapy are one component of a multi-disciplinary discharge planning process, led by the attending physician.  Recommendations may be updated based on patient status, additional functional criteria and insurance authorization.  Follow Up Recommendations  Skilled nursing-short term rehab (<3 hours/day)     Assistance Recommended at Discharge Frequent or constant Supervision/Assistance  Patient can return home with the following Two people to help with walking and/or transfers;Two people to help with bathing/dressing/bathroom;Direct supervision/assist for medications management;Help with stairs or ramp for entrance;Assistance with feeding;Assist for transportation;Direct supervision/assist for financial management;Assistance with cooking/housework   Equipment Recommendations  None recommended by PT    Recommendations for Other Services       Precautions / Restrictions Precautions Precautions: Fall Restrictions Weight Bearing Restrictions: Yes LLE Weight Bearing: Weight bearing as tolerated     Mobility  Bed Mobility               General bed mobility comments: in recliner before and after session.    Transfers Overall transfer level: Needs  assistance Equipment used: Rolling walker (2 wheels) Transfers: Sit to/from Stand Sit to Stand: Mod assist, +2 physical assistance                Ambulation/Gait               General Gait Details: unsafe   Stairs             Wheelchair Mobility    Modified Rankin (Stroke Patients Only)       Balance Overall balance assessment: Needs assistance Sitting-balance support: Feet supported, Bilateral upper extremity supported Sitting balance-Leahy Scale: Fair     Standing balance support: Bilateral upper extremity supported, During functional activity Standing balance-Leahy Scale: Poor Standing balance comment: L lateral lean and heavy assist to maintian somewhat of a midline stance.                            Cognition Arousal/Alertness: Awake/alert Behavior During Therapy: WFL for tasks assessed/performed Overall Cognitive Status: History of cognitive impairments - at baseline                                          Exercises Other Exercises Other Exercises: seated arom 2 x 10,  standing L hip/knee flexion 4  x 5 with 1 seated rest.    General Comments        Pertinent Vitals/Pain Pain Assessment Pain Assessment: Faces Faces Pain Scale: Hurts little more Pain Location: L hip with mobility and PT performing ROM  "  Pinches" Pain Descriptors / Indicators: Discomfort, Grimacing Pain Intervention(s): Limited activity within patient's tolerance, Monitored during session, Repositioned    Home Living                          Prior Function            PT Goals (current goals can now be found in the care plan section) Progress towards PT goals: Progressing toward goals    Frequency    BID      PT Plan Current plan remains appropriate    Co-evaluation              AM-PAC PT "6 Clicks" Mobility   Outcome Measure  Help needed turning from your back to your side while in a flat bed without using  bedrails?: A Lot Help needed moving from lying on your back to sitting on the side of a flat bed without using bedrails?: A Lot Help needed moving to and from a bed to a chair (including a wheelchair)?: Total Help needed standing up from a chair using your arms (e.g., wheelchair or bedside chair)?: A Lot Help needed to walk in hospital room?: Total Help needed climbing 3-5 steps with a railing? : Total 6 Click Score: 9    End of Session Equipment Utilized During Treatment: Gait belt Activity Tolerance: Patient tolerated treatment well Patient left: in chair;with call bell/phone within reach;with chair alarm set Nurse Communication: Mobility status PT Visit Diagnosis: Unsteadiness on feet (R26.81);Muscle weakness (generalized) (M62.81);Difficulty in walking, not elsewhere classified (R26.2)     Time: 7672-0947 PT Time Calculation (min) (ACUTE ONLY): 18 min  Charges:  $Therapeutic Exercise: 8-22 mins          Danielle Dess, PTA 06/26/21, 10:38 AM

## 2021-06-26 NOTE — TOC Progression Note (Addendum)
Transition of Care [facility]) - Progression Note    Patient Details  Name: Gregory [name] MRN: [id number] Date of Birth: 30-Sep-1940  Transition of Care [facility]) CM/SW [place], RN Phone Number: 06/26/2021, 3:15 PM  Clinical Narrative:     Spoke to the patient's wife Gregory [name], reviewed bed offers, they chose [facility], I notified leslie at [employer], Uploaded information to start the ins process,  Ref number [id number] Expected Discharge Plan: [place] Barriers to Discharge: Continued Medical Work up  Expected Discharge Plan and Services Expected Discharge Plan: [place] In-house Referral: Clinical Social Work     Living arrangements for the past 2 months: Single Family Home                                       Social Determinants of Health (SDOH) Interventions    Readmission Risk Interventions No flowsheet data found.

## 2021-06-26 NOTE — Progress Notes (Signed)
°  Progress Note   Patient: Gregory [name] [id number] DOB: 01-06-1941 DOA: 06/21/2021     5 DOS: the patient was seen and examined on 06/26/2021     Assessment and Plan: * Closed left hip fracture ([place])- (present on admission) Status post intramedullary nail placement by Dr. Rudene Christians on 06/23/2021.  Pain control.  Continue working with physical therapy.  Physical therapy recommending rehab.  Try to convert to Tylenol as quickly as possible   Acute metabolic encephalopathy Slightly improved today.  Answering questions better.  Continue to not give morphine.  Try to convert to Tylenol as quickly as possible.  Congestion of upper airway Improved with nebulizer treatment.  Atrial fibrillation (HCC)-resolved as of 06/22/2021, (present on admission) Placed back on Eliquis.  Parkinson's disease ([place])- (present on admission) Continue Sinemet  Hyperlipidemia- (present on admission) Continue atorvastatin  Hypothyroidism- (present on admission) Continue levothyroxine  Pressure injury of skin- (present on admission) As per nursing staff stage I decubiti on buttock area.  Acute postoperative anemia due to expected blood loss Hemoglobin came down to 8.2.  Today's hemoglobin 8.4.  Initial hemoglobin at 11.5.  Continue to watch closely with being back on Eliquis.        Subjective: Patient feeling okay.  Physical Exam: Vitals:   06/26/21 0353 06/26/21 0752 06/26/21 1120 06/26/21 1522  BP: 119/62 (!) 121/53 (!) 91/51 (!) 100/51  Pulse: 91 70 81 (!) 57  Resp: 16 16 15 16   Temp: 97.9 F (36.6 C) 97.8 F (36.6 C) 98 F (36.7 C) 97.8 F (36.6 C)  TempSrc:      SpO2: 94% 94% 98% 95%  Weight:      Height:       Physical Exam HENT:     Head: Normocephalic.     Mouth/Throat:     Pharynx: No oropharyngeal exudate.  Eyes:     General: Lids are normal.     Conjunctiva/sclera: Conjunctivae normal.  Cardiovascular:     Rate and Rhythm: Normal rate and regular rhythm.     Heart  sounds: Normal heart sounds, S1 normal and S2 normal.  Pulmonary:     Breath sounds: Normal breath sounds. No transmitted upper airway sounds. No decreased breath sounds, wheezing, rhonchi or rales.  Abdominal:     Palpations: Abdomen is soft.     Tenderness: There is no abdominal tenderness.  Musculoskeletal:     Right ankle: No swelling.     Left ankle: No swelling.  Skin:    General: Skin is warm.     Comments: As per nursing staff stage I decubiti sacral area  Neurological:     Mental Status: He is alert.     Comments: In his questions better today     Data Reviewed: Hemoglobin stabilized at 8.4  Family Communication: Spoke with the patient's wife  Disposition: Status is: Inpatient Remains inpatient appropriate because: Awaiting to go at the rehab and awaiting insurance authorization.  Planned Discharge Destination: Rehab  Author: Loletha Grayer, MD 06/26/2021 4:58 PM  For on call review [email or web address].

## 2021-06-26 NOTE — Progress Notes (Signed)
Physical Therapy Treatment Patient Details Name: Gregory [name] MRN: [id number] DOB: 1941-03-10 Today's Date: 06/26/2021   History of Present Illness Pt is an 81 y/o M admitted on 06/21/21 with c/o falling & landing on L hip. Pt found to have L hip intertrochanteric fx & underwent IM nail. PMH: AAA, a-fib, heart disease, bilateral carotid artery stenosis, CAD, dementia, HLD, parkinsonism    PT Comments    Pt returned to bed with max a +2.  Stood initially with walker but he is unable to move feet so he sits and walker is removed for stnd pivit +2 and for pericare due to small BM smear in chair.    Long discussion with wife regarding discharge plan.  She does not like choices for SNF that bed offers have been extended.  We did discuss what she would need for home.  He has a hospital bed and commode already.  Would need a Hoyer type lift for OOB, wheelchair (I am unsure if she has one already) along with HHPT and +2 assist for caregivers for mobility.  She is aware pt will struggle to gait PLOF and taht it will take him a long time to do so if he does.  Son is available to help some.  Encouraged her to have a discussion with family on their ability to help her if she does not accept SNF offers.  TOC is looking outside of [name].  Encouragement given.     Recommendations for follow up therapy are one component of a multi-disciplinary discharge planning process, led by the attending physician.  Recommendations may be updated based on patient status, additional functional criteria and insurance authorization.  Follow Up Recommendations  Skilled nursing-short term rehab (<3 hours/day)     Assistance Recommended at Discharge Frequent or constant Supervision/Assistance  Patient can return home with the following Two people to help with walking and/or transfers;Two people to help with bathing/dressing/bathroom;Direct supervision/assist for medications management;Help with stairs or ramp for  entrance;Assistance with feeding;Assist for transportation;Direct supervision/assist for financial management;Assistance with cooking/housework   Equipment Recommendations  Other (comment)    Recommendations for Other Services       Precautions / Restrictions Precautions Precautions: Fall Restrictions Weight Bearing Restrictions: Yes LLE Weight Bearing: Weight bearing as tolerated     Mobility  Bed Mobility Overal bed mobility: Needs Assistance Bed Mobility: Sit to Supine       Sit to supine: +2 for physical assistance, Min assist   General bed mobility comments: initiates return to supine but ultimetly needs assist for guidance and to bring LE's onto bed.    Transfers Overall transfer level: Needs assistance Equipment used: Rolling walker (2 wheels), None Transfers: Bed to chair/wheelchair/BSC Sit to Stand: Mod assist, Max assist, +2 physical assistance           General transfer comment: stood with walker but unable to move feet to pivot with walker.  sat and walker removed for stnd pivot +2 and able to transition to bed.    Ambulation/Gait               General Gait Details: unsafe   Stairs             Wheelchair Mobility    Modified Rankin (Stroke Patients Only)       Balance Overall balance assessment: Needs assistance Sitting-balance support: Feet supported, Bilateral upper extremity supported Sitting balance-Leahy Scale: Fair     Standing balance support: Bilateral upper extremity supported, During functional activity Standing  balance-Leahy Scale: Poor Standing balance comment: L lateral lean and heavy assist to maintian somewhat of a midline stance.                            Cognition Arousal/Alertness: Awake/alert Behavior During Therapy: WFL for tasks assessed/performed Overall Cognitive Status: History of cognitive impairments - at baseline                                          Exercises  Other Exercises Other Exercises: seated arom 2 x 10,  standing L hip/knee flexion 4  x 5 with 1 seated rest.    General Comments        Pertinent Vitals/Pain Pain Assessment Pain Assessment: Faces Faces Pain Scale: Hurts a little bit Pain Location: Seems generally comfortable Pain Descriptors / Indicators: Discomfort, Grimacing Pain Intervention(s): Limited activity within patient's tolerance, Monitored during session, Repositioned    Home Living                          Prior Function            PT Goals (current goals can now be found in the care plan section) Progress towards PT goals: Progressing toward goals    Frequency    BID      PT Plan Current plan remains appropriate    Co-evaluation              AM-PAC PT "6 Clicks" Mobility   Outcome Measure  Help needed turning from your back to your side while in a flat bed without using bedrails?: A Lot Help needed moving from lying on your back to sitting on the side of a flat bed without using bedrails?: A Lot Help needed moving to and from a bed to a chair (including a wheelchair)?: Total Help needed standing up from a chair using your arms (e.g., wheelchair or bedside chair)?: A Lot Help needed to walk in hospital room?: Total Help needed climbing 3-5 steps with a railing? : Total 6 Click Score: 9    End of Session Equipment Utilized During Treatment: Gait belt Activity Tolerance: Patient tolerated treatment well Patient left: in bed;with call bell/phone within reach;with bed alarm set;with family/visitor present Nurse Communication: Mobility status PT Visit Diagnosis: Unsteadiness on feet (R26.81);Muscle weakness (generalized) (M62.81);Difficulty in walking, not elsewhere classified (R26.2)     Time: 0136-0201 PT Time Calculation (min) (ACUTE ONLY): 25 min  Charges:  $Therapeutic Exercise: 8-22 mins $Therapeutic Activity: 23-37 mins                    Danielle Dess, PTA 06/26/21,  2:12 PM

## 2021-06-26 NOTE — Progress Notes (Signed)
Occupational Therapy Treatment Patient Details Name: Gregory [name] MRN: [id number] DOB: [birth year] Today's Date: 06/26/2021   History of present illness Pt is an 81 y/o M admitted on 06/21/21 with c/o falling & landing on L hip. Pt found to have L hip intertrochanteric fx & underwent IM nail. PMH: AAA, a-fib, heart disease, bilateral carotid artery stenosis, CAD, dementia, HLD, parkinsonism   OT comments  Pt awake and cooperative with OT.  Able to transition pt up to EOB in prep for sit to stand.  1 person total assist to stand pivot from elevated bed to recliner.  Good WB to BLEs in standing with pt able to tolerate standing giving OT a "bear hug" x30 seconds before sitting in recliner with minimal reports of pain.  Participated in [place] once seated in chair with good tolerance.  Assisted pt to use flutter valve x20 reps on level 1.  Pt had breakfast tray with OT providing set up including opening packages, stirring condiments into grits.  Pt required assist with set up of food onto utensil but was then able to bring spoon to mouth with minimal spilling, intermittent cues to level spoon or drink, and cues for attention to task to initiate additional bites.  Left pt in room with RN present once pt felt he had had enough to eat.  Chair alarm set, call lights and phone within reach and all needs met.  Elevated feet in recliner as RN reported that pt tends to slide from chair.  Pt will continue to benefit from acute OT for ADL/functional transfer training with continued recommendation for SNF at d/c.    Recommendations for follow up therapy are one component of a multi-disciplinary discharge planning process, led by the attending physician.  Recommendations may be updated based on patient status, additional functional criteria and insurance authorization.    Follow Up Recommendations  Skilled nursing-short term rehab (<3 hours/day)    Assistance Recommended at Discharge Frequent or constant  Supervision/Assistance  Patient can return home with the following  Two people to help with walking and/or transfers;A lot of help with bathing/dressing/bathroom;Assistance with cooking/housework;Help with stairs or ramp for entrance;Assist for transportation   Equipment Recommendations  Other (comment) (defer to next venue of care)    Recommendations for Other Services      Precautions / Restrictions Precautions Precautions: Fall Restrictions Weight Bearing Restrictions: Yes LLE Weight Bearing: Weight bearing as tolerated Other Position/Activity Restrictions: Pt tends to slide in chair (OT left pt with feet elevated in recliner and chair alarm on)       Mobility Bed Mobility Overal bed mobility: Needs Assistance       Supine to sit: Max assist     General bed mobility comments: Pt assisted with shifting legs toward EOB, used handrail to stabilize while transitioning trunk from supine to sit. Patient Response: Cooperative  Transfers Overall transfer level: Needs assistance Equipment used: None (gait belt and pt hugged OT to transition from sit<>stand and pivot to chair) Transfers: Sit to/from Stand, Bed to chair/wheelchair/BSC Sit to Stand: Total assist (1 person) Stand pivot transfers: Total assist         General transfer comment: 1 person total assist to stand pivot from elevated bed to recliner.  Good WB to BLEs in standing with pt able to tolerate standing giving OT a "bear hug" x30 seconds before sitting in recliner.     Balance Overall balance assessment: Needs assistance Sitting-balance support: Feet supported, Bilateral upper extremity supported Sitting balance-Leahy Scale:  Fair Sitting balance - Comments: initial retropulsion but corrected after several min of cueing to reach forward   Standing balance support: Bilateral upper extremity supported, During functional activity Standing balance-Leahy Scale: Poor                             ADL  either performed or assessed with clinical judgement   ADL Overall ADL's : Needs assistance/impaired Eating/Feeding: Moderate assistance Eating/Feeding Details (indicate cue type and reason): OT assisted with food set up from breakfast tray, and set up of food on spoon; pt brought food to mouth with minimal spilling.                                 Functional mobility during ADLs: Total assistance General ADL Comments: 1 person total assist to stand pivot from elevated bed to recliner.  Good WB to BLEs in standing with pt able to tolerate standing giving OT a "bear hug" x30 seconds before sitting in recliner.    Extremity/Trunk Assessment Upper Extremity Assessment Upper Extremity Assessment: Generalized weakness   Lower Extremity Assessment Lower Extremity Assessment: Generalized weakness   Cervical / Trunk Assessment Cervical / Trunk Assessment: Kyphotic                      Cognition Arousal/Alertness: Awake/alert Behavior During Therapy: WFL for tasks assessed/performed Overall Cognitive Status: History of cognitive impairments - at baseline                                          Exercises Other Exercises Other Exercises: in sitting performed active assisted ankle pumps, knee extension, seated marching, bilat shoulder flexion, abd, elbow flex/ext, wrist flex/ext, digit flex/ext x10 each.  Active assisted needed for initiation/termination and quality of movement d/t cognitive impairment.                 Pertinent Vitals/ Pain       Pain Assessment Pain Assessment: Faces Faces Pain Scale: Hurts a little bit Breathing: normal Negative Vocalization: none Facial Expression: facial grimacing Body Language: tense, distressed pacing, fidgeting Consolability: no need to console PAINAD Score: 3 Pain Location: Pt points to the L hip Pain Descriptors / Indicators: Discomfort, Grimacing Pain Intervention(s): Limited activity within  patient's tolerance, Monitored during session, Repositioned                                                          Frequency  Min 3X/week        Progress Toward Goals  OT Goals(current goals can now be found in the care plan section)  Progress towards OT goals: Progressing toward goals  Acute Rehab OT Goals Patient Stated Goal: to go home OT Goal Formulation: With patient Time For Goal Achievement: 07/08/21 Potential to Achieve Goals: Good  Plan Discharge plan remains appropriate    Co-evaluation                 AM-PAC OT "6 Clicks" Daily Activity     Outcome Measure   Help from another person eating meals?: A Little Help from  another person taking care of personal grooming?: A Lot Help from another person toileting, which includes using toliet, bedpan, or urinal?: A Lot Help from another person bathing (including washing, rinsing, drying)?: A Lot Help from another person to put on and taking off regular upper body clothing?: A Lot Help from another person to put on and taking off regular lower body clothing?: Total 6 Click Score: 12    End of Session Equipment Utilized During Treatment: Gait belt  OT Visit Diagnosis: History of falling (Z91.81);Other abnormalities of gait and mobility (R26.89)   Activity Tolerance Patient tolerated treatment well   Patient Left in chair;with call bell/phone within reach;with chair alarm set;with nursing/sitter in room   Nurse Communication Mobility status        Time: 8185-6314 OT Time Calculation (min): 44 min  Charges: OT General Charges $OT Visit: 1 Visit OT Treatments $Self Care/Home Management : 23-37 mins $Therapeutic Exercise: 8-22 mins  Leta Speller, MS, OTR/L   Darleene Cleaver 06/26/2021, 3:08 PM
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
## 2021-06-27 MED ORDER — IPRATROPIUM-ALBUTEROL 0.5-2.5 (3) MG/3ML IN SOLN: 3.0000 mL | Freq: Three times a day (TID) | RESPIRATORY_TRACT | 0 refills | Status: DC

## 2021-06-27 MED ORDER — GLYCOPYRROLATE 1 MG PO TABS
1.0000 mg | ORAL_TABLET | Freq: Three times a day (TID) | ORAL | Status: DC
  Administered 2021-06-27: 1 mg via ORAL
  Filled 2021-06-27 (×2): qty 1

## 2021-06-27 MED ORDER — PANTOPRAZOLE SODIUM 40 MG PO TBEC: 40.0000 mg | DELAYED_RELEASE_TABLET | Freq: Every day | ORAL | 1 refills | Status: AC

## 2021-06-27 MED ORDER — GLYCOPYRROLATE 0.2 MG/ML IJ SOLN
0.4000 mg | Freq: Once | INTRAMUSCULAR | Status: AC
  Administered 2021-06-27: 0.4 mg via INTRAVENOUS
  Filled 2021-06-27: qty 2

## 2021-06-27 MED ORDER — FE FUMARATE-B12-VIT C-FA-IFC PO CAPS: 1.0000 | ORAL_CAPSULE | Freq: Two times a day (BID) | ORAL | 0 refills | Status: DC

## 2021-06-27 MED ORDER — ACETAMINOPHEN 325 MG PO TABS: 650.0000 mg | ORAL_TABLET | Freq: Four times a day (QID) | ORAL | Status: DC | PRN

## 2021-06-27 MED ORDER — IPRATROPIUM-ALBUTEROL 0.5-2.5 (3) MG/3ML IN SOLN
3.0000 mL | Freq: Three times a day (TID) | RESPIRATORY_TRACT | Status: DC
  Administered 2021-06-27 (×2): 3 mL via RESPIRATORY_TRACT
  Filled 2021-06-27 (×2): qty 3

## 2021-06-27 MED ORDER — MUPIROCIN 2 % EX OINT: 1.0000 "application " | TOPICAL_OINTMENT | Freq: Two times a day (BID) | CUTANEOUS | 0 refills | Status: DC

## 2021-06-27 MED ORDER — GLYCOPYRROLATE 1 MG PO TABS: 1.0000 mg | ORAL_TABLET | Freq: Three times a day (TID) | ORAL | 0 refills | Status: DC
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
## 2021-06-27 NOTE — Discharge Planning (Signed)
Attempted to call [facility] at [phone] in order to give report to nurse. I received a busy tone and will attempt to contact RN.   Patient will be receiving transport from EMS later this afternoon. Wife will be meeting patient at [employer] [employer].

## 2021-06-27 NOTE — Plan of Care (Signed)
Discharge order in. I am reviewing patient instructions with patient's wife, [name].

## 2021-06-27 NOTE — Discharge Summary (Signed)
Physician Discharge Summary   Patient: Gregory LoronRichard Rostron MRN: [id number] DOB: 09/22/1940  Admit date:     06/21/2021  Discharge date: 06/27/21  Discharge Physician: Alford Highlandichard Benancio Osmundson   PCP: Fatima SangerPrevost, Mary Ellen, FNP   Recommendations at discharge:   Follow-up team at rehab 1 day Follow-up orthopedic surgery  Discharge Diagnoses: Principal Problem:   Closed left hip fracture (HCC) Active Problems:   Acute metabolic encephalopathy   Congestion of upper airway   Coronary artery disease involving coronary bypass graft of native heart without angina pectoris   Parkinson's disease (HCC)   Hyperlipidemia   Hypothyroidism   Pressure injury of skin   Acute postoperative anemia due to expected blood loss  Resolved Problems:   Atrial fibrillation (HCC)   GERD (gastroesophageal reflux disease)   AAA (abdominal aortic aneurysm) without rupture   Fall at home, initial encounter   Acute delirium   Hospital Course: Patient was admitted to the hospital on 06/21/2021 for fall and found to have a left hip fracture.  We held Eliquis for 2 days.  Dr. Rosita KeaMenz did a left intramedullary nail placement on 06/23/2021.  Patient was restarted on Eliquis.  Physical therapy saw the patient is recommending rehab.  Try to convert over to Tylenol as quickly as possible.  During the hospital course the patient has had congestion in the upper airway.  Patient has needed suctioning and nebulizer treatments.  Continue chest PT.  [facility] able to do the nebulizer treatments.  We will also give glycopyrrolate to help dry up secretions.  Likely secondary to Parkinson's disease and swallowing issues.  Oxygen if needed for pulse ox less than 88%.  The patient is on dysphagia 2 diet with nectar thick liquids.  Assessment and Plan: * Closed left hip fracture (HCC)- (present on admission) Status post intramedullary nail placement by Dr. Rosita KeaMenz on 06/23/2021.  Pain control.  Continue working with physical therapy.  Physical  therapy recommending rehab.  Try to convert to Tylenol as quickly as possible   Acute metabolic encephalopathy Slightly improved today.  Answering questions better.  Continue to not give morphine.  Try to convert to Tylenol as quickly as possible.  Congestion of upper airway Improved with nebulizer treatment.  Atrial fibrillation (HCC)-resolved as of 06/22/2021, (present on admission) Placed back on Eliquis.  Parkinson's disease (HCC)- (present on admission) Continue Sinemet  Hyperlipidemia- (present on admission) Continue atorvastatin  Hypothyroidism- (present on admission) Continue levothyroxine  Pressure injury of skin- (present on admission) As per nursing staff stage I decubiti on buttock area.  Acute postoperative anemia due to expected blood loss Hemoglobin came down to 8.2.  Today's hemoglobin 8.4.  Initial hemoglobin at 11.5.  Continue to watch closely with being back on Eliquis.           Consultants: Orthopedic surgery Procedures performed: Left hip intramedullary nail Disposition: Rehab Diet recommendation:  Dysphagia 2 diet with nectar thick liquids  DISCHARGE MEDICATION: Allergies as of 06/27/2021   No Known Allergies      Medication List     STOP taking these medications    colestipol 1 g tablet Commonly known as: COLESTID       TAKE these medications    acetaminophen 325 MG tablet Commonly known as: TYLENOL Take 2 tablets (650 mg total) by mouth every 6 (six) hours as needed for mild pain, moderate pain, fever or headache.   apixaban 5 MG Tabs tablet Commonly known as: ELIQUIS Take 1 tablet (5 mg total) by mouth 2 (two)  times daily.   atorvastatin 20 MG tablet Commonly known as: LIPITOR Take 1 tablet (20 mg total) by mouth once a week.   carbidopa-levodopa 50-200 MG tablet Commonly known as: Sinemet CR Take 1 tablet by mouth 5 (five) times daily. Take at 9 AM, 12, 3 PM, 6 PM, and 9 PM. What changed: Another medication with the  same name was removed. Continue taking this medication, and follow the directions you see here.   ferrous fumarate-b12-vitamic C-folic acid capsule Commonly known as: TRINSICON / FOLTRIN Take 1 capsule by mouth 2 (two) times daily after a meal.   glycopyrrolate 1 MG tablet Commonly known as: ROBINUL Take 1 tablet (1 mg total) by mouth 3 (three) times daily.   HYDROcodone-acetaminophen 5-325 MG tablet Commonly known as: NORCO/VICODIN Take 1 tablet by mouth every 6 (six) hours as needed for severe pain.   ipratropium-albuterol 0.5-2.5 (3) MG/3ML Soln Commonly known as: DUONEB Take 3 mLs by nebulization 3 (three) times daily.   levothyroxine 50 MCG tablet Commonly known as: SYNTHROID Take 50 mcg by mouth daily.   mupirocin ointment 2 % Commonly known as: BACTROBAN Place 1 application into the nose 2 (two) times daily.   pantoprazole 40 MG tablet Commonly known as: Protonix Take 1 tablet (40 mg total) by mouth daily.   polyethylene glycol 17 g packet Commonly known as: MIRALAX / GLYCOLAX Take 17 g by mouth every evening.   psyllium 28 % packet Commonly known as: METAMUCIL SMOOTH TEXTURE Take 1 packet by mouth 2 (two) times daily.   tamsulosin 0.4 MG Caps capsule Commonly known as: FLOMAX TAKE 1 CAPSULE BY MOUTH ONCE DAILY AFTER SUPPER        Contact information for follow-up providers     Evon SlackGaines, Thomas C, PA-C Follow up in 2 week(s).   Specialties: Orthopedic Surgery, Emergency Medicine Contact information: [address] [place] [id number] [phone]              Contact information for after-discharge care     Destination     [facility] .   Service: Skilled Nursing Contact information: [address] [place] [id number] [phone]                     Discharge Exam: Ceasar MonsFiled Weights   06/21/21 1718 06/21/21 2105   Weight: 77.1 kg 77.1 kg   Physical Exam HENT:     Head: Normocephalic.     Mouth/Throat:     Pharynx: No oropharyngeal exudate.  Eyes:     General: Lids are normal.     Conjunctiva/sclera: Conjunctivae normal.  Cardiovascular:     Rate and Rhythm: Normal rate and regular rhythm.     Heart sounds: Normal heart sounds, S1 normal and S2 normal.  Pulmonary:     Breath sounds: Normal breath sounds. Transmitted upper airway sounds present. No decreased breath sounds, wheezing, rhonchi or rales.     Comments: Upper airway congestion better late morning than it was this morning. Abdominal:     Palpations: Abdomen is soft.     Tenderness: There is no abdominal tenderness.  Musculoskeletal:     Right ankle: No swelling.     Left ankle: No swelling.  Skin:    General: Skin is warm.     Comments: As per nursing staff stage I decubiti sacral area  Neurological:     Mental Status: He is alert.  Comments: In his questions better today     Condition at discharge: stable  The results of significant diagnostics from this hospitalization (including imaging, microbiology, ancillary and laboratory) are listed below for reference.   Imaging Studies: DG Chest 1 View  Result Date: 06/21/2021 CLINICAL DATA:  Tripped and fell. EXAM: CHEST  1 VIEW COMPARISON:  07/20/2016 FINDINGS: Enlarged cardiac silhouette with a mild increase in size. The aorta remains tortuous. The tracheostomy tube has been. Pleural and parenchymal scarring at the right lateral lung base. Small amount of pleural thickening at the left lateral lung base. Mild interstitial prominence without Kerley lines. Old, healed right rib fractures. No acute fracture or pneumothorax seen. IMPRESSION: 1. No acute abnormality. 2. Mildly progressive cardiomegaly. 3. Scarring at the lung bases. Electronically Signed   By: Beckie Salts M.D.   On: 06/21/2021 19:03   CT HEAD WO CONTRAST ( )  Result Date: 06/21/2021 CLINICAL DATA:  Head and neck  trauma.  Fall. EXAM: CT HEAD WITHOUT CONTRAST CT CERVICAL SPINE WITHOUT CONTRAST TECHNIQUE: Multidetector CT imaging of the head and cervical spine was performed following the standard protocol without intravenous contrast. Multiplanar CT image reconstructions of the cervical spine were also generated. RADIATION DOSE REDUCTION: This exam was performed according to the departmental dose-optimization program which includes automated exposure control, adjustment of the mA and/or kV according to patient size and/or use of iterative reconstruction technique. COMPARISON:  Head CTA 11/29/2016, head MRI 06/08/2016, and neck CTA 10/17/2016. FINDINGS: CT HEAD FINDINGS Brain: There is no evidence of an acute infarct, intracranial hemorrhage, mass, midline shift, or extra-axial fluid collection. Progressive enlargement of the lateral and third ventricles is favored to be secondary to moderate cerebral atrophy rather than hydrocephalus. 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 Hypodensities in the cerebral white matter bilaterally are nonspecific but compatible with mild chronic small vessel ischemic disease. Vascular: Calcified atherosclerosis at the skull base. No hyperdense vessel. Skull: No acute fracture or suspicious osseous lesion. Sinuses/Orbits: Visualized paranasal sinuses and right mastoid air cells are clear. Prior left canal wall up mastoidectomy. Bilateral cataract extraction. Other: None. CT CERVICAL SPINE FINDINGS Alignment: Mild left convex curvature of the cervical spine. No acute traumatic malalignment. Skull base and vertebrae: No acute fracture or suspicious osseous lesion. Soft tissues and spinal canal: No prevertebral fluid or swelling. No visible canal hematoma. Disc levels: Mild cervical spondylosis. Bridging anterior vertebral osteophytes at C7-T1 and T1-2. Upper chest: Clear lung apices. Other: Moderate calcific atherosclerosis at the left carotid bifurcation. Prior right carotid  endarterectomy. IMPRESSION: 1. No evidence of acute intracranial abnormality. 2. Moderate cerebral atrophy and mild chronic small vessel ischemic disease. 3. No evidence of acute fracture or traumatic malalignment in the cervical spine. Electronically Signed   By: Sebastian Ache M.D.   On: 06/21/2021 18:44   CT Cervical Spine Wo Contrast  Result Date: 06/21/2021 CLINICAL DATA:  Head and neck trauma.  Fall. EXAM: CT HEAD WITHOUT CONTRAST CT CERVICAL SPINE WITHOUT CONTRAST TECHNIQUE: Multidetector CT imaging of the head and cervical spine was performed following the standard protocol without intravenous contrast. Multiplanar CT image reconstructions of the cervical spine were also generated. RADIATION DOSE REDUCTION: This exam was performed according to the departmental dose-optimization program which includes automated exposure control, adjustment of the mA and/or kV according to patient size and/or use of iterative reconstruction technique. COMPARISON:  Head CTA 11/29/2016, head MRI 06/08/2016, and neck CTA 10/17/2016. FINDINGS: CT HEAD FINDINGS Brain: There is no evidence of an  acute infarct, intracranial hemorrhage, mass, midline shift, or extra-axial fluid collection. Progressive enlargement of the lateral and third ventricles is favored to be secondary to moderate cerebral atrophy rather than hydrocephalus. 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 Hypodensities in the cerebral white matter bilaterally are nonspecific but compatible with mild chronic small vessel ischemic disease. Vascular: Calcified atherosclerosis at the skull base. No hyperdense vessel. Skull: No acute fracture or suspicious osseous lesion. Sinuses/Orbits: Visualized paranasal sinuses and right mastoid air cells are clear. Prior left canal wall up mastoidectomy. Bilateral cataract extraction. Other: None. CT CERVICAL SPINE FINDINGS Alignment: Mild left convex curvature of the cervical spine. No acute traumatic  malalignment. Skull base and vertebrae: No acute fracture or suspicious osseous lesion. Soft tissues and spinal canal: No prevertebral fluid or swelling. No visible canal hematoma. Disc levels: Mild cervical spondylosis. Bridging anterior vertebral osteophytes at C7-T1 and T1-2. Upper chest: Clear lung apices. Other: Moderate calcific atherosclerosis at the left carotid bifurcation. Prior right carotid endarterectomy. IMPRESSION: 1. No evidence of acute intracranial abnormality. 2. Moderate cerebral atrophy and mild chronic small vessel ischemic disease. 3. No evidence of acute fracture or traumatic malalignment in the cervical spine. Electronically Signed   By: Sebastian Ache M.D.   On: 06/21/2021 18:44   DG Abd 2 Views  Result Date: 06/22/2021 CLINICAL DATA:  Abdominal distension.  Left hip fracture. EXAM: ABDOMEN - 2 VIEW COMPARISON:  Pelvis and left hip radiographs obtained yesterday. FINDINGS: Normal bowel gas pattern. Prominent stool in the right colon and rectum. Lumbar spine degenerative changes. Partially included recently demonstrated left intertrochanteric fracture. Sternal wires. IMPRESSION: 1. No acute abdominal abnormality. 2. Prominent stool in the rectum and right colon. 3. Previously described left intertrochanteric fracture. Electronically Signed   By: Beckie Salts M.D.   On: 06/22/2021 18:11   DG C-Arm 1-60 Min-No Report  Result Date: 06/24/2021 Fluoroscopy was utilized by the requesting physician.  No radiographic interpretation.   DG HIP UNILAT WITH PELVIS 1V LEFT  Result Date: 06/24/2021 CLINICAL DATA:  Fracture left femur EXAM: DG HIP (WITH OR WITHOUT PELVIS) 1V*L* COMPARISON:  06/21/2021 FINDINGS: Fluoroscopic images show internal fixation of intertrochanteric fracture of proximal left femur with intramedullary rod. Fluoroscopic time was 54 seconds. IMPRESSION: Fluoroscopic assistance was provided for internal fixation of intertrochanteric fracture of left femur with intramedullary  rod. Electronically Signed   By: Ernie Avena M.D.   On: 06/24/2021 08:38   DG Hip Unilat W or Wo Pelvis 2-3 Views Left  Result Date: 06/21/2021 CLINICAL DATA:  Tripped and fell, left thigh pain EXAM: DG HIP (WITH OR WITHOUT PELVIS) 2-3V LEFT COMPARISON:  None. FINDINGS: Frontal view of the pelvis as well as frontal and cross-table lateral views of the left hip are obtained. There is a comminuted intertrochanteric left hip fracture with impaction and varus angulation at the fracture site. No dislocation. No other acute bony abnormalities. Mild symmetrical bilateral hip osteoarthritis. IMPRESSION: 1. Comminuted intertrochanteric left hip fracture, with impaction and varus angulation at the fracture site. Electronically Signed   By: Sharlet Salina M.D.   On: 06/21/2021 19:02    Microbiology: Results for orders placed or performed during the hospital encounter of 06/21/21  Resp Panel by RT-PCR (Flu A&B, Covid) Nasopharyngeal Swab     Status: None   Collection Time: 06/21/21  5:55 PM   Specimen: Nasopharyngeal Swab; Nasopharyngeal(NP) swabs in vial transport medium  Result Value Ref Range Status   SARS Coronavirus 2 by RT PCR NEGATIVE  NEGATIVE Final    Comment: (NOTE) SARS-CoV-2 target nucleic acids are NOT DETECTED.  The SARS-CoV-2 RNA is generally detectable in upper respiratory specimens during the acute phase of infection. The lowest concentration of SARS-CoV-2 viral copies this assay can detect is 138 copies/mL. A negative result does not preclude SARS-Cov-2 infection and should not be used as the sole basis for treatment or other patient management decisions. A negative result may occur with  improper specimen collection/handling, submission of specimen other than nasopharyngeal swab, presence of viral mutation(s) within the areas targeted by this assay, and inadequate number of viral copies(<138 copies/mL). A negative result must be combined with clinical observations, patient  history, and epidemiological information. The expected result is Negative.  Fact Sheet for Patients:  BloggerCourse.com  Fact Sheet for Healthcare Providers:  SeriousBroker.it  This test is no t yet approved or cleared by the [place] FDA and  has been authorized for detection and/or diagnosis of SARS-CoV-2 by FDA under an Emergency Use Authorization (EUA). This EUA will remain  in effect (meaning this test can be used) for the duration of the COVID-19 declaration under Section 564(b)(1) of the Act, 21 U.S.C.section 360bbb-3(b)(1), unless the authorization is terminated  or revoked sooner.       Influenza A by PCR NEGATIVE NEGATIVE Final   Influenza B by PCR NEGATIVE NEGATIVE Final    Comment: (NOTE) The Xpert Xpress SARS-CoV-2/FLU/RSV plus assay is intended as an aid in the diagnosis of influenza from Nasopharyngeal swab specimens and should not be used as a sole basis for treatment. Nasal washings and aspirates are unacceptable for Xpert Xpress SARS-CoV-2/FLU/RSV testing.  Fact Sheet for Patients: BloggerCourse.com  Fact Sheet for Healthcare Providers: SeriousBroker.it  This test is not yet approved or cleared by the [place] FDA and has been authorized for detection and/or diagnosis of SARS-CoV-2 by FDA under an Emergency Use Authorization (EUA). This EUA will remain in effect (meaning this test can be used) for the duration of the COVID-19 declaration under Section 564(b)(1) of the Act, 21 U.S.C. section 360bbb-3(b)(1), unless the authorization is terminated or revoked.  Performed at [facility], [address] [place], [place] [address]   Surgical pcr screen     Status: Abnormal   Collection Time: 06/23/21  8:35 AM   Specimen: Nasal Mucosa; Nasal Swab  Result Value Ref Range Status   MRSA, PCR POSITIVE (A) NEGATIVE Final    Comment: RESULT  CALLED TO, READ BACK BY AND VERIFIED WITH: AMANDA MCCLOCKLEN 06/23/21 1006 MW    Staphylococcus aureus POSITIVE (A) NEGATIVE Final    Comment: (NOTE) The Xpert SA Assay (FDA approved for NASAL specimens in patients 38 years of age and older), is one component of a comprehensive surveillance program. It is not intended to diagnose infection nor to guide or monitor treatment. Performed at [facility], [address] [place], [place] [address]   Resp Panel by RT-PCR (Flu A&B, Covid) Nasopharyngeal Swab     Status: None   Collection Time: 06/26/21  5:05 PM   Specimen: Nasopharyngeal Swab; Nasopharyngeal(NP) swabs in vial transport medium  Result Value Ref Range Status   SARS Coronavirus 2 by RT PCR NEGATIVE NEGATIVE Final    Comment: (NOTE) SARS-CoV-2 target nucleic acids are NOT DETECTED.  The SARS-CoV-2 RNA is generally detectable in upper respiratory specimens during the acute phase of infection. The lowest concentration of SARS-CoV-2 viral copies this assay can detect is 138 copies/mL. A negative result does not preclude SARS-Cov-2  infection and should not be used as the sole basis for treatment or other patient management decisions. A negative result may occur with  improper specimen collection/handling, submission of specimen other than nasopharyngeal swab, presence of viral mutation(s) within the areas targeted by this assay, and inadequate number of viral copies(<138 copies/mL). A negative result must be combined with clinical observations, patient history, and epidemiological information. The expected result is Negative.  Fact Sheet for Patients:  BloggerCourse.com  Fact Sheet for Healthcare Providers:  SeriousBroker.it  This test is no t yet approved or cleared by the [place] FDA and  has been authorized for detection and/or diagnosis of SARS-CoV-2 by FDA under an Emergency Use Authorization (EUA). This  EUA will remain  in effect (meaning this test can be used) for the duration of the COVID-19 declaration under Section 564(b)(1) of the Act, 21 U.S.C.section 360bbb-3(b)(1), unless the authorization is terminated  or revoked sooner.       Influenza A by PCR NEGATIVE NEGATIVE Final   Influenza B by PCR NEGATIVE NEGATIVE Final    Comment: (NOTE) The Xpert Xpress SARS-CoV-2/FLU/RSV plus assay is intended as an aid in the diagnosis of influenza from Nasopharyngeal swab specimens and should not be used as a sole basis for treatment. Nasal washings and aspirates are unacceptable for Xpert Xpress SARS-CoV-2/FLU/RSV testing.  Fact Sheet for Patients: BloggerCourse.com  Fact Sheet for Healthcare Providers: SeriousBroker.it  This test is not yet approved or cleared by the [place] FDA and has been authorized for detection and/or diagnosis of SARS-CoV-2 by FDA under an Emergency Use Authorization (EUA). This EUA will remain in effect (meaning this test can be used) for the duration of the COVID-19 declaration under Section 564(b)(1) of the Act, 21 U.S.C. section 360bbb-3(b)(1), unless the authorization is terminated or revoked.  Performed at [facility], [address] [place], [place] [address]    *Note: Due to a large number of results and/or encounters for the requested time period, some results have not been displayed. A complete set of results can be found in Results Review.    Labs: CBC: Recent Labs  Lab 06/21/21 1738 06/21/21 2142 06/22/21 0416 06/23/21 0405 06/23/21 1257 06/24/21 0513 06/25/21 0509 06/26/21 0622  WBC 9.7 9.2   < > 7.7 7.3 5.9 6.6 5.9  NEUTROABS 8.7* 8.2*  --   --   --   --   --   --   HGB 11.5* 11.5*   < > 9.2* 9.3* 8.9* 8.2* 8.4*  HCT 36.6* 36.2*   < > 29.0* 29.7* 27.5* 25.1* 26.8*  MCV 93.6 93.1   < > 90.9 92.5 91.7 89.6 92.4  PLT 253 252   < > 201 200 199 204 222   < > = values in  this interval not displayed.   Basic Metabolic Panel: Recent Labs  Lab 06/21/21 1738 06/21/21 2142 06/23/21 0405 06/24/21 0513 06/25/21 0509  NA 136 136 135 136 138  K 4.6 4.4 4.1 4.0 3.9  CL 102 100 105 104 106  CO2 28 27 27 25 27   GLUCOSE 123* 129* 99 154* 130*  BUN 15 17 19 19 23   CREATININE 0.85 0.89 0.97 0.77 1.05  CALCIUM 9.2 9.0 8.3* 8.2* 8.3*   Liver Function Tests: Recent Labs  Lab 06/21/21 2142  AST 21  ALT <5  ALKPHOS 78  BILITOT 1.5*  PROT 7.1  ALBUMIN 3.6     Discharge time spent: greater than 30 minutes.  Signed: Alford Highland,  MD [facility] 06/27/2021

## 2021-06-27 NOTE — Progress Notes (Signed)
Occupational Therapy Treatment Patient Details Name: Gregory [name] MRN: [id number] DOB: 1941-04-21 Today's Date: 06/27/2021   History of present illness Pt is an 81 y/o M admitted on 06/21/21 with c/o falling & landing on L hip. Pt found to have L hip intertrochanteric fx & underwent IM nail. PMH: AAA, a-fib, heart disease, bilateral carotid artery stenosis, CAD, dementia, HLD, parkinsonism   OT comments  Pt reported being hungry upon OT arrival.  Pt still had food from breakfast tray.  OT assisted pt to EOB in prep for transfer to recliner to continue eating.  Pt washed face while dangling feet EOB, intermittent mod A to reposition pt in midline sitting.  Attempted walker use for sit to stand but pt did not respond well to cues for forward lean and knee extension to achieve full standing posture.  Transitioned to squat pivot transfer, requiring total assist of 1 person.  OT assisted pt with self feeding from breakfast tray, with pt noted to have increased spilling and dropping of utensils this day as compared to yesterday with same therapist.  Max A to keep food on utensil and bring utensil to mouth.  LUE dyskinesia also contributing to increased spilling with cup which had lid.  Mod A to steady cup to mouth.  OT assisted to reposition pt in recliner with legs elevated and back rest reclined per pt request.  Chair alarm set and phone/call light placed within reach, pt with all needs met.  Will continue to follow for ADL/fxl transfer training in the acute setting in order to reduce physical burden on caregivers upon d/c.   Recommendations for follow up therapy are one component of a multi-disciplinary discharge planning process, led by the attending physician.  Recommendations may be updated based on patient status, additional functional criteria and insurance authorization.    Follow Up Recommendations  Skilled nursing-short term rehab (<3 hours/day)        Patient can return home with the  following  Two people to help with walking and/or transfers;A lot of help with bathing/dressing/bathroom;Assistance with cooking/housework;Help with stairs or ramp for entrance;Assist for transportation   Equipment Recommendations  Other (comment) (defer to next venue of care)    Recommendations for Other Services      Precautions / Restrictions Precautions Precautions: Fall Restrictions Weight Bearing Restrictions: Yes LLE Weight Bearing: Weight bearing as tolerated Other Position/Activity Restrictions: Pt tends to slide in chair (OT left pt with feet elevated in recliner and chair alarm on)       Mobility Bed Mobility Overal bed mobility: Needs Assistance Bed Mobility: Supine to Sit     Supine to sit: Max assist     General bed mobility comments: strong R lateral and posterior lean Patient Response: Cooperative  Transfers Overall transfer level: Needs assistance Equipment used: Rolling walker (2 wheels) Transfers: Sit to/from Stand, Bed to chair/wheelchair/BSC Sit to Stand: Total assist   Squat pivot transfers: Total assist       General transfer comment: attempted sit to stand with walker from elevated bed, but unsuccessful.  Transitioned to squat pivot with gait belt and 1 person total assist.     Balance Overall balance assessment: Needs assistance Sitting-balance support: Feet supported, Single extremity supported Sitting balance-Leahy Scale: Fair Sitting balance - Comments: max A to position pt in midline but pt does not maintain this position Postural control: Posterior lean, Right lateral lean Standing balance support: Bilateral upper extremity supported Standing balance-Leahy Scale: Zero Standing balance comment: attempted sit to stand  with pt keeping knees significantly flexed despite cues for extension and forward weight shifting; pt did not reach full standing posture today and instead performed squat pivot with OT                            ADL either performed or assessed with clinical judgement   ADL Overall ADL's : Needs assistance/impaired Eating/Feeding: Maximal assistance Eating/Feeding Details (indicate cue type and reason): increased dropping of utensil and water cup today with decreased attention to items in hand, as well as spilling of food related to dyskinesias in LUE Grooming: Wash/dry face;Set up;Sitting Grooming Details (indicate cue type and reason): while sitting EOB                             Functional mobility during ADLs: Total assistance General ADL Comments: 1 person total assist for scoot pivot bed<>recliner    Extremity/Trunk Assessment Upper Extremity Assessment Upper Extremity Assessment: Generalized weakness   Lower Extremity Assessment Lower Extremity Assessment: Generalized weakness   Cervical / Trunk Assessment Cervical / Trunk Assessment: Kyphotic                      Cognition Arousal/Alertness: Awake/alert Behavior During Therapy: WFL for tasks assessed/performed Overall Cognitive Status: History of cognitive impairments - at baseline                                                       General Comments Pt incontinent of urine and BM upon arrival    Pertinent Vitals/ Pain       Pain Assessment Pain Assessment: Faces Faces Pain Scale: Hurts little more Breathing: normal Negative Vocalization: none Facial Expression: facial grimacing Body Language: tense, distressed pacing, fidgeting Consolability: no need to console PAINAD Score: 3 Pain Location: Pt points to the L hip Pain Descriptors / Indicators: Discomfort, Grimacing Pain Intervention(s): Limited activity within patient's tolerance, Monitored during session, Repositioned                                                          Frequency  Min 3X/week        Progress Toward Goals  OT Goals(current goals can now be found in the care plan  section)  Progress towards OT goals: Progressing toward goals  Acute Rehab OT Goals Patient Stated Goal: to go home OT Goal Formulation: With patient Time For Goal Achievement: 07/08/21 Potential to Achieve Goals: Good  Plan Discharge plan remains appropriate                     AM-PAC OT "6 Clicks" Daily Activity     Outcome Measure   Help from another person eating meals?: A Lot Help from another person taking care of personal grooming?: A Lot Help from another person toileting, which includes using toliet, bedpan, or urinal?: A Lot Help from another person bathing (including washing, rinsing, drying)?: A Lot Help from another person to put on and taking off regular upper body clothing?: A Lot Help from another person  to put on and taking off regular lower body clothing?: Total 6 Click Score: 11    End of Session Equipment Utilized During Treatment: Gait belt;Rolling walker (2 wheels)  OT Visit Diagnosis: History of falling (Z91.81);Other abnormalities of gait and mobility (R26.89)   Activity Tolerance Patient tolerated treatment well   Patient Left in chair;with call bell/phone within reach;with chair alarm set   Nurse Communication Other (comment) (notified NT of pt's condom cath coming out during sit to stand, which NT replaced during session)        Time: 1194-1740 OT Time Calculation (min): 42 min  Charges: OT General Charges $OT Visit: 1 Visit OT Treatments $Self Care/Home Management : 38-52 mins  Leta Speller, MS, OTR/L   Darleene Cleaver 06/27/2021, 12:06 PM

## 2021-06-27 NOTE — Progress Notes (Signed)
Physical Therapy Treatment Patient Details Name: Gregory [name] MRN: [id number] DOB: 01-Dec-1940 Today's Date: 06/27/2021   History of Present Illness Pt is an 81 y/o M admitted on 06/21/21 with c/o falling & landing on L hip. Pt found to have L hip intertrochanteric fx & underwent IM nail. PMH: AAA, a-fib, heart disease, bilateral carotid artery stenosis, CAD, dementia, HLD, parkinsonism    PT Comments    Pt seen this am for rolling and bed mobility to assist with hygiene incontinence. Pt requires MaxA for bed mobility and had placement on railing to assist with rolling. Continued c/o of L hip pain limiting tolerance for mobility. Pt resistive to L LE ROM at times due to fear of increased pain level. Pt encouraged to keep eyes open during session to improve awareness and task engagement. Continue to recommend SNF once medically stable for d/c, however pt's wife is hoping to arrange additional equipment and assist for home discharge.  Will continue PT this pm.   Recommendations for follow up therapy are one component of a multi-disciplinary discharge planning process, led by the attending physician.  Recommendations may be updated based on patient status, additional functional criteria and insurance authorization.  Follow Up Recommendations  Skilled nursing-short term rehab (<3 hours/day)     Assistance Recommended at Discharge Frequent or constant Supervision/Assistance  Patient can return home with the following Two people to help with walking and/or transfers;Two people to help with bathing/dressing/bathroom;Direct supervision/assist for medications management;Help with stairs or ramp for entrance;Assistance with feeding;Assist for transportation;Direct supervision/assist for financial management;Assistance with cooking/housework   Equipment Recommendations  Other (comment) (Pt will need additional equipment if decision is for d/c home)    Recommendations for Other Services        Precautions / Restrictions Precautions Precautions: Fall Restrictions Weight Bearing Restrictions: Yes LLE Weight Bearing: Weight bearing as tolerated     Mobility  Bed Mobility Overal bed mobility: Needs Assistance Bed Mobility: Sit to Supine Rolling: Max assist   Supine to sit: Max assist Sit to supine: +2 for physical assistance, Min assist   General bed mobility comments:  (Repeated vc's for desired task due to impaired cognitive status)    Transfers                   General transfer comment:  (deferred this am due to c/o pain)    Ambulation/Gait                   Stairs             Wheelchair Mobility    Modified Rankin (Stroke Patients Only)       Balance                                            Cognition Arousal/Alertness: Awake/alert Behavior During Therapy: WFL for tasks assessed/performed Overall Cognitive Status: History of cognitive impairments - at baseline                                          Exercises General Exercises - Lower Extremity Ankle Circles/Pumps: AAROM, Both, 10 reps Heel Slides: AAROM, Both, 5 reps Hip ABduction/ADduction: Both, 5 reps    General Comments General comments (skin integrity, edema, etc.): Pt incontinent of urine and  BM upon arrival      Pertinent Vitals/Pain Pain Assessment Pain Assessment: Faces Faces Pain Scale: Hurts little more Pain Location: Pt points to the L hip Pain Descriptors / Indicators: Discomfort, Grimacing Pain Intervention(s): Limited activity within patient's tolerance, Monitored during session, Repositioned    Home Living                          Prior Function            PT Goals (current goals can now be found in the care plan section) Acute Rehab PT Goals Patient Stated Goal: none stated    Frequency    BID      PT Plan Current plan remains appropriate    Co-evaluation               AM-PAC PT "6 Clicks" Mobility   Outcome Measure  Help needed turning from your back to your side while in a flat bed without using bedrails?: A Lot Help needed moving from lying on your back to sitting on the side of a flat bed without using bedrails?: A Lot Help needed moving to and from a bed to a chair (including a wheelchair)?: Total Help needed standing up from a chair using your arms (e.g., wheelchair or bedside chair)?: A Lot Help needed to walk in hospital room?: Total Help needed climbing 3-5 steps with a railing? : Total 6 Click Score: 9    End of Session Equipment Utilized During Treatment: Oxygen Activity Tolerance: Patient limited by fatigue;Patient limited by pain Patient left: in bed;with call bell/phone within reach;with bed alarm set Nurse Communication: Mobility status PT Visit Diagnosis: Unsteadiness on feet (R26.81);Muscle weakness (generalized) (M62.81);Difficulty in walking, not elsewhere classified (R26.2)     Time: 0930-1010 PT Time Calculation (min) (ACUTE ONLY): 40 min  Charges:  $Therapeutic Exercise: 8-22 mins $Therapeutic Activity: 23-37 mins                    Zadie Cleverly, PTA    Jannet Askew 06/27/2021, 11:05 AM

## 2021-06-27 NOTE — TOC Progression Note (Addendum)
Transition of Care [facility]) - Progression Note    Patient Details  Name: Gregory [name] MRN: [id number] Date of Birth: [birth year]  Transition [facility] Care [facility]) CM/SW Contact  Marlowe Sax, RN Phone Number: 06/27/2021, 12:15 PM  Clinical Narrative:     Received notification that the ins Berkley Harvey was approved, Auth ID [id number], I notified Rowland Lathe agrees that the patient will Be able to get a nebulizer treatment, called the wife [name] to let he know that he will dc today   Expected Discharge Plan: Skilled Nursing Facility Barriers to Discharge: Continued Medical Work up  Expected Discharge Plan and Services Expected Discharge Plan: Skilled Nursing Facility In-house Referral: Clinical Social Work     Living arrangements for the past 2 months: Single Family Home Expected Discharge Date: 06/27/21                                     Social Determinants of Health (SDOH) Interventions    Readmission Risk Interventions No flowsheet data found.

## 2021-06-27 NOTE — TOC Progression Note (Addendum)
Transition of Care [facility]) - Progression Note    Patient Details  Name: Gregory [name] MRN: [id number] Date of Birth: Oct 06, 1940  Transition of Care [facility]) CM/SW Contact  Marlowe Sax, RN Phone Number: 06/27/2021, 1:28 PM  Clinical Narrative:   Requested that the nurse call report to [facility], [phone], Awaiting  Verlon Au at [facility] to call back with a room number  after she returns from lunch to call EMS Going to room 506 called EMS there are 4 ahead of him   Expected Discharge Plan: Skilled Nursing Facility Barriers to Discharge: Continued Medical Work up  Expected Discharge Plan and Services Expected Discharge Plan: Skilled Nursing Facility In-house Referral: Clinical Social Work     Living arrangements for the past 2 months: Single Family Home Expected Discharge Date: 06/27/21                                     Social Determinants of Health (SDOH) Interventions    Readmission Risk Interventions No flowsheet data found.

## 2021-06-29 ENCOUNTER — Encounter: Payer: Self-pay | Admitting: Cardiology

## 2021-06-29 ENCOUNTER — Encounter: Payer: Self-pay | Admitting: Neurology

## 2021-06-29 NOTE — Telephone Encounter (Signed)
From patient.

## 2021-07-13 ENCOUNTER — Emergency Department
Admission: EM | Admit: 2021-07-13 | Discharge: 2021-07-13 | Disposition: A | Discharge status: Home / Self Care | Payer: Medicare Other | Attending: Emergency Medicine | Admitting: Emergency Medicine

## 2021-07-13 DIAGNOSIS — E039 Hypothyroidism, unspecified: Secondary | ICD-10-CM | POA: Insufficient documentation

## 2021-07-13 DIAGNOSIS — R001 Bradycardia, unspecified: Secondary | ICD-10-CM | POA: Insufficient documentation

## 2021-07-13 DIAGNOSIS — Z79899 Other long term (current) drug therapy: Secondary | ICD-10-CM | POA: Insufficient documentation

## 2021-07-13 DIAGNOSIS — G20 Parkinson's disease: Secondary | ICD-10-CM | POA: Insufficient documentation

## 2021-07-13 DIAGNOSIS — I2510 Atherosclerotic heart disease of native coronary artery without angina pectoris: Secondary | ICD-10-CM | POA: Insufficient documentation

## 2021-07-13 DIAGNOSIS — F0390 Unspecified dementia without behavioral disturbance: Secondary | ICD-10-CM | POA: Insufficient documentation

## 2021-07-13 LAB — COMPREHENSIVE METABOLIC PANEL
ALT: <5 U/L (ref 0–44)
AST: 12 U/L — ABNORMAL LOW (ref 15–41)
Albumin: 3.6 g/dL (ref 3.5–5.0)
Alkaline Phosphatase: 173 U/L — ABNORMAL HIGH (ref 38–126)
Anion gap: 11 (ref 5–15)
BUN: 14 mg/dL (ref 8–23)
CO2: 28 mmol/L (ref 22–32)
Calcium: 9.1 mg/dL (ref 8.9–10.3)
Chloride: 101 mmol/L (ref 98–111)
Creatinine, Ser: 0.88 mg/dL (ref 0.61–1.24)
GFR, Estimated: >60 mL/min (ref >60)
Glucose, Bld: 100 mg/dL — ABNORMAL HIGH (ref 70–99)
Potassium: 4.2 mmol/L (ref 3.5–5.1)
Sodium: 140 mmol/L (ref 135–145)
Total Bilirubin: 1.4 mg/dL — ABNORMAL HIGH (ref 0.3–1.2)
Total Protein: 7.4 g/dL (ref 6.5–8.1)

## 2021-07-13 LAB — CBC WITH DIFFERENTIAL/PLATELET
Abs Immature Granulocytes: 0.02 10*3/uL (ref 0.00–0.07)
Basophils Absolute: 0 10*3/uL (ref 0.0–0.1)
Basophils Relative: 0 %
Eosinophils Absolute: 0.2 10*3/uL (ref 0.0–0.5)
Eosinophils Relative: 3 %
HCT: 37.1 % — ABNORMAL LOW (ref 39.0–52.0)
Hemoglobin: 11 g/dL — ABNORMAL LOW (ref 13.0–17.0)
Immature Granulocytes: 0 %
Lymphocytes Relative: 11 %
Lymphs Abs: 0.7 10*3/uL (ref 0.7–4.0)
MCH: 28 pg (ref 26.0–34.0)
MCHC: 29.6 g/dL — ABNORMAL LOW (ref 30.0–36.0)
MCV: 94.4 fL (ref 80.0–100.0)
Monocytes Absolute: 0.4 10*3/uL (ref 0.1–1.0)
Monocytes Relative: 5 %
Neutro Abs: 5.4 10*3/uL (ref 1.7–7.7)
Neutrophils Relative %: 81 %
Platelets: 425 10*3/uL — ABNORMAL HIGH (ref 150–400)
RBC: 3.93 MIL/uL — ABNORMAL LOW (ref 4.22–5.81)
RDW: 15.9 % — ABNORMAL HIGH (ref 11.5–15.5)
WBC: 6.7 10*3/uL (ref 4.0–10.5)
nRBC: 0 % (ref 0.0–0.2)

## 2021-07-13 LAB — TSH: TSH: 3.387 u[IU]/mL (ref 0.350–4.500)

## 2021-07-13 LAB — MAGNESIUM: Magnesium: 2.1 mg/dL (ref 1.7–2.4)

## 2021-07-13 LAB — TROPONIN I (HIGH SENSITIVITY): Troponin I (High Sensitivity): 13 ng/L (ref <18)

## 2021-07-13 MED ORDER — SODIUM CHLORIDE 0.9 % IV BOLUS
1000.0000 mL | Freq: Once | INTRAVENOUS | Status: AC
Start: 2021-07-13 — End: 2021-07-13
  Administered 2021-07-13: 1000 mL via INTRAVENOUS

## 2021-07-13 NOTE — ED Notes (Signed)
REPORT GIVEN ANGELA RN ALL QUESTIONS ANSWERED
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
## 2021-07-13 NOTE — ED Triage Notes (Signed)
Hr 40's, hypotensive per , [facility]

## 2021-07-13 NOTE — ED Notes (Signed)
Called [facility] for transport to [facility] @ 15:22pm

## 2021-07-13 NOTE — Discharge Instructions (Signed)
The patient had sinus bradycardia.  His blood pressure was at his baseline.  His blood work was all reassuring including his kidney function and electrolytes.

## 2021-08-01 ENCOUNTER — Other Ambulatory Visit: Payer: Self-pay | Admitting: Neurology

## 2021-10-03 ENCOUNTER — Telehealth: Payer: Self-pay | Admitting: Neurology

## 2021-10-03 NOTE — Telephone Encounter (Signed)
[facility]) called, Can we decrease his carbidopa-levodopa (SINEMET CR) 50-200 MG tablet since he has lost weigh. Would like a call from the nurse. ?

## 2021-10-04 NOTE — Telephone Encounter (Signed)
LMVM for Mitzi returning call. (2nd call). ?

## 2021-10-04 NOTE — Telephone Encounter (Signed)
I called Gregory [name] with [facility] and LMVM for her to return call about decreasing of sinemet.   ?

## 2021-10-17 NOTE — Telephone Encounter (Signed)
Thank you for the notification.  Okay to maintain on Sinemet IR 25-100 mg strength 1 pill 4 times daily for now.

## 2021-10-17 NOTE — Telephone Encounter (Signed)
I called Mitzi with [facility].  Pt under hospice since March 2023.  Since hardtime taking pills they had IR tabs at home and he was transitioned to these. He has been taking sinemet 25//100 tabs QID and is managed well at this time.  They will keep on these if that is ok and will refill as needed.  They wanted [place] to be made aware and please call back if any changes.

## 2021-10-17 NOTE — Telephone Encounter (Signed)
Would like a call from the nurse to finish conversation.

## 2021-10-17 NOTE — Telephone Encounter (Signed)
Spoke with Mitzi. She states the wife is asking about pt changing his dosage due to weight loss. She is going to get more info to see if pt is well managed on his current dose and schedule. Her questions were answered. She will call us back.

## 2021-10-17 NOTE — Telephone Encounter (Signed)
Noted, thank you

## 2021-10-17 NOTE — Telephone Encounter (Signed)
Clarification they told me 5 times daily of the 25/100g tab IR sinemet.

## 2021-10-17 NOTE — Telephone Encounter (Signed)
[facility]) returning phone call, would like a call back as soon as possible.

## 2022-02-11 ENCOUNTER — Other Ambulatory Visit: Payer: Self-pay | Admitting: Cardiology

## 2022-02-11 DIAGNOSIS — I480 Paroxysmal atrial fibrillation: Secondary | ICD-10-CM

## 2022-02-12 ENCOUNTER — Other Ambulatory Visit: Payer: Self-pay | Admitting: Cardiology

## 2022-02-12 DIAGNOSIS — I480 Paroxysmal atrial fibrillation: Secondary | ICD-10-CM

## 2022-06-27 ENCOUNTER — Telehealth: Payer: Self-pay | Admitting: Neurology

## 2022-06-27 NOTE — Telephone Encounter (Signed)
I am sorry to hear, that he is taking only minimal PO and is bedbound.  May reduce levodopa to half a pill 5 times a day, may crush and give with applesauce if possible. If there is a need to reduce further, may reduce to half a pill 3 times a day in about a week or so.  May have to adjust dosing according to response, if stiffness and tremor increase, may be able to increase or if there are no repercussions after reducing the dose, may also be completely phased out, hospice MD may have to make a decision.

## 2022-06-27 NOTE — Telephone Encounter (Signed)
LMVM for April with [facility], that returned her call.

## 2022-06-27 NOTE — Telephone Encounter (Signed)
Hospice RN April is asking for a call to discuss pt's carbidopa-levodopa

## 2022-06-27 NOTE — Telephone Encounter (Signed)
April called back.  Pt is now bedbound takes bites and sips (being fed).  (At about 20%).  Asking recommendation relating to weaning or reducing doses of sinemet 25/100mg  tablets from (1 tablet 5 times daily).    Please advise.

## 2022-06-28 NOTE — Telephone Encounter (Signed)
As indicated previously, okay to titrate down as needed on the C/L as the situation permits/warrants, given his condition and day to day fluctuation. Okay to give 1 pill tid for now and reduce further if needed.

## 2022-06-28 NOTE — Telephone Encounter (Signed)
Spoke with April RN w/ hospice and informed her of Dr Guadelupe Sabin recommendations. April said the pt's wife's concern is that patient isn't really awake enough to take the medication 5x per day. Some days he doesn't get it that often. He sleeps about 21-22 hours per day. Pt's wife feels bad like she's giving him medication all the time. April wonders if Dr Rexene Alberts would be ok with 1 pill three times daily for now? Patient does have stiffness but no tremors currently.

## 2022-06-28 NOTE — Telephone Encounter (Signed)
I called April back and LVM (ok per April) with instructions from Dr Rexene Alberts. Advised it is ok to give 1 pill TID for now and reduce further if needed as the situation permits/warrants, given his condition and day to day fluctuation. Left office number for call back if needed.

## 2022-07-20 DEATH — deceased
